# Patient Record
Sex: Male | Born: 1945 | Race: White | Hispanic: No | Marital: Single | Smoking: Former smoker
Health system: Southern US, Community
[De-identification: ages and names within clinical notes are randomized; demographics above are authoritative.]

## PROBLEM LIST (undated history)

## (undated) DIAGNOSIS — K219 Gastro-esophageal reflux disease without esophagitis: Secondary | ICD-10-CM

## (undated) DIAGNOSIS — R188 Other ascites: Secondary | ICD-10-CM

## (undated) DIAGNOSIS — I214 Non-ST elevation (NSTEMI) myocardial infarction: Secondary | ICD-10-CM

## (undated) DIAGNOSIS — I639 Cerebral infarction, unspecified: Secondary | ICD-10-CM

## (undated) DIAGNOSIS — I10 Essential (primary) hypertension: Secondary | ICD-10-CM

## (undated) DIAGNOSIS — Z8489 Family history of other specified conditions: Secondary | ICD-10-CM

## (undated) DIAGNOSIS — N529 Male erectile dysfunction, unspecified: Secondary | ICD-10-CM

## (undated) DIAGNOSIS — E7800 Pure hypercholesterolemia, unspecified: Secondary | ICD-10-CM

## (undated) DIAGNOSIS — J449 Chronic obstructive pulmonary disease, unspecified: Secondary | ICD-10-CM

## (undated) DIAGNOSIS — K7460 Unspecified cirrhosis of liver: Secondary | ICD-10-CM

## (undated) DIAGNOSIS — T7840XA Allergy, unspecified, initial encounter: Secondary | ICD-10-CM

## (undated) DIAGNOSIS — R7301 Impaired fasting glucose: Secondary | ICD-10-CM

## (undated) DIAGNOSIS — J189 Pneumonia, unspecified organism: Secondary | ICD-10-CM

## (undated) HISTORY — DX: Impaired fasting glucose: R73.01

## (undated) HISTORY — DX: Cerebral infarction, unspecified: I63.9

## (undated) HISTORY — DX: Allergy, unspecified, initial encounter: T78.40XA

## (undated) HISTORY — PX: BACK SURGERY: SHX140

## (undated) HISTORY — DX: Male erectile dysfunction, unspecified: N52.9

## (undated) HISTORY — DX: Essential (primary) hypertension: I10

## (undated) HISTORY — PX: APPENDECTOMY: SHX54

---

## 2007-10-27 HISTORY — PX: COLONOSCOPY: SHX174

## 2009-02-23 LAB — HM COLONOSCOPY

## 2010-10-26 DIAGNOSIS — I639 Cerebral infarction, unspecified: Secondary | ICD-10-CM

## 2010-10-26 HISTORY — DX: Cerebral infarction, unspecified: I63.9

## 2011-10-14 ENCOUNTER — Ambulatory Visit (HOSPITAL_COMMUNITY)
Admission: RE | Admit: 2011-10-14 | Discharge: 2011-10-14 | Disposition: A | Discharge status: Home / Self Care | Payer: PRIVATE HEALTH INSURANCE | Source: Ambulatory Visit | Attending: Family Medicine | Admitting: Family Medicine

## 2011-10-14 DIAGNOSIS — IMO0001 Reserved for inherently not codable concepts without codable children: Secondary | ICD-10-CM | POA: Insufficient documentation

## 2011-10-14 DIAGNOSIS — R269 Unspecified abnormalities of gait and mobility: Secondary | ICD-10-CM | POA: Insufficient documentation

## 2011-10-14 DIAGNOSIS — M6281 Muscle weakness (generalized): Secondary | ICD-10-CM | POA: Insufficient documentation

## 2011-10-14 NOTE — Progress Notes (Signed)
Physical Therapy Evaluation  Patient Details  Name: Mitchell Oliver MRN: [id number] Date of Birth: 10-16-1946  Today's Date: 10/14/2011 Time: 1015-1038 Time Calculation (min): 23 min Visit#: 1  of 1   Re-eval:      Past Medical History: No past medical history on file. Past Surgical History: No past surgical history on file.  Subjective Symptoms/Limitations Symptoms: The patient had a stroke on Dec. 6th.  The patient states that he was in the hospital for one night.  The patient states that he is not having any difficulty doing anything at  home. How long can you sit comfortably?: no problem How long can you stand comfortably?: no problem How long can you walk comfortably?: no problem Pain Assessment Currently in Pain?: No/denies  Precautions/Restrictions     Prior Functioning  Prior Function Able to Take Stairs?: Reciprically Driving: Yes Vocation: Full time employment Vocation Requirements: sales travels  Sensation/Coordination/Flexibility    Assessment RLE Strength Right Hip Flexion: 5/5 Right Hip Extension: 5/5 Right Hip ABduction: 5/5 Right Hip ADduction: 5/5 Right Knee Flexion: 5/5 Right Knee Extension: 5/5 Right Ankle Dorsiflexion: 5/5 LLE Strength Left Hip Flexion: 5/5 Left Hip Extension: 5/5 Left Hip ABduction: 5/5 Left Hip ADduction: 5/5 Left Knee Flexion: 5/5 Left Knee Extension: 5/5 Left Ankle Dorsiflexion: 5/5     Physical Therapy Assessment and Plan PT Assessment and Plan Clinical Impression Statement: S/P CVA who does have some high balance deficits.  Pt will do well with HEP for improving balance PT Treatment/Interventions: Patient/family education PT Plan: Pt given high level home exercies program to improve balance.  Pt was instructed that if he did not see any improvement or had any questions he was to call the clinic and we would re-assess his HEP.      Problem List There is no problem list on file for this patient.   PT - End of  Session Activity Tolerance: Patient tolerated treatment well General Behavior During Session: [facility] for tasks performed   RUSSELL,CINDY 10/14/2011, 10:39 AM  Physician Documentation Your signature is required to indicate approval of the treatment plan as stated above.  Please sign and either send electronically or make a copy of this report for your files and return this physician signed original.   Please mark one 1.__approve of plan  2. ___approve of plan with the following conditions.   ______________________________                                                          _____________________ Physician Signature                                                                                                             Date

## 2011-10-14 NOTE — Patient Instructions (Addendum)
Balance

## 2011-11-05 ENCOUNTER — Ambulatory Visit: Payer: PRIVATE HEALTH INSURANCE | Attending: Family Medicine | Admitting: Sleep Medicine

## 2011-11-05 DIAGNOSIS — Z6832 Body mass index (BMI) 32.0-32.9, adult: Secondary | ICD-10-CM | POA: Insufficient documentation

## 2011-11-05 DIAGNOSIS — G4710 Hypersomnia, unspecified: Secondary | ICD-10-CM | POA: Insufficient documentation

## 2011-11-05 DIAGNOSIS — G4700 Insomnia, unspecified: Secondary | ICD-10-CM

## 2011-11-06 ENCOUNTER — Encounter: Payer: Self-pay | Admitting: Sleep Medicine

## 2011-11-09 NOTE — Procedures (Signed)
NAME:  ATHARVA, MIRSKY              ACCOUNT NO.:  [id number]  MEDICAL RECORD NO.:  [id number]          PATIENT TYPE:  OUT  LOCATION:  SLEEP LAB                     FACILITY:  [facility]  PHYSICIAN:  Kimm Sider A. Gerilyn Pilgrim, M.D. DATE OF BIRTH:  Jan 22, 1946  DATE OF STUDY:  11/05/2011                           NOCTURNAL POLYSOMNOGRAM  REFERRING PHYSICIAN:  Laural Golden  INDICATIONS:  A 66 year old man, who presents with hypersomnia, fatigue, snoring, and witnessed apnea.  The study is being done to evaluate for obstructive sleep apnea syndrome.  MEDICATIONS: 1. Benazepril. 2. Lipitor. 3. Norvasc. 4. Toprol.  EPWORTH SLEEPINESS SCALE: 1. BMI 32.  ARCHITECTURAL SUMMARY:  The total recording time is 439 minutes.  Sleep efficiency is 71%.  Sleep latency 16 minutes.  REM latency 192 minutes. Stage N1 22%, N2 45%, N3 80%, and REM sleep 25%.  RESPIRATORY SUMMARY:  Baseline oxygen saturation is 96, lowest saturation is 90 during non-REM sleep.  Diagnostic AHI is 3.5 and RDI is 4.8.  LIMB MOVEMENT SUMMARY:  PLM index 25.  ELECTROCARDIOGRAM SUMMARY:  Average heart rate is 61.  There is no significant dysrhythmias observed.  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 Otherwise, unremarkable study.  Thanks for this referral.   Markham Dumlao A. Gerilyn Pilgrim, M.D.    KAD/MEDQ  D:  11/08/2011 10:18:46  T:  11/09/2011 05:44:17  Job:  [id number]

## 2013-01-20 ENCOUNTER — Other Ambulatory Visit: Payer: Self-pay | Admitting: Family Medicine

## 2013-01-20 LAB — LIPID PANEL
Cholesterol: 116 mg/dL (ref 0–200)
Triglycerides: 87 mg/dL (ref <150)
VLDL: 17 mg/dL (ref 0–40)

## 2013-01-20 LAB — HEPATIC FUNCTION PANEL
ALT: 37 U/L (ref 0–53)
Indirect Bilirubin: 1 mg/dL — ABNORMAL HIGH (ref 0.0–0.9)
Total Protein: 6.9 g/dL (ref 6.0–8.3)

## 2013-04-14 ENCOUNTER — Encounter: Payer: Self-pay | Admitting: *Deleted

## 2013-04-18 ENCOUNTER — Encounter: Payer: Self-pay | Admitting: Family Medicine

## 2013-04-18 ENCOUNTER — Ambulatory Visit (INDEPENDENT_AMBULATORY_CARE_PROVIDER_SITE_OTHER): Payer: PRIVATE HEALTH INSURANCE | Admitting: Family Medicine

## 2013-04-18 VITALS — BP 140/78 | HR 70 | Wt 267.0 lb

## 2013-04-18 DIAGNOSIS — G5712 Meralgia paresthetica, left lower limb: Secondary | ICD-10-CM | POA: Insufficient documentation

## 2013-04-18 DIAGNOSIS — G5710 Meralgia paresthetica, unspecified lower limb: Secondary | ICD-10-CM

## 2013-04-18 NOTE — Patient Instructions (Addendum)
Meralgia Paresthetica  Meralgia paresthetica (MP) is a disorder characterized by tingling, numbness, and burning pain in the outer side of the thigh. It occurs in men more than women. MP is generally found in middle-aged or overweight people. Sometimes, the disorder may disappear. CAUSES The disorder is caused by a nerve in the thigh being squeezed (compressed). MP may be associated with tight clothing, pregnancy, diabetes, and being overweight (obese). SYMPTOMS  Tingling, numbness, and burning in the outer thigh.  An area of the skin may be painful and sensitive to the touch. The symptoms often worsen after walking or standing. TREATMENT  Treatment is based on your symptoms and is mainly supportive. Treatment may include:  Wearing looser clothing.  Losing weight.  Avoiding prolonged standing or walking.  Taking medication.  Surgery if the pain is peristent or severe. MP usually eases or disappears after treatment. Surgery is not always fully successful. Document Released: 10/02/2002 Document Revised: 01/04/2012 Document Reviewed: 10/12/2005 [facility] Patient Information 2014 [place], [place].

## 2013-04-18 NOTE — Progress Notes (Signed)
  Subjective:    Patient ID: Mitchell Oliver, male    DOB: 1946-09-11, 67 y.o.   MRN: [id number]  Back Pain This is a recurrent problem. The current episode started more than 1 year ago. The problem has been waxing and waning since onset. The pain is present in the lumbar spine. The quality of the pain is described as aching. The pain is moderate. The pain is worse during the day. The symptoms are aggravated by bending.   4 yrs ago severe pain locked up. Comes up regularly. No major radiation. Takes occas ibu four tabs prn.  Does some strengthening and stretching exercises intermittently.  Likes to play golf, couple times a week now rarely.   Review of Systems  Musculoskeletal: Positive for back pain.       Objective:   Physical Exam  Alert no acute distress. Lungs clear. Heart regular rate and rhythm. HEENT normal. Low back some tenderness in deep palpation negative straight leg raise. Left anterior thigh distinct patch of paresthesias. More on the lateral side.     Assessment & Plan:  Impression 1 neuralgia paresthetica discussed at length. Nature of condition discussed. Not related to sciatic nerve. Plan avoidance measures discussed. We'll followup on blood work in March. Easily 25 minutes spent most in discussion. WSL

## 2013-04-19 ENCOUNTER — Telehealth: Payer: Self-pay | Admitting: *Deleted

## 2013-04-19 NOTE — Telephone Encounter (Signed)
Left message on pt vm.  Lipids looked good, liver is fine, glucose slightly elevated per Dr. Brett Canales

## 2013-06-22 ENCOUNTER — Encounter: Payer: Self-pay | Admitting: Family Medicine

## 2013-06-22 ENCOUNTER — Ambulatory Visit (INDEPENDENT_AMBULATORY_CARE_PROVIDER_SITE_OTHER): Payer: PRIVATE HEALTH INSURANCE | Admitting: Family Medicine

## 2013-06-22 VITALS — BP 122/78 | Ht 71.5 in | Wt 265.6 lb

## 2013-06-22 DIAGNOSIS — N529 Male erectile dysfunction, unspecified: Secondary | ICD-10-CM

## 2013-06-22 DIAGNOSIS — I6350 Cerebral infarction due to unspecified occlusion or stenosis of unspecified cerebral artery: Secondary | ICD-10-CM

## 2013-06-22 DIAGNOSIS — I10 Essential (primary) hypertension: Secondary | ICD-10-CM

## 2013-06-22 DIAGNOSIS — E1159 Type 2 diabetes mellitus with other circulatory complications: Secondary | ICD-10-CM | POA: Insufficient documentation

## 2013-06-22 DIAGNOSIS — Z0000 Encounter for general adult medical examination without abnormal findings: Secondary | ICD-10-CM

## 2013-06-22 DIAGNOSIS — R7301 Impaired fasting glucose: Secondary | ICD-10-CM

## 2013-06-22 DIAGNOSIS — Z125 Encounter for screening for malignant neoplasm of prostate: Secondary | ICD-10-CM

## 2013-06-22 DIAGNOSIS — I639 Cerebral infarction, unspecified: Secondary | ICD-10-CM

## 2013-06-22 DIAGNOSIS — Z79899 Other long term (current) drug therapy: Secondary | ICD-10-CM

## 2013-06-22 DIAGNOSIS — E785 Hyperlipidemia, unspecified: Secondary | ICD-10-CM | POA: Insufficient documentation

## 2013-06-22 DIAGNOSIS — E119 Type 2 diabetes mellitus without complications: Secondary | ICD-10-CM | POA: Insufficient documentation

## 2013-06-22 DIAGNOSIS — Z8673 Personal history of transient ischemic attack (TIA), and cerebral infarction without residual deficits: Secondary | ICD-10-CM | POA: Insufficient documentation

## 2013-06-22 MED ORDER — VARDENAFIL HCL 20 MG PO TABS: 20.0000 mg | ORAL_TABLET | Freq: Every day | ORAL | Status: DC | PRN

## 2013-06-22 MED ORDER — AMLODIPINE BESYLATE 10 MG PO TABS: 10.0000 mg | ORAL_TABLET | Freq: Every day | ORAL | Status: DC

## 2013-06-22 MED ORDER — METOPROLOL SUCCINATE ER 50 MG PO TB24: 50.0000 mg | ORAL_TABLET | Freq: Every day | ORAL | Status: DC

## 2013-06-22 MED ORDER — ATORVASTATIN CALCIUM 40 MG PO TABS: 40.0000 mg | ORAL_TABLET | Freq: Every day | ORAL | Status: DC

## 2013-06-22 MED ORDER — BENAZEPRIL HCL 20 MG PO TABS: 20.0000 mg | ORAL_TABLET | Freq: Every day | ORAL | Status: DC

## 2013-06-22 NOTE — Progress Notes (Signed)
Zostavax prescription given to patient.

## 2013-06-22 NOTE — Progress Notes (Signed)
  Subjective:    Patient ID: Mitchell Oliver, male    DOB: [birth year], 67 y.o.   MRN: [id number]  HPI Patient arrives for his yearly physical.   Patient states he is having no problems or concerns currently.  2011 colonoscopy neg  No shingles vaccine  Pneum vac last feb    Review of Systems  Constitutional: Negative for fever, activity change and appetite change.  HENT: Negative for congestion, rhinorrhea and neck pain.   Eyes: Negative for discharge.  Respiratory: Negative for cough and wheezing.   Cardiovascular: Negative for chest pain.  Gastrointestinal: Negative for vomiting, abdominal pain and blood in stool.  Genitourinary: Negative for frequency and difficulty urinating.  Skin: Negative for rash.  Allergic/Immunologic: Negative for environmental allergies and food allergies.  Neurological: Negative for weakness and headaches.  Psychiatric/Behavioral: Negative for agitation.   xercise holds its own, watching diet better        Objective:   Physical Exam  Vitals reviewed. Constitutional: He appears well-developed and well-nourished.  HENT:  Head: Normocephalic and atraumatic.  Right Ear: External ear normal.  Left Ear: External ear normal.  Nose: Nose normal.  Mouth/Throat: Oropharynx is clear and moist.  Eyes: EOM are normal. Pupils are equal, round, and reactive to light.  Neck: Normal range of motion. Neck supple. No thyromegaly present.  Cardiovascular: Normal rate, regular rhythm and normal heart sounds.   No murmur heard. Pulmonary/Chest: Effort normal and breath sounds normal. No respiratory distress. He has no wheezes.  Abdominal: Soft. Bowel sounds are normal. He exhibits no distension and no mass. There is no tenderness.  Genitourinary: Penis normal.  Musculoskeletal: Normal range of motion. He exhibits no edema.  Lymphadenopathy:    He has no cervical adenopathy.  Neurological: He is alert. He exhibits normal muscle tone.  Skin: Skin is warm and  dry. No erythema.  Psychiatric: He has a normal mood and affect. His behavior is normal. Judgment normal.          Assessment & Plan:  Impression 1 wellness exam. #2 history of stroke clinically stable. #3 recent meralgia paresthetica much improved. Plan appropriate blood work. Hemoccult cards. Diet exercise discussed. Shingles vaccine. WSL

## 2013-06-23 LAB — BASIC METABOLIC PANEL
BUN: 11 mg/dL (ref 6–23)
CO2: 24 mEq/L (ref 19–32)
Chloride: 102 mEq/L (ref 96–112)
Creat: 0.98 mg/dL (ref 0.50–1.35)
Potassium: 4.8 mEq/L (ref 3.5–5.3)

## 2013-06-23 LAB — HEPATIC FUNCTION PANEL
Albumin: 4.3 g/dL (ref 3.5–5.2)
Alkaline Phosphatase: 35 U/L — ABNORMAL LOW (ref 39–117)
Indirect Bilirubin: 0.8 mg/dL (ref 0.0–0.9)
Total Bilirubin: 1 mg/dL (ref 0.3–1.2)

## 2013-06-23 LAB — LIPID PANEL: LDL Cholesterol: 44 mg/dL (ref 0–99)

## 2013-06-27 ENCOUNTER — Encounter: Payer: Self-pay | Admitting: Family Medicine

## 2013-06-29 ENCOUNTER — Other Ambulatory Visit (INDEPENDENT_AMBULATORY_CARE_PROVIDER_SITE_OTHER): Payer: PRIVATE HEALTH INSURANCE | Admitting: *Deleted

## 2013-06-29 DIAGNOSIS — Z0000 Encounter for general adult medical examination without abnormal findings: Secondary | ICD-10-CM

## 2013-06-29 LAB — POC HEMOCCULT BLD/STL (HOME/3-CARD/SCREEN)

## 2013-10-02 ENCOUNTER — Encounter: Payer: Self-pay | Admitting: Family Medicine

## 2013-10-02 ENCOUNTER — Ambulatory Visit (INDEPENDENT_AMBULATORY_CARE_PROVIDER_SITE_OTHER): Payer: 59 | Admitting: Family Medicine

## 2013-10-02 VITALS — BP 120/72 | Temp 98.6°F | Ht 71.5 in | Wt 266.4 lb

## 2013-10-02 DIAGNOSIS — J310 Chronic rhinitis: Secondary | ICD-10-CM

## 2013-10-02 DIAGNOSIS — J329 Chronic sinusitis, unspecified: Secondary | ICD-10-CM

## 2013-10-02 MED ORDER — CEFUROXIME AXETIL 500 MG PO TABS: 500.0000 mg | ORAL_TABLET | Freq: Two times a day (BID) | ORAL | Status: AC

## 2013-10-02 NOTE — Progress Notes (Signed)
   Subjective:    Patient ID: Mitchell Oliver, male    DOB: [birth year], 67 y.o.   MRN: [id number]  Cough This is a new problem. The current episode started yesterday. Associated symptoms include nasal congestion.   Hit hard yesterday, felt fine. Went home got nasal congestion  Some productive cough, hx freq sinusitis  Took plain mucines DM type Nosebleed, hydrated,  Bronchial coughno sig fever     Review of Systems  Respiratory: Positive for cough.    In no vomiting no diarrhea no rash    Objective:   Physical Exam  Vitals stable ROS otherwise negative HET moderate his congestion frontal tenderness pharynx normal neck supple. Lungs clear heart regular in rhythm.      Assessment & Plan:  Impression 1 acute rhinosinusitis plan Ceftin twice a day 10 days. Symptomatic care discussed. Warning signs discussed. WSL

## 2013-11-17 ENCOUNTER — Other Ambulatory Visit: Payer: Self-pay | Admitting: *Deleted

## 2013-11-17 ENCOUNTER — Telehealth: Payer: Self-pay | Admitting: Family Medicine

## 2013-11-17 DIAGNOSIS — Z79899 Other long term (current) drug therapy: Secondary | ICD-10-CM

## 2013-11-17 DIAGNOSIS — E785 Hyperlipidemia, unspecified: Secondary | ICD-10-CM

## 2013-11-17 NOTE — Telephone Encounter (Signed)
Lip liv 

## 2013-11-17 NOTE — Telephone Encounter (Signed)
Last labs on 8/29: Lip, liv, met 7, and psa

## 2013-11-17 NOTE — Telephone Encounter (Signed)
Patient needs order for blood work. °

## 2013-11-17 NOTE — Telephone Encounter (Signed)
Lab work sent and pt notified

## 2013-11-27 ENCOUNTER — Encounter: Payer: Self-pay | Admitting: Family Medicine

## 2013-11-27 ENCOUNTER — Ambulatory Visit (INDEPENDENT_AMBULATORY_CARE_PROVIDER_SITE_OTHER): Payer: BC Managed Care – PPO | Admitting: Family Medicine

## 2013-11-27 VITALS — BP 128/80 | Ht 73.5 in | Wt 262.5 lb

## 2013-11-27 DIAGNOSIS — R7301 Impaired fasting glucose: Secondary | ICD-10-CM

## 2013-11-27 DIAGNOSIS — I639 Cerebral infarction, unspecified: Secondary | ICD-10-CM

## 2013-11-27 DIAGNOSIS — E785 Hyperlipidemia, unspecified: Secondary | ICD-10-CM

## 2013-11-27 DIAGNOSIS — I10 Essential (primary) hypertension: Secondary | ICD-10-CM

## 2013-11-27 DIAGNOSIS — I6350 Cerebral infarction due to unspecified occlusion or stenosis of unspecified cerebral artery: Secondary | ICD-10-CM

## 2013-11-27 LAB — GLUCOSE, RANDOM: GLUCOSE: 104 mg/dL — AB (ref 70–99)

## 2013-11-27 MED ORDER — BENAZEPRIL HCL 20 MG PO TABS: 20.0000 mg | ORAL_TABLET | Freq: Every day | ORAL | Status: DC

## 2013-11-27 MED ORDER — AMOXICILLIN-POT CLAVULANATE 875-125 MG PO TABS: 1.0000 | ORAL_TABLET | Freq: Two times a day (BID) | ORAL | Status: AC

## 2013-11-27 MED ORDER — ATORVASTATIN CALCIUM 40 MG PO TABS: 40.0000 mg | ORAL_TABLET | Freq: Every day | ORAL | Status: DC

## 2013-11-27 MED ORDER — AMLODIPINE BESYLATE 10 MG PO TABS: 10.0000 mg | ORAL_TABLET | Freq: Every day | ORAL | Status: DC

## 2013-11-27 MED ORDER — METOPROLOL SUCCINATE ER 50 MG PO TB24: 50.0000 mg | ORAL_TABLET | Freq: Every day | ORAL | Status: DC

## 2013-11-27 NOTE — Progress Notes (Signed)
   Subjective:    Patient ID: Mitchell Oliver, male    DOB: [birth year], 68 y.o.   MRN: [id number]  Hypertension This is a chronic problem. The current episode started more than 1 year ago. The problem has been gradually improving since onset. The problem is controlled. There are no associated agents to hypertension. There are no known risk factors for coronary artery disease. Treatments tried: amlodipine. The current treatment provides significant improvement. There are no compliance problems.   Sinusitis This is a new problem. The current episode started 1 to 4 weeks ago. The problem is unchanged. There has been no fever. Associated symptoms include congestion and sinus pressure. Past treatments include spray decongestants. The treatment provided no relief.  sinus cong has recurred, stopped up and gunky  Diminished energy and achey, bloody disch Sticking with chol med, diet good, lost four pounds, eating a lot of fish,  Watching sugar intake also has tried to cut down sugar in his diet.  No further symptoms of TIA or stroke.  BP good when cked elsewhere    Exercising 4 days per wk   Review of Systems  HENT: Positive for congestion and sinus pressure.    no chest pain no back pain no abdominal pain no change in bowel habits no blood in stool ROS otherwise negative     Objective:   Physical Exam Alert no apparent distress H&T moderate his congestion frontal tenderness pharynx normal neck supple. Lungs clear. Heart regular in rhythm. Ankles without edema.       Assessment & Plan:  Impression 1 hypertension good control. #2 hyperlipidemia status uncertain. #3 impaired fasting glucose status uncertain. #4 rhinosinusitis discussed #5 history of stroke no further symptomatology plan diet exercise discussed at length. Appropriate blood work. Medications refilled. Check every 6 months. WSL

## 2013-11-28 LAB — LIPID PANEL
Cholesterol: 113 mg/dL (ref 0–200)
HDL: 43 mg/dL (ref >39)
LDL Cholesterol: 54 mg/dL (ref 0–99)
TRIGLYCERIDES: 80 mg/dL (ref <150)
Total CHOL/HDL Ratio: 2.6 Ratio
VLDL: 16 mg/dL (ref 0–40)

## 2013-11-28 LAB — HEPATIC FUNCTION PANEL
ALBUMIN: 4.6 g/dL (ref 3.5–5.2)
ALK PHOS: 42 U/L (ref 39–117)
ALT: 47 U/L (ref 0–53)
AST: 34 U/L (ref 0–37)
BILIRUBIN INDIRECT: 1.1 mg/dL (ref 0.2–1.2)
BILIRUBIN TOTAL: 1.5 mg/dL — AB (ref 0.2–1.2)
Bilirubin, Direct: 0.4 mg/dL — ABNORMAL HIGH (ref 0.0–0.3)
Total Protein: 6.9 g/dL (ref 6.0–8.3)

## 2013-12-03 ENCOUNTER — Encounter: Payer: Self-pay | Admitting: Family Medicine

## 2014-02-26 ENCOUNTER — Ambulatory Visit (INDEPENDENT_AMBULATORY_CARE_PROVIDER_SITE_OTHER): Payer: BC Managed Care – PPO | Admitting: Family Medicine

## 2014-02-26 ENCOUNTER — Encounter: Payer: Self-pay | Admitting: Family Medicine

## 2014-02-26 VITALS — BP 130/82 | Ht 71.5 in | Wt 263.6 lb

## 2014-02-26 DIAGNOSIS — R1310 Dysphagia, unspecified: Secondary | ICD-10-CM

## 2014-02-26 MED ORDER — PANTOPRAZOLE SODIUM 40 MG PO TBEC: 40.0000 mg | DELAYED_RELEASE_TABLET | Freq: Every day | ORAL | Status: DC

## 2014-02-26 NOTE — Progress Notes (Signed)
   Subjective:    Patient ID: Mitchell Oliver, male    DOB: 07/21/1946, 68 y.o.   MRN: [id number]  Hypertension This is a chronic problem. The current episode started more than 1 year ago. Risk factors for coronary artery disease include dyslipidemia. There are no compliance problems.    Patient states he is having problems with heart burn and feels like food gets stuck if he eats too fast.sits and swaits. Bread gets stuck. Has to wait.  No vomiting, but has to stop eating.  No hx of reflux.  Happening one rtwo times per mo  Disconcerting to the pt,  Day numg [phone]   Review of Systems No chest pain no abdominal pain no change in bowel habits no blood in stool no history of ulcer history of reflux    Objective:   Physical Exam  Alert no apparent distress. Lungs clear. Heart rare rhythm. H&T normal. Abdomen benign.      Assessment & Plan:  Impression 1 solid food dysphagia discussed at great length. Plan initiate protonic. GI referral. Warning signs discussed. Maintain other medications. Recheck in September. For preventative checkup

## 2014-02-27 ENCOUNTER — Encounter: Payer: Self-pay | Admitting: Gastroenterology

## 2014-04-04 ENCOUNTER — Ambulatory Visit: Payer: PRIVATE HEALTH INSURANCE | Admitting: Gastroenterology

## 2014-04-11 ENCOUNTER — Ambulatory Visit (INDEPENDENT_AMBULATORY_CARE_PROVIDER_SITE_OTHER): Payer: BC Managed Care – PPO | Admitting: Gastroenterology

## 2014-04-11 ENCOUNTER — Telehealth: Payer: Self-pay | Admitting: Gastroenterology

## 2014-04-11 ENCOUNTER — Encounter (INDEPENDENT_AMBULATORY_CARE_PROVIDER_SITE_OTHER): Payer: Self-pay

## 2014-04-11 ENCOUNTER — Other Ambulatory Visit: Payer: Self-pay | Admitting: Gastroenterology

## 2014-04-11 ENCOUNTER — Encounter: Payer: Self-pay | Admitting: Gastroenterology

## 2014-04-11 VITALS — BP 125/72 | HR 50 | Temp 97.3°F | Resp 20 | Ht 74.0 in | Wt 267.8 lb

## 2014-04-11 DIAGNOSIS — R1013 Epigastric pain: Secondary | ICD-10-CM

## 2014-04-11 DIAGNOSIS — R1310 Dysphagia, unspecified: Secondary | ICD-10-CM

## 2014-04-11 MED ORDER — PANTOPRAZOLE SODIUM 40 MG PO TBEC: 40.0000 mg | DELAYED_RELEASE_TABLET | Freq: Every day | ORAL | Status: DC

## 2014-04-11 NOTE — Assessment & Plan Note (Signed)
SOLID AND IMPROVED WITH PPI.  EGD/?DIL TO ASESS NEW ONSET DYSPHAGIA/DYSPAPESIA PROTONIX QD LOW FAT DIET LOSE WEIGHT FOLLOW UP IN 6 MOS.

## 2014-04-11 NOTE — Progress Notes (Signed)
cc'd to pcp 

## 2014-04-11 NOTE — Patient Instructions (Signed)
ENDOSCOPY WITH POSSIBLE ESOPHAGEAL DILATION IN 2-3 WEEKS   CONTINUE Emily. TAKE 30 MINUTES PRIOR TO BREAKFAST.  FOLLOW A LOW FAT DIET. SEE INFO BELOW.  CONTINUE YOUR WEIGHT LOSS EFFORTS.  FOLLOW UP IN 6 MOS.   Low-Fat Diet BREADS, CEREALS, PASTA, RICE, DRIED PEAS, AND BEANS These products are high in carbohydrates and most are low in fat. Therefore, they can be increased in the diet as substitutes for fatty foods. They too, however, contain calories and should not be eaten in excess. Cereals can be eaten for snacks as well as for breakfast.   FRUITS AND VEGETABLES It is good to eat fruits and vegetables. Besides being sources of fiber, both are rich in vitamins and some minerals. They help you get the daily allowances of these nutrients. Fruits and vegetables can be used for snacks and desserts.  MEATS Limit lean meat, chicken, [place], and fish to no more than 6 ounces per day. Beef, Pork, and Lamb Use lean cuts of beef, pork, and lamb. Lean cuts include:  Extra-lean ground beef.  Arm roast.  Sirloin tip.  Center-cut ham.  Round steak.  Loin chops.  Rump roast.  Tenderloin.  Trim all fat off the outside of meats before cooking. It is not necessary to severely decrease the intake of red meat, but lean choices should be made. Lean meat is rich in protein and contains a highly absorbable form of iron. Premenopausal women, in particular, should avoid reducing lean red meat because this could increase the risk for low red blood cells (iron-deficiency anemia).  Chicken and [place] These are good sources of protein. The fat of poultry can be reduced by removing the skin and underlying fat layers before cooking. Chicken and [place] can be substituted for lean red meat in the diet. Poultry should not be fried or covered with high-fat sauces. Fish and Shellfish Fish is a good source of protein. Shellfish contain cholesterol, but they usually are low in saturated fatty acids. The preparation  of fish is important. Like chicken and [place], they should not be fried or covered with high-fat sauces. EGGS Egg whites contain no fat or cholesterol. They can be eaten often. Try 1 to 2 egg whites instead of whole eggs in recipes or use egg substitutes that do not contain yolk. MILK AND DAIRY PRODUCTS Use skim or 1% milk instead of 2% or whole milk. Decrease whole milk, natural, and processed cheeses. Use nonfat or low-fat (2%) cottage cheese or low-fat cheeses made from vegetable oils. Choose nonfat or low-fat (1 to 2%) yogurt. Experiment with evaporated skim milk in recipes that call for heavy cream. Substitute low-fat yogurt or low-fat cottage cheese for sour cream in dips and salad dressings. Have at least 2 servings of low-fat dairy products, such as 2 glasses of skim (or 1%) milk each day to help get your daily calcium intake. FATS AND OILS Reduce the total intake of fats, especially saturated fat. Butterfat, lard, and beef fats are high in saturated fat and cholesterol. These should be avoided as much as possible. Vegetable fats do not contain cholesterol, but certain vegetable fats, such as coconut oil, palm oil, and palm kernel oil are very high in saturated fats. These should be limited. These fats are often used in bakery goods, processed foods, popcorn, oils, and nondairy creamers. Vegetable shortenings and some peanut butters contain hydrogenated oils, which are also saturated fats. Read the labels on these foods and check for saturated vegetable oils. Unsaturated vegetable oils and fats  do not raise blood cholesterol. However, they should be limited because they are fats and are high in calories. Total fat should still be limited to 30% of your daily caloric intake. Desirable liquid vegetable oils are corn oil, cottonseed oil, olive oil, canola oil, safflower oil, soybean oil, and sunflower oil. Peanut oil is not as good, but small amounts are acceptable. Buy a heart-healthy tub margarine that  has no partially hydrogenated oils in the ingredients. Mayonnaise and salad dressings often are made from unsaturated fats, but they should also be limited because of their high calorie and fat content. Seeds, nuts, peanut butter, olives, and avocados are high in fat, but the fat is mainly the unsaturated type. These foods should be limited mainly to avoid excess calories and fat. OTHER EATING TIPS Snacks  Most sweets should be limited as snacks. They tend to be rich in calories and fats, and their caloric content outweighs their nutritional value. Some good choices in snacks are graham crackers, melba toast, soda crackers, bagels (no egg), English muffins, fruits, and vegetables. These snacks are preferable to snack crackers, [place] fries, TORTILLA CHIPS, and POTATO chips. Popcorn should be air-popped or cooked in small amounts of liquid vegetable oil. Desserts Eat fruit, low-fat yogurt, and fruit ices instead of pastries, cake, and cookies. Sherbet, angel food cake, gelatin dessert, frozen low-fat yogurt, or other frozen products that do not contain saturated fat (pure fruit juice bars, frozen ice pops) are also acceptable.  COOKING METHODS Choose those methods that use little or no fat. They include: Poaching.  Braising.  Steaming.  Grilling.  Baking.  Stir-frying.  Broiling.  Microwaving.  Foods can be cooked in a nonstick pan without added fat, or use a nonfat cooking spray in regular cookware. Limit fried foods and avoid frying in saturated fat. Add moisture to lean meats by using water, broth, cooking wines, and other nonfat or low-fat sauces along with the cooking methods mentioned above. Soups and stews should be chilled after cooking. The fat that forms on top after a few hours in the refrigerator should be skimmed off. When preparing meals, avoid using excess salt. Salt can contribute to raising blood pressure in some people.  EATING AWAY FROM HOME Order entres, potatoes, and  vegetables without sauces or butter. When meat exceeds the size of a deck of cards (3 to 4 ounces), the rest can be taken home for another meal. Choose vegetable or fruit salads and ask for low-calorie salad dressings to be served on the side. Use dressings sparingly. Limit high-fat toppings, such as bacon, crumbled eggs, cheese, sunflower seeds, and olives. Ask for heart-healthy tub margarine instead of butter.

## 2014-04-11 NOTE — Telephone Encounter (Signed)
I called the pharmacist and was told that the prescription was sent in but he last got it on 03/24/2014 and it is too early. I called and told the pt and he expressed understanding.

## 2014-04-11 NOTE — Telephone Encounter (Signed)
Pt was seen by SF this morning. He called this afternoon to let us know that his rx was not at his pharmacy ([facility]) and would we please send it in for him.

## 2014-04-11 NOTE — Progress Notes (Signed)
   Subjective:    Patient ID: Mitchell Oliver, male    DOB: 1946-03-15, 68 y.o.   MRN: [id number] Mitchell Battiest, MD  HPI Not feeling heartburn until food feeling like it's getting hung. Sx for 3 mos. No weight loss. Sx better with Rx: PROTONIX meds 1.5 mos ago. PT DENIES FEVER, CHILLS, HEMATOCHEZIA, nausea, vomiting, melena, diarrhea, CHEST PAIN, SHORTNESS OF BREATH,  CHANGE IN BOWEL IN HABITS, constipation, abdominal pain, OR problems with sedation.    Past Medical History  Diagnosis Date  . Hypertension   . Stroke   . Allergy   . IFG (impaired fasting glucose)   . ED (erectile dysfunction)   . CVA (cerebral vascular accident)     Past Surgical History  Procedure Laterality Date  . Colonoscopy     No Known Allergies  Current Outpatient Prescriptions  Medication Sig Dispense Refill  . amLODipine (NORVASC) 10 MG tablet Take 1 tablet (10 mg total) by mouth daily.    . Ascorbic Acid (VITAMIN C PO) Take by mouth.    Marland Kitchen atorvastatin (LIPITOR) 40 MG tablet Take 1 tablet (40 mg total) by mouth daily.    . benazepril (LOTENSIN) 20 MG tablet Take 1 tablet (20 mg total) by mouth daily.    . Cholecalciferol (VITAMIN D PO) Take by mouth.    . fish oil-omega-3 fatty acids 1000 MG capsule Take 2 g by mouth daily.    . metoprolol succinate (TOPROL-XL) 50 MG 24 hr tablet Take 1 tablet (50 mg total) by mouth daily. Take with or immediately following a meal.    . pantoprazole (PROTONIX) 40 MG tablet Take 1 tablet (40 mg total) by mouth daily before breakfast.    . Saw Palmetto, Serenoa repens, (SAW PALMETTO PO) Take by mouth.    . vardenafil (LEVITRA) 20 MG tablet Take 1 tablet (20 mg total) by mouth daily as needed for erectile dysfunction.     Family History  Problem Relation Age of Onset  . Hypertension Mother   . Hypertension Father   . Colon cancer Neg Hx   . Colon polyps Neg Hx     History  Substance Use Topics  . Smoking status: Former Research scientist (life sciences)  . Smokeless tobacco: Not on file  .  Alcohol Use: Not on file       Review of Systems PER HPI OTHERWISE ALL SYSTEMS ARE NEGATIVE.     Objective:   Physical Exam  Vitals reviewed. Constitutional: He is oriented to person, place, and time. He appears well-developed and well-nourished. No distress.  HENT:  Head: Normocephalic and atraumatic.  Mouth/Throat: Oropharynx is clear and moist. No oropharyngeal exudate.  Eyes: Pupils are equal, round, and reactive to light. No scleral icterus.  Neck: Normal range of motion. Neck supple.  Cardiovascular: Normal rate, regular rhythm and normal heart sounds.   Pulmonary/Chest: Effort normal and breath sounds normal. No respiratory distress.  Abdominal: Soft. Bowel sounds are normal. He exhibits no distension. There is no tenderness.  Musculoskeletal: He exhibits edema (TRACE BIL LE).  Lymphadenopathy:    He has no cervical adenopathy.  Neurological: He is alert and oriented to person, place, and time.  Psychiatric: He has a normal mood and affect.          Assessment & Plan:

## 2014-04-11 NOTE — Progress Notes (Signed)
Reminder in epic °

## 2014-04-24 ENCOUNTER — Encounter (HOSPITAL_COMMUNITY): Payer: Self-pay | Admitting: Pharmacy Technician

## 2014-05-04 ENCOUNTER — Encounter (HOSPITAL_COMMUNITY): Payer: Self-pay | Admitting: *Deleted

## 2014-05-04 ENCOUNTER — Encounter (HOSPITAL_COMMUNITY)
Admission: RE | Disposition: A | Discharge status: Home Health | Payer: Self-pay | Source: Ambulatory Visit | Attending: Gastroenterology

## 2014-05-04 ENCOUNTER — Ambulatory Visit (HOSPITAL_COMMUNITY)
Admission: RE | Admit: 2014-05-04 | Discharge: 2014-05-04 | Disposition: A | Discharge status: Home Health | Payer: BC Managed Care – PPO | Source: Ambulatory Visit | Attending: Gastroenterology | Admitting: Gastroenterology

## 2014-05-04 DIAGNOSIS — K2990 Gastroduodenitis, unspecified, without bleeding: Principal | ICD-10-CM

## 2014-05-04 DIAGNOSIS — Z79899 Other long term (current) drug therapy: Secondary | ICD-10-CM | POA: Insufficient documentation

## 2014-05-04 DIAGNOSIS — K2970 Gastritis, unspecified, without bleeding: Secondary | ICD-10-CM | POA: Insufficient documentation

## 2014-05-04 DIAGNOSIS — R1013 Epigastric pain: Secondary | ICD-10-CM

## 2014-05-04 DIAGNOSIS — K222 Esophageal obstruction: Secondary | ICD-10-CM

## 2014-05-04 DIAGNOSIS — R1310 Dysphagia, unspecified: Secondary | ICD-10-CM

## 2014-05-04 DIAGNOSIS — I10 Essential (primary) hypertension: Secondary | ICD-10-CM | POA: Insufficient documentation

## 2014-05-04 DIAGNOSIS — K3189 Other diseases of stomach and duodenum: Secondary | ICD-10-CM

## 2014-05-04 DIAGNOSIS — Z87891 Personal history of nicotine dependence: Secondary | ICD-10-CM | POA: Insufficient documentation

## 2014-05-04 HISTORY — PX: SAVORY DILATION: SHX5439

## 2014-05-04 HISTORY — PX: ESOPHAGOGASTRODUODENOSCOPY: SHX5428

## 2014-05-04 HISTORY — PX: BIOPSY: SHX5522

## 2014-05-04 SURGERY — EGD (ESOPHAGOGASTRODUODENOSCOPY)
Anesthesia: Moderate Sedation

## 2014-05-04 MED ORDER — LIDOCAINE VISCOUS 2 % MT SOLN
OROMUCOSAL | Status: AC
  Filled 2014-05-04: qty 15

## 2014-05-04 MED ORDER — MIDAZOLAM HCL 5 MG/5ML IJ SOLN
INTRAMUSCULAR | Status: AC
  Filled 2014-05-04: qty 10

## 2014-05-04 MED ORDER — MINERAL OIL PO OIL
TOPICAL_OIL | ORAL | Status: AC
  Filled 2014-05-04: qty 30

## 2014-05-04 MED ORDER — STERILE WATER FOR IRRIGATION IR SOLN
Status: DC | PRN
  Administered 2014-05-04: 09:00:00

## 2014-05-04 MED ORDER — LIDOCAINE VISCOUS 2 % MT SOLN
OROMUCOSAL | Status: DC | PRN
  Administered 2014-05-04: 4 mL via OROMUCOSAL

## 2014-05-04 MED ORDER — MEPERIDINE HCL 100 MG/ML IJ SOLN
INTRAMUSCULAR | Status: AC
  Filled 2014-05-04: qty 2

## 2014-05-04 MED ORDER — MEPERIDINE HCL 100 MG/ML IJ SOLN
INTRAMUSCULAR | Status: DC | PRN
  Administered 2014-05-04: 25 mg via INTRAVENOUS
  Administered 2014-05-04: 50 mg via INTRAVENOUS

## 2014-05-04 MED ORDER — MIDAZOLAM HCL 5 MG/5ML IJ SOLN
INTRAMUSCULAR | Status: DC | PRN
  Administered 2014-05-04 (×2): 2 mg via INTRAVENOUS

## 2014-05-04 MED ORDER — SODIUM CHLORIDE 0.9 % IV SOLN
INTRAVENOUS | Status: DC
  Administered 2014-05-04: 1000 mL via INTRAVENOUS

## 2014-05-04 NOTE — Interval H&P Note (Signed)
History and Physical Interval Note:  05/04/2014 9:20 AM  [place]  has presented today for surgery, with the diagnosis of DYSPEPSIA , NEW ONSET DYSPHAGIA  The various methods of treatment have been discussed with the patient and family. After consideration of risks, benefits and other options for treatment, the patient has consented to  Procedure(s) with comments: ESOPHAGOGASTRODUODENOSCOPY (EGD) (N/A) - 9:15 MALONEY DILATION (N/A) SAVORY DILATION (N/A) as a surgical intervention .  The patient's history has been reviewed, patient examined, no change in status, stable for surgery.  I have reviewed the patient's chart and labs.  Questions were answered to the patient's satisfaction.     [employer]

## 2014-05-04 NOTE — Discharge Instructions (Signed)
I dilated your esophagus. 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  YOU DO NOT HAVE BARRETT'S ESOPHAGUS. You have gastritis MOST LIKELY DUE TO ASPIRIN. I biopsied your stomach.   CONTINUE PROTONIX. TAKE 30 MINUTES PRIOR TO BREAKFAST FOREVER.  FOLLOW A LOW FAT DIET. SEE INFO BELOW.  YOUR BIOPSY WILL BE BACK IN 7 DAYS  FOLLOW UP IN 3 MOS.  October 15 at [facility] the instructions outlined below and refer to this sheet in the next week. These discharge instructions provide you with general information on caring for yourself after you leave the hospital. While your treatment has been planned according to the most current medical practices available, unavoidable complications occasionally occur. If you have any problems or questions after discharge, call DR. FIELDS, [phone].  ACTIVITY  You may resume your regular activity, but move at a slower pace for the next 24 hours.   Take frequent rest periods for the next 24 hours.   Walking will help get rid of the air and reduce the bloated feeling in your belly (abdomen).   No driving for 24 hours (because of the medicine (anesthesia) used during the test).   You may shower.   Do not sign any important legal documents or operate any machinery for 24 hours (because of the anesthesia used during the test).    NUTRITION  Drink plenty of fluids.   You may resume your normal diet as instructed by your doctor.   Begin with a light meal and progress to your normal diet. Heavy or fried foods are harder to digest and may make you feel sick to your stomach (nauseated).   Avoid alcoholic beverages for 24 hours or as instructed.    MEDICATIONS  You may resume your normal medications.   WHAT YOU CAN EXPECT TODAY  Some feelings of bloating in the abdomen.   Passage of more gas than usual.    IF YOU HAD A BIOPSY TAKEN DURING THE UPPER ENDOSCOPY:  Eat a soft diet  IF YOU HAVE NAUSEA, BLOATING, ABDOMINAL PAIN, OR VOMITING.    FINDING OUT THE RESULTS OF YOUR TEST Not all test results are available during your visit. DR. Oneida Alar WILL CALL YOU WITHIN 7 DAYS OF YOUR PROCEDUE WITH YOUR RESULTS. Do not assume everything is normal if you have not heard from DR. FIELDS IN ONE WEEK, CALL HER OFFICE AT [phone].  SEEK IMMEDIATE MEDICAL ATTENTION AND CALL THE OFFICE: [phone] IF:  You have more than a spotting of blood in your stool.   Your belly is swollen (abdominal distention).   You are nauseated or vomiting.   You have a temperature over 101F.   You have abdominal pain or discomfort that is severe or gets worse throughout the day.  Gastritis  Gastritis is an inflammation (the body's way of reacting to injury and/or infection) of the stomach. It is often caused by viral or bacterial (germ) infections. It can also be caused BY ASPIRIN, BC/GOODY POWDER'S, (IBUPROFEN) MOTRIN, OR ALEVE (NAPROXEN), chemicals (including alcohol), SPICY FOODS, and medications. This illness may be associated with generalized malaise (feeling tired, not well), UPPER ABDOMINAL STOMACH cramps, and fever. One common bacterial cause of gastritis is an organism known as H. Pylori. This can be treated with antibiotics.   ESOPHAGEAL STRICTURE  Esophageal strictures can be caused by stomach acid backing up into the tube that carries food from the mouth down to the stomach (lower  esophagus).  TREATMENT There are a number of medicines used to treat reflux/stricture, including: Antacids.  Proton-pump inhibitors:PROTONIX  HOME CARE INSTRUCTIONS Eat 2-3 hours before going to bed.  Try to reach and maintain a healthy weight.  Do not eat just a few very large meals. Instead, eat 4 TO 6 smaller meals throughout the day.  Try to identify foods and beverages that make your symptoms worse, and avoid these.  Avoid tight clothing.  Do not exercise right after eating.   Low-Fat  Diet BREADS, CEREALS, PASTA, RICE, DRIED PEAS, AND BEANS These products are high in carbohydrates and most are low in fat. Therefore, they can be increased in the diet as substitutes for fatty foods. They too, however, contain calories and should not be eaten in excess. Cereals can be eaten for snacks as well as for breakfast.   FRUITS AND VEGETABLES It is good to eat fruits and vegetables. Besides being sources of fiber, both are rich in vitamins and some minerals. They help you get the daily allowances of these nutrients. Fruits and vegetables can be used for snacks and desserts.  MEATS Limit lean meat, chicken, [place], and fish to no more than 6 ounces per day. Beef, Pork, and Lamb Use lean cuts of beef, pork, and lamb. Lean cuts include:  Extra-lean ground beef.  Arm roast.  Sirloin tip.  Center-cut ham.  Round steak.  Loin chops.  Rump roast.  Tenderloin.  Trim all fat off the outside of meats before cooking. It is not necessary to severely decrease the intake of red meat, but lean choices should be made. Lean meat is rich in protein and contains a highly absorbable form of iron. Premenopausal women, in particular, should avoid reducing lean red meat because this could increase the risk for low red blood cells (iron-deficiency anemia).  Chicken and [place] These are good sources of protein. The fat of poultry can be reduced by removing the skin and underlying fat layers before cooking. Chicken and [place] can be substituted for lean red meat in the diet. Poultry should not be fried or covered with high-fat sauces. Fish and Shellfish Fish is a good source of protein. Shellfish contain cholesterol, but they usually are low in saturated fatty acids. The preparation of fish is important. Like chicken and [place], they should not be fried or covered with high-fat sauces. EGGS Egg whites contain no fat or cholesterol. They can be eaten often. Try 1 to 2 egg whites instead of whole eggs in  recipes or use egg substitutes that do not contain yolk. MILK AND DAIRY PRODUCTS Use skim or 1% milk instead of 2% or whole milk. Decrease whole milk, natural, and processed cheeses. Use nonfat or low-fat (2%) cottage cheese or low-fat cheeses made from vegetable oils. Choose nonfat or low-fat (1 to 2%) yogurt. Experiment with evaporated skim milk in recipes that call for heavy cream. Substitute low-fat yogurt or low-fat cottage cheese for sour cream in dips and salad dressings. Have at least 2 servings of low-fat dairy products, such as 2 glasses of skim (or 1%) milk each day to help get your daily calcium intake. FATS AND OILS Reduce the total intake of fats, especially saturated fat. Butterfat, lard, and beef fats are high in saturated fat and cholesterol. These should be avoided as much as possible. Vegetable fats do not contain cholesterol, but certain vegetable fats, such as coconut oil, palm oil, and palm kernel oil are very high in saturated fats. These should be limited.  These fats are often used in bakery goods, processed foods, popcorn, oils, and nondairy creamers. Vegetable shortenings and some peanut butters contain hydrogenated oils, which are also saturated fats. Read the labels on these foods and check for saturated vegetable oils. Unsaturated vegetable oils and fats do not raise blood cholesterol. However, they should be limited because they are fats and are high in calories. Total fat should still be limited to 30% of your daily caloric intake. Desirable liquid vegetable oils are corn oil, cottonseed oil, olive oil, canola oil, safflower oil, soybean oil, and sunflower oil. Peanut oil is not as good, but small amounts are acceptable. Buy a heart-healthy tub margarine that has no partially hydrogenated oils in the ingredients. Mayonnaise and salad dressings often are made from unsaturated fats, but they should also be limited because of their high calorie and fat content. Seeds, nuts, peanut  butter, olives, and avocados are high in fat, but the fat is mainly the unsaturated type. These foods should be limited mainly to avoid excess calories and fat. OTHER EATING TIPS Snacks  Most sweets should be limited as snacks. They tend to be rich in calories and fats, and their caloric content outweighs their nutritional value. Some good choices in snacks are graham crackers, melba toast, soda crackers, bagels (no egg), English muffins, fruits, and vegetables. These snacks are preferable to snack crackers, [place] fries, TORTILLA CHIPS, and POTATO chips. Popcorn should be air-popped or cooked in small amounts of liquid vegetable oil. Desserts Eat fruit, low-fat yogurt, and fruit ices instead of pastries, cake, and cookies. Sherbet, angel food cake, gelatin dessert, frozen low-fat yogurt, or other frozen products that do not contain saturated fat (pure fruit juice bars, frozen ice pops) are also acceptable.  COOKING METHODS Choose those methods that use little or no fat. They include: Poaching.  Braising.  Steaming.  Grilling.  Baking.  Stir-frying.  Broiling.  Microwaving.  Foods can be cooked in a nonstick pan without added fat, or use a nonfat cooking spray in regular cookware. Limit fried foods and avoid frying in saturated fat. Add moisture to lean meats by using water, broth, cooking wines, and other nonfat or low-fat sauces along with the cooking methods mentioned above. Soups and stews should be chilled after cooking. The fat that forms on top after a few hours in the refrigerator should be skimmed off. When preparing meals, avoid using excess salt. Salt can contribute to raising blood pressure in some people.  EATING AWAY FROM HOME Order entres, potatoes, and vegetables without sauces or butter. When meat exceeds the size of a deck of cards (3 to 4 ounces), the rest can be taken home for another meal. Choose vegetable or fruit salads and ask for low-calorie salad dressings to be served  on the side. Use dressings sparingly. Limit high-fat toppings, such as bacon, crumbled eggs, cheese, sunflower seeds, and olives. Ask for heart-healthy tub margarine instead of butter.

## 2014-05-04 NOTE — Op Note (Signed)
[facility] [address] [place], [address]   ENDOSCOPY PROCEDURE REPORT  PATIENT: Mitchell, Oliver  MR#: [id number] BIRTHDATE: [birth year] , 62  yrs. old GENDER: Male  ENDOSCOPIST: Barney Drain, MD REFFERED ER:[id number] [name], M.D.  PROCEDURE DATE:  05/04/2014 PROCEDURE:   EGD with biopsy and EGD with dilatation over guidewire   INDICATIONS:1.  dyspepsia.   2.  dysphagia. MEDICATIONS: Demerol 75 mg IV and Versed 4 mg IV TOPICAL ANESTHETIC: Viscous Xylocaine  DESCRIPTION OF PROCEDURE:   After the risks benefits and alternatives of the procedure were thoroughly explained, informed consent was obtained.  The EG-[id number] ([id number])  endoscope was introduced through the mouth and advanced to the second portion of the duodenum. The instrument was slowly withdrawn as the mucosa was carefully examined.  Prior to withdrawal of the scope, the guidwire was placed.  The esophagus was dilated successfully.  The patient was recovered in endoscopy and discharged home in satisfactory condition.   ESOPHAGUS: A stricture was found at the gastroesophageal junction. The stenosis was traversable with the endoscope.   NO BARRETT'S. STOMACH: Moderate non-erosive gastritis (inflammation) was found in the gastric antrum.  Multiple biopsies were performed using cold forceps.   DUODENUM: The duodenal mucosa showed no abnormalities in the bulb and second portion of the duodenum.   Dilation was then performed at the gastroesphageal junction Dilator: Savary over guidewire Size(s): 14-16 MM Resistance: moderate Heme: none  COMPLICATIONS: There were no complications.  ENDOSCOPIC IMPRESSION: 1.   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   DYSPEPSIA DUE TO MILD Non-erosive gastritis  RECOMMENDATIONS: CONTINUE PROTONIX.  TAKE 30 MINUTES PRIOR TO BREAKFAST FOREVER. FOLLOW A LOW FAT DIET. BIOPSY WILL BE BACK IN 7 DAYS  FOLLOW UP October 15 at  8:45.      _______________________________ Ansel Bong, MD 05/04/2014 11:01 AM

## 2014-05-04 NOTE — H&P (View-Only) (Signed)
   Subjective:    Patient ID: Mitchell Oliver, male    DOB: 11-09-1945, 68 y.o.   MRN: [id number] [name], MD  HPI Not feeling heartburn until food feeling like it's getting hung. Sx for 3 mos. No weight loss. Sx better with Rx: PROTONIX meds 1.5 mos ago. PT DENIES FEVER, CHILLS, HEMATOCHEZIA, nausea, vomiting, melena, diarrhea, CHEST PAIN, SHORTNESS OF BREATH,  CHANGE IN BOWEL IN HABITS, constipation, abdominal pain, OR problems with sedation.    Past Medical History  Diagnosis Date  . Hypertension   . Stroke   . Allergy   . IFG (impaired fasting glucose)   . ED (erectile dysfunction)   . CVA (cerebral vascular accident)     Past Surgical History  Procedure Laterality Date  . Colonoscopy     No Known Allergies  Current Outpatient Prescriptions  Medication Sig Dispense Refill  . amLODipine (NORVASC) 10 MG tablet Take 1 tablet (10 mg total) by mouth daily.    . Ascorbic Acid (VITAMIN C PO) Take by mouth.    Marland Kitchen atorvastatin (LIPITOR) 40 MG tablet Take 1 tablet (40 mg total) by mouth daily.    . benazepril (LOTENSIN) 20 MG tablet Take 1 tablet (20 mg total) by mouth daily.    . Cholecalciferol (VITAMIN D PO) Take by mouth.    . fish oil-omega-3 fatty acids 1000 MG capsule Take 2 g by mouth daily.    . metoprolol succinate (TOPROL-XL) 50 MG 24 hr tablet Take 1 tablet (50 mg total) by mouth daily. Take with or immediately following a meal.    . pantoprazole (PROTONIX) 40 MG tablet Take 1 tablet (40 mg total) by mouth daily before breakfast.    . Saw Palmetto, Serenoa repens, (SAW PALMETTO PO) Take by mouth.    . vardenafil (LEVITRA) 20 MG tablet Take 1 tablet (20 mg total) by mouth daily as needed for erectile dysfunction.     Family History  Problem Relation Age of Onset  . Hypertension Mother   . Hypertension Father   . Colon cancer Neg Hx   . Colon polyps Neg Hx     History  Substance Use Topics  . Smoking status: Former Research scientist (life sciences)  . Smokeless tobacco: Not on file  .  Alcohol Use: Not on file       Review of Systems PER HPI OTHERWISE ALL SYSTEMS ARE NEGATIVE.     Objective:   Physical Exam  Vitals reviewed. Constitutional: He is oriented to person, place, and time. He appears well-developed and well-nourished. No distress.  HENT:  Head: Normocephalic and atraumatic.  Mouth/Throat: Oropharynx is clear and moist. No oropharyngeal exudate.  Eyes: Pupils are equal, round, and reactive to light. No scleral icterus.  Neck: Normal range of motion. Neck supple.  Cardiovascular: Normal rate, regular rhythm and normal heart sounds.   Pulmonary/Chest: Effort normal and breath sounds normal. No respiratory distress.  Abdominal: Soft. Bowel sounds are normal. He exhibits no distension. There is no tenderness.  Musculoskeletal: He exhibits edema (TRACE BIL LE).  Lymphadenopathy:    He has no cervical adenopathy.  Neurological: He is alert and oriented to person, place, and time.  Psychiatric: He has a normal mood and affect.          Assessment & Plan:

## 2014-05-07 ENCOUNTER — Encounter: Payer: Self-pay | Admitting: Nurse Practitioner

## 2014-05-07 ENCOUNTER — Ambulatory Visit (INDEPENDENT_AMBULATORY_CARE_PROVIDER_SITE_OTHER): Payer: BC Managed Care – PPO | Admitting: Nurse Practitioner

## 2014-05-07 VITALS — BP 124/72 | Temp 98.5°F | Ht 71.5 in | Wt 265.2 lb

## 2014-05-07 DIAGNOSIS — H65191 Other acute nonsuppurative otitis media, right ear: Secondary | ICD-10-CM

## 2014-05-07 DIAGNOSIS — J310 Chronic rhinitis: Secondary | ICD-10-CM

## 2014-05-07 DIAGNOSIS — J329 Chronic sinusitis, unspecified: Secondary | ICD-10-CM

## 2014-05-07 DIAGNOSIS — H65199 Other acute nonsuppurative otitis media, unspecified ear: Secondary | ICD-10-CM

## 2014-05-07 MED ORDER — CEFUROXIME AXETIL 500 MG PO TABS: 500.0000 mg | ORAL_TABLET | Freq: Two times a day (BID) | ORAL | Status: DC

## 2014-05-07 MED ORDER — METHYLPREDNISOLONE ACETATE 40 MG/ML IJ SUSP
40.0000 mg | Freq: Once | INTRAMUSCULAR | Status: AC
  Administered 2014-05-07: 40 mg via INTRAMUSCULAR

## 2014-05-09 ENCOUNTER — Encounter: Payer: Self-pay | Admitting: Nurse Practitioner

## 2014-05-09 NOTE — Progress Notes (Signed)
Subjective:  Presents with complaints of sinus symptoms that began about a week ago. Low-grade fever. No cough. Head congestion. No headache. No wheezing. Right ear pain x3 days, muffled hearing. Sore throat has improved.  Objective:   BP 124/72  Temp(Src) 98.5 F (36.9 C) (Oral)  Ht 5' 11.5" (1.816 m)  Wt 265 lb 4 oz (120.317 kg)  BMI 36.48 kg/m2 NAD. Alert, oriented. Left TM cloudy effusion. Right TM dull and extremely erythematous. Pharynx injected with PND noted. Neck supple with mild soft anterior adenopathy. Lungs clear. Heart regular rate rhythm.  Assessment:Rhinosinusitis - Plan: methylPREDNISolone acetate (DEPO-MEDROL) injection 40 mg  Acute nonsuppurative otitis media of right ear  Plan: Meds ordered this encounter  Medications  . cefUROXime (CEFTIN) 500 MG tablet    Sig: Take 1 tablet (500 mg total) by mouth 2 (two) times daily with a meal.    Dispense:  20 tablet    Refill:  0    Order Specific Question:  Supervising Provider    Answer:  Mikey Kirschner [[id number]]  . methylPREDNISolone acetate (DEPO-MEDROL) injection 40 mg    Sig:    restart Flonase. Add OTC antihistamine. Continue saline nasal spray. Call back by the end of the week if no improvement, sooner if worse.

## 2014-05-17 ENCOUNTER — Telehealth: Payer: Self-pay | Admitting: Gastroenterology

## 2014-05-17 NOTE — Telephone Encounter (Signed)
Please call pt. HIS stomach Bx shows gastritis.    CONTINUE PROTONIX. TAKE 30 MINUTES PRIOR TO BREAKFAST FOREVER. FOLLOW A LOW FAT DIET.   FOLLOW UP IN October 15 at 8:45.

## 2014-05-21 NOTE — Telephone Encounter (Signed)
Called and informed pt.  

## 2014-05-23 NOTE — Telephone Encounter (Signed)
OV has been made

## 2014-05-25 ENCOUNTER — Ambulatory Visit (INDEPENDENT_AMBULATORY_CARE_PROVIDER_SITE_OTHER): Payer: BC Managed Care – PPO | Admitting: Nurse Practitioner

## 2014-05-25 ENCOUNTER — Encounter: Payer: Self-pay | Admitting: Nurse Practitioner

## 2014-05-25 VITALS — BP 128/84 | Temp 98.2°F | Ht 74.0 in | Wt 266.0 lb

## 2014-05-25 DIAGNOSIS — H6121 Impacted cerumen, right ear: Secondary | ICD-10-CM

## 2014-05-25 DIAGNOSIS — J0100 Acute maxillary sinusitis, unspecified: Secondary | ICD-10-CM

## 2014-05-25 DIAGNOSIS — H6120 Impacted cerumen, unspecified ear: Secondary | ICD-10-CM

## 2014-05-25 MED ORDER — PREDNISONE 20 MG PO TABS: ORAL_TABLET | ORAL | Status: DC

## 2014-05-25 MED ORDER — CEFUROXIME AXETIL 500 MG PO TABS: 500.0000 mg | ORAL_TABLET | Freq: Two times a day (BID) | ORAL | Status: DC

## 2014-06-01 ENCOUNTER — Encounter: Payer: Self-pay | Admitting: Nurse Practitioner

## 2014-06-01 NOTE — Progress Notes (Signed)
Subjective:  Presents complaints of sinus symptoms flared up again this past week. Was better from his previous infection on 7/13 then went out of town to 2 different cities which flared up his symptoms. Now having a maxillary area sinus headache radiating into the teeth. Sore throat. No ear pain. No fever. Minimal cough. Green nasal drainage. No wheezing.  Objective:   BP 128/84  Temp(Src) 98.2 F (36.8 C) (Oral)  Ht 6\' 2"  (1.88 m)  Wt 266 lb (120.657 kg)  BMI 34.14 kg/m2 NAD. Alert, oriented. Left TM clear effusion, no erythema. Right TM initially obscured with cerumen which was removed by the nurse without difficulty by irrigation, clear fluid noted. Pharynx injected with PND noted. Neck supple with mild soft anterior adenopathy. Lungs clear. Heart regular rate rhythm.  Assessment: Acute maxillary sinusitis, recurrence not specified  Cerumen impaction, right - Plan: Ear Lavage  Plan: Meds ordered this encounter  Medications  . cefUROXime (CEFTIN) 500 MG tablet    Sig: Take 1 tablet (500 mg total) by mouth 2 (two) times daily with a meal.    Dispense:  20 tablet    Refill:  0    Order Specific Question:  Supervising Provider    Answer:  Mikey Kirschner [[id number]]  . predniSONE (DELTASONE) 20 MG tablet    Sig: 3 po qd x 3 d then 2 po qd x 3 d then 1 po qd x 3 d    Dispense:  18 tablet    Refill:  0    Order Specific Question:  Supervising Provider    Answer:  Mikey Kirschner [[id number]]   OTC meds as directed. Callback in 7-10 days if no improvement, sooner if worse.

## 2014-06-06 ENCOUNTER — Other Ambulatory Visit: Payer: Self-pay | Admitting: Family Medicine

## 2014-06-27 ENCOUNTER — Encounter: Payer: BC Managed Care – PPO | Admitting: Family Medicine

## 2014-06-28 ENCOUNTER — Ambulatory Visit (INDEPENDENT_AMBULATORY_CARE_PROVIDER_SITE_OTHER): Payer: BC Managed Care – PPO | Admitting: Family Medicine

## 2014-06-28 ENCOUNTER — Encounter: Payer: Self-pay | Admitting: Family Medicine

## 2014-06-28 VITALS — BP 124/74 | Ht 71.25 in | Wt 268.0 lb

## 2014-06-28 DIAGNOSIS — Z79899 Other long term (current) drug therapy: Secondary | ICD-10-CM

## 2014-06-28 DIAGNOSIS — Z0000 Encounter for general adult medical examination without abnormal findings: Secondary | ICD-10-CM

## 2014-06-28 DIAGNOSIS — Z125 Encounter for screening for malignant neoplasm of prostate: Secondary | ICD-10-CM

## 2014-06-28 LAB — POCT URINALYSIS DIPSTICK
PROTEIN UA: 30
pH, UA: 6

## 2014-06-28 NOTE — Progress Notes (Signed)
   Subjective:    Patient ID: Mitchell Oliver, male    DOB: 1946-08-31, 68 y.o.   MRN: [id number]  HPI The patient comes in today for a wellness visit.    A review of their health history was completed.  A review of medications was also completed.  Any needed refills; yes refills are needed  Eating habits: good  Falls/  MVA accidents in past few months: none  Regular exercise: good  Specialist pt sees on regular basis: chiropractor  Preventative health issues were discussed.   Additional concerns: Patient states he has been having some back pain he would like to discuss.   Uses loca ice during flare of chronic left lumbar pain and discomfort Homeopathic remedy called two old goats helps  bms good  utd on colon, pmeum vacc Review of Systems  Constitutional: Negative for fever, activity change and appetite change.  HENT: Negative for congestion and rhinorrhea.   Eyes: Negative for discharge.  Respiratory: Negative for cough and wheezing.   Cardiovascular: Negative for chest pain.  Gastrointestinal: Negative for vomiting, abdominal pain and blood in stool.  Genitourinary: Negative for frequency and difficulty urinating.  Musculoskeletal: Negative for neck pain.  Skin: Negative for rash.  Allergic/Immunologic: Negative for environmental allergies and food allergies.  Neurological: Negative for weakness and headaches.  Psychiatric/Behavioral: Negative for agitation.  All other systems reviewed and are negative.      Objective:   Physical Exam  Vitals reviewed. Constitutional: He appears well-developed and well-nourished.  HENT:  Head: Normocephalic and atraumatic.  Right Ear: External ear normal.  Left Ear: External ear normal.  Nose: Nose normal.  Mouth/Throat: Oropharynx is clear and moist.  Eyes: EOM are normal. Pupils are equal, round, and reactive to light.  Neck: Normal range of motion. Neck supple. No thyromegaly present.  Cardiovascular: Normal rate,  regular rhythm and normal heart sounds.   No murmur heard. Pulmonary/Chest: Effort normal and breath sounds normal. No respiratory distress. He has no wheezes.  Abdominal: Soft. Bowel sounds are normal. He exhibits no distension and no mass. There is no tenderness.  Genitourinary: Penis normal.  Prostate within normal limits  Musculoskeletal: Normal range of motion. He exhibits no edema.  Lymphadenopathy:    He has no cervical adenopathy.  Neurological: He is alert. He exhibits normal muscle tone.  Skin: Skin is warm and dry. No erythema.  Psychiatric: He has a normal mood and affect. His behavior is normal. Judgment normal.    Urinalysis within normal limits      Assessment & Plan:  Impression 1 wellness exam #2 chronic back pain. Left lumbar primarily myofascial and not neuropathic but discussed #3 status post stroke stable. #4 increased urination plan appropriate blood work. Diet exercise discussed. Hemoccult cards. WSL

## 2014-07-03 LAB — HEPATIC FUNCTION PANEL
ALBUMIN: 4.1 g/dL (ref 3.5–5.2)
ALK PHOS: 45 U/L (ref 39–117)
ALT: 31 U/L (ref 0–53)
AST: 27 U/L (ref 0–37)
BILIRUBIN INDIRECT: 1 mg/dL (ref 0.2–1.2)
Bilirubin, Direct: 0.3 mg/dL (ref 0.0–0.3)
TOTAL PROTEIN: 6.8 g/dL (ref 6.0–8.3)
Total Bilirubin: 1.3 mg/dL — ABNORMAL HIGH (ref 0.2–1.2)

## 2014-07-03 LAB — BASIC METABOLIC PANEL
BUN: 10 mg/dL (ref 6–23)
CO2: 24 mEq/L (ref 19–32)
CREATININE: 1.11 mg/dL (ref 0.50–1.35)
Calcium: 9.6 mg/dL (ref 8.4–10.5)
Chloride: 99 mEq/L (ref 96–112)
GLUCOSE: 115 mg/dL — AB (ref 70–99)
Potassium: 5.3 mEq/L (ref 3.5–5.3)
Sodium: 133 mEq/L — ABNORMAL LOW (ref 135–145)

## 2014-07-03 LAB — LIPID PANEL
Cholesterol: 118 mg/dL (ref 0–200)
HDL: 41 mg/dL (ref >39)
LDL Cholesterol: 56 mg/dL (ref 0–99)
TRIGLYCERIDES: 106 mg/dL (ref <150)
Total CHOL/HDL Ratio: 2.9 Ratio
VLDL: 21 mg/dL (ref 0–40)

## 2014-07-04 ENCOUNTER — Other Ambulatory Visit: Payer: Self-pay | Admitting: Family Medicine

## 2014-07-04 LAB — PSA: PSA: 0.71 ng/mL (ref <=4.00)

## 2014-07-06 LAB — POC HEMOCCULT BLD/STL (HOME/3-CARD/SCREEN)
FECAL OCCULT BLD: NEGATIVE
FECAL OCCULT BLD: NEGATIVE
Fecal Occult Blood, POC: NEGATIVE

## 2014-07-20 ENCOUNTER — Telehealth: Payer: Self-pay | Admitting: Family Medicine

## 2014-07-20 NOTE — Telephone Encounter (Signed)
Patient states that at his last office visit he discussed with Dr. Richardson Landry changing him from Weir to Cialis. He stated that he had to have some bloodwork done first. The results are in the system. Patient wants results and wants to know if it is ok to now be prescribed the Cialis.

## 2014-07-20 NOTE — Telephone Encounter (Signed)
Patient has a question on a medication that Dr. Richardson Landry wanted to change for him. He would like the nurse to call him back. I asked him what was the medication he was questioning and he says "actually all of them".

## 2014-07-23 MED ORDER — TADALAFIL 20 MG PO TABS
ORAL_TABLET | ORAL | Status: DC
Start: 2014-07-23 — End: 2015-01-01

## 2014-07-23 NOTE — Telephone Encounter (Signed)
Notified patient lipid excellent kidney and liver function perfect, psa fine, glucose up slightly at 115, may try cialis 20 mg ten on two hrs prior to sex 11 refills. Medication sent to pharmacy. Patient verbalized understanding.

## 2014-07-23 NOTE — Telephone Encounter (Signed)
Lip excellent kid and liv func perfect, psa fine, glu up slightly at 115, may try cialis 20 mg ten on two hrs prior to sex 26 ref

## 2014-08-01 ENCOUNTER — Other Ambulatory Visit: Payer: Self-pay | Admitting: Family Medicine

## 2014-08-08 ENCOUNTER — Encounter: Payer: Self-pay | Admitting: Gastroenterology

## 2014-08-08 ENCOUNTER — Ambulatory Visit (INDEPENDENT_AMBULATORY_CARE_PROVIDER_SITE_OTHER): Payer: BC Managed Care – PPO | Admitting: Gastroenterology

## 2014-08-08 VITALS — BP 127/67 | HR 61 | Temp 97.0°F | Ht 74.0 in | Wt 273.4 lb

## 2014-08-08 DIAGNOSIS — R1310 Dysphagia, unspecified: Secondary | ICD-10-CM

## 2014-08-08 NOTE — Assessment & Plan Note (Signed)
SX RESOLVED.  PROTONIX DAILY LOW FAT DIET LOSE WEIGHT AVOID TRIGGERS FOR ACID EXPOSURE OPV 1 YR.  CALL WITH QUESTIONS OR CONCERNS. IF DYSPHAGIA RETURNS, WILL TRIAGE FOR REPEAT EGD/DIL.

## 2014-08-08 NOTE — Patient Instructions (Signed)
THE STRICTURE IN YOUR ESOPHAGUS WAS DUE TO ACID EXPOSURE IN YOUR ESOPHAGUS.   TO REDUCE ACID EXPOSURE:     1.CONTINUE PROTONIX. TAKE 30 MINUTES PRIOR TO BREAKFAST.      2. FOLLOW A LOW FAT DIET.       3. AVOID THINGS THAT TRIGGER ACID EXPOSURE IN THE ESOPHAGUS. SEE INFO BELOW.  FOLLOW UP IN ONE YEAR.  PLEASE CALL WITH QUESTIONS OR CONCERNS.     Lifestyle and home remedies TO REDUCE ACID EXPOSURE IN YOUR ESOPHAGUS  You may eliminate or reduce the frequency of heartburn by making the following lifestyle changes:    Control your weight. Being overweight is a major risk factor for heartburn and GERD. Excess pounds put pressure on your abdomen, pushing up your stomach and causing acid to back up into your esophagus.     Eat smaller meals. 4 TO 6 MEALS A DAY. This reduces pressure on the lower esophageal sphincter, helping to prevent the valve from opening and acid from washing back into your esophagus.     Loosen your belt. Clothes that fit tightly around your waist put pressure on your abdomen and the lower esophageal sphincter.     Eliminate heartburn triggers. Everyone has specific triggers. Common triggers such as fatty or fried foods, spicy food, tomato sauce, carbonated beverages, alcohol, chocolate, mint, garlic, onion, caffeine and nicotine may make heartburn worse.     Avoid stooping or bending. Tying your shoes is OK. Bending over for longer periods to weed your garden isn't, especially soon after eating.     Don't lie down after a meal. Wait at least three to four hours after eating before going to bed, and don't lie down right after eating.     PUT THE HEAD OF YOUR BED ON 6 INCH BLOCKS.   Alternative medicine   Several home remedies exist for treating GERD, but they provide only temporary relief. They include drinking baking soda (sodium bicarbonate) added to water or drinking other fluids such as baking soda mixed with cream of tartar and water.    Although these  liquids create temporary relief by neutralizing, washing away or buffering acids, eventually they aggravate the situation by adding gas and fluid to your stomach, increasing pressure and causing more acid reflux. Further, adding more sodium to your diet may increase your blood pressure and add stress to your heart, and excessive bicarbonate ingestion can alter the acid-base balance in your body.

## 2014-08-08 NOTE — Progress Notes (Signed)
   Subjective:    Patient ID: Mitchell Oliver, male    DOB: 1946/05/06, 68 y.o.   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 NO HEARTBURN. SWALLOWING PROBLEMS RESOLVED.   Past Medical History  Diagnosis Date  . Hypertension   . Stroke   . Allergy   . IFG (impaired fasting glucose)   . ED (erectile dysfunction)   . CVA (cerebral vascular accident)    Past Surgical History  Procedure Laterality Date  . Colonoscopy  2009  . Appendectomy  2009 [facility]  . Esophagogastroduodenoscopy N/A 05/04/2014    Procedure: ESOPHAGOGASTRODUODENOSCOPY (EGD);  Surgeon: Danie Binder, MD;  [facility];  Service: Endoscopy;  Laterality: N/A;  9:15  . Savory dilation N/A 05/04/2014    Procedure: SAVORY DILATION;  Surgeon: Danie Binder, MD;  [facility];  Service: Endoscopy;  Laterality: N/A;  . Esophageal biopsy  05/04/2014    Procedure: BIOPSY;  Surgeon: Danie Binder, MD;  [facility];  Service: Endoscopy;;   No Known Allergies  Current Outpatient Prescriptions  Medication Sig Dispense Refill  . amLODipine (NORVASC) 10 MG tablet Take 1 tablet (10 mg total) by mouth daily.    . Ascorbic Acid (VITAMIN C PO) Take by mouth.    Marland Kitchen aspirin EC 81 MG tablet Take 81 mg by mouth daily.    Marland Kitchen atorvastatin (LIPITOR) 40 MG tablet take 1 tablet by mouth once daily    . benazepril (LOTENSIN) 20 MG tablet take 1 tablet once daily    . Cholecalciferol (VITAMIN D PO) Take by mouth.    . fish oil-omega-3 fatty acids 1000 MG capsule Take 2 g by mouth daily.    . metoprolol succinate (TOPROL-XL) 50 MG 24 hr tablet Take 1 tablet (50 mg total) by mouth daily.    . NON FORMULARY Allergy meds otc as needed    . pantoprazole (PROTONIX) 40 MG tablet Take 1 tablet (40 mg total) by mouth daily before breakfast.    . Saw Palmetto, Serenoa repens, (SAW PALMETTO PO) Take by mouth.    . tadalafil (CIALIS) 20 MG tablet Take 1 tablet 2 hours prior to sex    .  vardenafil (LEVITRA) 20 MG tablet Take 1 tablet (20 mg total) by mouth daily as needed for erectile dysfunction.        Review of Systems     Objective:   Physical Exam  Vitals reviewed. Constitutional: He is oriented to person, place, and time. He appears well-developed and well-nourished. No distress.  HENT:  Head: Normocephalic and atraumatic.  Mouth/Throat: Oropharynx is clear and moist. No oropharyngeal exudate.  Eyes: Pupils are equal, round, and reactive to light. No scleral icterus.  Neck: Normal range of motion. Neck supple.  Cardiovascular: Normal rate, regular rhythm and normal heart sounds.   Pulmonary/Chest: Effort normal and breath sounds normal. No respiratory distress.  Abdominal: Soft. Bowel sounds are normal. He exhibits no distension. There is no tenderness.  Musculoskeletal: He exhibits no edema.  Lymphadenopathy:    He has no cervical adenopathy.  Neurological: He is alert and oriented to person, place, and time.  Psychiatric: He has a normal mood and affect.          Assessment & Plan:

## 2014-08-08 NOTE — Progress Notes (Signed)
APPT NIC'D °

## 2014-08-08 NOTE — Progress Notes (Signed)
cc'ed to pcp °

## 2014-08-09 ENCOUNTER — Ambulatory Visit: Payer: BC Managed Care – PPO | Admitting: Gastroenterology

## 2014-12-21 ENCOUNTER — Other Ambulatory Visit: Payer: Self-pay | Admitting: Family Medicine

## 2014-12-27 ENCOUNTER — Ambulatory Visit: Payer: BC Managed Care – PPO | Admitting: Family Medicine

## 2015-01-01 ENCOUNTER — Encounter: Payer: Self-pay | Admitting: Family Medicine

## 2015-01-01 ENCOUNTER — Ambulatory Visit (INDEPENDENT_AMBULATORY_CARE_PROVIDER_SITE_OTHER): Payer: 59 | Admitting: Family Medicine

## 2015-01-01 ENCOUNTER — Ambulatory Visit (HOSPITAL_COMMUNITY)
Admission: RE | Admit: 2015-01-01 | Discharge: 2015-01-01 | Disposition: A | Discharge status: Home / Self Care | Payer: 59 | Source: Ambulatory Visit | Attending: Family Medicine | Admitting: Family Medicine

## 2015-01-01 ENCOUNTER — Other Ambulatory Visit: Payer: Self-pay | Admitting: Family Medicine

## 2015-01-01 VITALS — BP 132/74 | Ht 74.0 in | Wt 272.0 lb

## 2015-01-01 DIAGNOSIS — M7989 Other specified soft tissue disorders: Secondary | ICD-10-CM

## 2015-01-01 DIAGNOSIS — E785 Hyperlipidemia, unspecified: Secondary | ICD-10-CM

## 2015-01-01 DIAGNOSIS — Z79899 Other long term (current) drug therapy: Secondary | ICD-10-CM

## 2015-01-01 DIAGNOSIS — Z139 Encounter for screening, unspecified: Secondary | ICD-10-CM

## 2015-01-01 LAB — D-DIMER, QUANTITATIVE: D-DIMER: 0.26 mg/L FEU (ref 0.00–0.49)

## 2015-01-01 MED ORDER — TADALAFIL 20 MG PO TABS: ORAL_TABLET | ORAL | Status: DC

## 2015-01-01 MED ORDER — ATORVASTATIN CALCIUM 40 MG PO TABS: 40.0000 mg | ORAL_TABLET | Freq: Every day | ORAL | Status: DC

## 2015-01-01 MED ORDER — PANTOPRAZOLE SODIUM 40 MG PO TBEC: 40.0000 mg | DELAYED_RELEASE_TABLET | Freq: Every day | ORAL | Status: DC

## 2015-01-01 MED ORDER — METOPROLOL SUCCINATE ER 50 MG PO TB24
50.0000 mg | ORAL_TABLET | Freq: Every day | ORAL | Status: DC
Start: 2015-01-01 — End: 2015-01-23

## 2015-01-01 MED ORDER — BENAZEPRIL HCL 20 MG PO TABS: 20.0000 mg | ORAL_TABLET | Freq: Every day | ORAL | Status: DC

## 2015-01-01 MED ORDER — AMLODIPINE BESYLATE 10 MG PO TABS: 10.0000 mg | ORAL_TABLET | Freq: Every day | ORAL | Status: DC

## 2015-01-01 NOTE — Progress Notes (Signed)
   Subjective:    Patient ID: Mitchell Oliver, male    DOB: 01-12-1946, 69 y.o.   MRN: [id number]  Hypertension This is a chronic problem. The current episode started more than 1 year ago. There are no compliance problems.   Exercises about 3 times a week. Tries to eat healthy.   Bp generally good. Numbers excellent. Compliant with meds.  No longer difficulty swallowing  No sig swelling on left  Concerns about right leg swelling. Started 2 -3 weeks ago. Recalls no injury. No obvious calf pain. Swelling worsens throughout the day.  His chronic cut down sugar intake.  No further symptoms of stroke  Review of Systems No headache no chest pain no back pain no abdominal pain no change in bowel habits    Objective:   Physical Exam Alert vital stable neurological exam intact HET normal lungs clear heart regular in rhythm. Right leg 2+ edema left leg 1+ edema       Assessment & Plan:  Impression #1 venous stasis #2 right leg flare with need to rule out DVT discussed #3 hypertension good control #4 hyperlipidemia status uncertain #5 glucose intolerance status uncertain plan appropriate blood work. Maintain same medications. Further recommendations based on results. WSL

## 2015-01-02 LAB — HEPATIC FUNCTION PANEL
ALT: 34 IU/L (ref 0–44)
AST: 29 IU/L (ref 0–40)
Albumin: 4.4 g/dL (ref 3.5–4.8)
Alkaline Phosphatase: 49 IU/L (ref 39–117)
BILIRUBIN TOTAL: 1.1 mg/dL (ref 0.0–1.2)
BILIRUBIN, DIRECT: 0.31 mg/dL (ref 0.00–0.40)
Total Protein: 7.2 g/dL (ref 6.0–8.5)

## 2015-01-02 LAB — LIPID PANEL
Chol/HDL Ratio: 2.4 ratio units (ref 0.0–5.0)
Cholesterol, Total: 122 mg/dL (ref 100–199)
HDL: 50 mg/dL (ref >39)
LDL Calculated: 55 mg/dL (ref 0–99)
TRIGLYCERIDES: 83 mg/dL (ref 0–149)
VLDL Cholesterol Cal: 17 mg/dL (ref 5–40)

## 2015-01-02 LAB — BRAIN NATRIURETIC PEPTIDE: BNP: 79.7 pg/mL (ref 0.0–100.0)

## 2015-01-02 LAB — GLUCOSE, RANDOM: GLUCOSE: 114 mg/dL — AB (ref 65–99)

## 2015-01-13 ENCOUNTER — Encounter: Payer: Self-pay | Admitting: Family Medicine

## 2015-01-23 ENCOUNTER — Telehealth: Payer: Self-pay | Admitting: *Deleted

## 2015-01-23 MED ORDER — BENAZEPRIL HCL 20 MG PO TABS: 20.0000 mg | ORAL_TABLET | Freq: Every day | ORAL | Status: DC

## 2015-01-23 MED ORDER — METOPROLOL SUCCINATE ER 50 MG PO TB24: 50.0000 mg | ORAL_TABLET | Freq: Every day | ORAL | Status: DC

## 2015-01-23 MED ORDER — PANTOPRAZOLE SODIUM 40 MG PO TBEC: 40.0000 mg | DELAYED_RELEASE_TABLET | Freq: Every day | ORAL | Status: DC

## 2015-01-23 MED ORDER — TADALAFIL 20 MG PO TABS: ORAL_TABLET | ORAL | Status: DC

## 2015-01-23 MED ORDER — ATORVASTATIN CALCIUM 40 MG PO TABS: 40.0000 mg | ORAL_TABLET | Freq: Every day | ORAL | Status: DC

## 2015-01-23 MED ORDER — AMLODIPINE BESYLATE 10 MG PO TABS: 10.0000 mg | ORAL_TABLET | Freq: Every day | ORAL | Status: DC

## 2015-01-23 NOTE — Telephone Encounter (Signed)
Meds sent to pharmacy. Pt notified.

## 2015-05-01 ENCOUNTER — Encounter: Payer: Self-pay | Admitting: Family Medicine

## 2015-05-01 ENCOUNTER — Ambulatory Visit (INDEPENDENT_AMBULATORY_CARE_PROVIDER_SITE_OTHER): Payer: 59 | Admitting: Family Medicine

## 2015-05-01 VITALS — BP 140/76 | Temp 97.9°F | Ht 74.0 in | Wt 270.0 lb

## 2015-05-01 DIAGNOSIS — J209 Acute bronchitis, unspecified: Secondary | ICD-10-CM

## 2015-05-01 MED ORDER — CEFDINIR 300 MG PO CAPS: 300.0000 mg | ORAL_CAPSULE | Freq: Two times a day (BID) | ORAL | Status: DC

## 2015-05-01 NOTE — Progress Notes (Signed)
   Subjective:    Patient ID: Mitchell Oliver, male    DOB: 1946-05-24, 69 y.o.   MRN: [id number]  URI  This is a new problem. The current episode started in the past 7 days. The problem has been gradually worsening. Maximum temperature: unmeasured. Associated symptoms include chest pain, congestion, coughing, a sore throat and wheezing. Associated symptoms comments: Fatigue, chills. He has tried decongestant for the symptoms. The treatment provided no relief.   Patient states that he has no other concerns at this time.   Sun night dev cough and cong  Started coughing , felt tight  mon morn urg[facility]are on omonday   Last night more cough with productive phlegm  Gunky stuff this morn  Low gr fever dim energy  ibu prn, Youngest  Son has CF and exp to him and others last thur  Review of Systems  HENT: Positive for congestion and sore throat.   Respiratory: Positive for cough and wheezing.   Cardiovascular: Positive for chest pain.       Objective:   Physical Exam Alert vitals stable no acute distress. H&T mom his congestion runny nose. Pharynx normal neck supple. Lungs bronchial cough no wheezes no crackles heart regular in rhythm. Impression       Assessment & Plan:  Impression post viral bronchitis plan Omnicef twice a day 10 days. Symptomatic care discussed warning signs discussed WSL

## 2015-05-03 ENCOUNTER — Ambulatory Visit (HOSPITAL_COMMUNITY)
Admission: RE | Admit: 2015-05-03 | Discharge: 2015-05-03 | Disposition: A | Discharge status: Home / Self Care | Payer: 59 | Source: Ambulatory Visit | Attending: Family Medicine | Admitting: Family Medicine

## 2015-05-03 ENCOUNTER — Encounter: Payer: Self-pay | Admitting: Family Medicine

## 2015-05-03 ENCOUNTER — Ambulatory Visit (INDEPENDENT_AMBULATORY_CARE_PROVIDER_SITE_OTHER): Payer: 59 | Admitting: Family Medicine

## 2015-05-03 VITALS — BP 140/82 | Temp 98.1°F | Ht 74.0 in | Wt 270.5 lb

## 2015-05-03 DIAGNOSIS — R0602 Shortness of breath: Secondary | ICD-10-CM | POA: Diagnosis not present

## 2015-05-03 DIAGNOSIS — R05 Cough: Secondary | ICD-10-CM | POA: Diagnosis not present

## 2015-05-03 DIAGNOSIS — R062 Wheezing: Secondary | ICD-10-CM | POA: Diagnosis not present

## 2015-05-03 DIAGNOSIS — R079 Chest pain, unspecified: Secondary | ICD-10-CM | POA: Diagnosis not present

## 2015-05-03 DIAGNOSIS — J208 Acute bronchitis due to other specified organisms: Secondary | ICD-10-CM | POA: Diagnosis not present

## 2015-05-03 MED ORDER — PREDNISONE 20 MG PO TABS
ORAL_TABLET | ORAL | Status: DC
Start: 2015-05-03 — End: 2015-07-03

## 2015-05-03 MED ORDER — ALBUTEROL SULFATE HFA 108 (90 BASE) MCG/ACT IN AERS: 2.0000 | INHALATION_SPRAY | Freq: Four times a day (QID) | RESPIRATORY_TRACT | Status: DC | PRN

## 2015-05-03 NOTE — Progress Notes (Signed)
   Subjective:    Patient ID: Mitchell Oliver, male    DOB: 1946-05-14, 69 y.o.   MRN: [id number]  Cough This is a new problem. The current episode started in the past 7 days. The problem has been gradually worsening. The problem occurs every few minutes. The cough is productive of sputum. Associated symptoms include chest pain, headaches, postnasal drip, rhinorrhea, a sore throat, shortness of breath and wheezing. Pertinent negatives include no ear pain or fever. Nothing aggravates the symptoms. Treatments tried: Omincef, Mucinex D. The treatment provided no relief.   Patient states no other concerns this visit. Patient relates earlier this week it was more chest congestion and head congestion now both are worse with intermittent wheezing and some shortness of breath with activity  Review of Systems  Constitutional: Negative for fever and activity change.  HENT: Positive for congestion, postnasal drip, rhinorrhea and sore throat. Negative for ear pain.   Eyes: Negative for discharge.  Respiratory: Positive for cough, shortness of breath and wheezing.   Cardiovascular: Positive for chest pain.  Neurological: Positive for headaches.       Objective:   Physical Exam  Constitutional: He appears well-developed.  HENT:  Head: Normocephalic.  Mouth/Throat: Oropharynx is clear and moist. No oropharyngeal exudate.  Neck: Normal range of motion.  Cardiovascular: Normal rate, regular rhythm and normal heart sounds.   No murmur heard. Pulmonary/Chest: Effort normal and breath sounds normal. He has no wheezes.  Lymphadenopathy:    He has no cervical adenopathy.  Neurological: He exhibits normal muscle tone.  Skin: Skin is warm and dry.  Nursing note and vitals reviewed.   O2 saturation 97%  Patient will keep regular follow-ups    Assessment & Plan:  Significant bronchitis with reactive airway concerning for the possibility of pneumonia x-ray ordered. Albuterol 2 puffs every 4 hours on a  frequent basis the next few days continued antibiotics at a short course of steroid-induced if not having significant improvement within the next 3-4 and then it will be very important to follow-up if high fevers severe difficulty breathing or worse needs to go to emergency department

## 2015-06-03 HISTORY — PX: POSTERIOR LAMINECTOMY / DECOMPRESSION LUMBAR SPINE: SUR740

## 2015-07-03 ENCOUNTER — Encounter: Payer: Self-pay | Admitting: Family Medicine

## 2015-07-03 ENCOUNTER — Ambulatory Visit (INDEPENDENT_AMBULATORY_CARE_PROVIDER_SITE_OTHER): Payer: 59 | Admitting: Family Medicine

## 2015-07-03 VITALS — BP 130/72 | Ht 72.0 in | Wt 261.4 lb

## 2015-07-03 DIAGNOSIS — Z0000 Encounter for general adult medical examination without abnormal findings: Secondary | ICD-10-CM

## 2015-07-03 DIAGNOSIS — E785 Hyperlipidemia, unspecified: Secondary | ICD-10-CM | POA: Diagnosis not present

## 2015-07-03 DIAGNOSIS — Z23 Encounter for immunization: Secondary | ICD-10-CM | POA: Diagnosis not present

## 2015-07-03 DIAGNOSIS — Z13220 Encounter for screening for lipoid disorders: Secondary | ICD-10-CM

## 2015-07-03 DIAGNOSIS — I10 Essential (primary) hypertension: Secondary | ICD-10-CM

## 2015-07-03 DIAGNOSIS — Z125 Encounter for screening for malignant neoplasm of prostate: Secondary | ICD-10-CM

## 2015-07-03 MED ORDER — BENAZEPRIL HCL 20 MG PO TABS: 20.0000 mg | ORAL_TABLET | Freq: Every day | ORAL | Status: DC

## 2015-07-03 MED ORDER — METOPROLOL SUCCINATE ER 50 MG PO TB24: 50.0000 mg | ORAL_TABLET | Freq: Every day | ORAL | Status: DC

## 2015-07-03 MED ORDER — ATORVASTATIN CALCIUM 40 MG PO TABS: 40.0000 mg | ORAL_TABLET | Freq: Every day | ORAL | Status: DC

## 2015-07-03 MED ORDER — AMLODIPINE BESYLATE 10 MG PO TABS: 10.0000 mg | ORAL_TABLET | Freq: Every day | ORAL | Status: DC

## 2015-07-03 MED ORDER — PANTOPRAZOLE SODIUM 40 MG PO TBEC: 40.0000 mg | DELAYED_RELEASE_TABLET | Freq: Every day | ORAL | Status: DC

## 2015-07-03 NOTE — Progress Notes (Signed)
   Subjective:    Patient ID: Mitchell Oliver, male    DOB: [birth year], 69 y.o.   MRN: [id number]  HPI The patient comes in today for a wellness visit.    A review of their health history was completed.  A review of medications was also completed.  Any needed refills: yes  Eating habits: good  Falls/  MVA accidents in past few months: none  Regular exercise: yes, walking   Specialist pt sees on regular basis: none  Preventative health issues were discussed.   Additional concerns: none  Colonoscopy- Last one was May 2010. No abnormal findings. Done at [facility]. Likely ten yrs for f u per pt  Has not traveled for the past two months  Compliant with meds  Diet overall is better, has improved  Has lost weight  Watching diet mpor e closely     Patient claims compliance with blood pressure medication. No obvious side effects. Has cut down her salt intake.  Claims compliance with lipid medicine. Status uncertain. Does not miss a dose. Has cut fats pretty much out of diet.      Review of Systems  Constitutional: Negative for fever, activity change and appetite change.  HENT: Negative for congestion and rhinorrhea.   Eyes: Negative for discharge.  Respiratory: Negative for cough and wheezing.   Cardiovascular: Negative for chest pain.  Gastrointestinal: Negative for vomiting, abdominal pain and blood in stool.  Genitourinary: Negative for frequency and difficulty urinating.  Musculoskeletal: Negative for neck pain.  Skin: Negative for rash.  Allergic/Immunologic: Negative for environmental allergies and food allergies.  Neurological: Negative for weakness and headaches.  Psychiatric/Behavioral: Negative for agitation.  All other systems reviewed and are negative.      Objective:   Physical Exam  Constitutional: He appears well-developed and well-nourished.  HENT:  Head: Normocephalic and atraumatic.  Right Ear: External ear normal.  Left Ear: External  ear normal.  Nose: Nose normal.  Mouth/Throat: Oropharynx is clear and moist.  Eyes: EOM are normal. Pupils are equal, round, and reactive to light.  Neck: Normal range of motion. Neck supple. No thyromegaly present.  Cardiovascular: Normal rate, regular rhythm and normal heart sounds.   No murmur heard. Pulmonary/Chest: Effort normal and breath sounds normal. No respiratory distress. He has no wheezes.  Abdominal: Soft. Bowel sounds are normal. He exhibits no distension and no mass. There is no tenderness.  Genitourinary: Penis normal.  Prostate within normal limits  Musculoskeletal: Normal range of motion. He exhibits no edema.  Lymphadenopathy:    He has no cervical adenopathy.  Neurological: He is alert. He exhibits normal muscle tone.  Skin: Skin is warm and dry. No erythema.  Psychiatric: He has a normal mood and affect. His behavior is normal. Judgment normal.  Vitals reviewed.         Assessment & Plan:  Impression 1 wellness exam #2 hypertension good control #3 hyperlipidemia status uncertain #4 history stroke plan Hemoccults cards. Appropriate blood work. Medications refilled. Diet exercise discussed. Will maintain same dose of blood pressure medicine and may need to change based on lipid results WSL flu vaccine

## 2015-07-04 LAB — BASIC METABOLIC PANEL
BUN / CREAT RATIO: 6 — AB (ref 10–22)
BUN: 7 mg/dL — AB (ref 8–27)
CHLORIDE: 93 mmol/L — AB (ref 97–108)
CO2: 23 mmol/L (ref 18–29)
CREATININE: 1.09 mg/dL (ref 0.76–1.27)
Calcium: 10.1 mg/dL (ref 8.6–10.2)
GFR calc non Af Amer: 68 mL/min/{1.73_m2} (ref >59)
GFR, EST AFRICAN AMERICAN: 79 mL/min/{1.73_m2} (ref >59)
GLUCOSE: 105 mg/dL — AB (ref 65–99)
Potassium: 5.1 mmol/L (ref 3.5–5.2)
SODIUM: 132 mmol/L — AB (ref 134–144)

## 2015-07-04 LAB — HEPATIC FUNCTION PANEL
ALK PHOS: 52 IU/L (ref 39–117)
ALT: 34 IU/L (ref 0–44)
AST: 26 IU/L (ref 0–40)
Albumin: 4.4 g/dL (ref 3.5–4.8)
BILIRUBIN TOTAL: 1 mg/dL (ref 0.0–1.2)
BILIRUBIN, DIRECT: 0.32 mg/dL (ref 0.00–0.40)
Total Protein: 7.2 g/dL (ref 6.0–8.5)

## 2015-07-04 LAB — LIPID PANEL
CHOL/HDL RATIO: 2.9 ratio (ref 0.0–5.0)
Cholesterol, Total: 132 mg/dL (ref 100–199)
HDL: 46 mg/dL (ref >39)
LDL Calculated: 63 mg/dL (ref 0–99)
TRIGLYCERIDES: 113 mg/dL (ref 0–149)
VLDL Cholesterol Cal: 23 mg/dL (ref 5–40)

## 2015-07-04 LAB — PSA: PROSTATE SPECIFIC AG, SERUM: 0.8 ng/mL (ref 0.0–4.0)

## 2015-07-08 ENCOUNTER — Encounter: Payer: Self-pay | Admitting: Gastroenterology

## 2015-07-11 ENCOUNTER — Encounter: Payer: Self-pay | Admitting: Family Medicine

## 2015-07-16 ENCOUNTER — Emergency Department (HOSPITAL_COMMUNITY): Payer: 59

## 2015-07-16 ENCOUNTER — Other Ambulatory Visit: Payer: Self-pay

## 2015-07-16 ENCOUNTER — Encounter (HOSPITAL_COMMUNITY): Payer: Self-pay

## 2015-07-16 ENCOUNTER — Inpatient Hospital Stay (HOSPITAL_COMMUNITY)
Admission: EM | Admit: 2015-07-16 | Discharge: 2015-07-19 | DRG: 247 | Disposition: A | Discharge status: Home Health | Payer: 59 | Attending: Cardiology | Admitting: Cardiology

## 2015-07-16 DIAGNOSIS — Z79899 Other long term (current) drug therapy: Secondary | ICD-10-CM

## 2015-07-16 DIAGNOSIS — I2584 Coronary atherosclerosis due to calcified coronary lesion: Secondary | ICD-10-CM | POA: Diagnosis present

## 2015-07-16 DIAGNOSIS — E785 Hyperlipidemia, unspecified: Secondary | ICD-10-CM | POA: Diagnosis present

## 2015-07-16 DIAGNOSIS — I214 Non-ST elevation (NSTEMI) myocardial infarction: Principal | ICD-10-CM | POA: Diagnosis present

## 2015-07-16 DIAGNOSIS — I10 Essential (primary) hypertension: Secondary | ICD-10-CM | POA: Diagnosis present

## 2015-07-16 DIAGNOSIS — Z87891 Personal history of nicotine dependence: Secondary | ICD-10-CM

## 2015-07-16 DIAGNOSIS — Z8249 Family history of ischemic heart disease and other diseases of the circulatory system: Secondary | ICD-10-CM

## 2015-07-16 DIAGNOSIS — R079 Chest pain, unspecified: Secondary | ICD-10-CM | POA: Insufficient documentation

## 2015-07-16 DIAGNOSIS — I25110 Atherosclerotic heart disease of native coronary artery with unstable angina pectoris: Secondary | ICD-10-CM | POA: Diagnosis present

## 2015-07-16 DIAGNOSIS — Z8673 Personal history of transient ischemic attack (TIA), and cerebral infarction without residual deficits: Secondary | ICD-10-CM

## 2015-07-16 DIAGNOSIS — Z7982 Long term (current) use of aspirin: Secondary | ICD-10-CM

## 2015-07-16 DIAGNOSIS — Z955 Presence of coronary angioplasty implant and graft: Secondary | ICD-10-CM

## 2015-07-16 DIAGNOSIS — M25512 Pain in left shoulder: Secondary | ICD-10-CM | POA: Diagnosis not present

## 2015-07-16 LAB — CBC WITH DIFFERENTIAL/PLATELET
BASOS ABS: 0.1 10*3/uL (ref 0.0–0.1)
BASOS PCT: 1 %
Eosinophils Absolute: 0.3 10*3/uL (ref 0.0–0.7)
Eosinophils Relative: 2 %
HEMATOCRIT: 40.9 % (ref 39.0–52.0)
HEMOGLOBIN: 14.5 g/dL (ref 13.0–17.0)
LYMPHS PCT: 22 %
Lymphs Abs: 2.7 10*3/uL (ref 0.7–4.0)
MCH: 30.9 pg (ref 26.0–34.0)
MCHC: 35.5 g/dL (ref 30.0–36.0)
MCV: 87.2 fL (ref 78.0–100.0)
MONOS PCT: 8 %
Monocytes Absolute: 1 10*3/uL (ref 0.1–1.0)
NEUTROS ABS: 8.3 10*3/uL — AB (ref 1.7–7.7)
NEUTROS PCT: 67 %
Platelets: 262 10*3/uL (ref 150–400)
RBC: 4.69 MIL/uL (ref 4.22–5.81)
RDW: 13.2 % (ref 11.5–15.5)
WBC: 12.4 10*3/uL — ABNORMAL HIGH (ref 4.0–10.5)

## 2015-07-16 LAB — POC HEMOCCULT BLD/STL (HOME/3-CARD/SCREEN)
Card #2 Fecal Occult Blod, POC: NEGATIVE
FECAL OCCULT BLD: NEGATIVE
FECAL OCCULT BLD: NEGATIVE

## 2015-07-16 MED ORDER — ASPIRIN 325 MG PO TABS
325.0000 mg | ORAL_TABLET | Freq: Once | ORAL | Status: AC
  Administered 2015-07-16: 325 mg via ORAL
  Filled 2015-07-16: qty 1

## 2015-07-16 NOTE — ED Notes (Signed)
Left shoulder pain and left upper back pain that started 30 minutes ago. Hurts less when I move it per pt. No known injury per pt.

## 2015-07-16 NOTE — ED Provider Notes (Signed)
CSN: [id number]     Arrival date & time 07/16/15  2146 History  This chart was scribed for Merryl Hacker, MD by Randa Evens, ED Scribe. This patient was seen in room APA02/[facility]02 and the patient's care was started at 11:02 PM.    Chief Complaint  Patient presents with  . Shoulder Pain   The history is provided by the patient. No language interpreter was used.   HPI Comments: Mitchell Oliver is a 69 y.o. male with PMHx of HTN and stroke who presents to the Emergency Department complaining of resolved left shoulder pain onset 30 minutes PTA at approx 9pm.  Shoulder pain that radiated into his neck. He described the pain is burning.  Denies pain at this time. Denies any associated chest pain. Pt doesn't report any alleviating factors. Patient denies injury to left shoulder. Denies any heavy lifting. Pt denies any medications PTA.   Denies CP, SOB or diaphoresis.   Denies history of heart disease.  history of hypertension, stroke, "borderline high cholesterol and blood sugar."   Past Medical History  Diagnosis Date  . Hypertension   . Stroke   . Allergy   . IFG (impaired fasting glucose)   . ED (erectile dysfunction)   . CVA (cerebral vascular accident)    Past Surgical History  Procedure Laterality Date  . Colonoscopy  2009  . Appendectomy  2009 [facility]  . Esophagogastroduodenoscopy N/A 05/04/2014    Procedure: ESOPHAGOGASTRODUODENOSCOPY (EGD);  Surgeon: Danie Binder, MD;  [facility];  Service: Endoscopy;  Laterality: N/A;  9:15  . Savory dilation N/A 05/04/2014    Procedure: SAVORY DILATION;  Surgeon: Danie Binder, MD;  [facility];  Service: Endoscopy;  Laterality: N/A;  . Esophageal biopsy  05/04/2014    Procedure: BIOPSY;  Surgeon: Danie Binder, MD;  [facility];  Service: Endoscopy;;  . Back surgery     Family History  Problem Relation Age of Onset  . Hypertension Mother   . Hypertension Father   . Colon cancer Neg Hx   . Colon  polyps Neg Hx    Social History  Substance Use Topics  . Smoking status: Former Research scientist (life sciences)  . Smokeless tobacco: None     Comment: Quit since 1987  . Alcohol Use: Yes    Review of Systems  Constitutional: Negative.  Negative for fever.  Respiratory: Negative.  Negative for chest tightness and shortness of breath.   Cardiovascular: Negative.  Negative for chest pain.  Gastrointestinal: Negative.  Negative for abdominal pain.  Genitourinary: Negative.  Negative for dysuria.  Musculoskeletal: Positive for neck pain. Negative for back pain.       Shoulder pain  Neurological: Negative for headaches.  All other systems reviewed and are negative.      Allergies  Review of patient's allergies indicates no known allergies.  Home Medications   Prior to Admission medications   Medication Sig Start Date End Date Taking? Authorizing Provider  albuterol (PROVENTIL HFA;VENTOLIN HFA) 108 (90 BASE) MCG/ACT inhaler Inhale 2 puffs into the lungs every 6 (six) hours as needed for wheezing. 05/03/15  Yes Kathyrn Drown, MD  amLODipine (NORVASC) 10 MG tablet Take 1 tablet (10 mg total) by mouth daily. 07/03/15  Yes Mikey Kirschner, MD  Ascorbic Acid (VITAMIN C PO) Take 1 tablet by mouth daily.    Yes Historical Provider, MD  aspirin EC 81 MG tablet Take 81 mg by mouth daily.   Yes Historical  Provider, MD  atorvastatin (LIPITOR) 40 MG tablet Take 1 tablet (40 mg total) by mouth daily. 07/03/15  Yes Mikey Kirschner, MD  benazepril (LOTENSIN) 20 MG tablet Take 1 tablet (20 mg total) by mouth daily. 07/03/15  Yes Mikey Kirschner, MD  Cholecalciferol (VITAMIN D PO) Take 1,000 Units by mouth daily.    Yes Historical Provider, MD  fish oil-omega-3 fatty acids 1000 MG capsule Take 2 g by mouth daily.   Yes Historical Provider, MD  fluticasone (FLONASE) 50 MCG/ACT nasal spray Place 1 spray into both nostrils daily as needed for allergies or rhinitis.    Yes Historical Provider, MD  metoprolol succinate (TOPROL-XL)  50 MG 24 hr tablet Take 1 tablet (50 mg total) by mouth daily. 07/03/15  Yes Mikey Kirschner, MD  pantoprazole (PROTONIX) 40 MG tablet Take 1 tablet (40 mg total) by mouth daily before breakfast. 07/03/15 07/02/16 Yes Mikey Kirschner, MD  tadalafil (CIALIS) 20 MG tablet Take 1 tablet 2 hours prior to sex Patient taking differently: Take 20 mg by mouth daily as needed for erectile dysfunction. Take 1 tablet 2 hours prior to sex 01/23/15  Yes Mikey Kirschner, MD   BP 179/92 mmHg  Pulse 78  Temp(Src) 98.4 F (36.9 C) (Oral)  Resp 16  Ht 6\' 1"  (1.854 m)  Wt 261 lb (118.389 kg)  BMI 34.44 kg/m2  SpO2 96%   Physical Exam  Constitutional: He is oriented to person, place, and time. He appears well-developed and well-nourished. No distress.  HENT:  Head: Normocephalic and atraumatic.  Neck: Normal range of motion. Neck supple.  Cardiovascular: Normal rate, regular rhythm and normal heart sounds.   No murmur heard. Pulmonary/Chest: Effort normal and breath sounds normal. No respiratory distress. He has no wheezes.  Abdominal: Soft. Bowel sounds are normal. There is no tenderness. There is no rebound.  Musculoskeletal: He exhibits no edema.  Normal range of motion of the left shoulder, no tenderness to palpation of the clavicle or humeral head  Neurological: He is alert and oriented to person, place, and time.  Skin: Skin is warm and dry.  Psychiatric: He has a normal mood and affect.  Nursing note and vitals reviewed.   ED Course  Procedures (including critical care time) CRITICAL CARE Performed by: Merryl Hacker   Total critical care time: 31 min  Critical care time was exclusive of separately billable procedures and treating other patients.  Critical care was necessary to treat or prevent imminent or life-threatening deterioration.  Critical care was time spent personally by me on the following activities: development of treatment plan with patient and/or surrogate as well as  nursing, discussions with consultants, evaluation of patient's response to treatment, examination of patient, obtaining history from patient or surrogate, ordering and performing treatments and interventions, ordering and review of laboratory studies, ordering and review of radiographic studies, pulse oximetry and re-evaluation of patient's condition.   DIAGNOSTIC STUDIES: Oxygen Saturation is 96% on RA, normal by my interpretation.    COORDINATION OF CARE: 11:14 PM-Discussed treatment plan with pt at bedside and pt agreed to plan.     Labs Review Labs Reviewed  CBC WITH DIFFERENTIAL/PLATELET - Abnormal; Notable for the following:    WBC 12.4 (*)    Neutro Abs 8.3 (*)    All other components within normal limits  BASIC METABOLIC PANEL - Abnormal; Notable for the following:    Sodium 134 (*)    Glucose, Bld 135 (*)  All other components within normal limits  TROPONIN I - Abnormal; Notable for the following:    Troponin I 0.04 (*)    All other components within normal limits    Imaging Review Dg Chest 2 View  07/17/2015   CLINICAL DATA:  LEFT shoulder pain radiating to LEFT scapula after eating dinner around 9 p.m. symptoms now resolved. Former smoker, hypertension, history of stroke.  EXAM: CHEST  2 VIEW  COMPARISON:  Chest radiograph May 03, 2015  FINDINGS: The cardiac silhouette is upper limits of normal in size, unchanged. Mediastinal silhouette is nonsuspicious. Mild chronic interstitial changes increased lung volumes compatible with COPD. No pleural effusion or focal consolidation. No pneumothorax. Soft tissue planes and included osseous structures are nonsuspicious. Mild degenerative change of the lower thoracic spine.  IMPRESSION: Borderline cardiomegaly and COPD; no superimposed acute cardiopulmonary process.   Electronically Signed   By: Elon Alas M.D.   On: 07/17/2015 00:38    EKG Interpretation  Date/Time:  Tuesday July 16 2015 22:01:25 EDT Ventricular Rate:   78 PR Interval:  170 QRS Duration: 88 QT Interval:  384 QTC Calculation: 437 R Axis:   46 Text Interpretation:  Sinus rhythm with Premature atrial complexes Otherwise normal ECG Confirmed by Christerpher Clos  MD, Amedio Bowlby ([id number]) on 07/16/2015 11:43:17 PM       MDM   Final diagnoses:  NSTEMI (non-ST elevated myocardial infarction)     Patient presents with neck and shoulder pain. Onset prior to arrival. Nonexertional. Denies any injury. Symptoms have since resolved. Nontoxic on exam. Considerations include atypical ACS as patient has risk factors  including hypertension, "borderline hyperlipidemia and blood sugar", former smoking.  Musculoskeletal pain would also be consideration; however, patient is not tender in denies injury.  Did describe the pain as burning but not associated with eating EKG is reassuring and shows no evidence of ischemia at this time. Chest x-ray shows borderline cardiomegaly and COPD changes. Lab work shows an initial troponin is 0.04 which is mildly elevated. Patient has a normal creatinine clearance. Patient was given a full dose aspirin. Discussed with cardiology on call, Dr. Marlowe Sax.  Patient will be transferred to [facility] for serial enzymes and formal cardiac evaluation. Patient was given 1 dose of Lovenox prior to transport.  I personally performed the services described in this documentation, which was scribed in my presence. The recorded information has been reviewed and is accurate.      Merryl Hacker, MD 07/17/15 [phone]

## 2015-07-16 NOTE — ED Notes (Signed)
Had back surgery in August at [facility] and had a recheck this morning. Released me to return to work October 8th per pt.

## 2015-07-16 NOTE — ED Notes (Signed)
Pt ambulated to restroom & returned to room w/ no complications. 

## 2015-07-16 NOTE — ED Notes (Signed)
   07/16/15 2216  Musculoskeletal  Musculoskeletal (WDL) X  LUE Other (Comment)  pt reports a burning in his left shoulder that started about 1 hours ago. Pt says pain better now. Denies any injury. Pain is not replicatable.

## 2015-07-17 ENCOUNTER — Other Ambulatory Visit (HOSPITAL_COMMUNITY): Payer: 59

## 2015-07-17 ENCOUNTER — Encounter (HOSPITAL_COMMUNITY): Payer: Self-pay | Admitting: *Deleted

## 2015-07-17 ENCOUNTER — Inpatient Hospital Stay (HOSPITAL_COMMUNITY): Payer: 59

## 2015-07-17 DIAGNOSIS — I214 Non-ST elevation (NSTEMI) myocardial infarction: Secondary | ICD-10-CM | POA: Diagnosis present

## 2015-07-17 DIAGNOSIS — Z79899 Other long term (current) drug therapy: Secondary | ICD-10-CM | POA: Diagnosis not present

## 2015-07-17 DIAGNOSIS — Z8673 Personal history of transient ischemic attack (TIA), and cerebral infarction without residual deficits: Secondary | ICD-10-CM | POA: Diagnosis not present

## 2015-07-17 DIAGNOSIS — Z87891 Personal history of nicotine dependence: Secondary | ICD-10-CM | POA: Diagnosis not present

## 2015-07-17 DIAGNOSIS — R079 Chest pain, unspecified: Secondary | ICD-10-CM

## 2015-07-17 DIAGNOSIS — Z7982 Long term (current) use of aspirin: Secondary | ICD-10-CM | POA: Diagnosis not present

## 2015-07-17 DIAGNOSIS — I2584 Coronary atherosclerosis due to calcified coronary lesion: Secondary | ICD-10-CM | POA: Diagnosis present

## 2015-07-17 DIAGNOSIS — R0789 Other chest pain: Secondary | ICD-10-CM

## 2015-07-17 DIAGNOSIS — I25110 Atherosclerotic heart disease of native coronary artery with unstable angina pectoris: Secondary | ICD-10-CM | POA: Diagnosis present

## 2015-07-17 DIAGNOSIS — M25512 Pain in left shoulder: Secondary | ICD-10-CM | POA: Diagnosis present

## 2015-07-17 DIAGNOSIS — I10 Essential (primary) hypertension: Secondary | ICD-10-CM | POA: Diagnosis present

## 2015-07-17 DIAGNOSIS — E785 Hyperlipidemia, unspecified: Secondary | ICD-10-CM | POA: Diagnosis present

## 2015-07-17 DIAGNOSIS — Z8249 Family history of ischemic heart disease and other diseases of the circulatory system: Secondary | ICD-10-CM | POA: Diagnosis not present

## 2015-07-17 LAB — CBC
HEMATOCRIT: 39.9 % (ref 39.0–52.0)
Hemoglobin: 13.9 g/dL (ref 13.0–17.0)
MCH: 30.2 pg (ref 26.0–34.0)
MCHC: 34.8 g/dL (ref 30.0–36.0)
MCV: 86.7 fL (ref 78.0–100.0)
PLATELETS: 218 10*3/uL (ref 150–400)
RBC: 4.6 MIL/uL (ref 4.22–5.81)
RDW: 13.3 % (ref 11.5–15.5)
WBC: 9.2 10*3/uL (ref 4.0–10.5)

## 2015-07-17 LAB — NM MYOCAR MULTI W/SPECT W/WALL MOTION / EF
CHL CUP MPHR: 149 {beats}/min
CSEPHR: 67 %
CSEPPHR: 100 {beats}/min
Rest HR: 79 {beats}/min

## 2015-07-17 LAB — BASIC METABOLIC PANEL
Anion gap: 10 (ref 5–15)
Anion gap: 11 (ref 5–15)
BUN: 6 mg/dL (ref 6–20)
BUN: 7 mg/dL (ref 6–20)
CALCIUM: 9.7 mg/dL (ref 8.9–10.3)
CHLORIDE: 102 mmol/L (ref 101–111)
CO2: 21 mmol/L — ABNORMAL LOW (ref 22–32)
CO2: 22 mmol/L (ref 22–32)
CREATININE: 0.97 mg/dL (ref 0.61–1.24)
CREATININE: 1.07 mg/dL (ref 0.61–1.24)
Calcium: 9.2 mg/dL (ref 8.9–10.3)
Chloride: 101 mmol/L (ref 101–111)
GFR calc Af Amer: >60 mL/min (ref >60)
GFR calc Af Amer: >60 mL/min (ref >60)
GLUCOSE: 121 mg/dL — AB (ref 65–99)
GLUCOSE: 135 mg/dL — AB (ref 65–99)
POTASSIUM: 4.1 mmol/L (ref 3.5–5.1)
POTASSIUM: 4 mmol/L (ref 3.5–5.1)
SODIUM: 133 mmol/L — AB (ref 135–145)
SODIUM: 134 mmol/L — AB (ref 135–145)

## 2015-07-17 LAB — TSH: TSH: 0.969 u[IU]/mL (ref 0.350–4.500)

## 2015-07-17 LAB — TROPONIN I
TROPONIN I: 0.05 ng/mL — AB (ref <0.031)
Troponin I: 0.04 ng/mL — ABNORMAL HIGH (ref <0.031)
Troponin I: 0.04 ng/mL — ABNORMAL HIGH (ref <0.031)
Troponin I: 0.05 ng/mL — ABNORMAL HIGH (ref <0.031)
Troponin I: 0.06 ng/mL — ABNORMAL HIGH (ref <0.031)

## 2015-07-17 MED ORDER — ATORVASTATIN CALCIUM 40 MG PO TABS
40.0000 mg | ORAL_TABLET | Freq: Every day | ORAL | Status: DC
  Administered 2015-07-17: 40 mg via ORAL
  Filled 2015-07-17: qty 1

## 2015-07-17 MED ORDER — SODIUM CHLORIDE 0.9 % IV SOLN: 250.0000 mL | INTRAVENOUS | Status: DC | PRN

## 2015-07-17 MED ORDER — PANTOPRAZOLE SODIUM 40 MG PO TBEC
40.0000 mg | DELAYED_RELEASE_TABLET | Freq: Every day | ORAL | Status: DC
  Administered 2015-07-17 – 2015-07-19 (×4): 40 mg via ORAL
  Filled 2015-07-17 (×4): qty 1

## 2015-07-17 MED ORDER — ENOXAPARIN SODIUM 100 MG/ML ~~LOC~~ SOLN
1.0000 mg/kg | Freq: Once | SUBCUTANEOUS | Status: AC
  Administered 2015-07-17: 120 mg via SUBCUTANEOUS
  Filled 2015-07-17: qty 2

## 2015-07-17 MED ORDER — ASPIRIN 81 MG PO CHEW
81.0000 mg | CHEWABLE_TABLET | ORAL | Status: AC
  Administered 2015-07-18: 81 mg via ORAL
  Filled 2015-07-17: qty 1

## 2015-07-17 MED ORDER — ENOXAPARIN SODIUM 120 MG/0.8ML ~~LOC~~ SOLN
SUBCUTANEOUS | Status: AC
  Filled 2015-07-17: qty 0.8

## 2015-07-17 MED ORDER — AMLODIPINE BESYLATE 10 MG PO TABS
10.0000 mg | ORAL_TABLET | Freq: Every day | ORAL | Status: DC
  Administered 2015-07-17 – 2015-07-19 (×3): 10 mg via ORAL
  Filled 2015-07-17 (×5): qty 1

## 2015-07-17 MED ORDER — SODIUM CHLORIDE 0.9 % WEIGHT BASED INFUSION: 3.0000 mL/kg/h | INTRAVENOUS | Status: DC

## 2015-07-17 MED ORDER — SODIUM CHLORIDE 0.9 % IJ SOLN: 3.0000 mL | INTRAMUSCULAR | Status: DC | PRN

## 2015-07-17 MED ORDER — TECHNETIUM TC 99M SESTAMIBI GENERIC - CARDIOLITE
10.0000 | Freq: Once | INTRAVENOUS | Status: AC | PRN
  Administered 2015-07-17: 10 via INTRAVENOUS

## 2015-07-17 MED ORDER — REGADENOSON 0.4 MG/5ML IV SOLN
0.4000 mg | Freq: Once | INTRAVENOUS | Status: DC
  Filled 2015-07-17: qty 5

## 2015-07-17 MED ORDER — ASPIRIN EC 81 MG PO TBEC
81.0000 mg | DELAYED_RELEASE_TABLET | Freq: Every day | ORAL | Status: DC
  Administered 2015-07-17: 81 mg via ORAL
  Filled 2015-07-17 (×2): qty 1

## 2015-07-17 MED ORDER — METOPROLOL SUCCINATE ER 50 MG PO TB24
50.0000 mg | ORAL_TABLET | Freq: Every day | ORAL | Status: DC
  Administered 2015-07-17 – 2015-07-19 (×3): 50 mg via ORAL
  Filled 2015-07-17 (×4): qty 1

## 2015-07-17 MED ORDER — SODIUM CHLORIDE 0.9 % IJ SOLN
3.0000 mL | Freq: Two times a day (BID) | INTRAMUSCULAR | Status: DC
  Administered 2015-07-17: 3 mL via INTRAVENOUS

## 2015-07-17 MED ORDER — REGADENOSON 0.4 MG/5ML IV SOLN
INTRAVENOUS | Status: AC
  Filled 2015-07-17: qty 5

## 2015-07-17 MED ORDER — ALBUTEROL SULFATE (2.5 MG/3ML) 0.083% IN NEBU: 2.5000 mg | INHALATION_SOLUTION | Freq: Four times a day (QID) | RESPIRATORY_TRACT | Status: DC | PRN

## 2015-07-17 MED ORDER — BENAZEPRIL HCL 20 MG PO TABS
20.0000 mg | ORAL_TABLET | Freq: Every day | ORAL | Status: DC
  Administered 2015-07-17 – 2015-07-18 (×2): 20 mg via ORAL
  Filled 2015-07-17 (×6): qty 1

## 2015-07-17 MED ORDER — REGADENOSON 0.4 MG/5ML IV SOLN
0.4000 mg | Freq: Once | INTRAVENOUS | Status: AC
  Administered 2015-07-17: 0.4 mg via INTRAVENOUS
  Filled 2015-07-17: qty 5

## 2015-07-17 MED ORDER — SODIUM CHLORIDE 0.9 % WEIGHT BASED INFUSION
1.0000 mL/kg/h | INTRAVENOUS | Status: DC
  Administered 2015-07-18: 1 mL/kg/h via INTRAVENOUS

## 2015-07-17 MED ORDER — TECHNETIUM TC 99M SESTAMIBI GENERIC - CARDIOLITE
30.0000 | Freq: Once | INTRAVENOUS | Status: AC | PRN
  Administered 2015-07-17: 30 via INTRAVENOUS

## 2015-07-17 NOTE — Progress Notes (Signed)
     The patient was seen in nuclear medicine for a lexiscan myoview. He tolerated the procedure well. Await nuclear images.   Kathryn Stern PA-C  [facility]    

## 2015-07-17 NOTE — Progress Notes (Signed)
    NAME:  Mitchell Oliver   MRN: [id number] DOB:  1946-05-30   ADMIT DATE: 07/16/2015  Performing MD: Dr Tamala Julian   Procedure:  Cardiac cath  The procedure with Risks/Benefits/Alternatives and Indications was reviewed with the patient.  All questions were answered.    Risks / Complications include, but not limited to: Death, MI, CVA/TIA, VF/VT (with defibrillation), Bradycardia (need for temporary pacer placement), contrast induced nephropathy  bleeding / bruising / hematoma / pseudoaneurysm, vascular or coronary injury (with possible emergent CT or Vascular Surgery), adverse medication reactions, infection.    The patient ( and family) voice understanding and agree to proceed.      Eileen Stanford, PA-C  07/17/2015 3:38 PM

## 2015-07-17 NOTE — H&P (Signed)
C.c chest pain HPI:  69 y/o man with pmh of htn, hlp, cva came as a transfer from [facility] ED where he presented with chest pain and left shoulder pain. His initial labs showed mildly elevated troponins. He was give a dose of lovenox and transferred her for further cardiac workup. He is currently chest pain freen Non- Smoker    Review of Systems:     Cardiac Review of Systems: {Y] = yes [ ]  = no  Chest Pain [    ]  Resting SOB [   ] Exertional SOB  [  ]  Orthopnea [  ]   Pedal Edema [   ]    Palpitations [  ] Syncope  [  ]   Presyncope [   ]  General Review of Systems: [Y] = yes [  ]=no Constitional: recent weight change [  ]; anorexia [  ]; fatigue [  ]; nausea [  ]; night sweats [  ]; fever [  ]; or chills [  ];                                                                     Dental: poor dentition[  ];   Eye : blurred vision [  ]; diplopia [   ]; vision changes [  ];  Amaurosis fugax[  ]; Resp: cough [  ];  wheezing[  ];  hemoptysis[  ]; shortness of breath[  ]; paroxysmal nocturnal dyspnea[  ]; dyspnea on exertion[  ]; or orthopnea[  ];  GI:  gallstones[  ], vomiting[  ];  dysphagia[  ]; melena[  ];  hematochezia [  ]; heartburn[  ];   GU: kidney stones [  ]; hematuria[  ];   dysuria [  ];  nocturia[  ];               Skin: rash [  ], swelling[  ];, hair loss[  ];  peripheral edema[  ];  or itching[  ]; Musculosketetal: myalgias[  ];  joint swelling[  ];  joint erythema[  ];  joint pain[  ];  back pain[  ];  Heme/Lymph: bruising[  ];  bleeding[  ];  anemia[  ];  Neuro: TIA[  ];  headaches[  ];  stroke[  ];  vertigo[  ];  seizures[  ];   paresthesias[  ];  difficulty walking[  ];  Psych:depression[  ]; anxiety[  ];  Endocrine: diabetes[  ];  thyroid dysfunction[  ];  Other:  Past Medical History  Diagnosis Date  . Hypertension   . Stroke   . Allergy   . IFG (impaired fasting glucose)   . ED (erectile dysfunction)   . CVA (cerebral vascular accident)     No current  facility-administered medications on file prior to encounter.   Current Outpatient Prescriptions on File Prior to Encounter  Medication Sig Dispense Refill  . albuterol (PROVENTIL HFA;VENTOLIN HFA) 108 (90 BASE) MCG/ACT inhaler Inhale 2 puffs into the lungs every 6 (six) hours as needed for wheezing. 1 Inhaler 2  . amLODipine (NORVASC) 10 MG tablet Take 1 tablet (10 mg total) by mouth daily. 30 tablet 5  . Ascorbic Acid (VITAMIN C PO) Take 1  tablet by mouth daily.     Marland Kitchen aspirin EC 81 MG tablet Take 81 mg by mouth daily.    Marland Kitchen atorvastatin (LIPITOR) 40 MG tablet Take 1 tablet (40 mg total) by mouth daily. 30 tablet 5  . benazepril (LOTENSIN) 20 MG tablet Take 1 tablet (20 mg total) by mouth daily. 30 tablet 5  . Cholecalciferol (VITAMIN D PO) Take 1,000 Units by mouth daily.     . fish oil-omega-3 fatty acids 1000 MG capsule Take 2 g by mouth daily.    . fluticasone (FLONASE) 50 MCG/ACT nasal spray Place 1 spray into both nostrils daily as needed for allergies or rhinitis.     . metoprolol succinate (TOPROL-XL) 50 MG 24 hr tablet Take 1 tablet (50 mg total) by mouth daily. 30 tablet 5  . pantoprazole (PROTONIX) 40 MG tablet Take 1 tablet (40 mg total) by mouth daily before breakfast. 30 tablet 5  . tadalafil (CIALIS) 20 MG tablet Take 1 tablet 2 hours prior to sex (Patient taking differently: Take 20 mg by mouth daily as needed for erectile dysfunction. Take 1 tablet 2 hours prior to sex) 10 tablet 5     No Known Allergies  Social History   Social History  . Marital Status: Divorced    Spouse Name: N/A  . Number of Children: N/A  . Years of Education: N/A   Occupational History  . Not on file.   Social History Main Topics  . Smoking status: Former Research scientist (life sciences)  . Smokeless tobacco: Not on file     Comment: Quit since 1987  . Alcohol Use: Yes  . Drug Use: No  . Sexual Activity: Not on file   Other Topics Concern  . Not on file   Social History Narrative    Family History    Problem Relation Age of Onset  . Hypertension Mother   . Hypertension Father   . Colon cancer Neg Hx   . Colon polyps Neg Hx     PHYSICAL EXAM: Filed Vitals:   07/17/15 0417  BP: 168/90  Pulse: 61  Temp: 97.5 F (36.4 C)  Resp: 18   General:  Well appearing. No respiratory difficulty HEENT: normal Neck: supple. no JVD. Carotids 2+ bilat; no bruits. No lymphadenopathy or thryomegaly appreciated. Cor: PMI nondisplaced. Regular rate & rhythm. No rubs, gallops or murmurs. Lungs: clear Abdomen: soft, nontender, nondistended. Extremities: no cyanosis, clubbing, rash, edema Neuro: alert & oriented x 3, cranial nerves grossly intact. moves all 4 extremities w/o difficulty. Affect pleasant.  ECG:  Results for orders placed or performed during the hospital encounter of 07/16/15 (from the past 24 hour(s))  CBC with Differential     Status: Abnormal   Collection Time: 07/16/15 11:37 PM  Result Value Ref Range   WBC 12.4 (H) 4.0 - 10.5 K/uL   RBC 4.69 4.22 - 5.81 MIL/uL   Hemoglobin 14.5 13.0 - 17.0 g/dL   HCT 40.9 39.0 - 52.0 %   MCV 87.2 78.0 - 100.0 fL   MCH 30.9 26.0 - 34.0 pg   MCHC 35.5 30.0 - 36.0 g/dL   RDW 13.2 11.5 - 15.5 %   Platelets 262 150 - 400 K/uL   Neutrophils Relative % 67 %   Neutro Abs 8.3 (H) 1.7 - 7.7 K/uL   Lymphocytes Relative 22 %   Lymphs Abs 2.7 0.7 - 4.0 K/uL   Monocytes Relative 8 %   Monocytes Absolute 1.0 0.1 - 1.0 K/uL   Eosinophils Relative  2 %   Eosinophils Absolute 0.3 0.0 - 0.7 K/uL   Basophils Relative 1 %   Basophils Absolute 0.1 0.0 - 0.1 K/uL  Basic metabolic panel     Status: Abnormal   Collection Time: 07/16/15 11:37 PM  Result Value Ref Range   Sodium 134 (L) 135 - 145 mmol/L   Potassium 4.0 3.5 - 5.1 mmol/L   Chloride 102 101 - 111 mmol/L   CO2 22 22 - 32 mmol/L   Glucose, Bld 135 (H) 65 - 99 mg/dL   BUN 7 6 - 20 mg/dL   Creatinine, Ser 1.07 0.61 - 1.24 mg/dL   Calcium 9.2 8.9 - 10.3 mg/dL   GFR calc non Af Amer >60 >60  mL/min   GFR calc Af Amer >60 >60 mL/min   Anion gap 10 5 - 15  Troponin I     Status: Abnormal   Collection Time: 07/16/15 11:37 PM  Result Value Ref Range   Troponin I 0.04 (H) <0.031 ng/mL  Troponin I     Status: Abnormal   Collection Time: 07/17/15  2:10 AM  Result Value Ref Range   Troponin I 0.06 (H) <0.031 ng/mL   Dg Chest 2 View  07/17/2015   CLINICAL DATA:  LEFT shoulder pain radiating to LEFT scapula after eating dinner around 9 p.m. symptoms now resolved. Former smoker, hypertension, history of stroke.  EXAM: CHEST  2 VIEW  COMPARISON:  Chest radiograph May 03, 2015  FINDINGS: The cardiac silhouette is upper limits of normal in size, unchanged. Mediastinal silhouette is nonsuspicious. Mild chronic interstitial changes increased lung volumes compatible with COPD. No pleural effusion or focal consolidation. No pneumothorax. Soft tissue planes and included osseous structures are nonsuspicious. Mild degenerative change of the lower thoracic spine.  IMPRESSION: Borderline cardiomegaly and COPD; no superimposed acute cardiopulmonary process.   Electronically Signed   By: Elon Alas M.D.   On: 07/17/2015 00:38     ASSESSMENT: NSTEMI HTN HLP  PLAN/DISCUSSION:  1. NSTEMI received a dose of lovenox in [place] ED 2. NPO  3. Cath this am 4. Echo 5. Labs  6. Continue home meds  Mitchell Oliver

## 2015-07-17 NOTE — Progress Notes (Signed)
Patient Name: Mitchell Oliver Date of Encounter: 07/17/2015     Active Problems:   NSTEMI (non-ST elevated myocardial infarction)    SUBJECTIVE  He presented to [facility] with left shoulder and neck pain while eating dinner last night. He called his daughter who brought him to [facility]. He denies chest pain or associated SOB, diaphoresis or nausea. He has not had anymore sx since that time. He had recent back surgery and got cleared to exercise in October. He denies any exertional CP or SOB. His father had MI in his 77s. Otherwise no fam hx of CAD.  CURRENT MEDS . amLODipine  10 mg Oral Daily  . aspirin EC  81 mg Oral Daily  . atorvastatin  40 mg Oral q1800  . benazepril  20 mg Oral Daily  . metoprolol succinate  50 mg Oral Daily  . pantoprazole  40 mg Oral QAC breakfast  . sodium chloride  3 mL Intravenous Q12H    OBJECTIVE  Filed Vitals:   07/17/15 0200 07/17/15 0230 07/17/15 0300 07/17/15 0417  BP: 151/79 144/80 152/90 168/90  Pulse: 75 71 70 61  Temp:    97.5 F (36.4 C)  TempSrc:    Oral  Resp: 19 16 20 18   Height:    6\' 2"  (1.88 m)  Weight:    262 lb 9.6 oz (119.115 kg)  SpO2: 96% 94% 94% 98%    Intake/Output Summary (Last 24 hours) at 07/17/15 0809 Last data filed at 07/17/15 0207  Gross per 24 hour  Intake      0 ml  Output    750 ml  Net   -750 ml   Filed Weights   07/16/15 2158 07/17/15 0417  Weight: 261 lb (118.389 kg) 262 lb 9.6 oz (119.115 kg)    PHYSICAL EXAM  General: Pleasant, NAD. overweight Neuro: Alert and oriented X 3. Moves all extremities spontaneously. Psych: Normal affect. HEENT:  Normal  Neck: Supple without bruits or JVD. Lungs:  Resp regular and unlabored, CTA. Heart: RRR no s3, s4, or murmurs. Abdomen: Soft, non-tender, non-distended, BS + x 4.  Extremities: No clubbing, cyanosis or edema. DP/PT/Radials 2+ and equal bilaterally.  Accessory Clinical Findings  CBC  Recent Labs  07/16/15 2337 07/17/15 0543  WBC 12.4* 9.2    NEUTROABS 8.3*  --   HGB 14.5 13.9  HCT 40.9 39.9  MCV 87.2 86.7  PLT 262 161   Basic Metabolic Panel  Recent Labs  07/16/15 2337 07/17/15 0543  NA 134* 133*  K 4.0 4.1  CL 102 101  CO2 22 21*  GLUCOSE 135* 121*  BUN 7 6  CREATININE 1.07 0.97  CALCIUM 9.2 9.7     Cardiac Enzymes  Recent Labs  07/16/15 2337 07/17/15 0210 07/17/15 0543  TROPONINI 0.04* 0.06* 0.05*   Thyroid Function Tests  Recent Labs  07/17/15 0543  TSH 0.969    TELE  NSR with some sinus brady  Radiology/Studies  Dg Chest 2 View  07/17/2015   CLINICAL DATA:  LEFT shoulder pain radiating to LEFT scapula after eating dinner around 9 p.m. symptoms now resolved. Former smoker, hypertension, history of stroke.  EXAM: CHEST  2 VIEW  COMPARISON:  Chest radiograph May 03, 2015  FINDINGS: The cardiac silhouette is upper limits of normal in size, unchanged. Mediastinal silhouette is nonsuspicious. Mild chronic interstitial changes increased lung volumes compatible with COPD. No pleural effusion or focal consolidation. No pneumothorax. Soft tissue planes and included osseous structures are nonsuspicious.  Mild degenerative change of the lower thoracic spine.  IMPRESSION: Borderline cardiomegaly and COPD; no superimposed acute cardiopulmonary process.   Electronically Signed   By: Elon Alas M.D.   On: 07/17/2015 00:38    ASSESSMENT AND PLAN  69 y/o man with pmh of HTN, HLD, hx of tobacco abuse and CVA who was transferred to [facility] from [facility] ED early this morning  for NSTEMI.  He presented with left shoulder pain and neck pain that has now completely resolved.    NSTEMI- troponin elevation mild and flat. No further pain. 0.04--> 0.06--> 0.05.  -- Continue ASA, statin and BB.  -- Will add on for lexiscan myoview today (recent back surgery and not cleared for exercise until October)  HTN- mildly elevated. He says his BP is always perfect at home and he checks it twice a day.  -- Continue home  amlodipine 10mg , toprol XL 50mg  and benazepril 20mg   HLD- LDL 63. Continue statin   Judy Pimple PA-C  Pager [phone]  Atypical pain flat troponin asymptomatic this am  Ok to have lexiscan myovue today Continue 3 drug Rx for HTN.  Further recommendations based on myovue results Exam benign with no murmurs and clear lung fields  Jenkins Rouge

## 2015-07-17 NOTE — ED Notes (Signed)
Pt ambulated to restroom & returned to room w/ no complications. 

## 2015-07-17 NOTE — ED Notes (Signed)
Pt informed room number had been changed from 7 to 14, so he could inform his family that has already left & was given the original room number assigned.

## 2015-07-18 ENCOUNTER — Inpatient Hospital Stay (HOSPITAL_COMMUNITY): Payer: 59

## 2015-07-18 ENCOUNTER — Other Ambulatory Visit: Payer: Self-pay

## 2015-07-18 ENCOUNTER — Encounter (HOSPITAL_COMMUNITY)
Admission: EM | Disposition: A | Discharge status: Home Health | Payer: Self-pay | Source: Home / Self Care | Attending: Cardiology

## 2015-07-18 ENCOUNTER — Encounter (HOSPITAL_COMMUNITY): Payer: Self-pay | Admitting: Interventional Cardiology

## 2015-07-18 DIAGNOSIS — I10 Essential (primary) hypertension: Secondary | ICD-10-CM

## 2015-07-18 DIAGNOSIS — R079 Chest pain, unspecified: Secondary | ICD-10-CM

## 2015-07-18 DIAGNOSIS — E785 Hyperlipidemia, unspecified: Secondary | ICD-10-CM

## 2015-07-18 DIAGNOSIS — I25110 Atherosclerotic heart disease of native coronary artery with unstable angina pectoris: Secondary | ICD-10-CM

## 2015-07-18 HISTORY — PX: CARDIAC CATHETERIZATION: SHX172

## 2015-07-18 LAB — POCT ACTIVATED CLOTTING TIME: Activated Clotting Time: 503 seconds

## 2015-07-18 LAB — PROTIME-INR
INR: 1.11 (ref 0.00–1.49)
PROTHROMBIN TIME: 14.5 s (ref 11.6–15.2)

## 2015-07-18 SURGERY — LEFT HEART CATH AND CORONARY ANGIOGRAPHY
Anesthesia: LOCAL

## 2015-07-18 MED ORDER — ATROPINE SULFATE 0.1 MG/ML IJ SOLN
INTRAMUSCULAR | Status: DC | PRN
  Administered 2015-07-18: 0.5 mg via INTRAVENOUS

## 2015-07-18 MED ORDER — SODIUM CHLORIDE 0.9 % IJ SOLN: 3.0000 mL | Freq: Two times a day (BID) | INTRAMUSCULAR | Status: DC

## 2015-07-18 MED ORDER — OXYCODONE-ACETAMINOPHEN 5-325 MG PO TABS: 1.0000 | ORAL_TABLET | ORAL | Status: DC | PRN

## 2015-07-18 MED ORDER — ACETAMINOPHEN 325 MG PO TABS
650.0000 mg | ORAL_TABLET | Freq: Four times a day (QID) | ORAL | Status: DC | PRN
  Administered 2015-07-18: 650 mg via ORAL
  Filled 2015-07-18: qty 2

## 2015-07-18 MED ORDER — NITROGLYCERIN 1 MG/10 ML FOR IR/CATH LAB
INTRA_ARTERIAL | Status: DC | PRN
  Administered 2015-07-18 (×2): 200 ug via INTRACORONARY

## 2015-07-18 MED ORDER — ONDANSETRON HCL 4 MG/2ML IJ SOLN: 4.0000 mg | Freq: Four times a day (QID) | INTRAMUSCULAR | Status: DC | PRN

## 2015-07-18 MED ORDER — SODIUM CHLORIDE 0.9 % IV SOLN
INTRAVENOUS | Status: DC | PRN
  Administered 2015-07-18: 100 mL/h via INTRAVENOUS

## 2015-07-18 MED ORDER — MIDAZOLAM HCL 2 MG/2ML IJ SOLN
INTRAMUSCULAR | Status: AC
  Filled 2015-07-18: qty 4

## 2015-07-18 MED ORDER — VERAPAMIL HCL 2.5 MG/ML IV SOLN
INTRAVENOUS | Status: DC | PRN
  Administered 2015-07-18: 09:00:00 via INTRA_ARTERIAL

## 2015-07-18 MED ORDER — ATORVASTATIN CALCIUM 80 MG PO TABS
80.0000 mg | ORAL_TABLET | Freq: Every day | ORAL | Status: DC
  Administered 2015-07-18: 80 mg via ORAL
  Filled 2015-07-18: qty 1

## 2015-07-18 MED ORDER — ACETAMINOPHEN 325 MG PO TABS: 650.0000 mg | ORAL_TABLET | ORAL | Status: DC | PRN

## 2015-07-18 MED ORDER — MIDAZOLAM HCL 2 MG/2ML IJ SOLN
INTRAMUSCULAR | Status: DC | PRN
  Administered 2015-07-18 (×2): 1 mg via INTRAVENOUS

## 2015-07-18 MED ORDER — HEPARIN SODIUM (PORCINE) 1000 UNIT/ML IJ SOLN
INTRAMUSCULAR | Status: AC
  Filled 2015-07-18: qty 1

## 2015-07-18 MED ORDER — IOHEXOL 350 MG/ML SOLN
INTRAVENOUS | Status: DC | PRN
  Administered 2015-07-18: 400 mL via INTRAVENOUS

## 2015-07-18 MED ORDER — BIVALIRUDIN BOLUS VIA INFUSION - CUPID
INTRAVENOUS | Status: DC | PRN
  Administered 2015-07-18: 88.05 mg via INTRAVENOUS

## 2015-07-18 MED ORDER — NITROGLYCERIN 1 MG/10 ML FOR IR/CATH LAB
INTRA_ARTERIAL | Status: AC
  Filled 2015-07-18: qty 10

## 2015-07-18 MED ORDER — SODIUM CHLORIDE 0.9 % IV SOLN
250.0000 mg | INTRAVENOUS | Status: DC | PRN
  Administered 2015-07-18: 1.75 mg/kg/h via INTRAVENOUS

## 2015-07-18 MED ORDER — HEPARIN (PORCINE) IN NACL 2-0.9 UNIT/ML-% IJ SOLN
INTRAMUSCULAR | Status: AC
  Filled 2015-07-18: qty 1000

## 2015-07-18 MED ORDER — SODIUM CHLORIDE 0.9 % IV SOLN
250.0000 mL | INTRAVENOUS | Status: DC | PRN
Start: 2015-07-18 — End: 2015-07-19

## 2015-07-18 MED ORDER — FENTANYL CITRATE (PF) 100 MCG/2ML IJ SOLN
INTRAMUSCULAR | Status: AC
  Filled 2015-07-18: qty 4

## 2015-07-18 MED ORDER — SODIUM CHLORIDE 0.9 % IV SOLN
1.7500 mg/kg/h | INTRAVENOUS | Status: DC
  Filled 2015-07-18: qty 250

## 2015-07-18 MED ORDER — HEPARIN (PORCINE) IN NACL 2-0.9 UNIT/ML-% IJ SOLN
INTRAMUSCULAR | Status: AC
  Filled 2015-07-18: qty 500

## 2015-07-18 MED ORDER — SODIUM CHLORIDE 0.9 % WEIGHT BASED INFUSION: 1.0000 mL/kg/h | INTRAVENOUS | Status: AC

## 2015-07-18 MED ORDER — DOPAMINE-DEXTROSE 3.2-5 MG/ML-% IV SOLN
INTRAVENOUS | Status: DC | PRN
  Administered 2015-07-18: 10 ug/kg/min via INTRAVENOUS

## 2015-07-18 MED ORDER — ONDANSETRON HCL 4 MG/2ML IJ SOLN
INTRAMUSCULAR | Status: DC | PRN
Start: 2015-07-18 — End: 2015-07-18
  Administered 2015-07-18: 4 mg via INTRAVENOUS

## 2015-07-18 MED ORDER — TICAGRELOR 90 MG PO TABS
ORAL_TABLET | ORAL | Status: DC | PRN
  Administered 2015-07-18: 180 mg via ORAL

## 2015-07-18 MED ORDER — SODIUM CHLORIDE 0.9 % IJ SOLN: 3.0000 mL | INTRAMUSCULAR | Status: DC | PRN

## 2015-07-18 MED ORDER — VERAPAMIL HCL 2.5 MG/ML IV SOLN
INTRAVENOUS | Status: AC
  Filled 2015-07-18: qty 2

## 2015-07-18 MED ORDER — TICAGRELOR 90 MG PO TABS
90.0000 mg | ORAL_TABLET | Freq: Two times a day (BID) | ORAL | Status: DC
  Administered 2015-07-18 – 2015-07-19 (×2): 90 mg via ORAL
  Filled 2015-07-18 (×2): qty 1

## 2015-07-18 MED ORDER — HEPARIN SODIUM (PORCINE) 1000 UNIT/ML IJ SOLN
INTRAMUSCULAR | Status: DC | PRN
  Administered 2015-07-18: 5000 [IU] via INTRAVENOUS

## 2015-07-18 MED ORDER — BIVALIRUDIN 250 MG IV SOLR
INTRAVENOUS | Status: AC
Start: 2015-07-18 — End: 2015-07-18
  Filled 2015-07-18: qty 250

## 2015-07-18 MED ORDER — ASPIRIN 81 MG PO CHEW
81.0000 mg | CHEWABLE_TABLET | Freq: Every day | ORAL | Status: DC
  Administered 2015-07-19: 81 mg via ORAL
  Filled 2015-07-18: qty 1

## 2015-07-18 MED ORDER — LIDOCAINE HCL (PF) 1 % IJ SOLN
INTRAMUSCULAR | Status: DC | PRN
  Administered 2015-07-18: 5 mL via SUBCUTANEOUS

## 2015-07-18 MED ORDER — ATROPINE SULFATE 0.1 MG/ML IJ SOLN
INTRAMUSCULAR | Status: AC
  Filled 2015-07-18: qty 10

## 2015-07-18 MED ORDER — FENTANYL CITRATE (PF) 100 MCG/2ML IJ SOLN
INTRAMUSCULAR | Status: DC | PRN
  Administered 2015-07-18 (×3): 50 ug via INTRAVENOUS

## 2015-07-18 MED ORDER — NITROGLYCERIN 1 MG/10 ML FOR IR/CATH LAB
INTRA_ARTERIAL | Status: DC | PRN
  Administered 2015-07-18: 11:00:00

## 2015-07-18 SURGICAL SUPPLY — 19 items
BALLN ANGIOSCULPT RX 3.0X6 (BALLOONS) ×2
BALLN EUPHORA RX 2.75X6 (BALLOONS) ×2
BALLOON ANGIOSCULPT RX 3.0X6 (BALLOONS) ×1 IMPLANT
BALLOON EUPHORA RX 2.75X6 (BALLOONS) ×1 IMPLANT
CATH INFINITI 5 FR JL3.5 (CATHETERS) ×2 IMPLANT
CATH INFINITI JR4 5F (CATHETERS) ×2 IMPLANT
CATH VISTA GUIDE 6FR XBRCA (CATHETERS) ×2 IMPLANT
DEVICE RAD COMP TR BAND LRG (VASCULAR PRODUCTS) ×2 IMPLANT
GLIDESHEATH SLEND A-KIT 6F 22G (SHEATH) ×2 IMPLANT
GUIDE CATH RUNWAY 6FR FR4 (CATHETERS) ×2 IMPLANT
KIT ENCORE 26 ADVANTAGE (KITS) ×2 IMPLANT
KIT HEART LEFT (KITS) ×2 IMPLANT
PACK CARDIAC CATHETERIZATION (CUSTOM PROCEDURE TRAY) ×2 IMPLANT
STENT PROMUS PREM MR 2.75X12 (Permanent Stent) ×2 IMPLANT
TRANSDUCER W/STOPCOCK (MISCELLANEOUS) ×2 IMPLANT
TUBING CIL FLEX 10 FLL-RA (TUBING) ×2 IMPLANT
WIRE ASAHI PROWATER 180CM (WIRE) ×4 IMPLANT
WIRE HI TORQ BMW 190CM (WIRE) ×2 IMPLANT
WIRE SAFE-T 1.5MM-J .035X260CM (WIRE) ×2 IMPLANT

## 2015-07-18 NOTE — Progress Notes (Signed)
Patient ID: [name], male   DOB: [birth year], 69 y.o.   MRN: [id number]   Patient Name: Mitchell Oliver Date of Encounter: 07/18/2015     Active Problems:   NSTEMI (non-ST elevated myocardial infarction)   Chest pain    SUBJECTIVE  He presented to [facility] with left shoulder and neck pain while eating dinner last night. He called his daughter who brought him to [facility]. He denies chest pain or associated SOB, diaphoresis or nausea. He has not had anymore sx since that time. He had recent back surgery and got cleared to exercise in October. He denies any exertional CP or SOB. His father had MI in his 71s. Otherwise no fam hx of CAD.  CURRENT MEDS . amLODipine  10 mg Oral Daily  . aspirin EC  81 mg Oral Daily  . atorvastatin  40 mg Oral q1800  . benazepril  20 mg Oral Daily  . metoprolol succinate  50 mg Oral Daily  . pantoprazole  40 mg Oral QAC breakfast  . regadenoson  0.4 mg Intravenous Once  . sodium chloride  3 mL Intravenous Q12H  . sodium chloride  3 mL Intravenous Q12H    OBJECTIVE  Filed Vitals:   07/18/15 0110 07/18/15 0245 07/18/15 0609 07/18/15 0800  BP: 149/76  152/76 126/81  Pulse: 75  74 65  Temp: 97.9 F (36.6 C)  98 F (36.7 C) 97.8 F (36.6 C)  TempSrc: Oral  Oral Oral  Resp: 21  20   Height:      Weight:   117.391 kg (258 lb 12.8 oz)   SpO2: 94% 97% 94% 99%    Intake/Output Summary (Last 24 hours) at 07/18/15 0829 Last data filed at 07/18/15 0800  Gross per 24 hour  Intake    880 ml  Output   1800 ml  Net   -920 ml   Filed Weights   07/16/15 2158 07/17/15 0417 07/18/15 0609  Weight: 118.389 kg (261 lb) 119.115 kg (262 lb 9.6 oz) 117.391 kg (258 lb 12.8 oz)    PHYSICAL EXAM  General: Pleasant, NAD. overweight Neuro: Alert and oriented X 3. Moves all extremities spontaneously. Psych: Normal affect. HEENT:  Normal  Neck: Supple without bruits or JVD. Lungs:  Resp regular and unlabored, CTA. Heart: RRR no s3, s4, or murmurs. Abdomen: Soft,  non-tender, non-distended, BS + x 4.  Extremities: No clubbing, cyanosis or edema. DP/PT/Radials 2+ and equal bilaterally.  Accessory Clinical Findings  CBC  Recent Labs  07/16/15 2337 07/17/15 0543  WBC 12.4* 9.2  NEUTROABS 8.3*  --   HGB 14.5 13.9  HCT 40.9 39.9  MCV 87.2 86.7  PLT 262 765   Basic Metabolic Panel  Recent Labs  07/16/15 2337 07/17/15 0543  NA 134* 133*  K 4.0 4.1  CL 102 101  CO2 22 21*  GLUCOSE 135* 121*  BUN 7 6  CREATININE 1.07 0.97  CALCIUM 9.2 9.7     Cardiac Enzymes  Recent Labs  07/17/15 0543 07/17/15 1212 07/17/15 1640  TROPONINI 0.05* 0.05* 0.04*   Thyroid Function Tests  Recent Labs  07/17/15 0543  TSH 0.969    TELE  NSR with some sinus brady  Radiology/Studies  Dg Chest 2 View  07/17/2015   CLINICAL DATA:  LEFT shoulder pain radiating to LEFT scapula after eating dinner around 9 p.m. symptoms now resolved. Former smoker, hypertension, history of stroke.  EXAM: CHEST  2 VIEW  COMPARISON:  Chest radiograph May 03, 2015  FINDINGS: The cardiac silhouette is upper limits of normal in size, unchanged. Mediastinal silhouette is nonsuspicious. Mild chronic interstitial changes increased lung volumes compatible with COPD. No pleural effusion or focal consolidation. No pneumothorax. Soft tissue planes and included osseous structures are nonsuspicious. Mild degenerative change of the lower thoracic spine.  IMPRESSION: Borderline cardiomegaly and COPD; no superimposed acute cardiopulmonary process.   Electronically Signed   By: Mitchell Oliver M.D.   On: 07/17/2015 00:38   Nm Myocar Multi W/spect W/wall Motion / Ef  07/17/2015   CLINICAL DATA:  Chest pain. Initial encounter. Myocardial ischemia.  EXAM: MYOCARDIAL IMAGING WITH SPECT (REST AND PHARMACOLOGIC-STRESS)  GATED LEFT VENTRICULAR WALL MOTION STUDY  LEFT VENTRICULAR EJECTION FRACTION  TECHNIQUE: Standard myocardial SPECT imaging was performed after resting intravenous injection of  10 mCi Tc-39m sestamibi. Subsequently, intravenous infusion of Lexiscan was performed under the supervision of the Cardiology staff. At peak effect of the drug, 30 mCi Tc-79m sestamibi was injected intravenously and standard myocardial SPECT imaging was performed. Quantitative gated imaging was also performed to evaluate left ventricular wall motion, and estimate left ventricular ejection fraction.  COMPARISON:  Chest radiograph 07/16/2015.  FINDINGS: Perfusion: There is a moderate area of decreased perfusion under stress imaging in the distal anterior wall extending to the apex. Radiotracer uptake is moderately decreased. No other areas of inducible ischemia.  Wall Motion: Mild dyskinesia corresponding to the area of inducible ischemia.  Left Ventricular Ejection Fraction: 61 %  End diastolic volume 825 ml  End systolic volume 64 ml  IMPRESSION: 1. Moderate area of anterior inferior inducible ischemia extending to the apex.  2. Mild dyskinesia in the area of inducible ischemia.  3. Left ventricular ejection fraction 61%  4. Intermediate-risk stress test findings*.  *2012 Appropriate Use Criteria for Coronary Revascularization Focused Update: J Am Coll Cardiol. 0539;76(7):341-937. http://content.airportbarriers.com.aspx?articleid=1201161   Electronically Signed   By: Dereck Ligas M.D.   On: 07/17/2015 14:17    ASSESSMENT AND PLAN  69 y/o man with pmh of HTN, HLD, hx of tobacco abuse and CVA who was transferred to [facility] from [facility] ED early this morning  for NSTEMI.  He presented with left shoulder pain and neck pain that has now completely resolved.    NSTEMI- troponin elevation mild and flat. No further pain. 0.04--> 0.06--> 0.05.  -- Continue ASA, statin and BB.  --  myovue suggests anterior ischemia  Cath today   HTN- mildly elevated. He says his BP is always perfect at home and he checks it twice a day.  -- Continue home amlodipine 10mg , toprol XL 50mg  and benazepril 20mg   HLD- LDL 63.  Continue statin   Signed, Mitchell Rouge PA-C

## 2015-07-18 NOTE — Progress Notes (Signed)
Notified lab to come draw the patient's PT and INR.

## 2015-07-18 NOTE — Progress Notes (Signed)
UR COMPLETED  

## 2015-07-18 NOTE — Progress Notes (Signed)
TR BAND REMOVAL  LOCATION:    right radial  DEFLATED PER PROTOCOL:    Yes.    TIME BAND OFF / DRESSING APPLIED:    1700   SITE UPON ARRIVAL:    Level 0  SITE AFTER BAND REMOVAL:    Level 0  CIRCULATION SENSATION AND MOVEMENT:    Within Normal Limits   Yes.    COMMENTS:   Rechecked site during remainder of shift with no change in assessment, dressing dry and intact

## 2015-07-18 NOTE — Progress Notes (Signed)
  Echocardiogram 2D Echocardiogram has been performed.  Mitchell Oliver 07/18/2015, 4:19 PM

## 2015-07-18 NOTE — Interval H&P Note (Signed)
Cath Lab Visit (complete for each Cath Lab visit)  Clinical Evaluation Leading to the Procedure:   ACS: Yes.    Non-ACS:    Anginal Classification: CCS III  Anti-ischemic medical therapy: Minimal Therapy (1 class of medications)  Non-Invasive Test Results: Intermediate-risk stress test findings: cardiac mortality 1-3%/year  Prior CABG: No previous CABG      History and Physical Interval Note:  07/18/2015 9:16 AM  [place]  has presented today for surgery, with the diagnosis of cp, positive stress test  The various methods of treatment have been discussed with the patient and family. After consideration of risks, benefits and other options for treatment, the patient has consented to  Procedure(s): Left Heart Cath and Coronary Angiography (N/A) as a surgical intervention .  The patient's history has been reviewed, patient examined, no change in status, stable for surgery.  I have reviewed the patient's chart and labs.  Questions were answered to the patient's satisfaction.     Sinclair Grooms

## 2015-07-18 NOTE — Progress Notes (Signed)
The patient did not have any complaints of pain overnight and did not receive any PRN medications.  He was kept NPO and his consent was signed and placed in the chart.

## 2015-07-18 NOTE — H&P (View-Only) (Signed)
Patient ID: Mitchell Oliver, male   DOB: 1946-02-05, 69 y.o.   MRN: [id number]   Patient Name: Mitchell Oliver Date of Encounter: 07/18/2015     Active Problems:   NSTEMI (non-ST elevated myocardial infarction)   Chest pain    SUBJECTIVE  He presented to [facility] with left shoulder and neck pain while eating dinner last night. He called his daughter who brought him to [facility]. He denies chest pain or associated SOB, diaphoresis or nausea. He has not had anymore sx since that time. He had recent back surgery and got cleared to exercise in October. He denies any exertional CP or SOB. His father had MI in his 49s. Otherwise no fam hx of CAD.  CURRENT MEDS . amLODipine  10 mg Oral Daily  . aspirin EC  81 mg Oral Daily  . atorvastatin  40 mg Oral q1800  . benazepril  20 mg Oral Daily  . metoprolol succinate  50 mg Oral Daily  . pantoprazole  40 mg Oral QAC breakfast  . regadenoson  0.4 mg Intravenous Once  . sodium chloride  3 mL Intravenous Q12H  . sodium chloride  3 mL Intravenous Q12H    OBJECTIVE  Filed Vitals:   07/18/15 0110 07/18/15 0245 07/18/15 0609 07/18/15 0800  BP: 149/76  152/76 126/81  Pulse: 75  74 65  Temp: 97.9 F (36.6 C)  98 F (36.7 C) 97.8 F (36.6 C)  TempSrc: Oral  Oral Oral  Resp: 21  20   Height:      Weight:   117.391 kg (258 lb 12.8 oz)   SpO2: 94% 97% 94% 99%    Intake/Output Summary (Last 24 hours) at 07/18/15 0829 Last data filed at 07/18/15 0800  Gross per 24 hour  Intake    880 ml  Output   1800 ml  Net   -920 ml   Filed Weights   07/16/15 2158 07/17/15 0417 07/18/15 0609  Weight: 118.389 kg (261 lb) 119.115 kg (262 lb 9.6 oz) 117.391 kg (258 lb 12.8 oz)    PHYSICAL EXAM  General: Pleasant, NAD. overweight Neuro: Alert and oriented X 3. Moves all extremities spontaneously. Psych: Normal affect. HEENT:  Normal  Neck: Supple without bruits or JVD. Lungs:  Resp regular and unlabored, CTA. Heart: RRR no s3, s4, or murmurs. Abdomen: Soft,  non-tender, non-distended, BS + x 4.  Extremities: No clubbing, cyanosis or edema. DP/PT/Radials 2+ and equal bilaterally.  Accessory Clinical Findings  CBC  Recent Labs  07/16/15 2337 07/17/15 0543  WBC 12.4* 9.2  NEUTROABS 8.3*  --   HGB 14.5 13.9  HCT 40.9 39.9  MCV 87.2 86.7  PLT 262 086   Basic Metabolic Panel  Recent Labs  07/16/15 2337 07/17/15 0543  NA 134* 133*  K 4.0 4.1  CL 102 101  CO2 22 21*  GLUCOSE 135* 121*  BUN 7 6  CREATININE 1.07 0.97  CALCIUM 9.2 9.7     Cardiac Enzymes  Recent Labs  07/17/15 0543 07/17/15 1212 07/17/15 1640  TROPONINI 0.05* 0.05* 0.04*   Thyroid Function Tests  Recent Labs  07/17/15 0543  TSH 0.969    TELE  NSR with some sinus brady  Radiology/Studies  Dg Chest 2 View  07/17/2015   CLINICAL DATA:  LEFT shoulder pain radiating to LEFT scapula after eating dinner around 9 p.m. symptoms now resolved. Former smoker, hypertension, history of stroke.  EXAM: CHEST  2 VIEW  COMPARISON:  Chest radiograph May 03, 2015  FINDINGS: The cardiac silhouette is upper limits of normal in size, unchanged. Mediastinal silhouette is nonsuspicious. Mild chronic interstitial changes increased lung volumes compatible with COPD. No pleural effusion or focal consolidation. No pneumothorax. Soft tissue planes and included osseous structures are nonsuspicious. Mild degenerative change of the lower thoracic spine.  IMPRESSION: Borderline cardiomegaly and COPD; no superimposed acute cardiopulmonary process.   Electronically Signed   By: Elon Alas M.D.   On: 07/17/2015 00:38   Nm Myocar Multi W/spect W/wall Motion / Ef  07/17/2015   CLINICAL DATA:  Chest pain. Initial encounter. Myocardial ischemia.  EXAM: MYOCARDIAL IMAGING WITH SPECT (REST AND PHARMACOLOGIC-STRESS)  GATED LEFT VENTRICULAR WALL MOTION STUDY  LEFT VENTRICULAR EJECTION FRACTION  TECHNIQUE: Standard myocardial SPECT imaging was performed after resting intravenous injection of  10 mCi Tc-65m sestamibi. Subsequently, intravenous infusion of Lexiscan was performed under the supervision of the Cardiology staff. At peak effect of the drug, 30 mCi Tc-26m sestamibi was injected intravenously and standard myocardial SPECT imaging was performed. Quantitative gated imaging was also performed to evaluate left ventricular wall motion, and estimate left ventricular ejection fraction.  COMPARISON:  Chest radiograph 07/16/2015.  FINDINGS: Perfusion: There is a moderate area of decreased perfusion under stress imaging in the distal anterior wall extending to the apex. Radiotracer uptake is moderately decreased. No other areas of inducible ischemia.  Wall Motion: Mild dyskinesia corresponding to the area of inducible ischemia.  Left Ventricular Ejection Fraction: 61 %  End diastolic volume 417 ml  End systolic volume 64 ml  IMPRESSION: 1. Moderate area of anterior inferior inducible ischemia extending to the apex.  2. Mild dyskinesia in the area of inducible ischemia.  3. Left ventricular ejection fraction 61%  4. Intermediate-risk stress test findings*.  *2012 Appropriate Use Criteria for Coronary Revascularization Focused Update: J Am Coll Cardiol. 4081;44(8):185-631. http://content.airportbarriers.com.aspx?articleid=1201161   Electronically Signed   By: Dereck Ligas M.D.   On: 07/17/2015 14:17    ASSESSMENT AND PLAN  69 y/o man with pmh of HTN, HLD, hx of tobacco abuse and CVA who was transferred to [facility] from [facility] ED early this morning  for NSTEMI.  He presented with left shoulder pain and neck pain that has now completely resolved.    NSTEMI- troponin elevation mild and flat. No further pain. 0.04--> 0.06--> 0.05.  -- Continue ASA, statin and BB.  --  myovue suggests anterior ischemia  Cath today   HTN- mildly elevated. He says his BP is always perfect at home and he checks it twice a day.  -- Continue home amlodipine 10mg , toprol XL 50mg  and benazepril 20mg   HLD- LDL 63.  Continue statin   Signed, Jenkins Rouge PA-C

## 2015-07-19 LAB — BASIC METABOLIC PANEL
ANION GAP: 8 (ref 5–15)
BUN: 6 mg/dL (ref 6–20)
CHLORIDE: 102 mmol/L (ref 101–111)
CO2: 23 mmol/L (ref 22–32)
CREATININE: 1.02 mg/dL (ref 0.61–1.24)
Calcium: 9.2 mg/dL (ref 8.9–10.3)
GFR calc non Af Amer: >60 mL/min (ref >60)
Glucose, Bld: 103 mg/dL — ABNORMAL HIGH (ref 65–99)
POTASSIUM: 4 mmol/L (ref 3.5–5.1)
SODIUM: 133 mmol/L — AB (ref 135–145)

## 2015-07-19 LAB — CBC
HCT: 38 % — ABNORMAL LOW (ref 39.0–52.0)
HEMOGLOBIN: 13.3 g/dL (ref 13.0–17.0)
MCH: 30.7 pg (ref 26.0–34.0)
MCHC: 35 g/dL (ref 30.0–36.0)
MCV: 87.8 fL (ref 78.0–100.0)
PLATELETS: 245 10*3/uL (ref 150–400)
RBC: 4.33 MIL/uL (ref 4.22–5.81)
RDW: 13.2 % (ref 11.5–15.5)
WBC: 12.5 10*3/uL — AB (ref 4.0–10.5)

## 2015-07-19 MED ORDER — ATORVASTATIN CALCIUM 80 MG PO TABS
80.0000 mg | ORAL_TABLET | Freq: Every day | ORAL | Status: DC
Start: 2015-07-19 — End: 2016-03-13

## 2015-07-19 MED ORDER — ANGIOPLASTY BOOK
Freq: Once | Status: AC
  Administered 2015-07-19: 01:00:00
  Filled 2015-07-19: qty 1

## 2015-07-19 MED ORDER — TICAGRELOR 90 MG PO TABS: 90.0000 mg | ORAL_TABLET | Freq: Two times a day (BID) | ORAL | Status: DC

## 2015-07-19 MED ORDER — ACTIVE PARTNERSHIP FOR HEALTH OF YOUR HEART BOOK
Freq: Once | Status: AC
  Administered 2015-07-19: 01:00:00
  Filled 2015-07-19: qty 1

## 2015-07-19 MED ORDER — NITROGLYCERIN 0.4 MG SL SUBL: 0.4000 mg | SUBLINGUAL_TABLET | SUBLINGUAL | Status: DC | PRN

## 2015-07-19 NOTE — Progress Notes (Signed)
Patient Profile: 69 y/o male admitted for NSTEMI, s/p PCI to the PDA 9/22. Plan is for staged PCI of residual diagonal lesion on 9/26.  Subjective: No complaints. CP free.   Objective: Vital signs in last 24 hours: Temp:  [97.7 F (36.5 C)-98.5 F (36.9 C)] 98.3 F (36.8 C) (09/23 0805) Pulse Rate:  [0-86] 73 (09/23 0805) Resp:  [0-33] 16 (09/23 0805) BP: (88-173)/(48-131) 155/68 mmHg (09/23 0805) SpO2:  [0 %-100 %] 94 % (09/23 0805) Weight:  [263 lb 8 oz (119.523 kg)] 263 lb 8 oz (119.523 kg) (09/23 0431) Last BM Date: 07/17/15  Intake/Output from previous day: 09/22 0701 - 09/23 0700 In: 240 [P.O.:240] Out: 2100 [Urine:2100] Intake/Output this shift: Total I/O In: 380 [P.O.:380] Out: 800 [Urine:800]  Medications Current Facility-Administered Medications  Medication Dose Route Frequency Provider Last Rate Last Dose  . 0.9 %  sodium chloride infusion  250 mL Intravenous PRN Belva Crome, MD      . acetaminophen (TYLENOL) tablet 650 mg  650 mg Oral Q4H PRN Belva Crome, MD      . albuterol (PROVENTIL) (2.5 MG/3ML) 0.083% nebulizer solution 2.5 mg  2.5 mg Inhalation Q6H PRN Rafael Bihari, MD      . amLODipine (NORVASC) tablet 10 mg  10 mg Oral Daily Sulaiman Durenda Age, MD   10 mg at 07/18/15 1618  . aspirin chewable tablet 81 mg  81 mg Oral Daily Belva Crome, MD   0 mg at 07/18/15 1645  . atorvastatin (LIPITOR) tablet 80 mg  80 mg Oral q1800 Belva Crome, MD   80 mg at 07/18/15 2127  . benazepril (LOTENSIN) tablet 20 mg  20 mg Oral Daily Sulaiman Durenda Age, MD   20 mg at 07/18/15 2130  . metoprolol succinate (TOPROL-XL) 24 hr tablet 50 mg  50 mg Oral Daily Sulaiman Durenda Age, MD   50 mg at 07/18/15 1618  . ondansetron (ZOFRAN) injection 4 mg  4 mg Intravenous Q6H PRN Belva Crome, MD      . oxyCODONE-acetaminophen (PERCOCET/ROXICET) 5-325 MG per tablet 1-2 tablet  1-2 tablet Oral Q4H PRN Belva Crome, MD      . pantoprazole (PROTONIX) EC tablet 40  mg  40 mg Oral QAC breakfast Sulaiman Durenda Age, MD   40 mg at 07/19/15 2409  . sodium chloride 0.9 % injection 3 mL  3 mL Intravenous Q12H Sulaiman Durenda Age, MD   3 mL at 07/17/15 1151  . sodium chloride 0.9 % injection 3 mL  3 mL Intravenous Q12H Belva Crome, MD   0 mL at 07/18/15 2108  . sodium chloride 0.9 % injection 3 mL  3 mL Intravenous PRN Belva Crome, MD      . ticagrelor [facility]) tablet 90 mg  90 mg Oral BID Belva Crome, MD   90 mg at 07/18/15 2127    PE: General appearance: alert, cooperative and no distress Neck: no carotid bruit and no JVD Lungs: clear to auscultation bilaterally Heart: regular rate and rhythm, S1, S2 normal, no murmur, click, rub or gallop Extremities: no LEE Pulses: 2+ and symmetric Skin: warm and dry Neurologic: Grossly normal  Lab Results:   Recent Labs  07/16/15 2337 07/17/15 0543 07/19/15 0325  WBC 12.4* 9.2 12.5*  HGB 14.5 13.9 13.3  HCT 40.9 39.9 38.0*  PLT 262 218 245   BMET  Recent Labs  07/16/15 2337 07/17/15 0543 07/19/15 0325  NA 134* 133*  133*  K 4.0 4.1 4.0  CL 102 101 102  CO2 22 21* 23  GLUCOSE 135* 121* 103*  BUN 7 6 6   CREATININE 1.07 0.97 1.02  CALCIUM 9.2 9.7 9.2   PT/INR  Recent Labs  07/18/15 0709  LABPROT 14.5  INR 1.11   Cardiac Panel (last 3 results)  Recent Labs  07/17/15 0543 07/17/15 1212 07/17/15 1640  TROPONINI 0.05* 0.05* 0.04*    Studies/Results: LHC 07/18/15 Conclusion    1. Ost RCA to Dist RCA lesion, 40% stenosed. 2. 1st Diag lesion, 95% stenosed. 3. Prox Cx to Dist Cx lesion, 60% stenosed. 4. 1st Mrg lesion, 65% stenosed. 5. Ost RPDA to RPDA lesion, 99% stenosed. There is a 0% residual stenosis post intervention. 6. A drug-eluting stent was placed.      Assessment/Plan  Active Problems:   Essential hypertension, benign   NSTEMI (non-ST elevated myocardial infarction)   Chest pain   Elevated lipids    1. NSTEMI: LHC yesterday revealed a high-grade  obstruction in the PDA and first diagonal. S/p successful PCI with drug-eluting stent implantation in the proximal PDA. He is still with residual high-grade obstruction and a moderate to large branching diagonal. The plan is for staged intervention on Monday. LVF is normal. He is CP free. Cath site is stable as well as renal function and vital signs. Continue ASA, Brilinta, Lipitor, metoprolol and benazepril.   Dispo: Home today and will plan for staged intervention of diag on Monday.     LOS: 2 days    Brittainy M. Ladoris Gene 07/19/2015 8:13 AM  No chest pain  Post DES to PDA/  Discussed with Dr Tamala Julian D/C home 2nd case Monday for staged intervention DAT.  Can check istat Cr Monday other labs ok.  Right radial site A good pulse no hematoma.    Jenkins Rouge

## 2015-07-19 NOTE — Discharge Summary (Signed)
Physician Discharge Summary  Patient ID: Mitchell Oliver MRN: [id number] DOB/AGE: 71-May-1947 71 y.o.   Primary Cardiologist: Dr. Johnsie Cancel  Admit date: 07/16/2015 Discharge date: 07/19/2015  Admission Diagnoses: NSTEMI  Discharge Diagnoses:  Active Problems:   Essential hypertension, benign   NSTEMI (non-ST elevated myocardial infarction)   Chest pain   Elevated lipids   Discharged Condition: stable  Hospital Course: The patient is a 69 y/o male with a PMH significant for HTN and HLD but no prior h/o CAD, who presented to [facility] as a transfer from [facility] on 07/17/15 for evaluation of chest pain and mildly elevated troponins (0.06, 0.05,0.04). He underwent a Myoview nuclear stress test to risk stratify. The study was abnormal, suggesting anterior ischemia. Subsequently, he underwent a LHC on 07/18/15. This demonstrated normal LVF and a high-grade obstruction in the PDA and first diagonal. He underwent successful PCI with drug-eluting stent implantation in the proximal PDA. He is still with residual high-grade obstruction and a moderate to large branching diagonal. The plan is for staged intervention on Monday 07/22/15. He left the cath lab in stable condition. He remained stable w/o any post cath complications. His CP was controlled. He was placed on ASA + Brilinta, high dose Lipitor and metoprolol. His renal function and vital signs remained stable. He was last seen and examined by Dr. Johnsie Cancel, who determined he was stable for discharge home. He was given a free 30 day Brilinta card and a Rx for SL NTG. He is scheduled for LHC with Dr. Tamala Julian on 07/22/15.    Consults: None   Significant Labs:  Cardiac Panel (last 3 results)  Recent Labs  07/17/15 0543 07/17/15 1212 07/17/15 1640  TROPONINI 0.05* 0.05* 0.04*    Significant Diagnostic Studies:  NST 07/17/15 IMPRESSION: 1. Moderate area of anterior inferior inducible ischemia extending to the apex.  2. Mild dyskinesia in the area of  inducible ischemia.  3. Left ventricular ejection fraction 61%  4. Intermediate-risk stress test findings*.  LHC 07/18/15 Conclusion    1. Ost RCA to Dist RCA lesion, 40% stenosed. 2. 1st Diag lesion, 95% stenosed. 3. Prox Cx to Dist Cx lesion, 60% stenosed. 4. 1st Mrg lesion, 65% stenosed. 5. Ost RPDA to RPDA lesion, 99% stenosed. There is a 0% residual stenosis post intervention. 6. A drug-eluting stent was placed.     Treatments: See Hospital Course  Discharge Exam: Blood pressure 155/68, pulse 73, temperature 98.3 F (36.8 C), temperature source Oral, resp. rate 16, height 6\' 2"  (1.88 m), weight 263 lb 8 oz (119.523 kg), SpO2 94 %.   Disposition: 06-Home-Health Care Svc      Discharge Instructions    Diet - low sodium heart healthy    Complete by:  As directed      Increase activity slowly    Complete by:  As directed             Medication List    TAKE these medications        albuterol 108 (90 BASE) MCG/ACT inhaler  Commonly known as:  PROVENTIL HFA;VENTOLIN HFA  Inhale 2 puffs into the lungs every 6 (six) hours as needed for wheezing.     amLODipine 10 MG tablet  Commonly known as:  NORVASC  Take 1 tablet (10 mg total) by mouth daily.     aspirin EC 81 MG tablet  Take 81 mg by mouth daily.     atorvastatin 80 MG tablet  Commonly known as:  LIPITOR  Take 1  tablet (80 mg total) by mouth daily.     benazepril 20 MG tablet  Commonly known as:  LOTENSIN  Take 1 tablet (20 mg total) by mouth daily.     fish oil-omega-3 fatty acids 1000 MG capsule  Take 2 g by mouth daily.     fluticasone 50 MCG/ACT nasal spray  Commonly known as:  FLONASE  Place 1 spray into both nostrils daily as needed for allergies or rhinitis.     metoprolol succinate 50 MG 24 hr tablet  Commonly known as:  TOPROL-XL  Take 1 tablet (50 mg total) by mouth daily.     nitroGLYCERIN 0.4 MG SL tablet  Commonly known as:  NITROSTAT  Place 1 tablet (0.4 mg total) under the  tongue every 5 (five) minutes as needed for chest pain. Do not use within 36 hrs of Cialis use     pantoprazole 40 MG tablet  Commonly known as:  PROTONIX  Take 1 tablet (40 mg total) by mouth daily before breakfast.     tadalafil 20 MG tablet  Commonly known as:  CIALIS  Take 1 tablet 2 hours prior to sex     ticagrelor 90 MG Tabs tablet  Commonly known as:  BRILINTA  Take 1 tablet (90 mg total) by mouth 2 (two) times daily.     ticagrelor 90 MG Tabs tablet  Commonly known as:  BRILINTA  Take 1 tablet (90 mg total) by mouth 2 (two) times daily.     VITAMIN C PO  Take 1 tablet by mouth daily.     VITAMIN D PO  Take 1,000 Units by mouth daily.       Follow-up Information    Follow up with [facility] ([place] On 07/22/2015.   Why:  Please arrive at Short Stay at 7:00 AM for your heart catheterization with Dr. Micah Flesher information:   [place] [id number] [phone]    TIME SPENT ON DISCHARGE, INCLUDING PHYSICIAN TIME: > 30 MINUTES  Signed: Lyda Jester 07/19/2015, 8:52 AM

## 2015-07-19 NOTE — Progress Notes (Signed)
CM spoke with pt regarding Brilinta, booklet  @ bedside with 30 day free  card enclosed. Pt verbally stated understanding of card usage. [place] [facility] called @ [phone] per CM and confirmed medication is in stock. Pt made aware of confirmation.Benefit check in process, will f/u with pt when results are available. No other needs identified @ present time. Whitman Hero RN,BSN,CM [phone]

## 2015-07-19 NOTE — Progress Notes (Signed)
CARDIAC REHAB PHASE I   PRE:  Rate/Rhythm: 70 SR    BP: sitting 155/68    SaO2:   MODE:  Ambulation: 800 ft   POST:  Rate/Rhythm: 91 SR    BP: sitting 171/74     SaO2:   Tolerated well, no c/o. Ed completed with good reception. Understands importance of Brilinta and is interested in [facility] in [place], will send referral. [id number]   Darrick Meigs CES, ACSM 07/19/2015 9:08 AM

## 2015-07-22 ENCOUNTER — Ambulatory Visit (HOSPITAL_COMMUNITY)
Admission: AD | Admit: 2015-07-22 | Discharge: 2015-07-23 | Disposition: A | Discharge status: Home / Self Care | Payer: 59 | Source: Ambulatory Visit | Attending: Interventional Cardiology | Admitting: Interventional Cardiology

## 2015-07-22 ENCOUNTER — Encounter (HOSPITAL_COMMUNITY): Payer: Self-pay | Admitting: Interventional Cardiology

## 2015-07-22 ENCOUNTER — Encounter (HOSPITAL_COMMUNITY)
Admission: AD | Disposition: A | Discharge status: Home / Self Care | Payer: Self-pay | Source: Ambulatory Visit | Attending: Interventional Cardiology

## 2015-07-22 DIAGNOSIS — J439 Emphysema, unspecified: Secondary | ICD-10-CM | POA: Diagnosis not present

## 2015-07-22 DIAGNOSIS — I25110 Atherosclerotic heart disease of native coronary artery with unstable angina pectoris: Secondary | ICD-10-CM | POA: Insufficient documentation

## 2015-07-22 DIAGNOSIS — E785 Hyperlipidemia, unspecified: Secondary | ICD-10-CM | POA: Diagnosis not present

## 2015-07-22 DIAGNOSIS — Z955 Presence of coronary angioplasty implant and graft: Secondary | ICD-10-CM | POA: Insufficient documentation

## 2015-07-22 DIAGNOSIS — I200 Unstable angina: Secondary | ICD-10-CM | POA: Diagnosis present

## 2015-07-22 DIAGNOSIS — I214 Non-ST elevation (NSTEMI) myocardial infarction: Secondary | ICD-10-CM | POA: Diagnosis not present

## 2015-07-22 DIAGNOSIS — I10 Essential (primary) hypertension: Secondary | ICD-10-CM | POA: Diagnosis present

## 2015-07-22 DIAGNOSIS — I2584 Coronary atherosclerosis due to calcified coronary lesion: Secondary | ICD-10-CM | POA: Insufficient documentation

## 2015-07-22 DIAGNOSIS — Z9861 Coronary angioplasty status: Secondary | ICD-10-CM

## 2015-07-22 DIAGNOSIS — J449 Chronic obstructive pulmonary disease, unspecified: Secondary | ICD-10-CM | POA: Diagnosis present

## 2015-07-22 DIAGNOSIS — I2510 Atherosclerotic heart disease of native coronary artery without angina pectoris: Secondary | ICD-10-CM

## 2015-07-22 HISTORY — DX: Family history of other specified conditions: Z84.89

## 2015-07-22 HISTORY — DX: Chronic obstructive pulmonary disease, unspecified: J44.9

## 2015-07-22 HISTORY — DX: Pneumonia, unspecified organism: J18.9

## 2015-07-22 HISTORY — DX: Non-ST elevation (NSTEMI) myocardial infarction: I21.4

## 2015-07-22 HISTORY — DX: Gastro-esophageal reflux disease without esophagitis: K21.9

## 2015-07-22 HISTORY — PX: CARDIAC CATHETERIZATION: SHX172

## 2015-07-22 HISTORY — DX: Pure hypercholesterolemia, unspecified: E78.00

## 2015-07-22 LAB — POCT ACTIVATED CLOTTING TIME
ACTIVATED CLOTTING TIME: 386 s
Activated Clotting Time: 282 seconds
Activated Clotting Time: 380 seconds
Activated Clotting Time: 245 seconds
Activated Clotting Time: 257 seconds

## 2015-07-22 LAB — POCT I-STAT, CHEM 8
BUN: 9 mg/dL (ref 6–20)
Calcium, Ion: 1.24 mmol/L (ref 1.13–1.30)
Chloride: 104 mmol/L (ref 101–111)
Creatinine, Ser: 1 mg/dL (ref 0.61–1.24)
Glucose, Bld: 122 mg/dL — ABNORMAL HIGH (ref 65–99)
HEMATOCRIT: 39 % (ref 39.0–52.0)
Hemoglobin: 13.3 g/dL (ref 13.0–17.0)
Potassium: 4.1 mmol/L (ref 3.5–5.1)
SODIUM: 134 mmol/L — AB (ref 135–145)
TCO2: 21 mmol/L (ref 0–100)

## 2015-07-22 SURGERY — CORONARY STENT INTERVENTION

## 2015-07-22 MED ORDER — ALBUTEROL SULFATE (2.5 MG/3ML) 0.083% IN NEBU: 3.0000 mL | INHALATION_SOLUTION | Freq: Four times a day (QID) | RESPIRATORY_TRACT | Status: DC | PRN

## 2015-07-22 MED ORDER — IOHEXOL 350 MG/ML SOLN
INTRAVENOUS | Status: DC | PRN
  Administered 2015-07-22: 400 mL via INTRA_ARTERIAL

## 2015-07-22 MED ORDER — NITROGLYCERIN 1 MG/10 ML FOR IR/CATH LAB
INTRA_ARTERIAL | Status: DC | PRN
  Administered 2015-07-22: 2 mL

## 2015-07-22 MED ORDER — ONDANSETRON HCL 4 MG/2ML IJ SOLN: 4.0000 mg | Freq: Four times a day (QID) | INTRAMUSCULAR | Status: DC | PRN

## 2015-07-22 MED ORDER — SODIUM CHLORIDE 0.9 % IJ SOLN: 3.0000 mL | INTRAMUSCULAR | Status: DC | PRN

## 2015-07-22 MED ORDER — HEPARIN SODIUM (PORCINE) 1000 UNIT/ML IJ SOLN
INTRAMUSCULAR | Status: AC
  Filled 2015-07-22: qty 1

## 2015-07-22 MED ORDER — NITROGLYCERIN 0.4 MG SL SUBL: 0.4000 mg | SUBLINGUAL_TABLET | SUBLINGUAL | Status: DC | PRN

## 2015-07-22 MED ORDER — VERAPAMIL HCL 2.5 MG/ML IV SOLN
INTRA_ARTERIAL | Status: DC | PRN
  Administered 2015-07-22: 50 mL via INTRACORONARY

## 2015-07-22 MED ORDER — HEPARIN (PORCINE) IN NACL 2-0.9 UNIT/ML-% IJ SOLN
INTRAMUSCULAR | Status: AC
  Filled 2015-07-22: qty 1000

## 2015-07-22 MED ORDER — FENTANYL CITRATE (PF) 100 MCG/2ML IJ SOLN
INTRAMUSCULAR | Status: AC
  Filled 2015-07-22: qty 4

## 2015-07-22 MED ORDER — SODIUM CHLORIDE 0.9 % IJ SOLN: 3.0000 mL | Freq: Two times a day (BID) | INTRAMUSCULAR | Status: DC

## 2015-07-22 MED ORDER — MIDAZOLAM HCL 2 MG/2ML IJ SOLN
INTRAMUSCULAR | Status: AC
  Filled 2015-07-22: qty 4

## 2015-07-22 MED ORDER — FENTANYL CITRATE (PF) 100 MCG/2ML IJ SOLN
INTRAMUSCULAR | Status: DC | PRN
  Administered 2015-07-22: 50 ug via INTRAVENOUS
  Administered 2015-07-22: 25 ug via INTRAVENOUS
  Administered 2015-07-22 (×2): 75 ug via INTRAVENOUS

## 2015-07-22 MED ORDER — SODIUM CHLORIDE 0.9 % WEIGHT BASED INFUSION
1.0000 mL/kg/h | INTRAVENOUS | Status: DC
  Administered 2015-07-22: 1 mL/kg/h via INTRAVENOUS

## 2015-07-22 MED ORDER — SODIUM CHLORIDE 0.9 % WEIGHT BASED INFUSION
3.0000 mL/kg/h | INTRAVENOUS | Status: AC
  Administered 2015-07-22: 3 mL/kg/h via INTRAVENOUS

## 2015-07-22 MED ORDER — ASPIRIN 81 MG PO CHEW: 81.0000 mg | CHEWABLE_TABLET | Freq: Every day | ORAL | Status: DC

## 2015-07-22 MED ORDER — AMLODIPINE BESYLATE 5 MG PO TABS: 10.0000 mg | ORAL_TABLET | Freq: Every day | ORAL | Status: DC

## 2015-07-22 MED ORDER — METOPROLOL SUCCINATE ER 50 MG PO TB24: 50.0000 mg | ORAL_TABLET | Freq: Every day | ORAL | Status: DC

## 2015-07-22 MED ORDER — ASPIRIN 81 MG PO CHEW
81.0000 mg | CHEWABLE_TABLET | ORAL | Status: DC
Start: 2015-07-22 — End: 2015-07-22

## 2015-07-22 MED ORDER — PANTOPRAZOLE SODIUM 40 MG PO TBEC
40.0000 mg | DELAYED_RELEASE_TABLET | Freq: Every day | ORAL | Status: DC
  Administered 2015-07-23: 40 mg via ORAL
  Filled 2015-07-22: qty 1

## 2015-07-22 MED ORDER — LIDOCAINE HCL (PF) 1 % IJ SOLN
INTRAMUSCULAR | Status: DC | PRN
  Administered 2015-07-22: 5 mL

## 2015-07-22 MED ORDER — FLUTICASONE PROPIONATE 50 MCG/ACT NA SUSP
1.0000 | Freq: Every day | NASAL | Status: DC | PRN
  Filled 2015-07-22: qty 16

## 2015-07-22 MED ORDER — LIDOCAINE HCL (PF) 1 % IJ SOLN
INTRAMUSCULAR | Status: AC
  Filled 2015-07-22: qty 30

## 2015-07-22 MED ORDER — SODIUM CHLORIDE 0.9 % WEIGHT BASED INFUSION
3.0000 mL/kg/h | INTRAVENOUS | Status: DC
  Administered 2015-07-22: 3 mL/kg/h via INTRAVENOUS

## 2015-07-22 MED ORDER — BENAZEPRIL HCL 20 MG PO TABS
20.0000 mg | ORAL_TABLET | Freq: Every day | ORAL | Status: DC
  Administered 2015-07-22: 16:00:00 20 mg via ORAL
  Filled 2015-07-22: qty 1

## 2015-07-22 MED ORDER — VERAPAMIL HCL 2.5 MG/ML IV SOLN
INTRAVENOUS | Status: AC
  Filled 2015-07-22: qty 2

## 2015-07-22 MED ORDER — ANGIOPLASTY BOOK
Freq: Once | Status: DC
  Filled 2015-07-22: qty 1

## 2015-07-22 MED ORDER — MIDAZOLAM HCL 2 MG/2ML IJ SOLN
INTRAMUSCULAR | Status: DC | PRN
  Administered 2015-07-22 (×3): 1 mg via INTRAVENOUS

## 2015-07-22 MED ORDER — TICAGRELOR 90 MG PO TABS
90.0000 mg | ORAL_TABLET | Freq: Two times a day (BID) | ORAL | Status: DC
  Administered 2015-07-22: 21:00:00 90 mg via ORAL
  Filled 2015-07-22: qty 1

## 2015-07-22 MED ORDER — ATORVASTATIN CALCIUM 80 MG PO TABS: 80.0000 mg | ORAL_TABLET | Freq: Every day | ORAL | Status: DC

## 2015-07-22 MED ORDER — ACETAMINOPHEN 325 MG PO TABS: 650.0000 mg | ORAL_TABLET | ORAL | Status: DC | PRN

## 2015-07-22 MED ORDER — VERAPAMIL HCL 2.5 MG/ML IV SOLN
INTRAVENOUS | Status: DC | PRN
  Administered 2015-07-22: 10 mL via INTRA_ARTERIAL

## 2015-07-22 MED ORDER — NITROGLYCERIN 1 MG/10 ML FOR IR/CATH LAB
INTRA_ARTERIAL | Status: AC
Start: 2015-07-22 — End: 2015-07-22
  Filled 2015-07-22: qty 10

## 2015-07-22 MED ORDER — ASPIRIN EC 81 MG PO TBEC: 81.0000 mg | DELAYED_RELEASE_TABLET | Freq: Every day | ORAL | Status: DC

## 2015-07-22 MED ORDER — SODIUM CHLORIDE 0.9 % IV SOLN: 250.0000 mL | INTRAVENOUS | Status: DC | PRN

## 2015-07-22 MED ORDER — HEPARIN SODIUM (PORCINE) 1000 UNIT/ML IJ SOLN
INTRAMUSCULAR | Status: DC | PRN
  Administered 2015-07-22: 9000 [IU] via INTRAVENOUS
  Administered 2015-07-22 (×2): 1500 [IU] via INTRAVENOUS
  Administered 2015-07-22: 3000 [IU] via INTRAVENOUS
  Administered 2015-07-22: 2500 [IU] via INTRAVENOUS

## 2015-07-22 MED ORDER — OXYCODONE-ACETAMINOPHEN 5-325 MG PO TABS: 1.0000 | ORAL_TABLET | ORAL | Status: DC | PRN

## 2015-07-22 SURGICAL SUPPLY — 25 items
BALLN ANGIOSCULPT RX 2.5X6 (BALLOONS) ×2
BALLN ANGIOSCULPT RX 3.0X6 (BALLOONS) ×2
BALLN EMERGE MR 2.0X8 (BALLOONS) ×2
BALLN ~~LOC~~ EMERGE MR 3.0X8 (BALLOONS) ×2
BALLN ~~LOC~~ TREK RX 2.75X8 (BALLOONS) ×4
BALLOON ANGIOSCULPT RX 2.5X6 (BALLOONS) ×1 IMPLANT
BALLOON ANGIOSCULPT RX 3.0X6 (BALLOONS) ×1 IMPLANT
BALLOON EMERGE MR 2.0X8 (BALLOONS) ×1 IMPLANT
BALLOON ~~LOC~~ EMERGE MR 3.0X8 (BALLOONS) ×1 IMPLANT
BALLOON ~~LOC~~ TREK RX 2.75X8 (BALLOONS) ×2 IMPLANT
CATH ROTALINK PLUS 1.50MM (BURR) ×2 IMPLANT
CATH VISTA GUIDE 6FR XBLAD3.5 (CATHETERS) ×2 IMPLANT
DEVICE RAD COMP TR BAND LRG (VASCULAR PRODUCTS) ×2 IMPLANT
ELECT DEFIB PAD ADLT CADENCE (PAD) ×2 IMPLANT
GLIDESHEATH SLEND A-KIT 6F 22G (SHEATH) ×2 IMPLANT
KIT ENCORE 26 ADVANTAGE (KITS) ×2 IMPLANT
KIT HEART LEFT (KITS) ×4 IMPLANT
LUBRICANT ROTAGLIDE 20CC VIAL (MISCELLANEOUS) ×2 IMPLANT
PACK CARDIAC CATHETERIZATION (CUSTOM PROCEDURE TRAY) ×2 IMPLANT
STENT XIENCE ALPINE RX 2.75X12 (Permanent Stent) ×2 IMPLANT
TRANSDUCER W/STOPCOCK (MISCELLANEOUS) ×2 IMPLANT
TUBING CIL FLEX 10 FLL-RA (TUBING) ×2 IMPLANT
WIRE ASAHI PROWATER 180CM (WIRE) ×4 IMPLANT
WIRE ROTA FLOPPY .009X325CM (WIRE) ×2 IMPLANT
WIRE SAFE-T 1.5MM-J .035X260CM (WIRE) ×2 IMPLANT

## 2015-07-22 NOTE — Progress Notes (Signed)
TR BAND REMOVAL  LOCATION:  right radial  DEFLATED PER PROTOCOL:  Yes.    TIME BAND OFF / DRESSING APPLIED:   1730   SITE UPON ARRIVAL:   Level 0  SITE AFTER BAND REMOVAL:  Level 0  CIRCULATION SENSATION AND MOVEMENT:  Within Normal Limits  Yes.    COMMENTS:    

## 2015-07-22 NOTE — Interval H&P Note (Signed)
History and Physical Interval Note:  07/22/2015 9:08 AM Cath Lab Visit (complete for each Cath Lab visit)  Clinical Evaluation Leading to the Procedure:   ACS: Yes.    Non-ACS:    Anginal Classification: CCS III  Anti-ischemic medical therapy: Minimal Therapy (1 class of medications)  Non-Invasive Test Results: No non-invasive testing performed  Prior CABG: No previous CABG        [place]  has presented today for surgery, with the diagnosis of cad  The various methods of treatment have been discussed with the patient and family. After consideration of risks, benefits and other options for treatment, the patient has consented to  Procedure(s): Coronary Stent Intervention (N/A) as a surgical intervention .  The patient's history has been reviewed, patient examined, no change in status, stable for surgery.  I have reviewed the patient's chart and labs.  Questions were answered to the patient's satisfaction.     Sinclair Grooms

## 2015-07-22 NOTE — H&P (View-Only) (Signed)
Patient Profile: 69 y/o male admitted for NSTEMI, s/p PCI to the PDA 9/22. Plan is for staged PCI of residual diagonal lesion on 9/26.  Subjective: No complaints. CP free.   Objective: Vital signs in last 24 hours: Temp:  [97.7 F (36.5 C)-98.5 F (36.9 C)] 98.3 F (36.8 C) (09/23 0805) Pulse Rate:  [0-86] 73 (09/23 0805) Resp:  [0-33] 16 (09/23 0805) BP: (88-173)/(48-131) 155/68 mmHg (09/23 0805) SpO2:  [0 %-100 %] 94 % (09/23 0805) Weight:  [263 lb 8 oz (119.523 kg)] 263 lb 8 oz (119.523 kg) (09/23 0431) Last BM Date: 07/17/15  Intake/Output from previous day: 09/22 0701 - 09/23 0700 In: 240 [P.O.:240] Out: 2100 [Urine:2100] Intake/Output this shift: Total I/O In: 380 [P.O.:380] Out: 800 [Urine:800]  Medications Current Facility-Administered Medications  Medication Dose Route Frequency Provider Last Rate Last Dose  . 0.9 %  sodium chloride infusion  250 mL Intravenous PRN Belva Crome, MD      . acetaminophen (TYLENOL) tablet 650 mg  650 mg Oral Q4H PRN Belva Crome, MD      . albuterol (PROVENTIL) (2.5 MG/3ML) 0.083% nebulizer solution 2.5 mg  2.5 mg Inhalation Q6H PRN Rafael Bihari, MD      . amLODipine (NORVASC) tablet 10 mg  10 mg Oral Daily Sulaiman Durenda Age, MD   10 mg at 07/18/15 1618  . aspirin chewable tablet 81 mg  81 mg Oral Daily Belva Crome, MD   0 mg at 07/18/15 1645  . atorvastatin (LIPITOR) tablet 80 mg  80 mg Oral q1800 Belva Crome, MD   80 mg at 07/18/15 2127  . benazepril (LOTENSIN) tablet 20 mg  20 mg Oral Daily Sulaiman Durenda Age, MD   20 mg at 07/18/15 2130  . metoprolol succinate (TOPROL-XL) 24 hr tablet 50 mg  50 mg Oral Daily Sulaiman Durenda Age, MD   50 mg at 07/18/15 1618  . ondansetron (ZOFRAN) injection 4 mg  4 mg Intravenous Q6H PRN Belva Crome, MD      . oxyCODONE-acetaminophen (PERCOCET/ROXICET) 5-325 MG per tablet 1-2 tablet  1-2 tablet Oral Q4H PRN Belva Crome, MD      . pantoprazole (PROTONIX) EC tablet 40  mg  40 mg Oral QAC breakfast Sulaiman Durenda Age, MD   40 mg at 07/19/15 5284  . sodium chloride 0.9 % injection 3 mL  3 mL Intravenous Q12H Sulaiman Durenda Age, MD   3 mL at 07/17/15 1151  . sodium chloride 0.9 % injection 3 mL  3 mL Intravenous Q12H Belva Crome, MD   0 mL at 07/18/15 2108  . sodium chloride 0.9 % injection 3 mL  3 mL Intravenous PRN Belva Crome, MD      . ticagrelor [facility]) tablet 90 mg  90 mg Oral BID Belva Crome, MD   90 mg at 07/18/15 2127    PE: General appearance: alert, cooperative and no distress Neck: no carotid bruit and no JVD Lungs: clear to auscultation bilaterally Heart: regular rate and rhythm, S1, S2 normal, no murmur, click, rub or gallop Extremities: no LEE Pulses: 2+ and symmetric Skin: warm and dry Neurologic: Grossly normal  Lab Results:   Recent Labs  07/16/15 2337 07/17/15 0543 07/19/15 0325  WBC 12.4* 9.2 12.5*  HGB 14.5 13.9 13.3  HCT 40.9 39.9 38.0*  PLT 262 218 245   BMET  Recent Labs  07/16/15 2337 07/17/15 0543 07/19/15 0325  NA 134* 133*  133*  K 4.0 4.1 4.0  CL 102 101 102  CO2 22 21* 23  GLUCOSE 135* 121* 103*  BUN 7 6 6   CREATININE 1.07 0.97 1.02  CALCIUM 9.2 9.7 9.2   PT/INR  Recent Labs  07/18/15 0709  LABPROT 14.5  INR 1.11   Cardiac Panel (last 3 results)  Recent Labs  07/17/15 0543 07/17/15 1212 07/17/15 1640  TROPONINI 0.05* 0.05* 0.04*    Studies/Results: LHC 07/18/15 Conclusion    1. Ost RCA to Dist RCA lesion, 40% stenosed. 2. 1st Diag lesion, 95% stenosed. 3. Prox Cx to Dist Cx lesion, 60% stenosed. 4. 1st Mrg lesion, 65% stenosed. 5. Ost RPDA to RPDA lesion, 99% stenosed. There is a 0% residual stenosis post intervention. 6. A drug-eluting stent was placed.      Assessment/Plan  Active Problems:   Essential hypertension, benign   NSTEMI (non-ST elevated myocardial infarction)   Chest pain   Elevated lipids    1. NSTEMI: LHC yesterday revealed a high-grade  obstruction in the PDA and first diagonal. S/p successful PCI with drug-eluting stent implantation in the proximal PDA. He is still with residual high-grade obstruction and a moderate to large branching diagonal. The plan is for staged intervention on Monday. LVF is normal. He is CP free. Cath site is stable as well as renal function and vital signs. Continue ASA, Brilinta, Lipitor, metoprolol and benazepril.   Dispo: Home today and will plan for staged intervention of diag on Monday.     LOS: 2 days    Brittainy M. Ladoris Gene 07/19/2015 8:13 AM  No chest pain  Post DES to PDA/  Discussed with Dr Tamala Julian D/C home 2nd case Monday for staged intervention DAT.  Can check istat Cr Monday other labs ok.  Right radial site A good pulse no hematoma.    Jenkins Rouge

## 2015-07-22 NOTE — Care Management Note (Signed)
Case Management Note  Patient Details  Name: Mitchell Oliver MRN: [id number] Date of Birth: 21-Jan-1946  Subjective/Objective:        Pt admitted for staged PCI, recent NSTEMI            Action/Plan: PTA pt lived at home - was recently started on Brilinta on discharge 07/19/15- CM spoke with pt and gave Brilinta booklet which included 30 day free card- pt uses [facility] in [place]- Please contact CM if further assistance is needed.   Expected Discharge Date:                  Expected Discharge Plan:  Home/Self Care  In-House Referral:     Discharge planning Services  CM Consult  Post Acute Care Choice:    Choice offered to:     DME Arranged:    DME Agency:     HH Arranged:    [place] Agency:     Status of Service:  Completed, signed off  Medicare Important Message Given:    Date Medicare IM Given:    Medicare IM give by:    Date Additional Medicare IM Given:    Additional Medicare Important Message give by:     If discussed at [place] of Stay Meetings, dates discussed:    Additional Comments:  Dawayne Patricia, RN 07/22/2015, 12:11 PM

## 2015-07-23 ENCOUNTER — Encounter (HOSPITAL_COMMUNITY): Payer: Self-pay | Admitting: Cardiology

## 2015-07-23 ENCOUNTER — Telehealth: Payer: Self-pay | Admitting: Interventional Cardiology

## 2015-07-23 DIAGNOSIS — I2510 Atherosclerotic heart disease of native coronary artery without angina pectoris: Secondary | ICD-10-CM | POA: Diagnosis not present

## 2015-07-23 DIAGNOSIS — I2584 Coronary atherosclerosis due to calcified coronary lesion: Secondary | ICD-10-CM | POA: Diagnosis not present

## 2015-07-23 DIAGNOSIS — J439 Emphysema, unspecified: Secondary | ICD-10-CM | POA: Diagnosis present

## 2015-07-23 DIAGNOSIS — I214 Non-ST elevation (NSTEMI) myocardial infarction: Secondary | ICD-10-CM | POA: Diagnosis not present

## 2015-07-23 DIAGNOSIS — J449 Chronic obstructive pulmonary disease, unspecified: Secondary | ICD-10-CM | POA: Diagnosis present

## 2015-07-23 DIAGNOSIS — I10 Essential (primary) hypertension: Secondary | ICD-10-CM | POA: Diagnosis not present

## 2015-07-23 DIAGNOSIS — I25110 Atherosclerotic heart disease of native coronary artery with unstable angina pectoris: Secondary | ICD-10-CM | POA: Diagnosis not present

## 2015-07-23 LAB — CBC
HCT: 38.7 % — ABNORMAL LOW (ref 39.0–52.0)
Hemoglobin: 13.3 g/dL (ref 13.0–17.0)
MCH: 30 pg (ref 26.0–34.0)
MCHC: 34.4 g/dL (ref 30.0–36.0)
MCV: 87.4 fL (ref 78.0–100.0)
PLATELETS: 293 10*3/uL (ref 150–400)
RBC: 4.43 MIL/uL (ref 4.22–5.81)
RDW: 13.2 % (ref 11.5–15.5)
WBC: 11.7 10*3/uL — AB (ref 4.0–10.5)

## 2015-07-23 LAB — BASIC METABOLIC PANEL
Anion gap: 12 (ref 5–15)
BUN: 6 mg/dL (ref 6–20)
CALCIUM: 9 mg/dL (ref 8.9–10.3)
CO2: 19 mmol/L — ABNORMAL LOW (ref 22–32)
CREATININE: 0.96 mg/dL (ref 0.61–1.24)
Chloride: 102 mmol/L (ref 101–111)
GFR calc Af Amer: >60 mL/min (ref >60)
Glucose, Bld: 100 mg/dL — ABNORMAL HIGH (ref 65–99)
POTASSIUM: 4.1 mmol/L (ref 3.5–5.1)
SODIUM: 133 mmol/L — AB (ref 135–145)

## 2015-07-23 MED ORDER — ACETAMINOPHEN 325 MG PO TABS: 650.0000 mg | ORAL_TABLET | ORAL | Status: DC | PRN

## 2015-07-23 NOTE — Progress Notes (Signed)
Pt has been ambulating independently without problems. Feels well. Reviewed ed with pt from last week, pt able to perform teach back/recall. Will send referral to [place] Yves Dill CES, ACSM 8:03 AM 07/23/2015

## 2015-07-23 NOTE — Progress Notes (Signed)
   Doing well without any symptoms overnight.  Less than optimal diagonal PCI result due to nonexpandable lesion.  Ready for discharge today. Laboratory data is stable.  No traveling related to his job for 30 days.  OV with Dr. Tamala Julian in 7-10 days.

## 2015-07-23 NOTE — Telephone Encounter (Signed)
Pt c/o Shortness Of Breath: STAT if SOB developed within the last 24 hours or pt is noticeably SOB on the phone  1. Are you currently SOB (can you hear that pt is SOB on the phone)? No  2. How long have you been experiencing SOB? 16mins ago  3. Are you SOB when sitting or when up moving around? sitting  4. Are you currently experiencing any other symptoms? No

## 2015-07-23 NOTE — Discharge Instructions (Signed)
Coronary Angiogram with Stent °Coronary angiography with stent placement is a procedure to widen or open a narrow blood vessel of the heart (coronary artery). When a coronary artery becomes partially blocked, it decreases blood flow to that area. This may lead to chest pain or a heart attack (myocardial infarction). Arteries may become blocked by cholesterol buildup (plaque) in the lining or wall.  °A stent is a small piece of metal that looks like a mesh or a spring. Stent placement may be done right after a coronary angiography in which a blocked artery is found or as a treatment for a heart attack.  °LET YOUR HEALTH CARE PROVIDER KNOW ABOUT: °· Any allergies you have.   °· All medicines you are taking, including vitamins, herbs, eye drops, creams, and over-the-counter medicines.   °· Previous problems you or members of your family have had with the use of anesthetics.   °· Any blood disorders you have.   °· Previous surgeries you have had.   °· Medical conditions you have. °RISKS AND COMPLICATIONS °Generally, coronary angiography with stent is a safe procedure. However, problems can occur and include: °· Damage to the heart or its blood vessels.   °· A return of blockage.   °· Bleeding, infection, or bruising at the insertion site.   °· A collection of blood under the skin (hematoma) at the insertion site. °· Blood clot in another part of the body.   °· Kidney injury.   °· Allergic reaction to the dye or contrast used.   °· Bleeding into the abdomen (retroperitoneal bleeding). °BEFORE THE PROCEDURE °· Do not eat or drink anything after midnight on the night before the procedure or as directed by your health care provider.  °· Ask your health care provider about changing or stopping your regular medicines. This is especially important if you are taking diabetes medicines or blood thinners. °· Your health care provider will make sure you understand the procedure as well as the risks and potential problems  associated with the procedure.   °PROCEDURE °· You may be given a medicine to help you relax before and during the procedure (sedative). This medicine will be given through an IV tube that is put into one of your veins.   °· The area where the catheter will be inserted will be shaved and cleaned. This is usually done in the groin but may be done in the fold of your arm (near your elbow) or in the wrist.    °· A medicine will be given to numb the area where the catheter will be inserted (local anesthetic).   °· The catheter will be inserted into an artery using a guide wire. A type of X-ray (fluoroscopy) will be used to help guide the catheter to the opening of the blocked artery.   °· A dye will then be injected into the catheter, and X-rays will be taken. The dye will help to show where any narrowing or blockages are located in the heart arteries.   °· A tiny wire will be guided to the blocked spot, and a balloon will be inflated to make the artery wider. The stent will be expanded and will crush the plaque into the wall of the vessel. The stent will hold the area open like a scaffolding and improve the blood flow.   °· Sometimes the artery may be made wider using a laser or other tools to remove plaque.   °· When the blood flow is better, the catheter will be removed. The lining of the artery will grow over the stent, which stays where it was placed.   °  AFTER THE PROCEDURE °· If the procedure is done through the leg, you will be kept in bed lying flat for about 6 hours. You will be instructed to not bend or cross your legs.   °· The insertion site will be checked frequently.   °· The pulse in your feet or wrist will be checked frequently.   °· Additional blood tests, X-rays, and electrocardiography may be done. °Document Released: 04/18/2003 Document Revised: 02/26/2014 Document Reviewed: 04/20/2013 °ExitCare® Patient Information ©2015 [employer]. This information is not intended to replace advice given to you  by your health care provider. Make sure you discuss any questions you have with your health care provider. ° °

## 2015-07-23 NOTE — Discharge Summary (Signed)
Patient ID: Mitchell Oliver,  MRN: [id number], DOB/AGE: 04/11/1946 69 y.o.  Admit date: 07/22/2015 Discharge date: 07/23/2015  Primary Care Provider: Mickie Hillier, MD Primary Cardiologist: Dr Tamala Julian  Discharge Diagnoses Principal Problem:   CAD S/P percutaneous coronary angioplasty Active Problems:   NSTEMI (non-ST elevated myocardial infarction)   Essential hypertension, benign   Dyslipidemia   COPD with emphysema    Procedures: Dx PCI with DES 07/22/15   Hospital Course:  69 y/o male from [place], drive a truck for a living, with a PMH significant for HTN and HLD but no prior h/o CAD, who presented to [facility] as a transfer from [place] on 07/17/15 for evaluation of chest pain and mildly elevated troponins (0.06, 0.05,0.04). He underwent a Myoview nuclear stress test to risk stratify. The study was abnormal, suggesting anterior ischemia. Subsequently, he underwent a LHC on 07/18/15. This demonstrated normal LVF and a high-grade obstruction in the PDA and first diagonal. He underwent successful PCI with drug-eluting stent implantation in the proximal PDA. He still had residual high-grade obstruction and a moderate to large branching diagonal. The plan is for staged intervention and he was admitted 07/22/15 for this.        The pt tolerated the procedure well. Dr Tamala Julian notes a residual 50% narrowing in the Dx. The pt also had 60% CFX, 40% RCA, and 65% OM1 residual CAD. Dr Tamala Julian saw him the morning of 07/23/15 and felt he was stable for discharge. He drives a truck long distance and Dr Tamala Julian has suggested he stay local for the next month or so. He will be seen in the office in two weeks by an APP. The pt lives in [place] but has requested f/u with Dr Tamala Julian and this will be arranged for 3 months.   Discharge Vitals:  Blood pressure 143/47, pulse 64, temperature 97.7 F (36.5 C), temperature source Oral, resp. rate 18, height 6\' 2"  (1.88 m), weight 256 lb 13.4 oz (116.5 kg), SpO2 94 %.     Labs: Results for orders placed or performed during the hospital encounter of 07/22/15 (from the past 24 hour(s))  I-STAT, chem 8     Status: Abnormal   Collection Time: 07/22/15  9:31 AM  Result Value Ref Range   Sodium 134 (L) 135 - 145 mmol/L   Potassium 4.1 3.5 - 5.1 mmol/L   Chloride 104 101 - 111 mmol/L   BUN 9 6 - 20 mg/dL   Creatinine, Ser 1.00 0.61 - 1.24 mg/dL   Glucose, Bld 122 (H) 65 - 99 mg/dL   Calcium, Ion 1.24 1.13 - 1.30 mmol/L   TCO2 21 0 - 100 mmol/L   Hemoglobin 13.3 13.0 - 17.0 g/dL   HCT 39.0 39.0 - 52.0 %  POCT Activated clotting time     Status: None   Collection Time: 07/22/15  9:38 AM  Result Value Ref Range   Activated Clotting Time 282 seconds  POCT Activated clotting time     Status: None   Collection Time: 07/22/15 10:09 AM  Result Value Ref Range   Activated Clotting Time 380 seconds  POCT Activated clotting time     Status: None   Collection Time: 07/22/15 10:43 AM  Result Value Ref Range   Activated Clotting Time 245 seconds  POCT Activated clotting time     Status: None   Collection Time: 07/22/15 10:57 AM  Result Value Ref Range   Activated Clotting Time 257 seconds  POCT Activated clotting time  Status: None   Collection Time: 07/22/15 11:14 AM  Result Value Ref Range   Activated Clotting Time 386 seconds  Basic metabolic panel     Status: Abnormal   Collection Time: 07/23/15  3:40 AM  Result Value Ref Range   Sodium 133 (L) 135 - 145 mmol/L   Potassium 4.1 3.5 - 5.1 mmol/L   Chloride 102 101 - 111 mmol/L   CO2 19 (L) 22 - 32 mmol/L   Glucose, Bld 100 (H) 65 - 99 mg/dL   BUN 6 6 - 20 mg/dL   Creatinine, Ser 0.96 0.61 - 1.24 mg/dL   Calcium 9.0 8.9 - 10.3 mg/dL   GFR calc non Af Amer >60 >60 mL/min   GFR calc Af Amer >60 >60 mL/min   Anion gap 12 5 - 15    Disposition:  Follow-up Information    Follow up with Sinclair Grooms, MD.   Specialty:  Cardiology   Why:  office will contact you   Contact information:   [address] [address] [phone]       Discharge Medications:    Medication List    TAKE these medications        acetaminophen 325 MG tablet  Commonly known as:  TYLENOL  Take 2 tablets (650 mg total) by mouth every 4 (four) hours as needed for headache or mild pain.     albuterol 108 (90 BASE) MCG/ACT inhaler  Commonly known as:  PROVENTIL HFA;VENTOLIN HFA  Inhale 2 puffs into the lungs every 6 (six) hours as needed for wheezing.     amLODipine 10 MG tablet  Commonly known as:  NORVASC  Take 1 tablet (10 mg total) by mouth daily.     aspirin EC 81 MG tablet  Take 81 mg by mouth daily.     atorvastatin 80 MG tablet  Commonly known as:  LIPITOR  Take 1 tablet (80 mg total) by mouth daily.     benazepril 20 MG tablet  Commonly known as:  LOTENSIN  Take 1 tablet (20 mg total) by mouth daily.     fish oil-omega-3 fatty acids 1000 MG capsule  Take 2 g by mouth daily.     fluticasone 50 MCG/ACT nasal spray  Commonly known as:  FLONASE  Place 1 spray into both nostrils daily as needed for allergies or rhinitis.     metoprolol succinate 50 MG 24 hr tablet  Commonly known as:  TOPROL-XL  Take 1 tablet (50 mg total) by mouth daily.     nitroGLYCERIN 0.4 MG SL tablet  Commonly known as:  NITROSTAT  Place 1 tablet (0.4 mg total) under the tongue every 5 (five) minutes as needed for chest pain. Do not use within 36 hrs of Cialis use     pantoprazole 40 MG tablet  Commonly known as:  PROTONIX  Take 1 tablet (40 mg total) by mouth daily before breakfast.     tadalafil 20 MG tablet  Commonly known as:  CIALIS  Take 1 tablet 2 hours prior to sex     ticagrelor 90 MG Tabs tablet  Commonly known as:  BRILINTA  Take 1 tablet (90 mg total) by mouth 2 (two) times daily.     VITAMIN C PO  Take 1 tablet by mouth daily.     VITAMIN D PO  Take 1,000 Units by mouth daily.         Duration of Discharge Encounter: Greater than  30 minutes  including physician time.  Angelena Form PA-C 07/23/2015 7:15 AM

## 2015-07-23 NOTE — Telephone Encounter (Signed)
Returned pt call. Pt was d/c from [facility] today after his second PCI on 9/26. Pt denies chest pain,swelling, palpitations, fever, cough, chills, bleeding at site. Pt sts that after he arrived home, he began doing some housework around the house. He sat on the couch to rest, he began to fall asleep, he experienced an episode of SOB lasting a couple of minutes, pt described at as "not being able to take a deep breath" pt denies any associated symptoms. Pt sts that he read that Brilinta could cause SOB. Adv pt it could, pt sts that he is currently asymptomatic. Pt would like to know if there is anything he could do to help, if he has a repeated episode of SOB. Adv pt to take it easy the rest of the day, I will fwd a message to Dr.Smith and call back if he has any recommendations. Pt agreeable and verbalized understanding.

## 2015-07-23 NOTE — Telephone Encounter (Signed)
It is likely that Brilinta caused this episode of shortness of breath. If this continues or is intolerable we could switch to another agent. Probably Effient if we change.

## 2015-07-25 NOTE — Telephone Encounter (Signed)
Pt aware of Dr.Smith's response. Pt sts that he is tolerating Brilinta at this time, and doing well. Pt will f/u as planned on 10/14 with [place] S.,PA

## 2015-08-01 ENCOUNTER — Encounter: Payer: Self-pay | Admitting: Cardiology

## 2015-08-07 ENCOUNTER — Encounter: Payer: Self-pay | Admitting: Cardiology

## 2015-08-09 ENCOUNTER — Ambulatory Visit (INDEPENDENT_AMBULATORY_CARE_PROVIDER_SITE_OTHER): Payer: 59 | Admitting: Cardiology

## 2015-08-09 ENCOUNTER — Encounter: Payer: Self-pay | Admitting: Cardiology

## 2015-08-09 VITALS — BP 140/82 | HR 60 | Ht 74.0 in | Wt 261.4 lb

## 2015-08-09 DIAGNOSIS — Z9861 Coronary angioplasty status: Secondary | ICD-10-CM

## 2015-08-09 DIAGNOSIS — I10 Essential (primary) hypertension: Secondary | ICD-10-CM | POA: Diagnosis not present

## 2015-08-09 DIAGNOSIS — I209 Angina pectoris, unspecified: Secondary | ICD-10-CM

## 2015-08-09 DIAGNOSIS — I2510 Atherosclerotic heart disease of native coronary artery without angina pectoris: Secondary | ICD-10-CM

## 2015-08-09 NOTE — Progress Notes (Signed)
08/09/2015 Mitchell Oliver   1946/06/21  [id number]  Primary Physician Mickie Hillier, MD Primary Cardiologist: Dr. 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 He underwent a Myoview nuclear stress test to risk stratify. The study was abnormal, suggesting anterior ischemia. Subsequently, he underwent a LHC on 07/18/15. This demonstrated normal LVF and a high-grade obstruction in the PDA and first diagonal. He underwent successful PCI with drug-eluting stent implantation in the proximal PDA. He still had residual high-grade obstruction and a moderate to large branching diagonal. He ws discharged home and instructed to follow-up for staged intervention of the diagonal on  07/22/15.  Both procedures were performed by Dr. Tamala Julian  He tolerated the procedures well, however Dr Tamala Julian noted a residual 50% narrowing in the Dx. The pt also had 60% CFX, 40% RCA, and 65% OM1 residual CAD. He was placed on DAPT with ASA + Brilinta, high dose statin therapy and metoprolol.  He presents to clinic today for post hospital f/u. He reports that he has done well since discharge. He denies any recurrent angina. He has had occasional mild intermittent dyspnea which he feels is associated with his Brilinta. He has noticed improvement with caffeine.  Otherwise no exertional dyspnea, orthopnea or PND. He also denies any syncope/near-syncope. He has had no complications with his right radial cath access site. He reports full medication compliance. He denies any abnormal bleeding with aspirin and Brilinta. He has not required any use of sublingual nitroglycerin.   Current Outpatient Prescriptions  Medication Sig Dispense Refill  . acetaminophen  (TYLENOL) 325 MG tablet Take 2 tablets (650 mg total) by mouth every 4 (four) hours as needed for headache or mild pain.    Marland Kitchen albuterol (PROVENTIL HFA;VENTOLIN HFA) 108 (90 BASE) MCG/ACT inhaler Inhale 2 puffs into the lungs every 6 (six) hours as needed for wheezing. 1 Inhaler 2  . amLODipine (NORVASC) 10 MG tablet Take 1 tablet (10 mg total) by mouth daily. 30 tablet 5  . Ascorbic Acid (VITAMIN C PO) Take 1 tablet by mouth daily.     Marland Kitchen aspirin EC 81 MG tablet Take 81 mg by mouth daily.    Marland Kitchen atorvastatin (LIPITOR) 80 MG tablet Take 1 tablet (80 mg total) by mouth daily. 30 tablet 5  . benazepril (LOTENSIN) 20 MG tablet Take 1 tablet (20 mg total) by mouth daily. 30 tablet 5  . fish oil-omega-3 fatty acids 1000 MG capsule Take 2 g by mouth daily.    . fluticasone (FLONASE) 50 MCG/ACT nasal spray Place 1 spray into both nostrils daily as needed for allergies or rhinitis.     . metoprolol succinate (TOPROL-XL) 50 MG 24 hr tablet Take 1 tablet (50 mg total) by mouth daily. 30 tablet 5  . nitroGLYCERIN (NITROSTAT) 0.4 MG SL tablet Place 0.4 mg under the tongue every 5 (five) minutes as needed for chest pain (maxium 3 tablets daily). Do not take within 36 hrs of Cialis use    . pantoprazole (PROTONIX) 40 MG tablet Take 1 tablet (40 mg total) by mouth daily before breakfast. 30 tablet 5  . tadalafil (CIALIS) 20 MG tablet Take 20 mg by mouth daily as  needed for erectile dysfunction (take one tablet  two hours prior to intercourse).    . ticagrelor (BRILINTA) 90 MG TABS tablet Take 1 tablet (90 mg total) by mouth 2 (two) times daily. 60 tablet 0  . latanoprost (XALATAN) 0.005 % ophthalmic solution      No current facility-administered medications for this visit.    No Known Allergies  Social History   Social History  . Marital Status: Divorced    Spouse Name: N/A  . Number of Children: N/A  . Years of Education: N/A   Occupational History  . Not on file.   Social History Main Topics  .  Smoking status: Former Smoker -- 2.00 packs/day for 24 years    Types: Cigarettes    Quit date: 12/24/1985  . Smokeless tobacco: Never Used  . Alcohol Use: 4.2 oz/week    7 Cans of beer per week  . Drug Use: No  . Sexual Activity: Yes   Other Topics Concern  . Not on file   Social History Narrative     Review of Systems: General: negative for chills, fever, night sweats or weight changes.  Cardiovascular: negative for chest pain, dyspnea on exertion, edema, orthopnea, palpitations, paroxysmal nocturnal dyspnea or shortness of breath Dermatological: negative for rash Respiratory: negative for cough or wheezing Urologic: negative for hematuria Abdominal: negative for nausea, vomiting, diarrhea, bright red blood per rectum, melena, or hematemesis Neurologic: negative for visual changes, syncope, or dizziness All other systems reviewed and are otherwise negative except as noted above.    Blood pressure 140/82, pulse 60, height 6\' 2"  (1.88 m), weight 261 lb 6.4 oz (118.57 kg).  General appearance: alert, cooperative, no distress and moderately obese Neck: no carotid bruit and no JVD Lungs: clear to auscultation bilaterally Heart: regular rate and rhythm, S1, S2 normal, no murmur, click, rub or gallop Extremities: no LEE Pulses: 2+ and symmetric Skin: warm and dry Neurologic: Grossly normal  EKG NSR. 61 bpm. No ischemia  ASSESSMENT AND PLAN:     1. CAD:  PCI with drug-eluting stent implantation in the proximal PDA, followed by staged intervention of the diagonal with residual 50% narrowing in the diag + 60% CFX, 40% RCA, and 65% OM1. He is stable w/o recurrent CP. Continue DAPT with ASA + Brilinta for a minimum of 1 year along with statin, BB and ACE-I.   2. HTN: BP is well controlled.   3. HLD: on Lipitor.   PLAN  F/u with Dr. Tamala Julian in 2-3 months.   Lyda Jester PA-C 08/09/2015 12:50 PM

## 2015-08-09 NOTE — Patient Instructions (Signed)
Medication Instructions:  Your physician recommends that you continue on your current medications as directed. Please refer to the Current Medication list given to you today.   Labwork: None ordered  Testing/Procedures: None ordered  Follow-Up: Your physician recommends that you schedule a follow-up appointment with DR. Tamala Julian IN 2-3 MONTHS.

## 2015-08-12 ENCOUNTER — Telehealth: Payer: Self-pay | Admitting: Interventional Cardiology

## 2015-08-12 NOTE — Telephone Encounter (Signed)
Olivia Mackie, from [facility] office, calling stating Mr. Hobin is scheduled to have a crown in about a month.  They need to know if he needs antibiotics prior to procedure and if he can have epinephrine. He had a stent placed 07/22/15. Their fax number is [phone] and phone is [phone]. Advised will forward to Dr. Tamala Julian and his nurse Lattie Haw.

## 2015-08-12 NOTE — Telephone Encounter (Signed)
No epinephrine. Not able to hold anti-platelet therapy this soon. No antibiotic needed as prophylaxis.

## 2015-08-12 NOTE — Telephone Encounter (Signed)
New message    1. What dental office are you calling from? [facility] [phone] 2. What is your office phone and fax number?   3. What type of procedure is the patient having performed?  Cleaning , upcoming treatment  4. What date is procedure scheduled? In office now   5. What is your question (ex. Antibiotics prior to procedure, holding medication-we need to know how long dentist wants pt to hold med)? Please advise on pre-med and can pt have epinephrine

## 2015-08-12 NOTE — Telephone Encounter (Signed)
Notified office of Dr. Thompson Caul recommendation.  Will also fax to [phone]. Anti platelet medication is Brilinta.

## 2015-08-14 ENCOUNTER — Telehealth: Payer: Self-pay

## 2015-08-14 NOTE — Telephone Encounter (Signed)
Per [place] pt needs a 2-3 mo f/u with Dr.Smith. Pt aware of appt scheduled on 1/10 @ 8:15am. Pt voiced appreciation for the call and verbalized understanding.

## 2015-08-27 ENCOUNTER — Telehealth: Payer: Self-pay | Admitting: Family Medicine

## 2015-08-27 NOTE — Telephone Encounter (Signed)
Patient says that he read on a sign at the pharmacy that if you are over 65 and you have had one pneumonia shot, you need a second one.  He wants to know if he should come in for a pneumonia shot?

## 2015-08-27 NOTE — Telephone Encounter (Signed)
Discussed with pt that he does need prevnar 13. Offered to schedule nurse visit while on the phone. Pt states he will call back to schedule.

## 2015-08-28 ENCOUNTER — Ambulatory Visit (INDEPENDENT_AMBULATORY_CARE_PROVIDER_SITE_OTHER): Payer: 59

## 2015-08-28 DIAGNOSIS — Z23 Encounter for immunization: Secondary | ICD-10-CM | POA: Diagnosis not present

## 2015-09-10 ENCOUNTER — Telehealth: Payer: Self-pay | Admitting: Interventional Cardiology

## 2015-09-10 NOTE — Telephone Encounter (Signed)
Returned pt call. Pt sts that he is scheduled to have a dental implant on Thurs 11/17. His dentist has given him a Rx for prophylaxis, that he began taking this past Sun. His dentist has told him that they DO NOT need to interrupt his anti-platelet therapy. FYI fwd to Dr.Smith

## 2015-09-10 NOTE — Telephone Encounter (Signed)
New message      Calling to see if we got a clearance from patients dentist for a dental implant.  It is scheduled for this thurs am.  Pt is on brilinta and aspirin

## 2015-09-10 NOTE — Telephone Encounter (Signed)
If at all possible this should be delayed. The problem is if bleeding occurs, he will have to stop antiplatelet therapy and will be at risk for stent thrombosis.  I would wait unless it is an absolute emergency.

## 2015-09-11 NOTE — Telephone Encounter (Signed)
Attempted to call pt x2. Bad connection pt could not hear me

## 2015-09-11 NOTE — Telephone Encounter (Signed)
Pt called back. Pt adv of Dr.Smith's recommendation. If at all possible this should be delayed. The problem is if bleeding occurs, he will have to stop antiplatelet therapy and will be at risk for stent thrombosis.  I would wait unless it is an absolute emergency.           Pt verbalized understanding, he will call his dentist office to cancel.  Pt sts that he is suppose to travel to [place] next month for business. Pt is concerned that he will be at a higher elevation >7,000 ft. 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 Pt is requesting Dr.Smith's recommendation on whether or not it would be ok to travel. Adv pt I will fwd the question to Dr.Smith and call back with his response. Pt verbalized understanding.

## 2015-09-11 NOTE — Telephone Encounter (Signed)
From the cardiac standpoint it would be okay to travel to altitude. He should not vigorously exercise or physically overstress until he acclimates for approximately 3 days.

## 2015-09-12 ENCOUNTER — Telehealth: Payer: Self-pay | Admitting: Interventional Cardiology

## 2015-09-12 NOTE — Telephone Encounter (Signed)
Pt aware of Dr.Smith's response below with verbal understanding 

## 2015-09-12 NOTE — Telephone Encounter (Signed)
F/u ° ° ° ° ° °Pt returning Lisa's phone call. °

## 2015-09-12 NOTE — Telephone Encounter (Signed)
Called to give pt Dr.Smith's recommendation below. [email or web address] 

## 2015-09-12 NOTE — Telephone Encounter (Signed)
See tel enc dated 11/15

## 2015-11-05 ENCOUNTER — Ambulatory Visit (INDEPENDENT_AMBULATORY_CARE_PROVIDER_SITE_OTHER): Payer: 59 | Admitting: Interventional Cardiology

## 2015-11-05 ENCOUNTER — Encounter: Payer: Self-pay | Admitting: Interventional Cardiology

## 2015-11-05 VITALS — BP 120/62 | HR 64 | Ht 74.0 in | Wt 251.0 lb

## 2015-11-05 DIAGNOSIS — I10 Essential (primary) hypertension: Secondary | ICD-10-CM

## 2015-11-05 DIAGNOSIS — Z9861 Coronary angioplasty status: Secondary | ICD-10-CM

## 2015-11-05 DIAGNOSIS — J432 Centrilobular emphysema: Secondary | ICD-10-CM | POA: Diagnosis not present

## 2015-11-05 DIAGNOSIS — I2510 Atherosclerotic heart disease of native coronary artery without angina pectoris: Secondary | ICD-10-CM | POA: Diagnosis not present

## 2015-11-05 DIAGNOSIS — E785 Hyperlipidemia, unspecified: Secondary | ICD-10-CM | POA: Diagnosis not present

## 2015-11-05 NOTE — Patient Instructions (Signed)
Medication Instructions:  Your physician recommends that you continue on your current medications as directed. Please refer to the Current Medication list given to you today.   Labwork: None ordered  Testing/Procedures: None ordered  Follow-Up: Your physician wants you to follow-up in: September 2017  You will receive a reminder letter in the mail two months in advance. If you don't receive a letter, please call our office to schedule the follow-up appointment.    Any Other Special Instructions Will Be Listed Below (If Applicable). Please stay active     If you need a refill on your cardiac medications before your next appointment, please call your pharmacy.

## 2015-11-05 NOTE — Progress Notes (Signed)
Cardiology Office Note   Date:  11/05/2015   ID:  Mitchell Oliver, Mitchell Oliver 01/27/1946, MRN [id number]  PCP:  Mickie Hillier, MD  Cardiologist:  Sinclair Grooms, MD   Chief Complaint  Patient presents with  . Coronary Artery Disease      History of Present Illness: Mitchell Oliver is a 70 y.o. male who presents for CAD with drug-eluting stent implantation in setting of non-ST elevation MI in PDA and diagonal. Also history of hypertension and hyperlipidemia.  Mitchell Oliver is doing well. He denies angina and exertional fatigue. No medication side effects. He has not noted blood in the urine or stool. No palpitations or syncope. He does have some mild dyspnea on exertion that has been present for years. He is a prior smoker. He denies orthopnea and PND.    Past Medical History  Diagnosis Date  . Hypertension   . Allergy   . IFG (impaired fasting glucose)   . ED (erectile dysfunction)   . NSTEMI (non-ST elevated myocardial infarction) ([place])   . Family history of adverse reaction to anesthesia     "think my mother had real bad headaches after anesthesia"  . Hypercholesterolemia   . Pneumonia ~ 2005  . GERD (gastroesophageal reflux disease)   . CVA (cerebral vascular accident) ([place]) 2012    denies residual on 07/22/2015  . COPD (chronic obstructive pulmonary disease) [facility])     Past Surgical History  Procedure Laterality Date  . Colonoscopy  2009  . Esophagogastroduodenoscopy N/A 05/04/2014    Procedure: ESOPHAGOGASTRODUODENOSCOPY (EGD);  Surgeon: Danie Binder, MD;  [facility];  Service: Endoscopy;  Laterality: N/A;  9:15  . Savory dilation N/A 05/04/2014    Procedure: SAVORY DILATION;  Surgeon: Danie Binder, MD;  [facility];  Service: Endoscopy;  Laterality: N/A;  . Esophageal biopsy  05/04/2014    Procedure: BIOPSY;  Surgeon: Danie Binder, MD;  [facility];  Service: Endoscopy;;  . Back surgery    . Cardiac catheterization N/A  07/18/2015    Procedure: Left Heart Cath and Coronary Angiography;  Surgeon: Belva Crome, MD;  Location: [place] [facility];  Service: Cardiovascular;  Laterality: N/A;  . Cardiac catheterization N/A 07/18/2015    Procedure: Coronary Stent Intervention;  Surgeon: Belva Crome, MD;  Location: [place] [facility];  Service: Cardiovascular;  Laterality: N/A;  . Cardiac catheterization N/A 07/22/2015    Procedure: Coronary Stent Intervention;  Surgeon: Belva Crome, MD;  Location: [place] [facility];  Service: Cardiovascular;  Laterality: N/A;  . Appendectomy  2009 [facility]  . Posterior laminectomy / decompression lumbar spine  06/03/2015     Current Outpatient Prescriptions  Medication Sig Dispense Refill  . acetaminophen (TYLENOL) 325 MG tablet Take 2 tablets (650 mg total) by mouth every 4 (four) hours as needed for headache or mild pain.    Marland Kitchen albuterol (PROVENTIL HFA;VENTOLIN HFA) 108 (90 BASE) MCG/ACT inhaler Inhale 2 puffs into the lungs every 6 (six) hours as needed for wheezing. 1 Inhaler 2  . amLODipine (NORVASC) 10 MG tablet Take 1 tablet (10 mg total) by mouth daily. 30 tablet 5  . Ascorbic Acid (VITAMIN C PO) Take 1 tablet by mouth daily.     Marland Kitchen aspirin EC 81 MG tablet Take 81 mg by mouth daily.    Marland Kitchen atorvastatin (LIPITOR) 80 MG tablet Take 1 tablet (80 mg total) by mouth daily. 30 tablet 5  .  benazepril (LOTENSIN) 20 MG tablet Take 1 tablet (20 mg total) by mouth daily. 30 tablet 5  . Cyanocobalamin (VITAMIN B-12 CR) 1000 MCG TBCR Take 1 tablet by mouth daily.    . fish oil-omega-3 fatty acids 1000 MG capsule Take 2 g by mouth daily.    . fluticasone (FLONASE) 50 MCG/ACT nasal spray Place 1 spray into both nostrils daily as needed for allergies or rhinitis.     Marland Kitchen latanoprost (XALATAN) 0.005 % ophthalmic solution Place 1 drop into both eyes at bedtime.     . metoprolol succinate (TOPROL-XL) 50 MG 24 hr tablet Take 1 tablet (50 mg total) by mouth daily. 30 tablet 5  . nitroGLYCERIN  (NITROSTAT) 0.4 MG SL tablet Place 0.4 mg under the tongue every 5 (five) minutes as needed for chest pain (maxium 3 tablets daily). Do not take within 36 hrs of Cialis use    . pantoprazole (PROTONIX) 40 MG tablet Take 1 tablet (40 mg total) by mouth daily before breakfast. 30 tablet 5  . tadalafil (CIALIS) 20 MG tablet Take 20 mg by mouth daily as needed for erectile dysfunction (take one tablet  two hours prior to intercourse).    . ticagrelor (BRILINTA) 90 MG TABS tablet Take 1 tablet (90 mg total) by mouth 2 (two) times daily. 60 tablet 0  . Zinc Acetate 25 MG CAPS Take 50 mg by mouth daily.     No current facility-administered medications for this visit.    Allergies:   Review of patient's allergies indicates no known allergies.    Social History:  The patient  reports that he quit smoking about 29 years ago. His smoking use included Cigarettes. He has a 48 pack-year smoking history. He has never used smokeless tobacco. He reports that he drinks about 4.2 oz of alcohol per week. He reports that he does not use illicit drugs.   Family History:  The patient's family history includes Heart attack in his father; Hypertension in his father and mother; Stroke in his father. There is no history of Colon cancer or Colon polyps.    ROS:  Please see the history of present illness.   Otherwise, review of systems are positive for he no cc bruising, low back discomfort, and some mild dyspnea on exertion..   All other systems are reviewed and negative.    PHYSICAL EXAM: VS:  BP 120/62 mmHg  Pulse 64  Ht 6\' 2"  (1.88 m)  Wt 251 lb (113.853 kg)  BMI 32.21 kg/m2 , BMI Body mass index is 32.21 kg/(m^2). GEN: Well nourished, well developed, in no acute distress HEENT: normal Neck: no JVD, carotid bruits, or masses Cardiac: RRR.  There is no murmur, rub, or gallop. There is no edema. Respiratory:  clear to auscultation bilaterally, normal work of breathing. GI: soft, nontender, nondistended, +  BS MS: no deformity or atrophy Skin: warm and dry, no rash Neuro:  Strength and sensation are intact Psych: euthymic mood, full affect   EKG:  EKG is not ordered today.    Recent Labs: 01/01/2015: BNP 79.7 07/03/2015: ALT 34 07/17/2015: TSH 0.969 07/23/2015: BUN 6; Creatinine, Ser 0.96; Hemoglobin 13.3; Platelets 293; Potassium 4.1; Sodium 133*    Lipid Panel    Component Value Date/Time   CHOL 132 07/03/2015 0903   CHOL 118 07/03/2014 0814   TRIG 113 07/03/2015 0903   HDL 46 07/03/2015 0903   HDL 41 07/03/2014 0814   CHOLHDL 2.9 07/03/2015 0903   CHOLHDL 2.9 07/03/2014 [id number]  VLDL 21 07/03/2014 0814   LDLCALC 63 07/03/2015 0903   LDLCALC 56 07/03/2014 0814      Wt Readings from Last 3 Encounters:  11/05/15 251 lb (113.853 kg)  08/09/15 261 lb 6.4 oz (118.57 kg)  07/23/15 256 lb 13.4 oz (116.5 kg)      Other studies Reviewed: Additional studies/ records that were reviewed today include: Cath report. The findings include incomplete expansion of the diagonal stent..    ASSESSMENT AND PLAN:  1. CAD S/P percutaneous coronary angioplasty He is doing well without angina.  2. Essential hypertension, benign His blood pressure is under excellent control.  3. Dyslipidemia This is followed by Dr.Luking, his PCP. He has been on statin therapy for years. Lipid profile in September at time of MI was excellent with LDL of 63.  4. Centrilobular emphysema (HCC) Likely source of dyspnea on exertion.    Current medicines are reviewed at length with the patient today.  The patient has the following concerns regarding medicines: Continue to have lipids followed by his primary physician, Dr. [name].  The following changes/actions have been instituted:    Increase physical activity  Maintain dual antiplatelet therapy till September  Labs/ tests ordered today include:  No orders of the defined types were placed in this encounter.     Disposition:   FU with HS in 9  months  Signed, Sinclair Grooms, MD  11/05/2015 8:34 AM    [address], [place], [place]  [address] Phone: [phone]; Fax: [phone]

## 2015-11-21 ENCOUNTER — Encounter: Payer: Self-pay | Admitting: Family Medicine

## 2015-11-21 ENCOUNTER — Ambulatory Visit (INDEPENDENT_AMBULATORY_CARE_PROVIDER_SITE_OTHER): Payer: 59 | Admitting: Family Medicine

## 2015-11-21 ENCOUNTER — Other Ambulatory Visit (HOSPITAL_COMMUNITY)
Admission: AD | Admit: 2015-11-21 | Discharge: 2015-11-21 | Disposition: A | Discharge status: Home / Self Care | Payer: 59 | Source: Ambulatory Visit | Attending: Family Medicine | Admitting: Family Medicine

## 2015-11-21 ENCOUNTER — Other Ambulatory Visit: Payer: Self-pay | Admitting: *Deleted

## 2015-11-21 ENCOUNTER — Ambulatory Visit (HOSPITAL_COMMUNITY)
Admission: RE | Admit: 2015-11-21 | Discharge: 2015-11-21 | Disposition: A | Discharge status: Home / Self Care | Payer: 59 | Source: Ambulatory Visit | Attending: Family Medicine | Admitting: Family Medicine

## 2015-11-21 VITALS — BP 122/74 | Temp 97.7°F | Ht 72.0 in | Wt 247.5 lb

## 2015-11-21 DIAGNOSIS — N3289 Other specified disorders of bladder: Secondary | ICD-10-CM | POA: Insufficient documentation

## 2015-11-21 DIAGNOSIS — R109 Unspecified abdominal pain: Secondary | ICD-10-CM | POA: Insufficient documentation

## 2015-11-21 DIAGNOSIS — Z9049 Acquired absence of other specified parts of digestive tract: Secondary | ICD-10-CM | POA: Diagnosis not present

## 2015-11-21 DIAGNOSIS — I7090 Unspecified atherosclerosis: Secondary | ICD-10-CM | POA: Diagnosis not present

## 2015-11-21 DIAGNOSIS — K4090 Unilateral inguinal hernia, without obstruction or gangrene, not specified as recurrent: Secondary | ICD-10-CM | POA: Insufficient documentation

## 2015-11-21 LAB — LIPASE, BLOOD: LIPASE: 30 U/L (ref 11–51)

## 2015-11-21 LAB — CBC WITH DIFFERENTIAL/PLATELET
BASOS PCT: 0 %
Basophils Absolute: 0 10*3/uL (ref 0.0–0.1)
EOS PCT: 1 %
Eosinophils Absolute: 0.2 10*3/uL (ref 0.0–0.7)
HCT: 41.8 % (ref 39.0–52.0)
HEMOGLOBIN: 14.7 g/dL (ref 13.0–17.0)
LYMPHS ABS: 2.5 10*3/uL (ref 0.7–4.0)
Lymphocytes Relative: 21 %
MCH: 30.1 pg (ref 26.0–34.0)
MCHC: 35.2 g/dL (ref 30.0–36.0)
MCV: 85.7 fL (ref 78.0–100.0)
MONOS PCT: 9 %
Monocytes Absolute: 1 10*3/uL (ref 0.1–1.0)
NEUTROS PCT: 69 %
Neutro Abs: 8.1 10*3/uL — ABNORMAL HIGH (ref 1.7–7.7)
PLATELETS: 304 10*3/uL (ref 150–400)
RBC: 4.88 MIL/uL (ref 4.22–5.81)
RDW: 14.1 % (ref 11.5–15.5)
WBC: 11.8 10*3/uL — ABNORMAL HIGH (ref 4.0–10.5)

## 2015-11-21 LAB — POCT URINALYSIS DIPSTICK
PH UA: 6
SPEC GRAV UA: 1.01

## 2015-11-21 LAB — HEPATIC FUNCTION PANEL
ALBUMIN: 4.6 g/dL (ref 3.5–5.0)
ALT: 39 U/L (ref 17–63)
AST: 32 U/L (ref 15–41)
Alkaline Phosphatase: 59 U/L (ref 38–126)
BILIRUBIN INDIRECT: 1.3 mg/dL — AB (ref 0.3–0.9)
Bilirubin, Direct: 0.3 mg/dL (ref 0.1–0.5)
TOTAL PROTEIN: 8.2 g/dL — AB (ref 6.5–8.1)
Total Bilirubin: 1.6 mg/dL — ABNORMAL HIGH (ref 0.3–1.2)

## 2015-11-21 LAB — BASIC METABOLIC PANEL
ANION GAP: 9 (ref 5–15)
BUN: 8 mg/dL (ref 6–20)
CHLORIDE: 101 mmol/L (ref 101–111)
CO2: 21 mmol/L — ABNORMAL LOW (ref 22–32)
Calcium: 9.7 mg/dL (ref 8.9–10.3)
Creatinine, Ser: 1.06 mg/dL (ref 0.61–1.24)
Glucose, Bld: 116 mg/dL — ABNORMAL HIGH (ref 65–99)
POTASSIUM: 4.1 mmol/L (ref 3.5–5.1)
SODIUM: 131 mmol/L — AB (ref 135–145)

## 2015-11-21 LAB — AMYLASE: AMYLASE: 71 U/L (ref 28–100)

## 2015-11-21 MED ORDER — DIATRIZOATE MEGLUMINE & SODIUM 66-10 % PO SOLN
ORAL | Status: AC
  Administered 2015-11-21: 30 mL
  Filled 2015-11-21: qty 30

## 2015-11-21 MED ORDER — ONDANSETRON 4 MG PO TBDP: 4.0000 mg | ORAL_TABLET | Freq: Four times a day (QID) | ORAL | Status: DC | PRN

## 2015-11-21 NOTE — Progress Notes (Signed)
   Subjective:    Patient ID: Mitchell Oliver, male    DOB: Mar 27, 1946, 70 y.o.   MRN: [id number]  Abdominal Pain This is a new problem. The current episode started yesterday. The onset quality is gradual. The problem occurs intermittently. The pain is located in the RLQ. The quality of the pain is dull. Associated symptoms include nausea. Nothing aggravates the pain. The pain is relieved by nothing. He has tried nothing for the symptoms.   Recent angioplasty, now stble heart per card  Low abd pain, fairly significant, pos nausea, Came on in just the lst 2 days, No appetite, no fever,no actual vomiting.  No history of diverticulitis.   Pt still had colon 2009, still has galllbladder Patient also states concerns of sinus drainage.  Review of Systems  Gastrointestinal: Positive for nausea and abdominal pain.   No dysuria    Objective:   Physical Exam  Alert vitals stable. HEENT normal. Lungs clear. Heart rare rhythm. Right mid lateral abdomen tender deep palpation. No rebound no guarding good bowel sounds moderate discomfort with palpation      Assessment & Plan:  Impression acute abdominal pain with other systemic features. Concerned about potential more serious etiology discussed plan urgent CT scan. Appropriate blood work. Addendum all these results came back encouraging. CT showed no acute changes. gallbladderwas fairly visualized, limitations of scan discussed with patient, all blood work good only slightly elevated white blood count warning signs discussed carefully if symptoms were to worsen ultrasound of gallbladder would be next step

## 2015-11-22 ENCOUNTER — Telehealth: Payer: Self-pay | Admitting: Family Medicine

## 2015-11-22 ENCOUNTER — Other Ambulatory Visit: Payer: Self-pay | Admitting: *Deleted

## 2015-11-22 MED ORDER — CHLORZOXAZONE 500 MG PO TABS: 500.0000 mg | ORAL_TABLET | Freq: Three times a day (TID) | ORAL | Status: DC | PRN

## 2015-11-22 NOTE — Telephone Encounter (Signed)
Pt states the pain in the right side that he was having yesterday is gone. Nausea he was having yesterday is also gone.  Now having a pain in lower back and right hip. Using heating pad. Pain started last night. Cannot take ibuprofen while on brilinta.

## 2015-11-22 NOTE — Telephone Encounter (Signed)
Discussed with pt. He does not want hydrocodone. Will take tylenol and the muscle relaxer. Med sent to pharm. Pt to follow up if not better.

## 2015-11-22 NOTE — Telephone Encounter (Signed)
Pt seen yesterday   Pt states that he is in more pain today, wrapping all the way around his back  Now. He wanted to take some Ibuprofen but states one of his meds says not  To take that with it (Brilinta I think?)   Pt wants to know what he should do at this point? Do you think its ok to take it Or should he try something else for the pain/discomfort?   Please advise

## 2015-11-22 NOTE — Telephone Encounter (Signed)
Pt states pain feels like a pulled muscle.

## 2015-11-22 NOTE — Telephone Encounter (Signed)
Can add chlorzoxazone numb 24 500 tid plus otc tylenol or rx hydrocodone 5/325 24 one qsixhrs prn

## 2015-12-30 ENCOUNTER — Ambulatory Visit (INDEPENDENT_AMBULATORY_CARE_PROVIDER_SITE_OTHER): Payer: 59 | Admitting: Family Medicine

## 2015-12-30 ENCOUNTER — Encounter: Payer: Self-pay | Admitting: Family Medicine

## 2015-12-30 VITALS — BP 132/82 | Ht 72.0 in | Wt 248.0 lb

## 2015-12-30 DIAGNOSIS — I2510 Atherosclerotic heart disease of native coronary artery without angina pectoris: Secondary | ICD-10-CM | POA: Diagnosis not present

## 2015-12-30 DIAGNOSIS — R7301 Impaired fasting glucose: Secondary | ICD-10-CM | POA: Diagnosis not present

## 2015-12-30 DIAGNOSIS — I10 Essential (primary) hypertension: Secondary | ICD-10-CM

## 2015-12-30 DIAGNOSIS — Z9861 Coronary angioplasty status: Secondary | ICD-10-CM | POA: Diagnosis not present

## 2015-12-30 DIAGNOSIS — E785 Hyperlipidemia, unspecified: Secondary | ICD-10-CM | POA: Diagnosis not present

## 2015-12-30 DIAGNOSIS — Z79899 Other long term (current) drug therapy: Secondary | ICD-10-CM

## 2015-12-30 DIAGNOSIS — Z139 Encounter for screening, unspecified: Secondary | ICD-10-CM

## 2015-12-30 NOTE — Progress Notes (Signed)
   Subjective:    Patient ID: Mitchell Oliver, male    DOB: 06-09-1946, 70 y.o.   MRN: [id number] Patient arrives for follow-up of multiple health concerns   Hypertension This is a chronic problem. The current episode started more than 1 year ago. Risk factors for coronary artery disease include dyslipidemia. Treatments tried: lotensin, norvasc, metoprolol.   blood pressure medication review today. Does not miss a dose. No obvious side effects.  Compliant with lipid medicine. Does not miss a dose. No obvious side effects. Prior blood work reviewed.  History of glucose intolerance. Trying to watch sugar current status uncertain  Known coronary artery disease. No pain with exertion no excess dyspnea with exertion tolerating cardiac meds well and following instructions   Exercising regulalry  Uses fit bit walking three mi per d, h r around sixty five watching calories close  Pt slowing down a bit in terms of work, considereing retirement Keeps seeing on TV he needs to be tested for  Hepatitis   Review of Systems No headache, no major weight loss or weight gain, no chest pain no back pain abdominal pain no change in bowel habits complete ROS otherwise negative     Objective:   Physical Exam Alert no apparent distress. HEENT normal. Blood pressure excellent on repeat lungs clear. Heart regular in rhythm. Ankles without edema.       Assessment & Plan:  Impression 1 hypertension good control meds reviewed to maintain same dose #2 hyperlipidemia apparent good control, prior blood work reviewed. New blood work necessary for further input discussed #3 impaired fasting glucose status and certain #4 coronary artery disease clinically stable plan appropriate blood work plus hepatitis C discussion held in that regard also all meds reviewed diet exercise discussed check in 6 months for wellness plus chronic problem visit WSL

## 2016-01-01 ENCOUNTER — Other Ambulatory Visit: Payer: Self-pay | Admitting: *Deleted

## 2016-01-01 LAB — LIPID PANEL
CHOL/HDL RATIO: 2.3 ratio (ref 0.0–5.0)
Cholesterol, Total: 117 mg/dL (ref 100–199)
HDL: 52 mg/dL (ref >39)
LDL CALC: 51 mg/dL (ref 0–99)
TRIGLYCERIDES: 71 mg/dL (ref 0–149)
VLDL CHOLESTEROL CAL: 14 mg/dL (ref 5–40)

## 2016-01-01 LAB — HEPATITIS C ANTIBODY: Hep C Virus Ab: <0.1 s/co ratio (ref 0.0–0.9)

## 2016-01-01 LAB — HEPATIC FUNCTION PANEL
ALT: 32 IU/L (ref 0–44)
AST: 24 IU/L (ref 0–40)
Albumin: 4.5 g/dL (ref 3.5–4.8)
Alkaline Phosphatase: 50 IU/L (ref 39–117)
Bilirubin Total: 0.9 mg/dL (ref 0.0–1.2)
Bilirubin, Direct: 0.29 mg/dL (ref 0.00–0.40)
Total Protein: 7.5 g/dL (ref 6.0–8.5)

## 2016-01-01 LAB — GLUCOSE, RANDOM: GLUCOSE: 112 mg/dL — AB (ref 65–99)

## 2016-01-01 MED ORDER — PANTOPRAZOLE SODIUM 40 MG PO TBEC: 40.0000 mg | DELAYED_RELEASE_TABLET | Freq: Every day | ORAL | Status: DC

## 2016-01-07 ENCOUNTER — Other Ambulatory Visit: Payer: Self-pay | Admitting: Family Medicine

## 2016-01-24 ENCOUNTER — Other Ambulatory Visit: Payer: Self-pay | Admitting: Family Medicine

## 2016-01-27 ENCOUNTER — Telehealth: Payer: Self-pay | Admitting: Interventional Cardiology

## 2016-01-27 NOTE — Telephone Encounter (Signed)
New message     Pt has a question about getting a massage with heart stents.  Please call after 12:30

## 2016-01-28 NOTE — Telephone Encounter (Signed)
Same problem:  Pt is calling back to speak with nurse about heart stents

## 2016-01-28 NOTE — Telephone Encounter (Signed)
Pt sts that prior to his MI he would have massages regularly to help with his back and leg issues. Pt sts that his massage therapist will not resume massage therapy unless he can provide written permission from his cardiologist. Adv pt that I will fwd Dr. Tamala Julian the rqst. Pt voiced appreciation for the call and verbalized understanding.  Pt rqst that Dr.Smith's response be sent via my chart.

## 2016-01-29 NOTE — Telephone Encounter (Signed)
There is no problem with the patient having massage therapy.

## 2016-01-29 NOTE — Telephone Encounter (Signed)
Follow Up:   Pt says you was suppose to send him a letter stating that it was all right for him to have a massage.He need this asap,going out of town on Friday.

## 2016-01-29 NOTE — Telephone Encounter (Signed)
spke with pt,. Adv him that his rqst has been fwd Dr.Smith and we are awaiting his response. Pt voiced appreciation for the call back and verbalized understanding

## 2016-01-30 NOTE — Telephone Encounter (Signed)
Dr.Smith's response below sent to pt via my chart as pt requested

## 2016-02-10 ENCOUNTER — Other Ambulatory Visit: Payer: Self-pay | Admitting: *Deleted

## 2016-02-10 MED ORDER — TADALAFIL 20 MG PO TABS
20.0000 mg | ORAL_TABLET | Freq: Every day | ORAL | Status: DC | PRN
Start: 2016-02-10 — End: 2017-02-10

## 2016-02-10 NOTE — Progress Notes (Signed)
Ok ref times six

## 2016-02-29 ENCOUNTER — Other Ambulatory Visit: Payer: Self-pay | Admitting: Family Medicine

## 2016-03-04 ENCOUNTER — Other Ambulatory Visit: Payer: Self-pay | Admitting: Family Medicine

## 2016-03-13 ENCOUNTER — Other Ambulatory Visit: Payer: Self-pay | Admitting: *Deleted

## 2016-03-13 ENCOUNTER — Telehealth: Payer: Self-pay | Admitting: Family Medicine

## 2016-03-13 MED ORDER — ATORVASTATIN CALCIUM 80 MG PO TABS: 80.0000 mg | ORAL_TABLET | Freq: Every day | ORAL | Status: DC

## 2016-03-13 NOTE — Telephone Encounter (Signed)
Last seen 12/30/15. Last lipid ordered by dr Richardson Landry 12/31/15

## 2016-03-13 NOTE — Telephone Encounter (Signed)
Ok to fill for 6 months per dr Nicki Reaper. Refills sent to pharm. Pt notified on voicemail.

## 2016-03-13 NOTE — Telephone Encounter (Signed)
Pt is out of his atorvastatin (LIPITOR) 80 MG tablet. Pt states that [facility] in [place] has sent over refill requests and nothing has been sent in. Upon looking in the chart it appears that his cardiologist changed the dosage on the medication. Should the pt call his cardiologist or can we refill this?

## 2016-03-30 ENCOUNTER — Ambulatory Visit (INDEPENDENT_AMBULATORY_CARE_PROVIDER_SITE_OTHER): Payer: 59 | Admitting: Family Medicine

## 2016-03-30 ENCOUNTER — Encounter: Payer: Self-pay | Admitting: Family Medicine

## 2016-03-30 VITALS — BP 128/74 | Temp 97.9°F | Ht 72.0 in | Wt 249.0 lb

## 2016-03-30 DIAGNOSIS — J0100 Acute maxillary sinusitis, unspecified: Secondary | ICD-10-CM

## 2016-03-30 MED ORDER — ALBUTEROL SULFATE HFA 108 (90 BASE) MCG/ACT IN AERS: 2.0000 | INHALATION_SPRAY | Freq: Four times a day (QID) | RESPIRATORY_TRACT | Status: DC | PRN

## 2016-03-30 MED ORDER — CEFUROXIME AXETIL 500 MG PO TABS: 500.0000 mg | ORAL_TABLET | Freq: Two times a day (BID) | ORAL | Status: DC

## 2016-03-30 NOTE — Progress Notes (Signed)
   Subjective:    Patient ID: Mitchell Oliver, male    DOB: 1946/05/07, 70 y.o.   MRN: [id number]  Sinusitis This is a new problem. Episode onset: 2 days ago. Associated symptoms include congestion, coughing, headaches and a sore throat. (Wheezing ) Treatments tried: saline spray.   Flying started couple days   Woke up with nasal cong and sore throat  Also dim enrgy  Used saline spray  Some cough   some wlight weheezy  not new    Review of Systems  HENT: Positive for congestion and sore throat.   Respiratory: Positive for cough.   Neurological: Positive for headaches.       Objective:   Physical Exam  Alert, mild malaise. Hydration good Vitals stable. frontal/ maxillary tenderness evident positive nasal congestion. pharynx normal neck supple  lungs clear/no crackles or wheezes. heart regular in rhythm       Assessment & Plan:  Impression rhinosinusitis likely post viral, discussed with patient. plan antibiotics prescribed. Questions answered. Symptomatic care discussed. warning signs discussed. WSL

## 2016-05-23 ENCOUNTER — Other Ambulatory Visit: Payer: Self-pay | Admitting: Cardiology

## 2016-07-03 ENCOUNTER — Encounter: Payer: 59 | Admitting: Family Medicine

## 2016-07-06 ENCOUNTER — Encounter: Payer: Self-pay | Admitting: Family Medicine

## 2016-07-06 ENCOUNTER — Ambulatory Visit (INDEPENDENT_AMBULATORY_CARE_PROVIDER_SITE_OTHER): Payer: 59 | Admitting: Family Medicine

## 2016-07-06 VITALS — BP 118/74 | Ht 72.0 in | Wt 254.4 lb

## 2016-07-06 DIAGNOSIS — Z0000 Encounter for general adult medical examination without abnormal findings: Secondary | ICD-10-CM | POA: Diagnosis not present

## 2016-07-06 DIAGNOSIS — E785 Hyperlipidemia, unspecified: Secondary | ICD-10-CM | POA: Diagnosis not present

## 2016-07-06 DIAGNOSIS — J431 Panlobular emphysema: Secondary | ICD-10-CM | POA: Diagnosis not present

## 2016-07-06 DIAGNOSIS — Z23 Encounter for immunization: Secondary | ICD-10-CM | POA: Diagnosis not present

## 2016-07-06 DIAGNOSIS — I10 Essential (primary) hypertension: Secondary | ICD-10-CM

## 2016-07-06 MED ORDER — AMLODIPINE BESYLATE 10 MG PO TABS: 10.0000 mg | ORAL_TABLET | Freq: Every day | ORAL | 5 refills | Status: DC

## 2016-07-06 MED ORDER — ATORVASTATIN CALCIUM 80 MG PO TABS: 80.0000 mg | ORAL_TABLET | Freq: Every day | ORAL | 5 refills | Status: DC

## 2016-07-06 MED ORDER — PANTOPRAZOLE SODIUM 40 MG PO TBEC: 40.0000 mg | DELAYED_RELEASE_TABLET | Freq: Every morning | ORAL | 5 refills | Status: DC

## 2016-07-06 MED ORDER — BENAZEPRIL HCL 20 MG PO TABS: 20.0000 mg | ORAL_TABLET | Freq: Every day | ORAL | 5 refills | Status: DC

## 2016-07-06 NOTE — Progress Notes (Signed)
Subjective:    Patient ID: Mitchell Oliver, male    DOB: 08-Feb-1946, 70 y.o.   MRN: [id number]  HPI AWV- Annual Wellness Visit  The patient was seen for their annual wellness visit. The patient's past medical history, surgical history, and family history were reviewed. Pertinent vaccines were reviewed ( tetanus, pneumonia, shingles, flu) The patient's medication list was reviewed and updated.  The height and weight were entered. The patient's current BMI is:  Cognitive screening was completed. Outcome of Mini - Cog:   Falls within the past 6 months:None   Current tobacco usage: None  (All patients who use tobacco were given written and verbal information on quitting)  Recent listing of emergency department/hospitalizations over the past year were reviewed.  current specialist the patient sees on a regular basis: Dr.Smith Systems developer)   Medicare annual wellness visit patient questionnaire was reviewed.  A written screening schedule for the patient for the next 5-10 years was given. Appropriate discussion of followup regarding next visit was discussed.  Patient states has concerns of SOB periodically. notres a bit with exertion.smoked for twenty years greater than pack a day.  Notes diet overall is so so, on the road a lot, not the bext  Last colonoscopy 5 years ago.  Blood pressure medicine and blood pressure levels reviewed today with patient. Compliant with blood pressure medicine. States does not miss a dose. No obvious side effects. Blood pressure generally good when checked elsewhere. Watching salt intake.    ip. l  Some should  systolic   Review of Systems  Constitutional: Negative for activity change, appetite change and fever.  HENT: Negative for congestion and rhinorrhea.   Eyes: Negative for discharge.  Respiratory: Negative for cough and wheezing.   Cardiovascular: Negative for chest pain.  Gastrointestinal: Negative for abdominal pain, blood in stool  and vomiting.  Genitourinary: Negative for difficulty urinating and frequency.  Musculoskeletal: Negative for neck pain.  Skin: Negative for rash.  Allergic/Immunologic: Negative for environmental allergies and food allergies.  Neurological: Negative for weakness and headaches.  Psychiatric/Behavioral: Negative for agitation.  All other systems reviewed and are negative.      Objective:   Physical Exam  Constitutional: He appears well-developed and well-nourished.  HENT:  Head: Normocephalic and atraumatic.  Right Ear: External ear normal.  Left Ear: External ear normal.  Nose: Nose normal.  Mouth/Throat: Oropharynx is clear and moist.  Eyes: EOM are normal. Pupils are equal, round, and reactive to light.  Neck: Normal range of motion. Neck supple. No thyromegaly present.  Cardiovascular: Normal rate, regular rhythm and normal heart sounds.   No murmur heard. Pulmonary/Chest: Effort normal and breath sounds normal. No respiratory distress. He has no wheezes.  Abdominal: Soft. Bowel sounds are normal. He exhibits no distension and no mass. There is no tenderness.  Genitourinary: Penis normal.  Musculoskeletal: Normal range of motion. He exhibits no edema.  Lymphadenopathy:    He has no cervical adenopathy.  Neurological: He is alert. He exhibits normal muscle tone.  Skin: Skin is warm and dry. No erythema.  Psychiatric: He has a normal mood and affect. His behavior is normal. Judgment normal.  Vitals reviewed. Prostate gland within normal limits        Assessment & Plan:Impression wellness exam. Up-to-date on colonoscopy. Up-to-date on vaccinations. #2 dyspnea likely element of COPD symptomatology, x-ray revealed in past, discussed patient wishes to hold off on further workup #3 hypertension good control to contain maintain same meds #4 hyperlipidemia recent  blood work reviewed to maintain plan medications refilled. Hemoccult cards. Flu shot.

## 2016-07-07 LAB — LIPID PANEL
CHOL/HDL RATIO: 2.1 ratio (ref 0.0–5.0)
CHOLESTEROL TOTAL: 113 mg/dL (ref 100–199)
HDL: 54 mg/dL (ref >39)
LDL Calculated: 42 mg/dL (ref 0–99)
TRIGLYCERIDES: 84 mg/dL (ref 0–149)
VLDL Cholesterol Cal: 17 mg/dL (ref 5–40)

## 2016-07-07 LAB — BASIC METABOLIC PANEL
BUN / CREAT RATIO: 10 (ref 10–24)
BUN: 10 mg/dL (ref 8–27)
CHLORIDE: 97 mmol/L (ref 96–106)
CO2: 16 mmol/L — ABNORMAL LOW (ref 18–29)
Calcium: 9.4 mg/dL (ref 8.6–10.2)
Creatinine, Ser: 1.03 mg/dL (ref 0.76–1.27)
GFR calc non Af Amer: 72 mL/min/{1.73_m2} (ref >59)
GFR, EST AFRICAN AMERICAN: 84 mL/min/{1.73_m2} (ref >59)
GLUCOSE: 106 mg/dL — AB (ref 65–99)
POTASSIUM: 4.8 mmol/L (ref 3.5–5.2)
SODIUM: 133 mmol/L — AB (ref 134–144)

## 2016-07-07 LAB — HEPATIC FUNCTION PANEL
ALT: 29 IU/L (ref 0–44)
AST: 22 IU/L (ref 0–40)
Albumin: 4.3 g/dL (ref 3.5–4.8)
Alkaline Phosphatase: 43 IU/L (ref 39–117)
BILIRUBIN, DIRECT: 0.37 mg/dL (ref 0.00–0.40)
Bilirubin Total: 1.5 mg/dL — ABNORMAL HIGH (ref 0.0–1.2)
Total Protein: 7.3 g/dL (ref 6.0–8.5)

## 2016-07-07 LAB — PSA: PROSTATE SPECIFIC AG, SERUM: 0.8 ng/mL (ref 0.0–4.0)

## 2016-07-17 ENCOUNTER — Encounter: Payer: Self-pay | Admitting: Family Medicine

## 2016-07-22 ENCOUNTER — Other Ambulatory Visit: Payer: Self-pay | Admitting: Family Medicine

## 2016-08-04 ENCOUNTER — Other Ambulatory Visit: Payer: Self-pay | Admitting: *Deleted

## 2016-08-04 DIAGNOSIS — Z0000 Encounter for general adult medical examination without abnormal findings: Secondary | ICD-10-CM

## 2016-08-04 DIAGNOSIS — M9903 Segmental and somatic dysfunction of lumbar region: Secondary | ICD-10-CM | POA: Diagnosis not present

## 2016-08-04 DIAGNOSIS — M5432 Sciatica, left side: Secondary | ICD-10-CM | POA: Diagnosis not present

## 2016-08-04 LAB — POC HEMOCCULT BLD/STL (HOME/3-CARD/SCREEN)
FECAL OCCULT BLD: NEGATIVE
FECAL OCCULT BLD: NEGATIVE
Fecal Occult Blood, POC: NEGATIVE

## 2016-08-05 DIAGNOSIS — M9903 Segmental and somatic dysfunction of lumbar region: Secondary | ICD-10-CM | POA: Diagnosis not present

## 2016-08-05 DIAGNOSIS — M5432 Sciatica, left side: Secondary | ICD-10-CM | POA: Diagnosis not present

## 2016-08-06 DIAGNOSIS — M5432 Sciatica, left side: Secondary | ICD-10-CM | POA: Diagnosis not present

## 2016-08-06 DIAGNOSIS — M9903 Segmental and somatic dysfunction of lumbar region: Secondary | ICD-10-CM | POA: Diagnosis not present

## 2016-08-10 DIAGNOSIS — M5432 Sciatica, left side: Secondary | ICD-10-CM | POA: Diagnosis not present

## 2016-08-10 DIAGNOSIS — M9903 Segmental and somatic dysfunction of lumbar region: Secondary | ICD-10-CM | POA: Diagnosis not present

## 2016-08-11 DIAGNOSIS — M5432 Sciatica, left side: Secondary | ICD-10-CM | POA: Diagnosis not present

## 2016-08-11 DIAGNOSIS — M9903 Segmental and somatic dysfunction of lumbar region: Secondary | ICD-10-CM | POA: Diagnosis not present

## 2016-08-12 DIAGNOSIS — M9903 Segmental and somatic dysfunction of lumbar region: Secondary | ICD-10-CM | POA: Diagnosis not present

## 2016-08-12 DIAGNOSIS — M5432 Sciatica, left side: Secondary | ICD-10-CM | POA: Diagnosis not present

## 2016-08-17 DIAGNOSIS — M5432 Sciatica, left side: Secondary | ICD-10-CM | POA: Diagnosis not present

## 2016-08-17 DIAGNOSIS — M9903 Segmental and somatic dysfunction of lumbar region: Secondary | ICD-10-CM | POA: Diagnosis not present

## 2016-08-18 DIAGNOSIS — M9903 Segmental and somatic dysfunction of lumbar region: Secondary | ICD-10-CM | POA: Diagnosis not present

## 2016-08-18 DIAGNOSIS — M5432 Sciatica, left side: Secondary | ICD-10-CM | POA: Diagnosis not present

## 2016-08-19 DIAGNOSIS — M9903 Segmental and somatic dysfunction of lumbar region: Secondary | ICD-10-CM | POA: Diagnosis not present

## 2016-08-19 DIAGNOSIS — M5432 Sciatica, left side: Secondary | ICD-10-CM | POA: Diagnosis not present

## 2016-08-21 ENCOUNTER — Other Ambulatory Visit: Payer: Self-pay | Admitting: Family Medicine

## 2016-08-24 DIAGNOSIS — M9903 Segmental and somatic dysfunction of lumbar region: Secondary | ICD-10-CM | POA: Diagnosis not present

## 2016-08-24 DIAGNOSIS — M5432 Sciatica, left side: Secondary | ICD-10-CM | POA: Diagnosis not present

## 2016-08-27 DIAGNOSIS — M5432 Sciatica, left side: Secondary | ICD-10-CM | POA: Diagnosis not present

## 2016-08-27 DIAGNOSIS — M9903 Segmental and somatic dysfunction of lumbar region: Secondary | ICD-10-CM | POA: Diagnosis not present

## 2016-08-31 ENCOUNTER — Encounter: Payer: Self-pay | Admitting: Nurse Practitioner

## 2016-08-31 ENCOUNTER — Ambulatory Visit (INDEPENDENT_AMBULATORY_CARE_PROVIDER_SITE_OTHER): Payer: Medicare Other | Admitting: Nurse Practitioner

## 2016-08-31 VITALS — BP 128/80 | HR 57 | Ht 74.0 in | Wt 259.0 lb

## 2016-08-31 DIAGNOSIS — I10 Essential (primary) hypertension: Secondary | ICD-10-CM | POA: Diagnosis not present

## 2016-08-31 DIAGNOSIS — E785 Hyperlipidemia, unspecified: Secondary | ICD-10-CM

## 2016-08-31 DIAGNOSIS — I2510 Atherosclerotic heart disease of native coronary artery without angina pectoris: Secondary | ICD-10-CM | POA: Diagnosis not present

## 2016-08-31 DIAGNOSIS — M9903 Segmental and somatic dysfunction of lumbar region: Secondary | ICD-10-CM | POA: Diagnosis not present

## 2016-08-31 DIAGNOSIS — Z9861 Coronary angioplasty status: Secondary | ICD-10-CM

## 2016-08-31 DIAGNOSIS — M5432 Sciatica, left side: Secondary | ICD-10-CM | POA: Diagnosis not present

## 2016-08-31 DIAGNOSIS — I259 Chronic ischemic heart disease, unspecified: Secondary | ICD-10-CM

## 2016-08-31 NOTE — Progress Notes (Addendum)
CARDIOLOGY OFFICE NOTE  Date:  08/31/2016    Mitchell Oliver Date of Birth: May 31, 1946 Medical Record [id number]  PCP:  Mickie Hillier, MD  Cardiologist:  Jennings Books    Chief Complaint  Patient presents with  . Coronary Artery Disease    10 month check - seen for Dr. Tamala Julian    History of Present Illness: Mitchell Oliver is a 70 y.o. male who presents today for a follow up visit. Seen for Dr. Tamala Julian.   He has a history of CAD with drug-eluting stent implantation in setting of non-ST elevation MI in PDA and diagonal back in 2016. Also history of emphysema, hypertension and hyperlipidemia.  Last seen in January and felt to be doing well.   Comes in today. Here alone.  Feels good. No chest pain. Does get short of breath - attributes to his Kary Kos (says he never had before he went on Brilinta) - uses caffeine with resolution. Had some back issues - led to gaining back his weight - he was down in the 230's. He is motivated to get back on track. Asking about proceeding on with his dental implant. Has held off since he was on DAPT. Overall, he is happy with how he is doing and has no concerns. Did retire at the end of September.   Past Medical History:  Diagnosis Date  . Allergy   . COPD (chronic obstructive pulmonary disease) ([place])   . CVA (cerebral vascular accident) ([place]) 2012   denies residual on 07/22/2015  . ED (erectile dysfunction)   . Family history of adverse reaction to anesthesia    "think my mother had real bad headaches after anesthesia"  . GERD (gastroesophageal reflux disease)   . Hypercholesterolemia   . Hypertension   . IFG (impaired fasting glucose)   . NSTEMI (non-ST elevated myocardial infarction) ([place])   . Pneumonia ~ 2005    Past Surgical History:  Procedure Laterality Date  . [facility]ENDECTOMY  2009 [facility]  . BACK SURGERY    . BIOPSY  05/04/2014   Procedure: BIOPSY;  Surgeon: Danie Binder, MD;  [facility];  Service: Endoscopy;;  .  CARDIAC CATHETERIZATION N/A 07/18/2015   Procedure: Left Heart Cath and Coronary Angiography;  Surgeon: Belva Crome, MD;  Location: [facility];  Service: Cardiovascular;  Laterality: N/A;  . CARDIAC CATHETERIZATION N/A 07/18/2015   Procedure: Coronary Stent Intervention;  Surgeon: Belva Crome, MD;  Location: [facility];  Service: Cardiovascular;  Laterality: N/A;  . CARDIAC CATHETERIZATION N/A 07/22/2015   Procedure: Coronary Stent Intervention;  Surgeon: Belva Crome, MD;  Location: [facility];  Service: Cardiovascular;  Laterality: N/A;  . COLONOSCOPY  2009  . ESOPHAGOGASTRODUODENOSCOPY N/A 05/04/2014   Procedure: ESOPHAGOGASTRODUODENOSCOPY (EGD);  Surgeon: Danie Binder, MD;  [facility];  Service: Endoscopy;  Laterality: N/A;  9:15  . POSTERIOR LAMINECTOMY / DECOMPRESSION LUMBAR SPINE  06/03/2015  . SAVORY DILATION N/A 05/04/2014   Procedure: SAVORY DILATION;  Surgeon: Danie Binder, MD;  [facility];  Service: Endoscopy;  Laterality: N/A;     Medications: Current Outpatient Prescriptions  Medication Sig Dispense Refill  . acetaminophen (TYLENOL) 325 MG tablet Take 2 tablets (650 mg total) by mouth every 4 (four) hours as needed for headache or mild pain.    Marland Kitchen albuterol (PROVENTIL HFA;VENTOLIN HFA) 108 (90 Base) MCG/ACT inhaler Inhale 2 puffs into the lungs every 6 (six) hours  as needed for wheezing or shortness of breath. 1 Inhaler 2  . amLODipine (NORVASC) 10 MG tablet take 1 tablet by mouth once daily 30 tablet 5  . Ascorbic Acid (VITAMIN C PO) Take 1 tablet by mouth daily.     Marland Kitchen aspirin EC 81 MG tablet Take 81 mg by mouth daily.    Marland Kitchen atorvastatin (LIPITOR) 80 MG tablet Take 1 tablet (80 mg total) by mouth daily. 30 tablet 5  . benazepril (LOTENSIN) 20 MG tablet take 1 tablet by mouth once daily 30 tablet 5  . BRILINTA 90 MG TABS tablet take 1 tablet by mouth twice a day 60 tablet 6  . cefUROXime (CEFTIN) 500 MG tablet Take 1 tablet  (500 mg total) by mouth 2 (two) times daily with a meal. 20 tablet 0  . chlorzoxazone (PARAFON FORTE DSC) 500 MG tablet Take 1 tablet (500 mg total) by mouth 3 (three) times daily as needed for muscle spasms. 24 tablet 0  . Cyanocobalamin (VITAMIN B-12 CR) 1000 MCG TBCR Take 1 tablet by mouth daily.    . fish oil-omega-3 fatty acids 1000 MG capsule Take 2 g by mouth daily.    . fluticasone (FLONASE) 50 MCG/ACT nasal spray Place 1 spray into both nostrils daily as needed for allergies or rhinitis.     Marland Kitchen latanoprost (XALATAN) 0.005 % ophthalmic solution Place 1 drop into both eyes at bedtime.     . metoprolol succinate (TOPROL-XL) 50 MG 24 hr tablet take 1 tablet by mouth once daily 30 tablet 5  . Multiple Vitamins-Minerals (PRESERVISION AREDS PO) Take by mouth 2 (two) times daily.    . nitroGLYCERIN (NITROSTAT) 0.4 MG SL tablet Place 0.4 mg under the tongue every 5 (five) minutes as needed for chest pain (maxium 3 tablets daily). Do not take within 36 hrs of Cialis use    . pantoprazole (PROTONIX) 40 MG tablet take 1 tablet by mouth every morning 30 tablet 5  . tadalafil (CIALIS) 20 MG tablet Take 1 tablet (20 mg total) by mouth daily as needed for erectile dysfunction (take one tablet  two hours prior to intercourse). 10 tablet 5  . Zinc Acetate 25 MG CAPS Take 50 mg by mouth daily.     No current facility-administered medications for this visit.     Allergies: No Known Allergies  Social History: The patient  reports that he quit smoking about 30 years ago. His smoking use included Cigarettes. He has a 48.00 pack-year smoking history. He has never used smokeless tobacco. He reports that he drinks about 4.2 oz of alcohol per week . He reports that he does not use drugs.   Family History: The patient's family history includes Heart attack in his father; Hypertension in his father and mother; Stroke in his father.   Review of Systems: Please see the history of present illness.   Otherwise, the  review of systems is positive for none.   All other systems are reviewed and negative.   Physical Exam: VS:  BP 128/80   Pulse (!) 57   Ht 6\' 2"  (1.88 m)   Wt 259 lb (117.5 kg)   BMI 33.25 kg/m  .  BMI Body mass index is 33.25 kg/m.  Wt Readings from Last 3 Encounters:  08/31/16 259 lb (117.5 kg)  07/06/16 254 lb 6 oz (115.4 kg)  03/30/16 249 lb (112.9 kg)    General: Pleasant. Well developed, well nourished and in no acute distress.  He has gained weight  back.  HEENT: Normal.  Neck: Supple, no JVD, carotid bruits, or masses noted.  Cardiac: Regular rate and rhythm. No murmurs, rubs, or gallops. No edema.  Respiratory:  Lungs are clear to auscultation bilaterally with normal work of breathing.  GI: Soft and nontender.  MS: No deformity or atrophy. Gait and ROM intact.  Skin: Warm and dry. Color is normal.  Neuro:  Strength and sensation are intact and no gross focal deficits noted.  Psych: Alert, appropriate and with normal affect.   LABORATORY DATA:  EKG:  EKG is ordered today. This demonstrates sinus bradycardia.  Lab Results  Component Value Date   WBC 11.8 (H) 11/21/2015   HGB 14.7 11/21/2015   HCT 41.8 11/21/2015   PLT 304 11/21/2015   GLUCOSE 106 (H) 07/06/2016   CHOL 113 07/06/2016   TRIG 84 07/06/2016   HDL 54 07/06/2016   LDLCALC 42 07/06/2016   ALT 29 07/06/2016   AST 22 07/06/2016   NA 133 (L) 07/06/2016   K 4.8 07/06/2016   CL 97 07/06/2016   CREATININE 1.03 07/06/2016   BUN 10 07/06/2016   CO2 16 (L) 07/06/2016   TSH 0.969 07/17/2015   PSA 0.71 07/03/2014   INR 1.11 07/18/2015    BNP (last 3 results) No results for input(s): BNP in the last 8760 hours.  ProBNP (last 3 results) No results for input(s): PROBNP in the last 8760 hours.   Other Studies Reviewed Today: Procedures   Coronary Stent Intervention 06/2015  Conclusion   1. Ost RCA to Dist RCA lesion, 40% stenosed. 2. A drug-eluting stent was placed. 3. Prox Cx to Dist Cx lesion,  60% stenosed. 4. 1st Mrg lesion, 65% stenosed. 5. 1st Diag lesion, 95% stenosed. There is a 50% residual stenosis post intervention. 6. A drug-eluting stent was placed.    PTCA, followed by rotational atherectomy, followed by stenting of the first diagonal resulting in a less than optimal stent expansion with a 95% stenosis being reduced to 50%. Brisk flow was noted. No evidence of distal or proximal dissection was noted.   Recommendations:   Dual antiplatelet therapy should be continued 12-18 months.  Patient will be a greater than usual risk of stent thrombosis in the diagonal due to inability to fully expand the stent.    Echo Study Conclusions from 06/2015  - Left ventricle: The cavity size was normal. Wall thickness was   increased in a pattern of moderate LVH. Systolic function was   normal. The estimated ejection fraction was in the range of 60%   to 65%. Doppler parameters are consistent with abnormal left   ventricular relaxation (grade 1 diastolic dysfunction).   Assessment/Plan: 1. CAD S/P percutaneous coronary angioplasty/stenting in 06/2015 - recommended DAPT for 12 to 18 months - he feels good on Brilinta and does not wish to change. He is asking about the timing for his dental implant - will speak with Dr. Tamala Julian. He currently has no symptoms - needs to get back on track with CV risk factor modification - he seems motivated. No change in his medicines for now.   2. Essential hypertension - BP looks good on his current regimen.   3. Dyslipidemia - This is followed by Dr.Luking, his PCP. He has been on statin therapy for years. Recent labs noted.   4. Centrilobular emphysema ([place]) - Likely source of dyspnea on exertion but currently not an issue.  Current medicines are reviewed with the patient today.  The patient does not have  concerns regarding medicines other than what has been noted above.  The following changes have been made:  See above.  Labs/ tests  ordered today include:   No orders of the defined types were placed in this encounter.    Disposition:   FU with Dr. Tamala Julian in 6 months.   Patient is agreeable to this plan and will call if any problems develop in the interim.   Signed: Burtis Junes, RN, ANP-C 08/31/2016 2:30 PM  [facility] [address] [place], [place]  [address] Phone: [phone] Fax: [phone]      Addendum: Message from Dr. Tamala Julian 09/01/16 regarding the Brilinta and dental implant  "Okay to pause Brilinta then resume to complete 18 months".

## 2016-08-31 NOTE — Patient Instructions (Addendum)
We will be checking the following labs today - NONE   Medication Instructions:    Continue with your current medicines.      Testing/Procedures To Be Arranged:  N/A  Follow-Up:   See Dr. Tamala Julian in 6 months.     Other Special Instructions:   I will send Dr. Tamala Julian a message regarding stopping your Brilinta for dental implant.   Get back on track with diet/weight/exercise    If you need a refill on your cardiac medications before your next appointment, please call your pharmacy.   Call the [facility] office at [phone] if you have any questions, problems or concerns.

## 2016-09-01 ENCOUNTER — Telehealth: Payer: Self-pay | Admitting: *Deleted

## 2016-09-01 NOTE — Telephone Encounter (Signed)
Left message on machine for pt to contact the office.   

## 2016-09-01 NOTE — Telephone Encounter (Signed)
Pt returned call to [place] call back

## 2016-09-01 NOTE — Telephone Encounter (Signed)
S/w pt is aware of pausing Brilinta for dental implant.  Pt stated will hold off having implant till off of Brilinta.

## 2016-09-01 NOTE — Telephone Encounter (Signed)
-----   Message from Burtis Junes, NP sent at 09/01/2016 12:57 PM EST ----- Please call Mr. Candelaria and let him know that he can stop the Brilinta if he wishes to proceed on with his dental implant and then plan on resuming for a total of 18 months of therapy.   Cecille Rubin ----- Message ----- From: Belva Crome, MD Sent: 09/01/2016  12:50 PM To: Burtis Junes, NP  Okay to pause Brilinta then resume to complete 18 months. ----- Message ----- From: Burtis Junes, NP Sent: 08/31/2016   2:44 PM To: Belva Crome, MD  Dr. Tamala Julian,  Mr. Buzek is s/p stenting from 06/2015 - your cath note recommends 12 to 18 months of DAPT due to stent not expanded as much as you would have preferred.   He is asking about dental implant surgery - are you ok with him proceeding and holding his Brilinta or hold off until March - he is ok either way.   Cecille Rubin

## 2016-09-03 DIAGNOSIS — M9903 Segmental and somatic dysfunction of lumbar region: Secondary | ICD-10-CM | POA: Diagnosis not present

## 2016-09-03 DIAGNOSIS — M5432 Sciatica, left side: Secondary | ICD-10-CM | POA: Diagnosis not present

## 2016-09-07 DIAGNOSIS — M5432 Sciatica, left side: Secondary | ICD-10-CM | POA: Diagnosis not present

## 2016-09-07 DIAGNOSIS — M9903 Segmental and somatic dysfunction of lumbar region: Secondary | ICD-10-CM | POA: Diagnosis not present

## 2016-09-08 ENCOUNTER — Telehealth: Payer: Self-pay | Admitting: Nurse Practitioner

## 2016-09-08 NOTE — Telephone Encounter (Signed)
Pt called in to get a muscle relaxer script from Dr. Tamala Julian.  Stated this office does not fill non cardiac medications. Pt stated had back surgery and re-injured back and is having a hard time sleeping. Pt also stated is going to chiropractor. Pt stated did not want to go on any medication that would interfere with cardiac medications. Stated pt will call Dr. Lance Sell office for muscle relaxer and Dr. [name] should be able to look at pt's medication list to see what pt could take.  Will route  to Truitt Merle, NP, to [facility].

## 2016-09-08 NOTE — Telephone Encounter (Signed)
Agree 

## 2016-09-08 NOTE — Telephone Encounter (Signed)
New Message  Pt call requesting to speak with RN. Pt state he would like to ask some questions about his last office visit. Please call back to discuss

## 2016-09-09 DIAGNOSIS — M5432 Sciatica, left side: Secondary | ICD-10-CM | POA: Diagnosis not present

## 2016-09-09 DIAGNOSIS — M9903 Segmental and somatic dysfunction of lumbar region: Secondary | ICD-10-CM | POA: Diagnosis not present

## 2016-09-14 DIAGNOSIS — M9903 Segmental and somatic dysfunction of lumbar region: Secondary | ICD-10-CM | POA: Diagnosis not present

## 2016-09-14 DIAGNOSIS — M5432 Sciatica, left side: Secondary | ICD-10-CM | POA: Diagnosis not present

## 2016-10-05 DIAGNOSIS — M9903 Segmental and somatic dysfunction of lumbar region: Secondary | ICD-10-CM | POA: Diagnosis not present

## 2016-10-05 DIAGNOSIS — M5432 Sciatica, left side: Secondary | ICD-10-CM | POA: Diagnosis not present

## 2016-10-06 DIAGNOSIS — H40013 Open angle with borderline findings, low risk, bilateral: Secondary | ICD-10-CM | POA: Diagnosis not present

## 2016-10-28 DIAGNOSIS — M5432 Sciatica, left side: Secondary | ICD-10-CM | POA: Diagnosis not present

## 2016-10-28 DIAGNOSIS — M9903 Segmental and somatic dysfunction of lumbar region: Secondary | ICD-10-CM | POA: Diagnosis not present

## 2016-11-25 DIAGNOSIS — M5432 Sciatica, left side: Secondary | ICD-10-CM | POA: Diagnosis not present

## 2016-11-25 DIAGNOSIS — M9903 Segmental and somatic dysfunction of lumbar region: Secondary | ICD-10-CM | POA: Diagnosis not present

## 2016-12-06 ENCOUNTER — Other Ambulatory Visit: Payer: Self-pay | Admitting: Cardiology

## 2016-12-17 NOTE — Progress Notes (Signed)
Cardiology Office Note    Date:  12/18/2016   ID:  Michaelanthony, Raysor 19-Jun-1946, MRN [id number]  PCP:  Mickie Hillier, MD  Cardiologist: Sinclair Grooms, MD   Chief Complaint  Patient presents with  . Coronary Artery Disease    History of Present Illness:  Adam Rentschler Blodgett is a 71 y.o. male has a history of CAD with drug-eluting stent implantation in setting of non-ST elevation MI in PDA and diagonal back in 2016. Diagonal stenting was associated with inability to fully expand the drug-eluting stent. Also history of emphysema, hypertension and hyperlipidemia.  Totally asymptomatic. Request permission to pause dual antiplatelet therapy to proceed with a dental implant. He denies shortness of breath. There is no orthopnea or chest pain. He is physically active and not limited..   Past Medical History:  Diagnosis Date  . Allergy   . COPD (chronic obstructive pulmonary disease) ([place])   . CVA (cerebral vascular accident) ([place]) 2012   denies residual on 07/22/2015  . ED (erectile dysfunction)   . Family history of adverse reaction to anesthesia    "think my mother had real bad headaches after anesthesia"  . GERD (gastroesophageal reflux disease)   . Hypercholesterolemia   . Hypertension   . IFG (impaired fasting glucose)   . NSTEMI (non-ST elevated myocardial infarction) ([place])   . Pneumonia ~ 2005    Past Surgical History:  Procedure Laterality Date  . [facility]CTOMY  2009 [facility]  . BACK SURGERY    . BIOPSY  05/04/2014   Procedure: BIOPSY;  Surgeon: Danie Binder, MD;  [facility];  Service: Endoscopy;;  . CARDIAC CATHETERIZATION N/A 07/18/2015   Procedure: Left Heart Cath and Coronary Angiography;  Surgeon: Belva Crome, MD;  Location: [facility];  Service: Cardiovascular;  Laterality: N/A;  . CARDIAC CATHETERIZATION N/A 07/18/2015   Procedure: Coronary Stent Intervention;  Surgeon: Belva Crome, MD;  Location: [facility];  Service: Cardiovascular;   Laterality: N/A;  . CARDIAC CATHETERIZATION N/A 07/22/2015   Procedure: Coronary Stent Intervention;  Surgeon: Belva Crome, MD;  Location: [facility];  Service: Cardiovascular;  Laterality: N/A;  . COLONOSCOPY  2009  . ESOPHAGOGASTRODUODENOSCOPY N/A 05/04/2014   Procedure: ESOPHAGOGASTRODUODENOSCOPY (EGD);  Surgeon: Danie Binder, MD;  [facility];  Service: Endoscopy;  Laterality: N/A;  9:15  . POSTERIOR LAMINECTOMY / DECOMPRESSION LUMBAR SPINE  06/03/2015  . SAVORY DILATION N/A 05/04/2014   Procedure: SAVORY DILATION;  Surgeon: Danie Binder, MD;  [facility];  Service: Endoscopy;  Laterality: N/A;    Current Medications: Outpatient Medications Prior to Visit  Medication Sig Dispense Refill  . acetaminophen (TYLENOL) 325 MG tablet Take 2 tablets (650 mg total) by mouth every 4 (four) hours as needed for headache or mild pain.    Marland Kitchen albuterol (PROVENTIL HFA;VENTOLIN HFA) 108 (90 Base) MCG/ACT inhaler Inhale 2 puffs into the lungs every 6 (six) hours as needed for wheezing or shortness of breath. 1 Inhaler 2  . amLODipine (NORVASC) 10 MG tablet take 1 tablet by mouth once daily 30 tablet 5  . Ascorbic Acid (VITAMIN C PO) Take 1 tablet by mouth daily.     Marland Kitchen aspirin EC 81 MG tablet Take 81 mg by mouth daily.    Marland Kitchen atorvastatin (LIPITOR) 80 MG tablet Take 1 tablet (80 mg total) by mouth daily. 30 tablet 5  . benazepril (LOTENSIN) 20 MG tablet take 1 tablet  by mouth once daily 30 tablet 5  . BRILINTA 90 MG TABS tablet TAKE 1 TABLET BY MOUTH TWICE DAILY 60 tablet 4  . cefUROXime (CEFTIN) 500 MG tablet Take 1 tablet (500 mg total) by mouth 2 (two) times daily with a meal. 20 tablet 0  . chlorzoxazone (PARAFON FORTE DSC) 500 MG tablet Take 1 tablet (500 mg total) by mouth 3 (three) times daily as needed for muscle spasms. 24 tablet 0  . Cyanocobalamin (VITAMIN B-12 CR) 1000 MCG TBCR Take 1 tablet by mouth daily.    . fish oil-omega-3 fatty acids 1000 MG capsule Take 2  g by mouth daily.    . fluticasone (FLONASE) 50 MCG/ACT nasal spray Place 1 spray into both nostrils daily as needed for allergies or rhinitis.     Marland Kitchen latanoprost (XALATAN) 0.005 % ophthalmic solution Place 1 drop into both eyes at bedtime.     . metoprolol succinate (TOPROL-XL) 50 MG 24 hr tablet take 1 tablet by mouth once daily 30 tablet 5  . Multiple Vitamins-Minerals (PRESERVISION AREDS PO) Take by mouth 2 (two) times daily.    . nitroGLYCERIN (NITROSTAT) 0.4 MG SL tablet Place 0.4 mg under the tongue every 5 (five) minutes as needed for chest pain (maxium 3 tablets daily). Do not take within 36 hrs of Cialis use    . pantoprazole (PROTONIX) 40 MG tablet take 1 tablet by mouth every morning 30 tablet 5  . tadalafil (CIALIS) 20 MG tablet Take 1 tablet (20 mg total) by mouth daily as needed for erectile dysfunction (take one tablet  two hours prior to intercourse). 10 tablet 5  . Zinc Acetate 25 MG CAPS Take 50 mg by mouth daily.     No facility-administered medications prior to visit.      Allergies:   Patient has no known allergies.   Social History   Social History  . Marital status: Divorced    Spouse name: N/A  . Number of children: N/A  . Years of education: N/A   Social History Main Topics  . Smoking status: Former Smoker    Packs/day: 2.00    Years: 24.00    Types: Cigarettes    Quit date: 12/24/1985  . Smokeless tobacco: Never Used  . Alcohol use 4.2 oz/week    7 Cans of beer per week  . Drug use: No  . Sexual activity: Yes   Other Topics Concern  . None   Social History Narrative  . None     Family History:  The patient's family history includes Heart attack in his father; Hypertension in his father and mother; Stroke in his father.   ROS:   Please see the history of present illness.    Low back discomfort. Dental issues. Needs a dental implant performed.  All other systems reviewed and are negative.   PHYSICAL EXAM:   VS:  BP (!) 154/80 (BP Location: Left  Arm)   Pulse 67   Ht 6' (1.829 m)   Wt 260 lb 12.8 oz (118.3 kg)   BMI 35.37 kg/m    Repeat blood pressure 138/80 mmHg. GEN: Well nourished, well developed, in no acute distress . Obese. HEENT: normal  Neck: no JVD, carotid bruits, or masses Cardiac: RRR; no murmurs, rubs, or gallops,no edema  Respiratory:  clear to auscultation bilaterally, normal work of breathing GI: soft, nontender, nondistended, + BS MS: no deformity or atrophy  Skin: warm and dry, no rash Neuro:  Alert and Oriented x 3,  Strength and sensation are intact Psych: euthymic mood, full affect  Wt Readings from Last 3 Encounters:  12/18/16 260 lb 12.8 oz (118.3 kg)  08/31/16 259 lb (117.5 kg)  07/06/16 254 lb 6 oz (115.4 kg)      Studies/Labs Reviewed:   EKG:  EKG  Not done  Recent Labs: 07/06/2016: ALT 29; BUN 10; Creatinine, Ser 1.03; Potassium 4.8; Sodium 133   Lipid Panel    Component Value Date/Time   CHOL 113 07/06/2016 0933   TRIG 84 07/06/2016 0933   HDL 54 07/06/2016 0933   CHOLHDL 2.1 07/06/2016 0933   CHOLHDL 2.9 07/03/2014 0814   VLDL 21 07/03/2014 0814   LDLCALC 42 07/06/2016 0933    Additional studies/ records that were reviewed today include:  Cardiac catheterization and PCI September 2016:Coronary Diagrams  Coronary Diagrams  Diagnostic Diagram    Post-Intervention Diagram       ASSESSMENT:    1. CAD S/P percutaneous coronary angioplasty   2. Essential hypertension, benign   3. Other emphysema ([place])   4. History of CVA 2012      PLAN:  In order of problems listed above:  1. Asymptomatic with reference to ischemic symptoms. Note the technical issues with suboptimal stent expansion in the diagonal. We will stop Brilinta and start clopidogrel 75 mg daily. He has permission to cause aspirin and Plavix for up to 7 days prior to procedures and then resume after it is safe. 2. 2 g sodium diet, weight loss, and blood pressure less than 140/90 mmHg are discussed.  Local  follow-up in one year.  Medication Adjustments/Labs and Tests Ordered: Current medicines are reviewed at length with the patient today.  Concerns regarding medicines are outlined above.  Medication changes, Labs and Tests ordered today are listed in the Patient Instructions below. Patient Instructions  Medication Instructions:  1) DISCONTINUE Brilinta 2) START Plavix 75mg  once daily 3) You may stop your Plavix 7 days prior to your dental implant.   Labwork: None  Testing/Procedures: None  Follow-Up: Your physician wants you to follow-up in: 1 year with Dr. Tamala Julian. You will receive a reminder letter in the mail two months in advance. If you don't receive a letter, please call our office to schedule the follow-up appointment.   Any Other Special Instructions Will Be Listed Below (If Applicable).     If you need a refill on your cardiac medications before your next appointment, please call your pharmacy.      Signed, Sinclair Grooms, MD  12/18/2016 9:37 AM    [facility] [place], [place], [place]  [address] Phone: [phone]; Fax: [phone]

## 2016-12-18 ENCOUNTER — Ambulatory Visit (INDEPENDENT_AMBULATORY_CARE_PROVIDER_SITE_OTHER): Payer: Medicare Other | Admitting: Interventional Cardiology

## 2016-12-18 ENCOUNTER — Encounter: Payer: Self-pay | Admitting: Interventional Cardiology

## 2016-12-18 VITALS — BP 154/80 | HR 67 | Ht 72.0 in | Wt 260.8 lb

## 2016-12-18 DIAGNOSIS — J438 Other emphysema: Secondary | ICD-10-CM

## 2016-12-18 DIAGNOSIS — I10 Essential (primary) hypertension: Secondary | ICD-10-CM | POA: Diagnosis not present

## 2016-12-18 DIAGNOSIS — I2510 Atherosclerotic heart disease of native coronary artery without angina pectoris: Secondary | ICD-10-CM

## 2016-12-18 DIAGNOSIS — Z9861 Coronary angioplasty status: Secondary | ICD-10-CM

## 2016-12-18 DIAGNOSIS — Z8673 Personal history of transient ischemic attack (TIA), and cerebral infarction without residual deficits: Secondary | ICD-10-CM | POA: Diagnosis not present

## 2016-12-18 NOTE — Patient Instructions (Addendum)
Medication Instructions:  1) DISCONTINUE Brilinta 2) START Plavix 75mg  once daily 3) You may stop your Plavix 7 days prior to your dental implant.   Labwork: None  Testing/Procedures: None  Follow-Up: Your physician wants you to follow-up in: 1 year with Dr. Tamala Julian. You will receive a reminder letter in the mail two months in advance. If you don't receive a letter, please call our office to schedule the follow-up appointment.   Any Other Special Instructions Will Be Listed Below (If Applicable).     If you need a refill on your cardiac medications before your next appointment, please call your pharmacy.

## 2016-12-21 ENCOUNTER — Other Ambulatory Visit: Payer: Self-pay | Admitting: *Deleted

## 2016-12-21 ENCOUNTER — Other Ambulatory Visit: Payer: Self-pay | Admitting: Interventional Cardiology

## 2016-12-21 ENCOUNTER — Telehealth: Payer: Self-pay | Admitting: Interventional Cardiology

## 2016-12-21 MED ORDER — CLOPIDOGREL BISULFATE 75 MG PO TABS: 75.0000 mg | ORAL_TABLET | Freq: Every day | ORAL | 3 refills | Status: DC

## 2016-12-21 MED ORDER — NITROGLYCERIN 0.4 MG SL SUBL: 0.4000 mg | SUBLINGUAL_TABLET | SUBLINGUAL | 2 refills | Status: DC | PRN

## 2016-12-21 MED ORDER — NITROGLYCERIN 0.4 MG SL SUBL: 0.4000 mg | SUBLINGUAL_TABLET | SUBLINGUAL | 3 refills | Status: DC | PRN

## 2016-12-21 NOTE — Telephone Encounter (Signed)
Mitchell Oliver is wanting to speak someone about his visit on Friday and asking that someone calls him back .  Thanks

## 2016-12-21 NOTE — Telephone Encounter (Signed)
Patient called and said that his pharmacy didn't receive a script for Plavix 75mg  that he was told to start taking.  Also asked for a Nitrostat refill.  Said that he hasn't taken any but they have deteriorated and turned to powder.  I checked to see if the Plavix was sent, it hadn't been.  I sent scripts for both the Plavix and the Nitrostat to his pharmacy.

## 2016-12-23 DIAGNOSIS — M9903 Segmental and somatic dysfunction of lumbar region: Secondary | ICD-10-CM | POA: Diagnosis not present

## 2016-12-23 DIAGNOSIS — M5432 Sciatica, left side: Secondary | ICD-10-CM | POA: Diagnosis not present

## 2016-12-29 ENCOUNTER — Other Ambulatory Visit: Payer: Self-pay | Admitting: Family Medicine

## 2017-01-04 ENCOUNTER — Ambulatory Visit: Payer: Self-pay | Admitting: Family Medicine

## 2017-01-12 ENCOUNTER — Ambulatory Visit (INDEPENDENT_AMBULATORY_CARE_PROVIDER_SITE_OTHER): Payer: Medicare Other | Admitting: Family Medicine

## 2017-01-12 ENCOUNTER — Encounter: Payer: Self-pay | Admitting: Family Medicine

## 2017-01-12 VITALS — BP 118/82 | Ht 72.0 in | Wt 258.0 lb

## 2017-01-12 DIAGNOSIS — R7301 Impaired fasting glucose: Secondary | ICD-10-CM | POA: Diagnosis not present

## 2017-01-12 DIAGNOSIS — I10 Essential (primary) hypertension: Secondary | ICD-10-CM | POA: Diagnosis not present

## 2017-01-12 DIAGNOSIS — R7303 Prediabetes: Secondary | ICD-10-CM | POA: Diagnosis not present

## 2017-01-12 DIAGNOSIS — E785 Hyperlipidemia, unspecified: Secondary | ICD-10-CM

## 2017-01-12 DIAGNOSIS — Z79899 Other long term (current) drug therapy: Secondary | ICD-10-CM | POA: Diagnosis not present

## 2017-01-12 MED ORDER — ATORVASTATIN CALCIUM 80 MG PO TABS: 80.0000 mg | ORAL_TABLET | Freq: Every day | ORAL | 5 refills | Status: DC

## 2017-01-12 MED ORDER — BENAZEPRIL HCL 20 MG PO TABS: 20.0000 mg | ORAL_TABLET | Freq: Every day | ORAL | 5 refills | Status: DC

## 2017-01-12 MED ORDER — METOPROLOL SUCCINATE ER 50 MG PO TB24: 50.0000 mg | ORAL_TABLET | Freq: Every day | ORAL | 5 refills | Status: DC

## 2017-01-12 MED ORDER — AMLODIPINE BESYLATE 10 MG PO TABS: 10.0000 mg | ORAL_TABLET | Freq: Every day | ORAL | 5 refills | Status: DC

## 2017-01-12 MED ORDER — PANTOPRAZOLE SODIUM 40 MG PO TBEC: 40.0000 mg | DELAYED_RELEASE_TABLET | Freq: Every morning | ORAL | 5 refills | Status: DC

## 2017-01-12 NOTE — Progress Notes (Signed)
   Subjective:    Patient ID: Mitchell Oliver, male    DOB: 1946-04-24, 71 y.o.   MRN: [id number]  Hyperlipidemia  This is a chronic problem. The current episode started more than 1 year ago. Treatments tried: lipitor. There are no compliance problems.  Risk factors for coronary artery disease include dyslipidemia and hypertension.   No problems or concerns  Blood pressure medicine and blood pressure levels reviewed today with patient. Compliant with blood pressure medicine. States does not miss a dose. No obvious side effects. Blood pressure generally good when checked elsewhere. Watching salt intake.  Patient continues to take lipid medication regularly. No obvious side effects from it. Generally does not miss a dose. Prior blood work results are reviewed with patient. Patient continues to work on fat intake in diet  prediabetres disc, pt mo and uncles had the diabetes eercise so so due to multi reasons  Grilled chkn eating well   Using no grese with cooking    Copd overall very stable    Known coronary artery disease. Status post stenting. Asymptomatic.  Known COPD. No significant shortness of breath  History of stroke no new TIA symptomatology Review of Systems No headache, no major weight loss or weight gain, no chest pain no back pain abdominal pain no change in bowel habits complete ROS otherwise negative     Objective:   Physical Exam  Alert and oriented, vitals reviewed and stable, NAD ENT-TM's and ext canals WNL bilat via otoscopic exam Soft palate, tonsils and post pharynx WNL via oropharyngeal exam Neck-symmetric, no masses; thyroid nonpalpable and nontender Pulmonary-no tachypnea or accessory muscle use; Clear without wheezes via auscultation Card--no abnrml murmurs, rhythm reg and rate WNL Carotid pulses symmetric, without bruits       Assessment & Plan:  Impression 1 hypertension good control discussed maintain same meds #2 hyperlipidemia prior blood  work reviewed will maintain same meds pending further results #3 coronary artery disease asymptomatic or 4 status post stroke asymptomatic #5 history COPD clinically stable #6 prediabetes discussed at length plan diet exercise discussed. Appropriate blood work. Medications refilled. Check in 6 months for wellness plus chronic visit

## 2017-01-13 ENCOUNTER — Other Ambulatory Visit: Payer: Self-pay | Admitting: Cardiology

## 2017-01-13 ENCOUNTER — Other Ambulatory Visit: Payer: Self-pay | Admitting: Family Medicine

## 2017-01-13 DIAGNOSIS — M9903 Segmental and somatic dysfunction of lumbar region: Secondary | ICD-10-CM | POA: Diagnosis not present

## 2017-01-13 DIAGNOSIS — M5432 Sciatica, left side: Secondary | ICD-10-CM | POA: Diagnosis not present

## 2017-01-13 LAB — HEPATIC FUNCTION PANEL
ALBUMIN: 4.8 g/dL (ref 3.5–4.8)
ALT: 36 IU/L (ref 0–44)
AST: 29 IU/L (ref 0–40)
Alkaline Phosphatase: 49 IU/L (ref 39–117)
Bilirubin Total: 1.4 mg/dL — ABNORMAL HIGH (ref 0.0–1.2)
Bilirubin, Direct: 0.36 mg/dL (ref 0.00–0.40)
Total Protein: 7.7 g/dL (ref 6.0–8.5)

## 2017-01-13 LAB — LIPID PANEL
CHOLESTEROL TOTAL: 116 mg/dL (ref 100–199)
Chol/HDL Ratio: 2.5 ratio units (ref 0.0–5.0)
HDL: 47 mg/dL (ref >39)
LDL Calculated: 46 mg/dL (ref 0–99)
Triglycerides: 114 mg/dL (ref 0–149)
VLDL CHOLESTEROL CAL: 23 mg/dL (ref 5–40)

## 2017-01-13 LAB — GLUCOSE, RANDOM: Glucose: 112 mg/dL — ABNORMAL HIGH (ref 65–99)

## 2017-01-14 DIAGNOSIS — R7303 Prediabetes: Secondary | ICD-10-CM | POA: Insufficient documentation

## 2017-01-17 ENCOUNTER — Encounter: Payer: Self-pay | Admitting: Family Medicine

## 2017-01-20 DIAGNOSIS — M9903 Segmental and somatic dysfunction of lumbar region: Secondary | ICD-10-CM | POA: Diagnosis not present

## 2017-01-20 DIAGNOSIS — M5432 Sciatica, left side: Secondary | ICD-10-CM | POA: Diagnosis not present

## 2017-01-27 DIAGNOSIS — M5432 Sciatica, left side: Secondary | ICD-10-CM | POA: Diagnosis not present

## 2017-01-27 DIAGNOSIS — M9903 Segmental and somatic dysfunction of lumbar region: Secondary | ICD-10-CM | POA: Diagnosis not present

## 2017-01-31 ENCOUNTER — Other Ambulatory Visit: Payer: Self-pay | Admitting: Family Medicine

## 2017-02-02 DIAGNOSIS — M9903 Segmental and somatic dysfunction of lumbar region: Secondary | ICD-10-CM | POA: Diagnosis not present

## 2017-02-02 DIAGNOSIS — M5432 Sciatica, left side: Secondary | ICD-10-CM | POA: Diagnosis not present

## 2017-02-10 ENCOUNTER — Other Ambulatory Visit: Payer: Self-pay | Admitting: Family Medicine

## 2017-02-10 DIAGNOSIS — H40053 Ocular hypertension, bilateral: Secondary | ICD-10-CM | POA: Diagnosis not present

## 2017-02-10 DIAGNOSIS — M5432 Sciatica, left side: Secondary | ICD-10-CM | POA: Diagnosis not present

## 2017-02-10 DIAGNOSIS — M9903 Segmental and somatic dysfunction of lumbar region: Secondary | ICD-10-CM | POA: Diagnosis not present

## 2017-02-10 NOTE — Telephone Encounter (Signed)
Pt last seen 01/12/2017....please advise

## 2017-02-13 ENCOUNTER — Other Ambulatory Visit: Payer: Self-pay | Admitting: Family Medicine

## 2017-02-17 DIAGNOSIS — M5432 Sciatica, left side: Secondary | ICD-10-CM | POA: Diagnosis not present

## 2017-02-17 DIAGNOSIS — M9903 Segmental and somatic dysfunction of lumbar region: Secondary | ICD-10-CM | POA: Diagnosis not present

## 2017-02-22 DIAGNOSIS — M9903 Segmental and somatic dysfunction of lumbar region: Secondary | ICD-10-CM | POA: Diagnosis not present

## 2017-02-22 DIAGNOSIS — M5432 Sciatica, left side: Secondary | ICD-10-CM | POA: Diagnosis not present

## 2017-02-25 ENCOUNTER — Telehealth: Payer: Self-pay | Admitting: Interventional Cardiology

## 2017-02-25 DIAGNOSIS — M5432 Sciatica, left side: Secondary | ICD-10-CM | POA: Diagnosis not present

## 2017-02-25 DIAGNOSIS — M9903 Segmental and somatic dysfunction of lumbar region: Secondary | ICD-10-CM | POA: Diagnosis not present

## 2017-02-25 NOTE — Telephone Encounter (Signed)
New message     Pt is on Plavix and he has a question about ibuprophen, can he mix the two

## 2017-02-25 NOTE — Telephone Encounter (Signed)
Spoke with Dr. Tamala Julian and he said ok for pt to use x 2-3 days for aches or pains.  Spoke with pt and made him aware of recommendations.  Pt verbalized understanding and was appreciative for call.

## 2017-02-25 NOTE — Telephone Encounter (Signed)
°  New Message ° ° pt verbalized that he is calling for rn  °

## 2017-03-01 DIAGNOSIS — M9903 Segmental and somatic dysfunction of lumbar region: Secondary | ICD-10-CM | POA: Diagnosis not present

## 2017-03-01 DIAGNOSIS — M5432 Sciatica, left side: Secondary | ICD-10-CM | POA: Diagnosis not present

## 2017-03-08 DIAGNOSIS — M5432 Sciatica, left side: Secondary | ICD-10-CM | POA: Diagnosis not present

## 2017-03-08 DIAGNOSIS — M9903 Segmental and somatic dysfunction of lumbar region: Secondary | ICD-10-CM | POA: Diagnosis not present

## 2017-03-12 IMAGING — DX DG CHEST 2V
2 series · 2 of 2 positions shown · non-contrast
Comparison: Chest radiograph May 03, 2015

CLINICAL DATA: LEFT shoulder pain radiating to LEFT scapula after
eating dinner around 9 p.m. symptoms now resolved. Former smoker,
hypertension, history of stroke.

EXAM:
CHEST  2 VIEW

[chest pa]
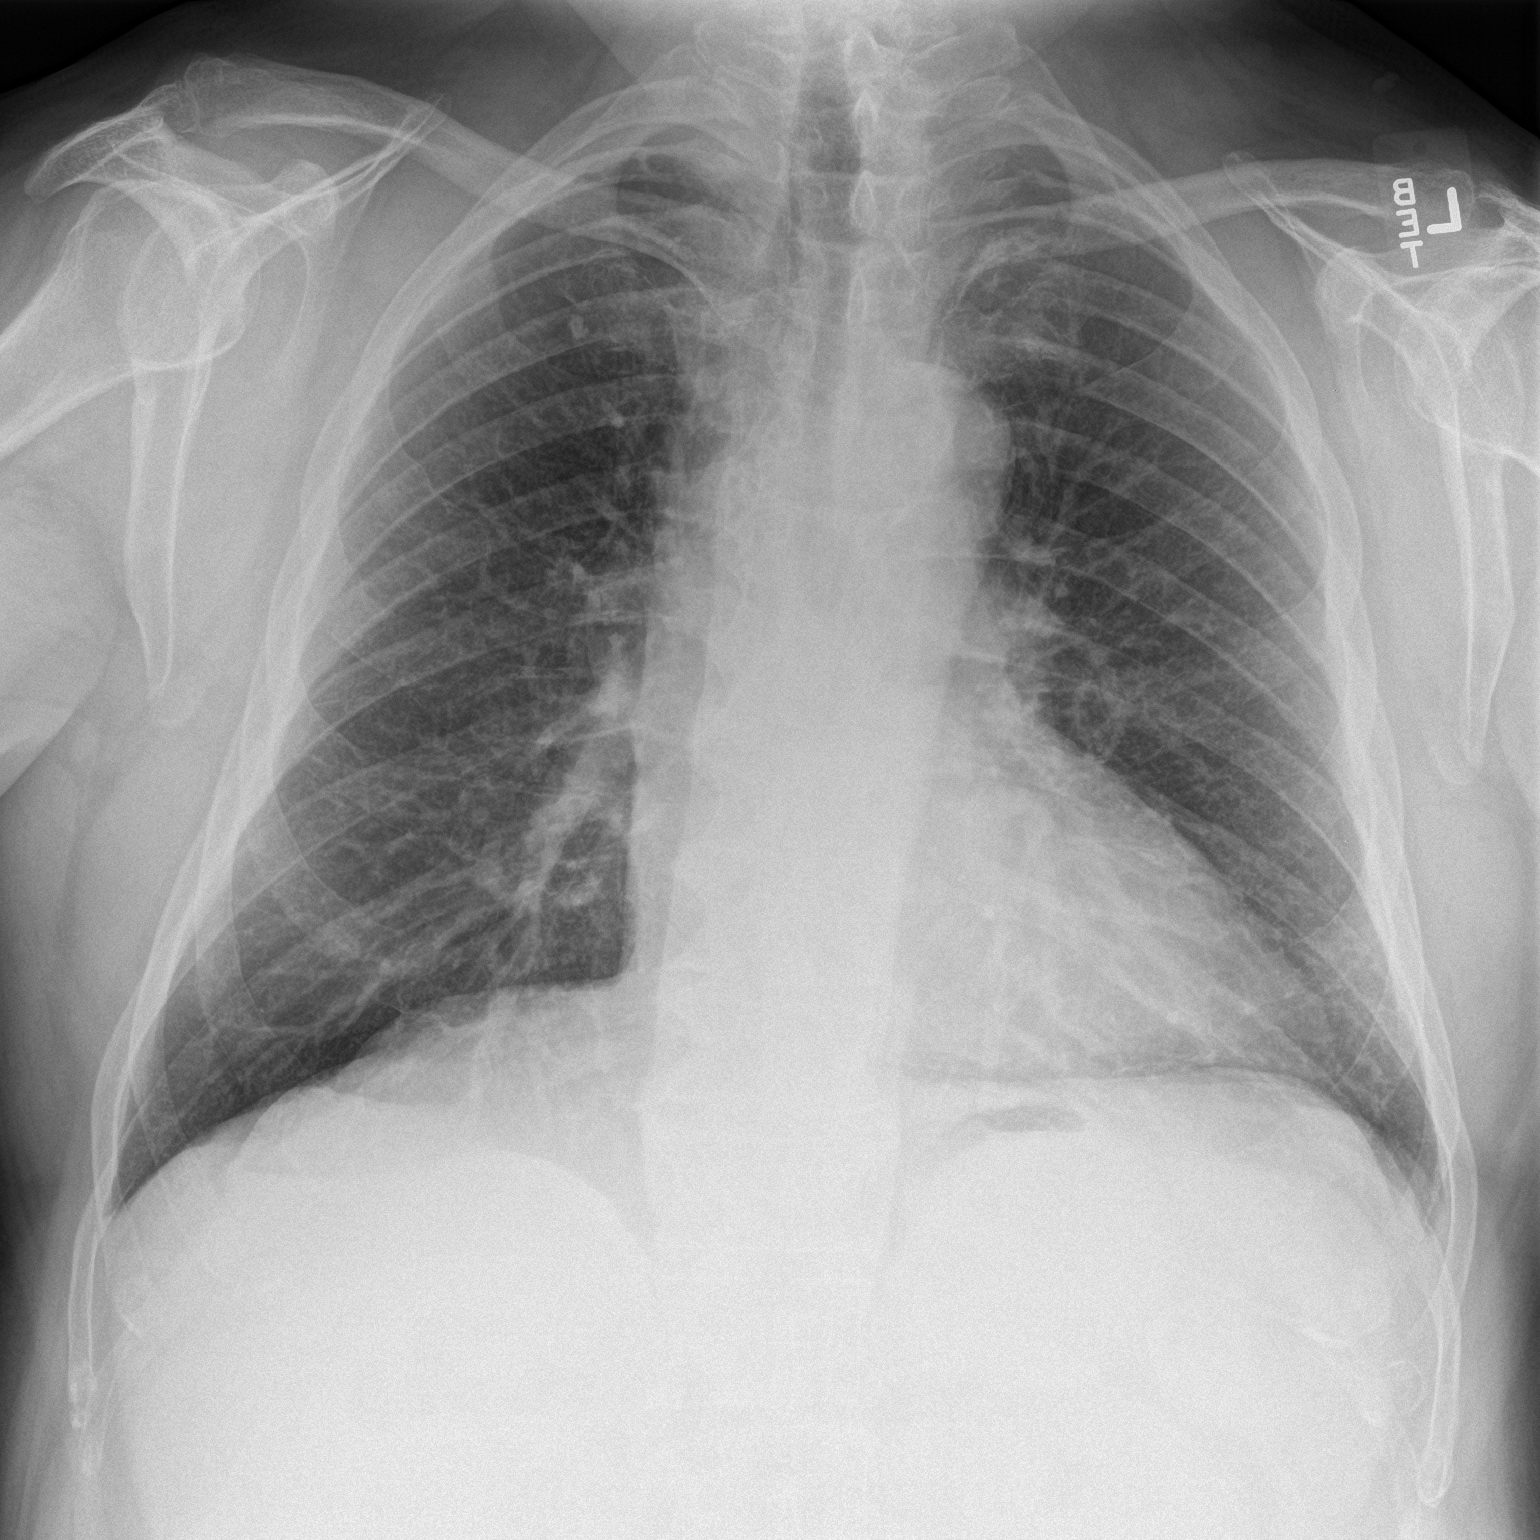

[chest lat]
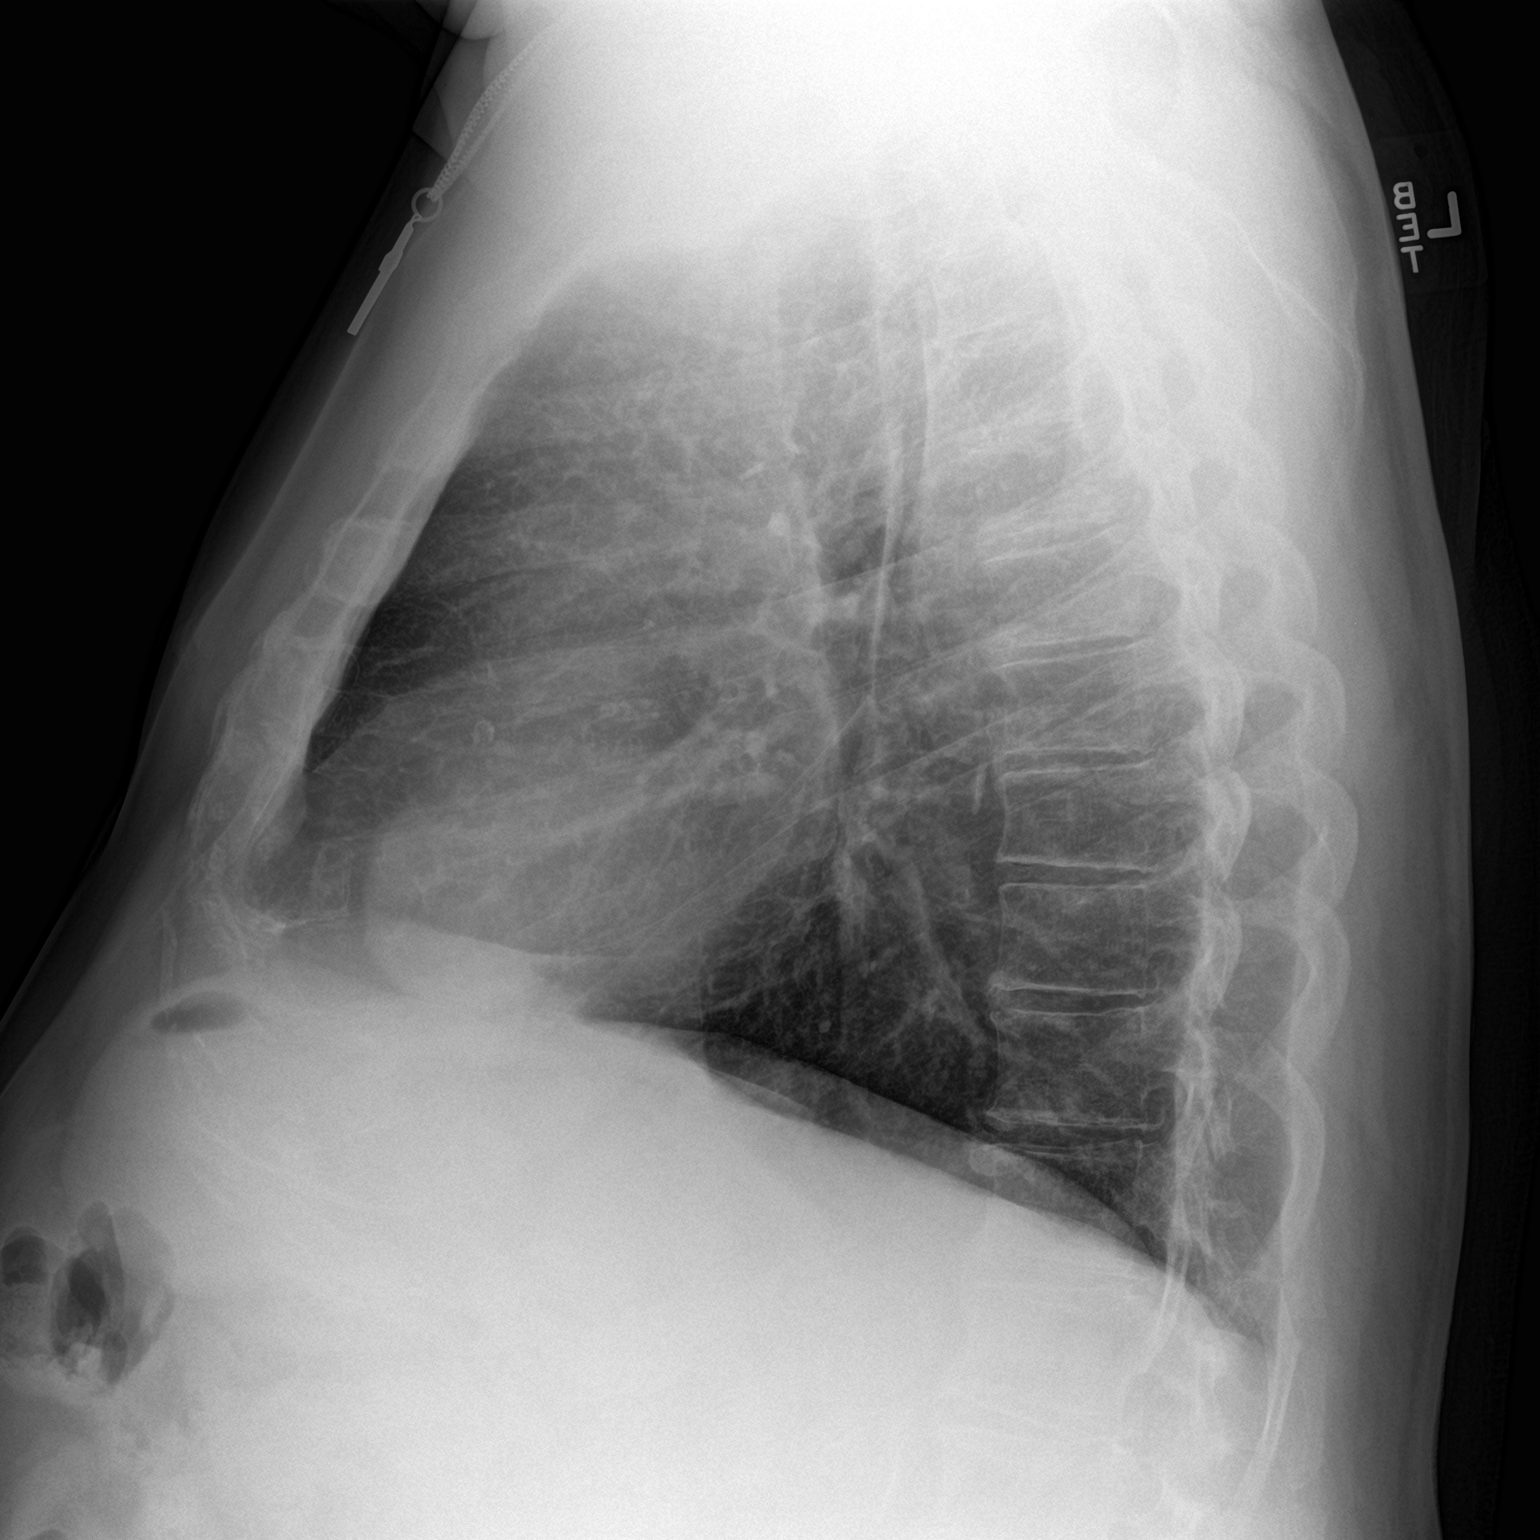

[2 of 2 positions shown; findings below may reference images not displayed]

FINDINGS: The cardiac silhouette is upper limits of normal in size, unchanged.
Mediastinal silhouette is nonsuspicious. Mild chronic interstitial
changes increased lung volumes compatible with COPD. No pleural
effusion or focal consolidation. No pneumothorax. Soft tissue planes
and included osseous structures are nonsuspicious. Mild degenerative
change of the lower thoracic spine.
IMPRESSION: Borderline cardiomegaly and COPD; no superimposed acute
cardiopulmonary process.

## 2017-03-16 DIAGNOSIS — M9903 Segmental and somatic dysfunction of lumbar region: Secondary | ICD-10-CM | POA: Diagnosis not present

## 2017-03-16 DIAGNOSIS — M5432 Sciatica, left side: Secondary | ICD-10-CM | POA: Diagnosis not present

## 2017-03-25 DIAGNOSIS — M9903 Segmental and somatic dysfunction of lumbar region: Secondary | ICD-10-CM | POA: Diagnosis not present

## 2017-03-25 DIAGNOSIS — M5432 Sciatica, left side: Secondary | ICD-10-CM | POA: Diagnosis not present

## 2017-03-31 DIAGNOSIS — M5432 Sciatica, left side: Secondary | ICD-10-CM | POA: Diagnosis not present

## 2017-03-31 DIAGNOSIS — M9903 Segmental and somatic dysfunction of lumbar region: Secondary | ICD-10-CM | POA: Diagnosis not present

## 2017-04-29 DIAGNOSIS — M5432 Sciatica, left side: Secondary | ICD-10-CM | POA: Diagnosis not present

## 2017-04-29 DIAGNOSIS — M9903 Segmental and somatic dysfunction of lumbar region: Secondary | ICD-10-CM | POA: Diagnosis not present

## 2017-05-10 DIAGNOSIS — H40053 Ocular hypertension, bilateral: Secondary | ICD-10-CM | POA: Diagnosis not present

## 2017-05-20 DIAGNOSIS — M9903 Segmental and somatic dysfunction of lumbar region: Secondary | ICD-10-CM | POA: Diagnosis not present

## 2017-05-20 DIAGNOSIS — M5432 Sciatica, left side: Secondary | ICD-10-CM | POA: Diagnosis not present

## 2017-05-26 DIAGNOSIS — M9903 Segmental and somatic dysfunction of lumbar region: Secondary | ICD-10-CM | POA: Diagnosis not present

## 2017-05-26 DIAGNOSIS — M5432 Sciatica, left side: Secondary | ICD-10-CM | POA: Diagnosis not present

## 2017-06-03 DIAGNOSIS — M5432 Sciatica, left side: Secondary | ICD-10-CM | POA: Diagnosis not present

## 2017-06-03 DIAGNOSIS — M9903 Segmental and somatic dysfunction of lumbar region: Secondary | ICD-10-CM | POA: Diagnosis not present

## 2017-06-08 DIAGNOSIS — M9903 Segmental and somatic dysfunction of lumbar region: Secondary | ICD-10-CM | POA: Diagnosis not present

## 2017-06-08 DIAGNOSIS — M5432 Sciatica, left side: Secondary | ICD-10-CM | POA: Diagnosis not present

## 2017-06-15 DIAGNOSIS — M5432 Sciatica, left side: Secondary | ICD-10-CM | POA: Diagnosis not present

## 2017-06-15 DIAGNOSIS — M9903 Segmental and somatic dysfunction of lumbar region: Secondary | ICD-10-CM | POA: Diagnosis not present

## 2017-06-22 DIAGNOSIS — M9903 Segmental and somatic dysfunction of lumbar region: Secondary | ICD-10-CM | POA: Diagnosis not present

## 2017-06-22 DIAGNOSIS — M5432 Sciatica, left side: Secondary | ICD-10-CM | POA: Diagnosis not present

## 2017-06-29 DIAGNOSIS — M9903 Segmental and somatic dysfunction of lumbar region: Secondary | ICD-10-CM | POA: Diagnosis not present

## 2017-06-29 DIAGNOSIS — M5432 Sciatica, left side: Secondary | ICD-10-CM | POA: Diagnosis not present

## 2017-07-06 DIAGNOSIS — M5432 Sciatica, left side: Secondary | ICD-10-CM | POA: Diagnosis not present

## 2017-07-06 DIAGNOSIS — M9903 Segmental and somatic dysfunction of lumbar region: Secondary | ICD-10-CM | POA: Diagnosis not present

## 2017-07-07 ENCOUNTER — Other Ambulatory Visit: Payer: Self-pay | Admitting: Family Medicine

## 2017-07-15 ENCOUNTER — Ambulatory Visit (INDEPENDENT_AMBULATORY_CARE_PROVIDER_SITE_OTHER): Payer: Medicare Other | Admitting: Family Medicine

## 2017-07-15 ENCOUNTER — Encounter: Payer: Self-pay | Admitting: Family Medicine

## 2017-07-15 ENCOUNTER — Other Ambulatory Visit: Payer: Self-pay | Admitting: Family Medicine

## 2017-07-15 VITALS — BP 128/82 | Ht 72.0 in | Wt 262.0 lb

## 2017-07-15 DIAGNOSIS — Z79899 Other long term (current) drug therapy: Secondary | ICD-10-CM | POA: Diagnosis not present

## 2017-07-15 DIAGNOSIS — Z125 Encounter for screening for malignant neoplasm of prostate: Secondary | ICD-10-CM

## 2017-07-15 DIAGNOSIS — Z8673 Personal history of transient ischemic attack (TIA), and cerebral infarction without residual deficits: Secondary | ICD-10-CM | POA: Diagnosis not present

## 2017-07-15 DIAGNOSIS — Z0000 Encounter for general adult medical examination without abnormal findings: Secondary | ICD-10-CM

## 2017-07-15 DIAGNOSIS — I10 Essential (primary) hypertension: Secondary | ICD-10-CM

## 2017-07-15 DIAGNOSIS — E785 Hyperlipidemia, unspecified: Secondary | ICD-10-CM

## 2017-07-15 DIAGNOSIS — Z23 Encounter for immunization: Secondary | ICD-10-CM | POA: Diagnosis not present

## 2017-07-15 MED ORDER — ATORVASTATIN CALCIUM 80 MG PO TABS: 80.0000 mg | ORAL_TABLET | Freq: Every day | ORAL | 5 refills | Status: DC

## 2017-07-15 MED ORDER — PANTOPRAZOLE SODIUM 40 MG PO TBEC: 40.0000 mg | DELAYED_RELEASE_TABLET | Freq: Every morning | ORAL | 5 refills | Status: DC

## 2017-07-15 MED ORDER — BENAZEPRIL HCL 20 MG PO TABS: 20.0000 mg | ORAL_TABLET | Freq: Every day | ORAL | 5 refills | Status: DC

## 2017-07-15 MED ORDER — CEFUROXIME AXETIL 500 MG PO TABS: 500.0000 mg | ORAL_TABLET | Freq: Two times a day (BID) | ORAL | 0 refills | Status: DC

## 2017-07-15 MED ORDER — METOPROLOL SUCCINATE ER 50 MG PO TB24: 50.0000 mg | ORAL_TABLET | Freq: Every day | ORAL | 1 refills | Status: DC

## 2017-07-15 MED ORDER — AMLODIPINE BESYLATE 10 MG PO TABS: ORAL_TABLET | ORAL | 5 refills | Status: DC

## 2017-07-15 NOTE — Progress Notes (Signed)
Subjective:    Patient ID: Mitchell Oliver, male    DOB: 30-Jul-1946, 71 y.o.   MRN: [id number] Patient arrives office for wellness exam plus tremendous number of chronic problem HPI AWV- Annual Wellness Visit  The patient was seen for their annual wellness visit. The patient's past medical history, surgical history, and family history were reviewed. Pertinent vaccines were reviewed ( tetanus, pneumonia, shingles, flu) The patient's medication list was reviewed and updated.  The height and weight were entered. The patient's current BMI is:35.53   Cognitive screening was completed. Outcome of Mini - Cog: pass  Falls within the past 6 months:no  Current tobacco usage: no (All patients who use tobacco were given written and verbal information on quitting)  Recent listing of emergency department/hospitalizations over the past year were reviewed.  current specialist the patient sees on a regular basis: none   Medicare annual wellness visit patient questionnaire was reviewed.  A written screening schedule for the patient for the next 5-10 years was given. Appropriate discussion of followup regarding next visit was discussed.  Follow up on blood pressure-no problems or concerns   Patient continues to take lipid medication regularly. No obvious side effects from it. Generally does not miss a dose. Prior blood work results are reviewed with patient. Patient continues to work on fat intake in diet  Blood pressure medicine and blood pressure levels reviewed today with patient. Compliant with blood pressure medicine. States does not miss a dose. No obvious side effects. Blood pressure generally good when checked elsewhere. Watching salt intake.  Flu shot today  colonoscopy may   s p stroke   Diet overall has improved some but not the best, concerned about weight,  xercising reg around three d per wk    now working on diet hrder, pt has lost some weight   Patient has history of  stroke. No recent TIA symptoms. No new stroke symptoms.   Known coronary artery disease followed by cardiologist  Review of Systems  Constitutional: Negative for activity change, appetite change and fever.  HENT: Negative for congestion and rhinorrhea.   Eyes: Negative for discharge.  Respiratory: Negative for cough and wheezing.   Cardiovascular: Negative for chest pain.  Gastrointestinal: Negative for abdominal pain, blood in stool and vomiting.  Genitourinary: Negative for difficulty urinating and frequency.  Musculoskeletal: Negative for neck pain.  Skin: Negative for rash.  Allergic/Immunologic: Negative for environmental allergies and food allergies.  Neurological: Negative for weakness and headaches.  Psychiatric/Behavioral: Negative for agitation.       Objective:   Physical Exam  Constitutional: He appears well-developed and well-nourished.  HENT:  Head: Normocephalic and atraumatic.  Right Ear: External ear normal.  Left Ear: External ear normal.  Nose: Nose normal.  Mouth/Throat: Oropharynx is clear and moist.  Eyes: Pupils are equal, round, and reactive to light. EOM are normal.  Neck: Normal range of motion. Neck supple. No thyromegaly present.  Cardiovascular: Normal rate, regular rhythm and normal heart sounds.   No murmur heard. Pulmonary/Chest: Effort normal and breath sounds normal. No respiratory distress. He has no wheezes.  Abdominal: Soft. Bowel sounds are normal. He exhibits no distension and no mass. There is no tenderness.  Genitourinary: Penis normal.  Genitourinary Comments: Prostate exam within normal limits  Musculoskeletal: Normal range of motion. He exhibits no edema.  Lymphadenopathy:    He has no cervical adenopathy.  Neurological: He is alert. He exhibits normal muscle tone.  Skin: Skin is warm and dry.  No erythema.  Psychiatric: He has a normal mood and affect. His behavior is normal. Judgment normal.  Vitals reviewed.           Assessment & Plan:  Impression 1 wellness exam. Up-to-date on colonoscopy. Diet exercise discussed. Flu vaccine recommended  #2 hypertension good control discussed maintain same meds  #3 hyperlipidemia prior blood work reviewed and discussed maintain same meds pending #4 history of stroke no recent symptomatology no TIAs  #5 coronary artery disease Plan medications refilled. Diet exercise discussed. Flu shot. Appropriate blood work.

## 2017-07-16 LAB — LIPID PANEL
CHOL/HDL RATIO: 2.7 ratio (ref 0.0–5.0)
Cholesterol, Total: 112 mg/dL (ref 100–199)
HDL: 41 mg/dL (ref >39)
LDL Calculated: 52 mg/dL (ref 0–99)
TRIGLYCERIDES: 95 mg/dL (ref 0–149)
VLDL Cholesterol Cal: 19 mg/dL (ref 5–40)

## 2017-07-16 LAB — HEPATIC FUNCTION PANEL
ALK PHOS: 48 IU/L (ref 39–117)
ALT: 41 IU/L (ref 0–44)
AST: 45 IU/L — AB (ref 0–40)
Albumin: 4.8 g/dL (ref 3.5–4.8)
BILIRUBIN, DIRECT: 0.32 mg/dL (ref 0.00–0.40)
Bilirubin Total: 1.2 mg/dL (ref 0.0–1.2)
Total Protein: 7.5 g/dL (ref 6.0–8.5)

## 2017-07-16 LAB — BASIC METABOLIC PANEL
BUN/Creatinine Ratio: 6 — ABNORMAL LOW (ref 10–24)
BUN: 7 mg/dL — AB (ref 8–27)
CO2: 23 mmol/L (ref 20–29)
CREATININE: 1.2 mg/dL (ref 0.76–1.27)
Calcium: 10 mg/dL (ref 8.6–10.2)
Chloride: 97 mmol/L (ref 96–106)
GFR calc Af Amer: 69 mL/min/{1.73_m2} (ref >59)
GFR calc non Af Amer: 60 mL/min/{1.73_m2} (ref >59)
GLUCOSE: 106 mg/dL — AB (ref 65–99)
Potassium: 5.4 mmol/L — ABNORMAL HIGH (ref 3.5–5.2)
Sodium: 133 mmol/L — ABNORMAL LOW (ref 134–144)

## 2017-07-16 LAB — PSA: PROSTATE SPECIFIC AG, SERUM: 0.7 ng/mL (ref 0.0–4.0)

## 2017-07-18 ENCOUNTER — Encounter: Payer: Self-pay | Admitting: Family Medicine

## 2017-07-21 DIAGNOSIS — M5432 Sciatica, left side: Secondary | ICD-10-CM | POA: Diagnosis not present

## 2017-07-21 DIAGNOSIS — M9903 Segmental and somatic dysfunction of lumbar region: Secondary | ICD-10-CM | POA: Diagnosis not present

## 2017-07-27 DIAGNOSIS — M5432 Sciatica, left side: Secondary | ICD-10-CM | POA: Diagnosis not present

## 2017-07-27 DIAGNOSIS — M9903 Segmental and somatic dysfunction of lumbar region: Secondary | ICD-10-CM | POA: Diagnosis not present

## 2017-08-03 DIAGNOSIS — M9903 Segmental and somatic dysfunction of lumbar region: Secondary | ICD-10-CM | POA: Diagnosis not present

## 2017-08-03 DIAGNOSIS — M5432 Sciatica, left side: Secondary | ICD-10-CM | POA: Diagnosis not present

## 2017-08-04 ENCOUNTER — Other Ambulatory Visit: Payer: Self-pay | Admitting: Family Medicine

## 2017-08-09 DIAGNOSIS — H40013 Open angle with borderline findings, low risk, bilateral: Secondary | ICD-10-CM | POA: Diagnosis not present

## 2017-08-10 DIAGNOSIS — M5432 Sciatica, left side: Secondary | ICD-10-CM | POA: Diagnosis not present

## 2017-08-10 DIAGNOSIS — M9903 Segmental and somatic dysfunction of lumbar region: Secondary | ICD-10-CM | POA: Diagnosis not present

## 2017-08-17 DIAGNOSIS — M9903 Segmental and somatic dysfunction of lumbar region: Secondary | ICD-10-CM | POA: Diagnosis not present

## 2017-08-17 DIAGNOSIS — M5432 Sciatica, left side: Secondary | ICD-10-CM | POA: Diagnosis not present

## 2017-09-02 DIAGNOSIS — M9903 Segmental and somatic dysfunction of lumbar region: Secondary | ICD-10-CM | POA: Diagnosis not present

## 2017-09-02 DIAGNOSIS — M5432 Sciatica, left side: Secondary | ICD-10-CM | POA: Diagnosis not present

## 2017-09-21 DIAGNOSIS — M9903 Segmental and somatic dysfunction of lumbar region: Secondary | ICD-10-CM | POA: Diagnosis not present

## 2017-09-21 DIAGNOSIS — M5432 Sciatica, left side: Secondary | ICD-10-CM | POA: Diagnosis not present

## 2017-10-06 DIAGNOSIS — M9903 Segmental and somatic dysfunction of lumbar region: Secondary | ICD-10-CM | POA: Diagnosis not present

## 2017-10-06 DIAGNOSIS — M5432 Sciatica, left side: Secondary | ICD-10-CM | POA: Diagnosis not present

## 2017-10-13 DIAGNOSIS — M9903 Segmental and somatic dysfunction of lumbar region: Secondary | ICD-10-CM | POA: Diagnosis not present

## 2017-10-13 DIAGNOSIS — M5432 Sciatica, left side: Secondary | ICD-10-CM | POA: Diagnosis not present

## 2017-10-27 DIAGNOSIS — M5432 Sciatica, left side: Secondary | ICD-10-CM | POA: Diagnosis not present

## 2017-10-27 DIAGNOSIS — M9903 Segmental and somatic dysfunction of lumbar region: Secondary | ICD-10-CM | POA: Diagnosis not present

## 2017-10-29 ENCOUNTER — Encounter: Payer: Self-pay | Admitting: Nurse Practitioner

## 2017-10-29 ENCOUNTER — Ambulatory Visit (INDEPENDENT_AMBULATORY_CARE_PROVIDER_SITE_OTHER): Payer: Medicare Other | Admitting: Nurse Practitioner

## 2017-10-29 VITALS — BP 130/78 | Temp 97.5°F | Ht 72.0 in | Wt 261.4 lb

## 2017-10-29 DIAGNOSIS — J0100 Acute maxillary sinusitis, unspecified: Secondary | ICD-10-CM | POA: Diagnosis not present

## 2017-10-29 MED ORDER — CEFUROXIME AXETIL 500 MG PO TABS: 500.0000 mg | ORAL_TABLET | Freq: Two times a day (BID) | ORAL | 0 refills | Status: DC

## 2017-10-29 MED ORDER — ALBUTEROL SULFATE HFA 108 (90 BASE) MCG/ACT IN AERS: 2.0000 | INHALATION_SPRAY | RESPIRATORY_TRACT | 0 refills | Status: DC | PRN

## 2017-10-29 NOTE — Progress Notes (Signed)
Subjective: Presents with complaints of cough and congestion that began 3 days ago.  Fever has resolved.  Sore throat.  Runny nose.  Occasional spells of coughing.  Had some wheezing initially but this has resolved.  Left ear pain.  Left maxillary area sinus headache.  Has been taking Mucinex D for his symptoms.  Objective:   BP 130/78   Temp (!) 97.5 F (36.4 C) (Oral)   Ht 6' (1.829 m)   Wt 261 lb 6.4 oz (118.6 kg)   BMI 35.45 kg/m  NAD.  Alert, oriented.  Right TM partially obscured with cerumen, no erythema.  Left TM mild clear effusion.  Pharynx injected with PND noted.  Neck supple with mild soft anterior adenopathy.  Lungs clear.  Heart regular rate and rhythm.  Assessment:  Acute non-recurrent maxillary sinusitis    Plan:   Meds ordered this encounter  Medications  . cefUROXime (CEFTIN) 500 MG tablet    Sig: Take 1 tablet (500 mg total) by mouth 2 (two) times daily with a meal.    Dispense:  20 tablet    Refill:  0    Order Specific Question:   Supervising Provider    Answer:   Mikey Kirschner [[id number]]  . albuterol (PROVENTIL HFA;VENTOLIN HFA) 108 (90 Base) MCG/ACT inhaler    Sig: Inhale 2 puffs into the lungs every 4 (four) hours as needed for wheezing or shortness of breath.    Dispense:  1 Inhaler    Refill:  0    HOLD UNLESS PATIENT CALLS FOR THIS    Order Specific Question:   Supervising Provider    Answer:   Mikey Kirschner [[id number]]   Use Mucinex D with caution due to potential side effects such as tachycardia or elevated blood pressure.  Sent in prescription for albuterol to be filled if wheezing returns over the weekend.  Warning signs reviewed.  Call back if worsens or persist.

## 2017-11-09 DIAGNOSIS — H524 Presbyopia: Secondary | ICD-10-CM | POA: Diagnosis not present

## 2017-11-09 DIAGNOSIS — H40013 Open angle with borderline findings, low risk, bilateral: Secondary | ICD-10-CM | POA: Diagnosis not present

## 2017-11-11 ENCOUNTER — Other Ambulatory Visit: Payer: Self-pay | Admitting: Family Medicine

## 2017-11-16 ENCOUNTER — Telehealth: Payer: Self-pay | Admitting: Family Medicine

## 2017-11-16 MED ORDER — AMOXICILLIN-POT CLAVULANATE 875-125 MG PO TABS: 1.0000 | ORAL_TABLET | Freq: Two times a day (BID) | ORAL | 0 refills | Status: DC

## 2017-11-16 NOTE — Telephone Encounter (Signed)
Patient states she was seen by Hoyle Sauer 2 weeks ago and he is no better even after the antibx (ceftin)  she gave him. He states he still has a non productive cough with chest tightness,sinus pressure and no energy.Does he need to be seen again or does he need another round of antibx? Please advise.

## 2017-11-16 NOTE — Telephone Encounter (Signed)
Pt was seen recently and was given an antibiotic by Hoyle Sauer. Pt states that he has finished the antibiotic and is not any better. Can something else be called in. Please advise.   Festus Barren

## 2017-11-16 NOTE — Telephone Encounter (Signed)
Aug 875 bid ten d 

## 2017-11-16 NOTE — Telephone Encounter (Signed)
Patient is aware 

## 2017-11-16 NOTE — Telephone Encounter (Signed)
Sent rx in

## 2017-12-07 ENCOUNTER — Other Ambulatory Visit: Payer: Self-pay | Admitting: Interventional Cardiology

## 2017-12-08 DIAGNOSIS — M9903 Segmental and somatic dysfunction of lumbar region: Secondary | ICD-10-CM | POA: Diagnosis not present

## 2017-12-08 DIAGNOSIS — M5432 Sciatica, left side: Secondary | ICD-10-CM | POA: Diagnosis not present

## 2017-12-15 DIAGNOSIS — M9903 Segmental and somatic dysfunction of lumbar region: Secondary | ICD-10-CM | POA: Diagnosis not present

## 2017-12-15 DIAGNOSIS — M5432 Sciatica, left side: Secondary | ICD-10-CM | POA: Diagnosis not present

## 2017-12-22 DIAGNOSIS — M9903 Segmental and somatic dysfunction of lumbar region: Secondary | ICD-10-CM | POA: Diagnosis not present

## 2017-12-22 DIAGNOSIS — M5432 Sciatica, left side: Secondary | ICD-10-CM | POA: Diagnosis not present

## 2017-12-26 NOTE — Progress Notes (Signed)
Cardiology Office Note    Date:  12/27/2017   ID:  Mitchell Oliver 07/20/1946, MRN [id number]  PCP:  Mikey Kirschner, MD  Cardiologist: Sinclair Grooms, MD   Chief Complaint  Patient presents with  . Coronary Artery Disease    History of Present Illness:  Mitchell Oliver is a 72 y.o. male has a history of CAD with drug-eluting stent implantation in setting of non-ST elevation MI in PDA and diagonal back in 2016. Diagonal stenting was associated with inability to fully expand the drug-eluting stent. Also history of emphysema, hypertension and hyperlipidemia.   He denies chest discomfort.  There is no excessive dyspnea.  He still has lower extremity swelling, bilateral.  There is no orthopnea or PND.  He has not needed nitroglycerin.  Cough, muscle pain,   Past Medical History:  Diagnosis Date  . Allergy   . COPD (chronic obstructive pulmonary disease) ([place])   . CVA (cerebral vascular accident) ([place]) 2012   denies residual on 07/22/2015  . ED (erectile dysfunction)   . Family history of adverse reaction to anesthesia    "think my mother had real bad headaches after anesthesia"  . GERD (gastroesophageal reflux disease)   . Hypercholesterolemia   . Hypertension   . IFG (impaired fasting glucose)   . NSTEMI (non-ST elevated myocardial infarction) ([place])   . Pneumonia ~ 2005    Past Surgical History:  Procedure Laterality Date  . APPENDECTOMY  2009 [facility]  . BACK SURGERY    . BIOPSY  05/04/2014   Procedure: BIOPSY;  Surgeon: Danie Binder, MD;  [facility];  Service: Endoscopy;;  . CARDIAC CATHETERIZATION N/A 07/18/2015   Procedure: Left Heart Cath and Coronary Angiography;  Surgeon: Belva Crome, MD;  Location: [facility];  Service: Cardiovascular;  Laterality: N/A;  . CARDIAC CATHETERIZATION N/A 07/18/2015   Procedure: Coronary Stent Intervention;  Surgeon: Belva Crome, MD;  Location: [facility];  Service: Cardiovascular;  Laterality: N/A;   . CARDIAC CATHETERIZATION N/A 07/22/2015   Procedure: Coronary Stent Intervention;  Surgeon: Belva Crome, MD;  Location: [facility];  Service: Cardiovascular;  Laterality: N/A;  . COLONOSCOPY  2009  . ESOPHAGOGASTRODUODENOSCOPY N/A 05/04/2014   Procedure: ESOPHAGOGASTRODUODENOSCOPY (EGD);  Surgeon: Danie Binder, MD;  [facility];  Service: Endoscopy;  Laterality: N/A;  9:15  . POSTERIOR LAMINECTOMY / DECOMPRESSION LUMBAR SPINE  06/03/2015  . SAVORY DILATION N/A 05/04/2014   Procedure: SAVORY DILATION;  Surgeon: Danie Binder, MD;  [facility]: [facility];  Service: Endoscopy;  Laterality: N/A;    Current Medications: Outpatient Medications Prior to Visit  Medication Sig Dispense Refill  . acetaminophen (TYLENOL) 325 MG tablet Take 2 tablets (650 mg total) by mouth every 4 (four) hours as needed for headache or mild pain.    Marland Kitchen albuterol (PROVENTIL HFA;VENTOLIN HFA) 108 (90 Base) MCG/ACT inhaler Inhale 2 puffs into the lungs every 4 (four) hours as needed for wheezing or shortness of breath. 1 Inhaler 0  . amLODipine (NORVASC) 10 MG tablet TAKE 1 TABLET(10 MG) BY MOUTH DAILY 30 tablet 5  . amLODipine (NORVASC) 10 MG tablet TAKE 1 TABLET BY MOUTH DAILY 30 tablet 0  . amoxicillin-clavulanate (AUGMENTIN) 875-125 MG tablet Take 1 tablet by mouth 2 (two) times daily. 20 tablet 0  . Ascorbic Acid (VITAMIN C PO) Take 1 tablet by mouth daily.     Marland Kitchen aspirin EC 81 MG  tablet Take 81 mg by mouth daily.    Marland Kitchen atorvastatin (LIPITOR) 80 MG tablet Take 1 tablet (80 mg total) by mouth daily. 30 tablet 5  . benazepril (LOTENSIN) 20 MG tablet Take 1 tablet (20 mg total) by mouth daily. 30 tablet 5  . cefUROXime (CEFTIN) 500 MG tablet Take 1 tablet (500 mg total) by mouth 2 (two) times daily with a meal. 20 tablet 0  . chlorzoxazone (PARAFON FORTE DSC) 500 MG tablet Take 1 tablet (500 mg total) by mouth 3 (three) times daily as needed for muscle spasms. 24 tablet 0  . CIALIS 20 MG tablet TAKE  1 TABLET BY MOUTH DAILY IF NEEDED FOR ERECTILE DYSFUNCTION, TAKE 2 HOURS TO INTERCOURSE 6 tablet 0  . clopidogrel (PLAVIX) 75 MG tablet TAKE 1 TABLET BY MOUTH DAILY 90 tablet 0  . Cyanocobalamin (VITAMIN B-12 CR) 1000 MCG TBCR Take 1 tablet by mouth daily.    . fish oil-omega-3 fatty acids 1000 MG capsule Take 2 g by mouth daily.    . fluticasone (FLONASE) 50 MCG/ACT nasal spray Place 1 spray into both nostrils daily as needed for allergies or rhinitis.     Marland Kitchen latanoprost (XALATAN) 0.005 % ophthalmic solution Place 1 drop into both eyes at bedtime.     . metoprolol succinate (TOPROL-XL) 50 MG 24 hr tablet TAKE 1 TABLET BY MOUTH DAILY WITH OR IMMEDIATELY FOLLOWING A MEAL 90 tablet 1  . metoprolol succinate (TOPROL-XL) 50 MG 24 hr tablet TAKE 1 TABLET BY MOUTH EVERY DAY 90 tablet 0  . Multiple Vitamins-Minerals (PRESERVISION AREDS PO) Take by mouth 2 (two) times daily.    . nitroGLYCERIN (NITROSTAT) 0.4 MG SL tablet Place 1 tablet (0.4 mg total) under the tongue every 5 (five) minutes as needed for chest pain (maxium 3 tablets daily). Do not take within 36 hrs of Cialis use 25 tablet 2  . pantoprazole (PROTONIX) 40 MG tablet Take 1 tablet (40 mg total) by mouth every morning. 30 tablet 5  . Zinc Acetate 25 MG CAPS Take 50 mg by mouth daily.     No facility-administered medications prior to visit.      Allergies:   Patient has no known allergies.   Social History   Socioeconomic History  . Marital status: Divorced    Spouse name: None  . Number of children: None  . Years of education: None  . Highest education level: None  Social Needs  . Financial resource strain: None  . Food insecurity - worry: None  . Food insecurity - inability: None  . Transportation needs - medical: None  . Transportation needs - non-medical: None  Occupational History  . None  Tobacco Use  . Smoking status: Former Smoker    Packs/day: 2.00    Years: 24.00    Pack years: 48.00    Types: Cigarettes    Last  attempt to quit: 12/24/1985    Years since quitting: 32.0  . Smokeless tobacco: Never Used  Substance and Sexual Activity  . Alcohol use: Yes    Alcohol/week: 4.2 oz    Types: 7 Cans of beer per week  . Drug use: No  . Sexual activity: Yes  Other Topics Concern  . None  Social History Narrative  . None     Family History:  The patient's family history includes Heart attack in his father; Hypertension in his father and mother; Stroke in his father.   ROS:   Please see the history of present illness.  And knee discomfort.  Otherwise no complaints. All other systems reviewed and are negative.   PHYSICAL EXAM:   VS:  BP (!) 142/78   Pulse 61   Ht 6\' 2"  (1.88 m)   Wt 259 lb (117.5 kg)   BMI 33.25 kg/m    GEN: Well nourished, well developed, in no acute distress  HEENT: normal  Neck: no JVD, carotid bruits, or masses Cardiac:   RRR; no murmurs, rubs, or gallops,no edema  Respiratory:  clear to auscultation bilaterally, normal work of breathing GI: soft, nontender, nondistended, + BS MS: no deformity or atrophy  Skin: warm and dry, no rash Neuro:  Alert and Oriented x 3, Strength and sensation are intact Psych: euthymic mood, full affect  Wt Readings from Last 3 Encounters:  12/27/17 259 lb (117.5 kg)  10/29/17 261 lb 6.4 oz (118.6 kg)  07/15/17 262 lb (118.8 kg)      Studies/Labs Reviewed:   EKG:  EKG  Normal sinus rhythm with overall normal appearance the EKG is no change when compared to the prior tracing.  Recent Labs: 07/15/2017: ALT 41; BUN 7; Creatinine, Ser 1.20; Potassium 5.4; Sodium 133   Lipid Panel    Component Value Date/Time   CHOL 112 07/15/2017 0950   TRIG 95 07/15/2017 0950   HDL 41 07/15/2017 0950   CHOLHDL 2.7 07/15/2017 0950   CHOLHDL 2.9 07/03/2014 0814   VLDL 21 07/03/2014 0814   LDLCALC 52 07/15/2017 0950    Additional studies/ records that were reviewed today include:  None    ASSESSMENT:    1. CAD S/P percutaneous coronary  angioplasty   2. Essential hypertension, benign   3. Other emphysema ([place])   4. Dyslipidemia      PLAN:  In order of problems listed above:  1. Denies angina.  We did a risk benefit analysis of dual antiplatelet therapy and have decided to continue on aspirin and Plavix.  He will monitor for bleeding.  He will call if any issues. 2. Blood pressure is borderline.  Target is 140/90 mmHg and ideally 130/80 mmHg.  Consider decreasing amlodipine to 5 mg/day (will help relieve bilateral lower extremity edema) and add HCTZ (12.5 mg) to Lotensin.   3. Not addressed 4. LDL less than 70, was 52 when checked in September.  Plan to continue dual antiplatelet therapy.  Clinical follow-up in 1 year.  Consider alteration in antihypertensive regimen to relieve lower extremity edema by decreasing amlodipine to 5 mg/day and adding HCTZ to Lotensin-benazepril HCTZ 20/12.5 mg.  We will leave this to Dr. [name].    Medication Adjustments/Labs and Tests Ordered: Current medicines are reviewed at length with the patient today.  Concerns regarding medicines are outlined above.  Medication changes, Labs and Tests ordered today are listed in the Patient Instructions below. Patient Instructions  Medication Instructions:  Your physician recommends that you continue on your current medications as directed. Please refer to the Current Medication list given to you today.  Labwork: None  Testing/Procedures: None  Follow-Up: Your physician wants you to follow-up in: 1 year with Dr. Tamala Julian.  You will receive a reminder letter in the mail two months in advance. If you don't receive a letter, please call our office to schedule the follow-up appointment.   Any Other Special Instructions Will Be Listed Below (If Applicable).     If you need a refill on your cardiac medications before your next appointment, please call your pharmacy.      Signed, Mallie Mussel  Carlye Grippe, MD  12/27/2017 8:28 AM    [facility] [address], [place], [place]  [address] Phone: [phone]; Fax: [phone]

## 2017-12-27 ENCOUNTER — Encounter: Payer: Self-pay | Admitting: Interventional Cardiology

## 2017-12-27 ENCOUNTER — Ambulatory Visit: Payer: Medicare Other | Admitting: Interventional Cardiology

## 2017-12-27 VITALS — BP 142/78 | HR 61 | Ht 74.0 in | Wt 259.0 lb

## 2017-12-27 DIAGNOSIS — Z9861 Coronary angioplasty status: Secondary | ICD-10-CM

## 2017-12-27 DIAGNOSIS — J438 Other emphysema: Secondary | ICD-10-CM

## 2017-12-27 DIAGNOSIS — I2510 Atherosclerotic heart disease of native coronary artery without angina pectoris: Secondary | ICD-10-CM

## 2017-12-27 DIAGNOSIS — I10 Essential (primary) hypertension: Secondary | ICD-10-CM

## 2017-12-27 DIAGNOSIS — E785 Hyperlipidemia, unspecified: Secondary | ICD-10-CM

## 2017-12-27 NOTE — Patient Instructions (Signed)

## 2017-12-30 DIAGNOSIS — M5432 Sciatica, left side: Secondary | ICD-10-CM | POA: Diagnosis not present

## 2017-12-30 DIAGNOSIS — M9903 Segmental and somatic dysfunction of lumbar region: Secondary | ICD-10-CM | POA: Diagnosis not present

## 2018-01-05 DIAGNOSIS — M5432 Sciatica, left side: Secondary | ICD-10-CM | POA: Diagnosis not present

## 2018-01-05 DIAGNOSIS — M9903 Segmental and somatic dysfunction of lumbar region: Secondary | ICD-10-CM | POA: Diagnosis not present

## 2018-01-12 ENCOUNTER — Ambulatory Visit: Payer: Medicare Other | Admitting: Family Medicine

## 2018-01-13 ENCOUNTER — Ambulatory Visit (INDEPENDENT_AMBULATORY_CARE_PROVIDER_SITE_OTHER): Payer: Medicare Other | Admitting: Family Medicine

## 2018-01-13 ENCOUNTER — Encounter: Payer: Self-pay | Admitting: Family Medicine

## 2018-01-13 VITALS — BP 138/80 | Ht 72.0 in | Wt 258.0 lb

## 2018-01-13 DIAGNOSIS — I10 Essential (primary) hypertension: Secondary | ICD-10-CM

## 2018-01-13 DIAGNOSIS — Z13220 Encounter for screening for lipoid disorders: Secondary | ICD-10-CM

## 2018-01-13 DIAGNOSIS — Z79899 Other long term (current) drug therapy: Secondary | ICD-10-CM

## 2018-01-13 DIAGNOSIS — Z8673 Personal history of transient ischemic attack (TIA), and cerebral infarction without residual deficits: Secondary | ICD-10-CM

## 2018-01-13 DIAGNOSIS — R7303 Prediabetes: Secondary | ICD-10-CM | POA: Diagnosis not present

## 2018-01-13 DIAGNOSIS — E785 Hyperlipidemia, unspecified: Secondary | ICD-10-CM

## 2018-01-13 MED ORDER — METOPROLOL SUCCINATE ER 50 MG PO TB24: 50.0000 mg | ORAL_TABLET | Freq: Every day | ORAL | 1 refills | Status: DC

## 2018-01-13 MED ORDER — HYDROCHLOROTHIAZIDE 25 MG PO TABS: ORAL_TABLET | ORAL | 1 refills | Status: DC

## 2018-01-13 MED ORDER — AMLODIPINE BESYLATE 5 MG PO TABS: 5.0000 mg | ORAL_TABLET | Freq: Every day | ORAL | 1 refills | Status: DC

## 2018-01-13 MED ORDER — BENAZEPRIL HCL 20 MG PO TABS: 20.0000 mg | ORAL_TABLET | Freq: Every day | ORAL | 5 refills | Status: DC

## 2018-01-13 MED ORDER — PANTOPRAZOLE SODIUM 40 MG PO TBEC: 40.0000 mg | DELAYED_RELEASE_TABLET | Freq: Every morning | ORAL | 5 refills | Status: DC

## 2018-01-13 NOTE — Progress Notes (Signed)
   Subjective:    Patient ID: Mitchell Oliver, male    DOB: 04/29/1946, 72 y.o.   MRN: [id number]  HPI Patient is here today to follow up on Chronic health issues.He is here to follow up on Htn he is on amlodipine 10 mg one daily,Lotensin 20 mg one daily, metoprolol 50 mg one daily. He states he eats healthy and does see a Cardiologist Dr. Tamala Julian. Blood pressure medicine and blood pressure levels reviewed today with patient. Compliant with blood pressure medicine. States does not miss a dose. No obvious side effects. Blood pressure generally good when checked elsewhere. Watching salt intake.   Patient continues to take lipid medication regularly. No obvious side effects from it. Generally does not miss a dose. Prior blood work results are reviewed with patient. Patient continues to work on fat intake in diet  Working on exercise   Trying to lose weigh t  Patient concerned about swelling in legs.  Usually worse after long day.  Right greater than left.  Patient's cardiologist told him he could back off on Norvasc again some benefit.  No further symptoms of heart disease.  No TIAs    Review of Systems No headache, no major weight loss or weight gain, no chest pain no back pain abdominal pain no change in bowel habits complete ROS otherwise negative     Objective:   Physical Exam Alert and oriented, vitals reviewed and stable, NAD ENT-TM's and ext canals WNL bilat via otoscopic exam Soft palate, tonsils and post pharynx WNL via oropharyngeal exam Neck-symmetric, no masses; thyroid nonpalpable and nontender Pulmonary-no tachypnea or accessory muscle use; Clear without wheezes via auscultation Card--no abnrml murmurs, rhythm reg and rate WNL Carotid pulses symmetric, without bruits        Assessment & Plan:  Impression 1 hypertension good control discussed with unsatisfactory side effects not Norvasc down to 5 mg.  Add hydrochlorothiazide 25.  Treatment 7 1 month rationale  discussed  2.  Leg edema secondary to Norvasc discussed  3.  Hyperlipidemia status uncertain discussed we will check blood work  4.  Prediabetes status uncertain we will check  5.  History of stroke clinically stable no TIAs  For history of coronary artery disease clinically stable symptom care discussed  Greater than 50% of this 25 minute face to face visit was spent in counseling and discussion and coordination of care regarding the above diagnosis/diagnosies

## 2018-02-08 ENCOUNTER — Other Ambulatory Visit: Payer: Self-pay | Admitting: Family Medicine

## 2018-02-08 DIAGNOSIS — H40053 Ocular hypertension, bilateral: Secondary | ICD-10-CM | POA: Diagnosis not present

## 2018-02-10 DIAGNOSIS — I10 Essential (primary) hypertension: Secondary | ICD-10-CM | POA: Diagnosis not present

## 2018-02-10 DIAGNOSIS — Z13220 Encounter for screening for lipoid disorders: Secondary | ICD-10-CM | POA: Diagnosis not present

## 2018-02-10 DIAGNOSIS — Z79899 Other long term (current) drug therapy: Secondary | ICD-10-CM | POA: Diagnosis not present

## 2018-02-11 LAB — BASIC METABOLIC PANEL
BUN/Creatinine Ratio: 8 — ABNORMAL LOW (ref 10–24)
BUN: 9 mg/dL (ref 8–27)
CALCIUM: 9.7 mg/dL (ref 8.6–10.2)
CO2: 20 mmol/L (ref 20–29)
Chloride: 98 mmol/L (ref 96–106)
Creatinine, Ser: 1.2 mg/dL (ref 0.76–1.27)
GFR calc Af Amer: 69 mL/min/{1.73_m2} (ref >59)
GFR, EST NON AFRICAN AMERICAN: 60 mL/min/{1.73_m2} (ref >59)
Glucose: 110 mg/dL — ABNORMAL HIGH (ref 65–99)
POTASSIUM: 3.8 mmol/L (ref 3.5–5.2)
Sodium: 133 mmol/L — ABNORMAL LOW (ref 134–144)

## 2018-02-11 LAB — LIPID PANEL
CHOL/HDL RATIO: 2.8 ratio (ref 0.0–5.0)
Cholesterol, Total: 105 mg/dL (ref 100–199)
HDL: 37 mg/dL — AB (ref >39)
LDL Calculated: 52 mg/dL (ref 0–99)
TRIGLYCERIDES: 80 mg/dL (ref 0–149)
VLDL Cholesterol Cal: 16 mg/dL (ref 5–40)

## 2018-02-11 LAB — HEPATIC FUNCTION PANEL
ALT: 35 IU/L (ref 0–44)
AST: 33 IU/L (ref 0–40)
Albumin: 4.2 g/dL (ref 3.5–4.8)
Alkaline Phosphatase: 57 IU/L (ref 39–117)
BILIRUBIN, DIRECT: 0.38 mg/dL (ref 0.00–0.40)
Bilirubin Total: 1.3 mg/dL — ABNORMAL HIGH (ref 0.0–1.2)
Total Protein: 7.3 g/dL (ref 6.0–8.5)

## 2018-02-14 ENCOUNTER — Encounter: Payer: Self-pay | Admitting: Family Medicine

## 2018-02-14 ENCOUNTER — Ambulatory Visit (INDEPENDENT_AMBULATORY_CARE_PROVIDER_SITE_OTHER): Payer: Medicare Other | Admitting: Family Medicine

## 2018-02-14 VITALS — BP 130/82 | Ht 74.0 in | Wt 257.6 lb

## 2018-02-14 DIAGNOSIS — I10 Essential (primary) hypertension: Secondary | ICD-10-CM | POA: Diagnosis not present

## 2018-02-14 MED ORDER — ATORVASTATIN CALCIUM 80 MG PO TABS: 80.0000 mg | ORAL_TABLET | Freq: Every day | ORAL | 4 refills | Status: DC

## 2018-02-14 MED ORDER — POTASSIUM CHLORIDE ER 20 MEQ PO TBCR: EXTENDED_RELEASE_TABLET | ORAL | 4 refills | Status: DC

## 2018-02-14 MED ORDER — BENAZEPRIL HCL 20 MG PO TABS: 20.0000 mg | ORAL_TABLET | Freq: Every day | ORAL | 4 refills | Status: DC

## 2018-02-14 MED ORDER — HYDROCHLOROTHIAZIDE 25 MG PO TABS: ORAL_TABLET | ORAL | 0 refills | Status: DC

## 2018-02-14 MED ORDER — AMLODIPINE BESYLATE 5 MG PO TABS: 5.0000 mg | ORAL_TABLET | Freq: Every day | ORAL | 0 refills | Status: DC

## 2018-02-14 MED ORDER — PANTOPRAZOLE SODIUM 40 MG PO TBEC: 40.0000 mg | DELAYED_RELEASE_TABLET | Freq: Every morning | ORAL | 4 refills | Status: DC

## 2018-02-14 MED ORDER — METOPROLOL SUCCINATE ER 50 MG PO TB24: 50.0000 mg | ORAL_TABLET | Freq: Every day | ORAL | 0 refills | Status: DC

## 2018-02-14 NOTE — Progress Notes (Signed)
   Subjective:    Patient ID: Mitchell Oliver, male    DOB: Oct 08, 1946, 72 y.o.   MRN: [id number]  Hypertension  This is a chronic problem. The current episode started more than 1 year ago. Risk factors for coronary artery disease include dyslipidemia and male gender. Treatments tried: norvasc, hctz. There are no compliance problems.     Follow up after decreasing Norvasc and adding HCTZ Patient also had recent blood work  Results for orders placed or performed in visit on 78/67/67  Basic metabolic panel  Result Value Ref Range   Glucose 110 (H) 65 - 99 mg/dL   BUN 9 8 - 27 mg/dL   Creatinine, Ser 1.20 0.76 - 1.27 mg/dL   GFR calc non Af Amer 60 >59 mL/min/1.73   GFR calc Af Amer 69 >59 mL/min/1.73   BUN/Creatinine Ratio 8 (L) 10 - 24   Sodium 133 (L) 134 - 144 mmol/L   Potassium 3.8 3.5 - 5.2 mmol/L   Chloride 98 96 - 106 mmol/L   CO2 20 20 - 29 mmol/L   Calcium 9.7 8.6 - 10.2 mg/dL  Lipid panel  Result Value Ref Range   Cholesterol, Total 105 100 - 199 mg/dL   Triglycerides 80 0 - 149 mg/dL   HDL 37 (L) >39 mg/dL   VLDL Cholesterol Cal 16 5 - 40 mg/dL   LDL Calculated 52 0 - 99 mg/dL   Chol/HDL Ratio 2.8 0.0 - 5.0 ratio  Hepatic function panel  Result Value Ref Range   Total Protein 7.3 6.0 - 8.5 g/dL   Albumin 4.2 3.5 - 4.8 g/dL   Bilirubin Total 1.3 (H) 0.0 - 1.2 mg/dL   Bilirubin, Direct 0.38 0.00 - 0.40 mg/dL   Alkaline Phosphatase 57 39 - 117 IU/L   AST 33 0 - 40 IU/L   ALT 35 0 - 44 IU/L   Blood pressure medicine and blood pressure levels reviewed today with patient. Compliant with blood pressure medicine. States does not miss a dose. No obvious side effects. Blood pressure generally good when checked elsewhere. Watching salt intake.   Review of Systems No headache, no major weight loss or weight gain, no chest pain no back pain abdominal pain no change in bowel habits complete ROS otherwise negative     Objective:   Physical Exam  Alert vitals stable, NAD.  Blood pressure good on repeat. HEENT normal. Lungs clear. Heart regular rate and rhythm.       Assessment & Plan:  1 impression hypertension.  Controlled now improved.  Less swelling in ankles.  Potassium borderline low so we will add potassium.  Rationale discussed with patient.  Follow-up in 70-month visit diet exercise discussed/wellness chronic visit 10

## 2018-02-17 DIAGNOSIS — M5432 Sciatica, left side: Secondary | ICD-10-CM | POA: Diagnosis not present

## 2018-02-17 DIAGNOSIS — M9903 Segmental and somatic dysfunction of lumbar region: Secondary | ICD-10-CM | POA: Diagnosis not present

## 2018-02-20 ENCOUNTER — Encounter: Payer: Self-pay | Admitting: Family Medicine

## 2018-03-04 ENCOUNTER — Other Ambulatory Visit: Payer: Self-pay | Admitting: Interventional Cardiology

## 2018-04-05 ENCOUNTER — Other Ambulatory Visit: Payer: Self-pay | Admitting: Family Medicine

## 2018-05-03 DIAGNOSIS — H40053 Ocular hypertension, bilateral: Secondary | ICD-10-CM | POA: Diagnosis not present

## 2018-05-04 DIAGNOSIS — L820 Inflamed seborrheic keratosis: Secondary | ICD-10-CM | POA: Diagnosis not present

## 2018-05-04 DIAGNOSIS — D225 Melanocytic nevi of trunk: Secondary | ICD-10-CM | POA: Diagnosis not present

## 2018-05-04 DIAGNOSIS — L570 Actinic keratosis: Secondary | ICD-10-CM | POA: Diagnosis not present

## 2018-05-04 DIAGNOSIS — X32XXXD Exposure to sunlight, subsequent encounter: Secondary | ICD-10-CM | POA: Diagnosis not present

## 2018-06-06 DIAGNOSIS — M5432 Sciatica, left side: Secondary | ICD-10-CM | POA: Diagnosis not present

## 2018-06-06 DIAGNOSIS — M9903 Segmental and somatic dysfunction of lumbar region: Secondary | ICD-10-CM | POA: Diagnosis not present

## 2018-06-08 ENCOUNTER — Telehealth: Payer: Self-pay | Admitting: Interventional Cardiology

## 2018-06-08 NOTE — Telephone Encounter (Signed)
Pt would like to start using CBD oil for back and knee pain.  Wanted to know if ok to use with cardiac medications.  Advised I would send message to Dr. Tamala Julian and our PharmD to see if any possible interactions.  Pt appreciative for call.

## 2018-06-08 NOTE — Telephone Encounter (Signed)
Spoke with pt and made him aware.  Pt appreciative for call.  

## 2018-06-08 NOTE — Telephone Encounter (Signed)
New Message      Pt c/o medication issue:  1. Name of Medication: Plavix  2. How are you currently taking this medication (dosage and times per day)? Once a day  3. Are you having a reaction (difficulty breathing--STAT)? No   4. What is your medication issue? Patient wants to take something along plavix, pls advise.Wants to make sure it's not a drug to drug reaction. Didn't want to disclose the name of other Rx.

## 2018-06-08 NOTE — Telephone Encounter (Signed)
Pt can use topical CBD oil but would not recommend any oral CBD products. They can decrease the efficacy of his Plavix.

## 2018-07-08 ENCOUNTER — Other Ambulatory Visit: Payer: Self-pay | Admitting: Family Medicine

## 2018-07-09 ENCOUNTER — Other Ambulatory Visit: Payer: Self-pay | Admitting: Family Medicine

## 2018-07-12 DIAGNOSIS — M5432 Sciatica, left side: Secondary | ICD-10-CM | POA: Diagnosis not present

## 2018-07-12 DIAGNOSIS — M9903 Segmental and somatic dysfunction of lumbar region: Secondary | ICD-10-CM | POA: Diagnosis not present

## 2018-07-19 ENCOUNTER — Encounter: Payer: Self-pay | Admitting: Family Medicine

## 2018-07-19 ENCOUNTER — Ambulatory Visit (INDEPENDENT_AMBULATORY_CARE_PROVIDER_SITE_OTHER): Payer: Medicare Other | Admitting: Family Medicine

## 2018-07-19 VITALS — BP 132/76 | Ht 72.0 in | Wt 255.0 lb

## 2018-07-19 DIAGNOSIS — Z23 Encounter for immunization: Secondary | ICD-10-CM

## 2018-07-19 DIAGNOSIS — Z125 Encounter for screening for malignant neoplasm of prostate: Secondary | ICD-10-CM | POA: Diagnosis not present

## 2018-07-19 DIAGNOSIS — Z0000 Encounter for general adult medical examination without abnormal findings: Secondary | ICD-10-CM

## 2018-07-19 DIAGNOSIS — Z79899 Other long term (current) drug therapy: Secondary | ICD-10-CM | POA: Diagnosis not present

## 2018-07-19 DIAGNOSIS — I10 Essential (primary) hypertension: Secondary | ICD-10-CM

## 2018-07-19 DIAGNOSIS — E785 Hyperlipidemia, unspecified: Secondary | ICD-10-CM

## 2018-07-19 MED ORDER — ATORVASTATIN CALCIUM 80 MG PO TABS: 80.0000 mg | ORAL_TABLET | Freq: Every day | ORAL | 1 refills | Status: DC

## 2018-07-19 MED ORDER — BENAZEPRIL HCL 20 MG PO TABS: ORAL_TABLET | ORAL | 1 refills | Status: DC

## 2018-07-19 MED ORDER — AMLODIPINE BESYLATE 5 MG PO TABS: ORAL_TABLET | ORAL | 1 refills | Status: DC

## 2018-07-19 MED ORDER — HYDROCHLOROTHIAZIDE 25 MG PO TABS: ORAL_TABLET | ORAL | 1 refills | Status: DC

## 2018-07-19 MED ORDER — PANTOPRAZOLE SODIUM 40 MG PO TBEC: DELAYED_RELEASE_TABLET | ORAL | 1 refills | Status: DC

## 2018-07-19 MED ORDER — METOPROLOL SUCCINATE ER 50 MG PO TB24: 50.0000 mg | ORAL_TABLET | Freq: Every day | ORAL | 1 refills | Status: DC

## 2018-07-19 MED ORDER — POTASSIUM CHLORIDE CRYS ER 20 MEQ PO TBCR: EXTENDED_RELEASE_TABLET | ORAL | 1 refills | Status: DC

## 2018-07-19 NOTE — Progress Notes (Signed)
Subjective:    Patient ID: Mitchell Oliver, male    DOB: 24-Jan-1946, 72 y.o.   MRN: [id number]  HPI AWV- Annual Wellness Visit  The patient was seen for their annual wellness visit. The patient's past medical history, surgical history, and family history were reviewed. Pertinent vaccines were reviewed ( tetanus, pneumonia, shingles, flu) The patient's medication list was reviewed and updated.  The height and weight were entered.  BMI recorded in electronic record elsewhere  Cognitive screening was completed. Outcome of Mini - Cog: pass   Falls /depression screening electronically recorded within record elsewhere  Current tobacco usage: none (All patients who use tobacco were given written and verbal information on quitting)  Recent listing of emergency department/hospitalizations over the past year were reviewed.  current specialist the patient sees on a regular basis: Dr. Tommas Olp    Medicare annual wellness visit patient questionnaire was reviewed.  A written screening schedule for the patient for the next 5-10 years was given. Appropriate discussion of followup regarding next visit was discussed.  Blood pressure medicine and blood pressure levels reviewed today with patient. Compliant with blood pressure medicine. States does not miss a dose. No obvious side effects. Blood pressure generally good when checked elsewhere. Watching salt intake.   Patient continues to take lipid medication regularly. No obvious side effects from it. Generally does not miss a dose. Prior blood work results are reviewed with patient. Patient continues to work on fat intake in diet   Results for orders placed or performed in visit on 29/56/21  Basic metabolic panel  Result Value Ref Range   Glucose 110 (H) 65 - 99 mg/dL   BUN 9 8 - 27 mg/dL   Creatinine, Ser 1.20 0.76 - 1.27 mg/dL   GFR calc non Af Amer 60 >59 mL/min/1.73   GFR calc Af Amer 69 >59 mL/min/1.73   BUN/Creatinine Ratio 8 (L) 10  - 24   Sodium 133 (L) 134 - 144 mmol/L   Potassium 3.8 3.5 - 5.2 mmol/L   Chloride 98 96 - 106 mmol/L   CO2 20 20 - 29 mmol/L   Calcium 9.7 8.6 - 10.2 mg/dL  Lipid panel  Result Value Ref Range   Cholesterol, Total 105 100 - 199 mg/dL   Triglycerides 80 0 - 149 mg/dL   HDL 37 (L) >39 mg/dL   VLDL Cholesterol Cal 16 5 - 40 mg/dL   LDL Calculated 52 0 - 99 mg/dL   Chol/HDL Ratio 2.8 0.0 - 5.0 ratio  Hepatic function panel  Result Value Ref Range   Total Protein 7.3 6.0 - 8.5 g/dL   Albumin 4.2 3.5 - 4.8 g/dL   Bilirubin Total 1.3 (H) 0.0 - 1.2 mg/dL   Bilirubin, Direct 0.38 0.00 - 0.40 mg/dL   Alkaline Phosphatase 57 39 - 117 IU/L   AST 33 0 - 40 IU/L   ALT 35 0 - 44 IU/L     Known d cad ,  Pos hx of stroke   Had clolon in [place], next one u one or two  Years    Review of Systems  Constitutional: Negative for activity change, appetite change and fever.  HENT: Negative for congestion and rhinorrhea.   Eyes: Negative for discharge.  Respiratory: Negative for cough and wheezing.   Cardiovascular: Negative for chest pain.  Gastrointestinal: Negative for abdominal pain, blood in stool and vomiting.  Genitourinary: Negative for difficulty urinating and frequency.  Musculoskeletal: Negative for neck pain.  Skin: Negative  for rash.  Allergic/Immunologic: Negative for environmental allergies and food allergies.  Neurological: Negative for weakness and headaches.  Psychiatric/Behavioral: Negative for agitation.  All other systems reviewed and are negative.      Objective:   Physical Exam  Constitutional: He appears well-developed and well-nourished.  HENT:  Head: Normocephalic and atraumatic.  Right Ear: External ear normal.  Left Ear: External ear normal.  Nose: Nose normal.  Mouth/Throat: Oropharynx is clear and moist.  Eyes: Pupils are equal, round, and reactive to light. EOM are normal.  Neck: Normal range of motion. Neck supple. No thyromegaly present.    Cardiovascular: Normal rate, regular rhythm and normal heart sounds.  No murmur heard. Pulmonary/Chest: Effort normal and breath sounds normal. No respiratory distress. He has no wheezes.  Abdominal: Soft. Bowel sounds are normal. He exhibits no distension and no mass. There is no tenderness.  Genitourinary: Penis normal.  Genitourinary Comments: Prostate exam within normal limits  Musculoskeletal: Normal range of motion. He exhibits no edema.  Lymphadenopathy:    He has no cervical adenopathy.  Neurological: He is alert. He exhibits normal muscle tone.  Skin: Skin is warm and dry. No erythema.  Psychiatric: He has a normal mood and affect. His behavior is normal. Judgment normal.          Assessment & Plan:  Impression wellness exam.  Shot today.  Diet discussed.  Exercise discussed.  Shingrix vaccine already given.  Patient to call [facility] to see when his next colonoscopy is due  2.  Hypertension.  Good control discussed maintain same meds #3 hyperlipidemia prior blood work reviewed discussed maintain same pending current blood work her graph #4 coronary artery disease clinically stable  5.  Status post stroke clinically stable  Medications refilled diet exercise discussed flu shot today

## 2018-07-19 NOTE — Patient Instructions (Signed)
Consider TdaP

## 2018-07-20 LAB — BASIC METABOLIC PANEL
BUN / CREAT RATIO: 9 — AB (ref 10–24)
BUN: 8 mg/dL (ref 8–27)
CALCIUM: 10 mg/dL (ref 8.6–10.2)
CHLORIDE: 96 mmol/L (ref 96–106)
CO2: 23 mmol/L (ref 20–29)
CREATININE: 0.94 mg/dL (ref 0.76–1.27)
GFR calc Af Amer: 92 mL/min/{1.73_m2} (ref >59)
GFR calc non Af Amer: 80 mL/min/{1.73_m2} (ref >59)
GLUCOSE: 126 mg/dL — AB (ref 65–99)
Potassium: 5 mmol/L (ref 3.5–5.2)
Sodium: 135 mmol/L (ref 134–144)

## 2018-07-20 LAB — LIPID PANEL
CHOL/HDL RATIO: 2.8 ratio (ref 0.0–5.0)
Cholesterol, Total: 115 mg/dL (ref 100–199)
HDL: 41 mg/dL (ref >39)
LDL CALC: 52 mg/dL (ref 0–99)
TRIGLYCERIDES: 109 mg/dL (ref 0–149)
VLDL CHOLESTEROL CAL: 22 mg/dL (ref 5–40)

## 2018-07-20 LAB — HEPATIC FUNCTION PANEL
ALT: 37 IU/L (ref 0–44)
AST: 33 IU/L (ref 0–40)
Albumin: 4.5 g/dL (ref 3.5–4.8)
Alkaline Phosphatase: 54 IU/L (ref 39–117)
BILIRUBIN, DIRECT: 0.36 mg/dL (ref 0.00–0.40)
Bilirubin Total: 1.3 mg/dL — ABNORMAL HIGH (ref 0.0–1.2)
TOTAL PROTEIN: 7.7 g/dL (ref 6.0–8.5)

## 2018-07-20 LAB — PSA: Prostate Specific Ag, Serum: 0.7 ng/mL (ref 0.0–4.0)

## 2018-07-25 ENCOUNTER — Other Ambulatory Visit: Payer: Self-pay | Admitting: *Deleted

## 2018-07-26 LAB — SPECIMEN STATUS REPORT

## 2018-07-29 DIAGNOSIS — H40053 Ocular hypertension, bilateral: Secondary | ICD-10-CM | POA: Diagnosis not present

## 2018-08-29 ENCOUNTER — Encounter: Payer: Self-pay | Admitting: Family Medicine

## 2018-08-29 ENCOUNTER — Ambulatory Visit (INDEPENDENT_AMBULATORY_CARE_PROVIDER_SITE_OTHER): Payer: Medicare Other | Admitting: Family Medicine

## 2018-08-29 VITALS — BP 128/80 | Ht 72.0 in | Wt 253.0 lb

## 2018-08-29 DIAGNOSIS — R7303 Prediabetes: Secondary | ICD-10-CM

## 2018-08-29 DIAGNOSIS — R7301 Impaired fasting glucose: Secondary | ICD-10-CM

## 2018-08-29 LAB — POCT GLYCOSYLATED HEMOGLOBIN (HGB A1C): HEMOGLOBIN A1C: 5.8 % — AB (ref 4.0–5.6)

## 2018-08-29 NOTE — Patient Instructions (Signed)
Preventing Type 2 Diabetes Mellitus Type 2 diabetes (type 2 diabetes mellitus) is a long-term (chronic) disease that affects blood sugar (glucose) levels. Normally, a hormone called insulin allows glucose to enter cells in the body. The cells use glucose for energy. In type 2 diabetes, one or both of these problems may be present:  The body does not make enough insulin.  The body does not respond properly to insulin that it makes (insulin resistance).  Insulin resistance or lack of insulin causes excess glucose to build up in the blood instead of going into cells. As a result, high blood glucose (hyperglycemia) develops, which can cause many complications. Being overweight or obese and having an inactive (sedentary) lifestyle can increase your risk for diabetes. Type 2 diabetes can be delayed or prevented by making certain nutrition and lifestyle changes. What nutrition changes can be made?  Eat healthy meals and snacks regularly. Keep a healthy snack with you for when you get hungry between meals, such as fruit or a handful of nuts.  Eat lean meats and proteins that are low in saturated fats, such as chicken, fish, egg whites, and beans. Avoid processed meats.  Eat plenty of fruits and vegetables and plenty of grains that have not been processed (whole grains). It is recommended that you eat: ? 1?2 cups of fruit every day. ? 2?3 cups of vegetables every day. ? 6?8 oz of whole grains every day, such as oats, whole wheat, bulgur, brown rice, quinoa, and millet.  Eat low-fat dairy products, such as milk, yogurt, and cheese.  Eat foods that contain healthy fats, such as nuts, avocado, olive oil, and canola oil.  Drink water throughout the day. Avoid drinks that contain added sugar, such as soda or sweet tea.  Follow instructions from your health care provider about specific eating or drinking restrictions.  Control how much food you eat at a time (portion size). ? Check food labels to find  out the serving sizes of foods. ? Use a kitchen scale to weigh amounts of foods.  Saute or steam food instead of frying it. Cook with water or broth instead of oils or butter.  Limit your intake of: ? Salt (sodium). Have no more than 1 tsp (2,400 mg) of sodium a day. If you have heart disease or high blood pressure, have less than ? tsp (1,500 mg) of sodium a day. ? Saturated fat. This is fat that is solid at room temperature, such as butter or fat on meat. What lifestyle changes can be made?  Activity  Do moderate-intensity physical activity for at least 30 minutes on at least 5 days of the week, or as much as told by your health care provider.  Ask your health care provider what activities are safe for you. A mix of physical activities may be best, such as walking, swimming, cycling, and strength training.  Try to add physical activity into your day. For example: ? Park in spots that are farther away than usual, so that you walk more. For example, park in a far corner of the parking lot when you go to the office or the grocery store. ? Take a walk during your lunch break. ? Use stairs instead of elevators or escalators. Weight Loss  Lose weight as directed. Your health care provider can determine how much weight loss is best for you and can help you lose weight safely.  If you are overweight or obese, you may be instructed to lose at least 5?7 %   of your body weight. Alcohol and Tobacco   Limit alcohol intake to no more than 1 drink a day for nonpregnant women and 2 drinks a day for men. One drink equals 12 oz of beer, 5 oz of wine, or 1 oz of hard liquor.  Do not use any tobacco products, such as cigarettes, chewing tobacco, and e-cigarettes. If you need help quitting, ask your health care provider. Work With [place] Provider  Have your blood glucose tested regularly, as told by your health care provider.  Discuss your risk factors and how you can reduce your risk for  diabetes.  Get screening tests as told by your health care provider. You may have screening tests regularly, especially if you have certain risk factors for type 2 diabetes.  Make an appointment with a diet and nutrition specialist (registered dietitian). A registered dietitian can help you make a healthy eating plan and can help you understand portion sizes and food labels. Why are these changes important?  It is possible to prevent or delay type 2 diabetes and related health problems by making lifestyle and nutrition changes.  It can be difficult to recognize signs of type 2 diabetes. The best way to avoid possible damage to your body is to take actions to prevent the disease before you develop symptoms. What can happen if changes are not made?  Your blood glucose levels may keep increasing. Having high blood glucose for a long time is dangerous. Too much glucose in your blood can damage your blood vessels, heart, kidneys, nerves, and eyes.  You may develop prediabetes or type 2 diabetes. Type 2 diabetes can lead to many chronic health problems and complications, such as: ? Heart disease. ? Stroke. ? Blindness. ? Kidney disease. ? Depression. ? Poor circulation in the feet and legs, which could lead to surgical removal (amputation) in severe cases. Where to find support:  Ask your health care provider to recommend a registered dietitian, diabetes educator, or weight loss program.  Look for local or online weight loss groups.  Join a gym, fitness club, or outdoor activity group, such as a walking club. Where to find more information: To learn more about diabetes and diabetes prevention, visit:  American Diabetes Association (ADA): www.diabetes.[employer] of Diabetes and Digestive and Kidney Diseases: FindSpin.nl  To learn more about healthy eating, visit:  The U.S. Department of Agriculture Scientist, research (physical sciences)), Choose My Plate:  http://wiley-williams.com/  Office of Disease Prevention and Health Promotion (ODPHP), Dietary Guidelines: SurferLive.at  Summary  You can reduce your risk for type 2 diabetes by increasing your physical activity, eating healthy foods, and losing weight as directed.  Talk with your health care provider about your risk for type 2 diabetes. Ask about any blood tests or screening tests that you need to have. This information is not intended to replace advice given to you by your health care provider. Make sure you discuss any questions you have with your health care provider. Document Released: 02/03/2016 Document Revised: 03/19/2016 Document Reviewed: 12/03/2015 Elsevier Interactive Patient Education  2018 [employer]. Prediabetes Eating Plan Prediabetes-also called impaired glucose tolerance or impaired fasting glucose-is a condition that causes blood sugar (blood glucose) levels to be higher than normal. Following a healthy diet can help to keep prediabetes under control. It can also help to lower the risk of type 2 diabetes and heart disease, which are increased in people who have prediabetes. Along with regular exercise, a healthy diet:  Promotes weight loss.  Helps to control blood sugar levels.  Helps to improve the way that the body uses insulin.  What do I need to know about this eating plan?  Use the glycemic index (GI) to plan your meals. The index tells you how quickly a food will raise your blood sugar. Choose low-GI foods. These foods take a longer time to raise blood sugar.  Pay close attention to the amount of carbohydrates in the food that you eat. Carbohydrates increase blood sugar levels.  Keep track of how many calories you take in. Eating the right amount of calories will help you to achieve a healthy weight. Losing about 7 percent of your starting weight can help to prevent type 2 diabetes.  You may want to follow a Mediterranean diet. This  diet includes a lot of vegetables, lean meats or fish, whole grains, fruits, and healthy oils and fats. What foods can I eat? Grains Whole grains, such as whole-wheat or whole-grain breads, crackers, cereals, and pasta. Unsweetened oatmeal. Bulgur. Barley. Quinoa. Brown rice. Corn or whole-wheat flour tortillas or taco shells. Vegetables Lettuce. Spinach. Peas. Beets. Cauliflower. Cabbage. Broccoli. Carrots. Tomatoes. Squash. Eggplant. Herbs. Peppers. Onions. Cucumbers. Brussels sprouts. Fruits Berries. Bananas. Apples. Oranges. Grapes. Papaya. Mango. Pomegranate. Kiwi. Grapefruit. Cherries. Meats and Other Protein Sources Seafood. Lean meats, such as chicken and [place] or lean cuts of pork and beef. Tofu. Eggs. Nuts. Beans. Dairy Low-fat or fat-free dairy products, such as yogurt, cottage cheese, and cheese. Beverages Water. Tea. Coffee. Sugar-free or diet soda. Seltzer water. Milk. Milk alternatives, such as soy or almond milk. Condiments Mustard. Relish. Low-fat, low-sugar ketchup. Low-fat, low-sugar barbecue sauce. Low-fat or fat-free mayonnaise. Sweets and Desserts Sugar-free or low-fat pudding. Sugar-free or low-fat ice cream and other frozen treats. Fats and Oils Avocado. Walnuts. Olive oil. The items listed above may not be a complete list of recommended foods or beverages. Contact your dietitian for more options. What foods are not recommended? Grains Refined white flour and flour products, such as bread, pasta, snack foods, and cereals. Beverages Sweetened drinks, such as sweet iced tea and soda. Sweets and Desserts Baked goods, such as cake, cupcakes, pastries, cookies, and cheesecake. The items listed above may not be a complete list of foods and beverages to avoid. Contact your dietitian for more information. This information is not intended to replace advice given to you by your health care provider. Make sure you discuss any questions you have with your health care  provider. Document Released: 02/26/2015 Document Revised: 03/19/2016 Document Reviewed: 11/07/2014 Elsevier Interactive Patient Education  2017 [employer].

## 2018-08-29 NOTE — Progress Notes (Signed)
   Subjective:    Patient ID: Mitchell Oliver, male    DOB: 12/04/1945, 72 y.o.   MRN: [id number]  HPI  Patient is here today to follow up on his chronic health issues. He states he eats healthy and gets some exercise.    He sees Dr.Smith the Cardiologist yearly. Due to see him again in January. He says he had a clolonscopy March 2 ,2011. Review of Systems Results for orders placed or performed in visit on 08/29/18  POCT glycosylated hemoglobin (Hb A1C)  Result Value Ref Range   Hemoglobin A1C 5.8 (A) 4.0 - 5.6 %   HbA1c POC (<> result, manual entry)     HbA1c, POC (prediabetic range)     HbA1c, POC (controlled diabetic range)      pts mother has strong hx of diabetes,  Other and unce and coule anunts has diabetes.  Patient admits to dietary noncompliance virtually always in the past, but has improved the recent years  No headache, no major weight loss or weight gain, no chest pain no back pain abdominal pain no change in bowel habits complete ROS otherwise negative      Objective:   Physical Exam  Alert vitals stable, NAD. Blood pressure good on repeat. HEENT normal. Lungs clear. Heart regular rate and rhythm. Impression prediabetes.  Discussed at length.  Diet discussed.  Exercise discussed.  Weight loss encouraged.  Follow-up regular education.      Assessment & Plan:

## 2018-10-08 ENCOUNTER — Other Ambulatory Visit: Payer: Self-pay | Admitting: Family Medicine

## 2018-10-13 ENCOUNTER — Other Ambulatory Visit: Payer: Self-pay | Admitting: Family Medicine

## 2018-11-07 DIAGNOSIS — M9903 Segmental and somatic dysfunction of lumbar region: Secondary | ICD-10-CM | POA: Diagnosis not present

## 2018-11-07 DIAGNOSIS — M5432 Sciatica, left side: Secondary | ICD-10-CM | POA: Diagnosis not present

## 2018-11-09 DIAGNOSIS — H40013 Open angle with borderline findings, low risk, bilateral: Secondary | ICD-10-CM | POA: Diagnosis not present

## 2018-11-10 DIAGNOSIS — M5432 Sciatica, left side: Secondary | ICD-10-CM | POA: Diagnosis not present

## 2018-11-10 DIAGNOSIS — M9903 Segmental and somatic dysfunction of lumbar region: Secondary | ICD-10-CM | POA: Diagnosis not present

## 2018-11-21 DIAGNOSIS — M9903 Segmental and somatic dysfunction of lumbar region: Secondary | ICD-10-CM | POA: Diagnosis not present

## 2018-11-21 DIAGNOSIS — M5432 Sciatica, left side: Secondary | ICD-10-CM | POA: Diagnosis not present

## 2018-11-24 ENCOUNTER — Other Ambulatory Visit: Payer: Self-pay | Admitting: Interventional Cardiology

## 2018-11-24 DIAGNOSIS — M5432 Sciatica, left side: Secondary | ICD-10-CM | POA: Diagnosis not present

## 2018-11-24 DIAGNOSIS — M9903 Segmental and somatic dysfunction of lumbar region: Secondary | ICD-10-CM | POA: Diagnosis not present

## 2018-11-28 DIAGNOSIS — M5432 Sciatica, left side: Secondary | ICD-10-CM | POA: Diagnosis not present

## 2018-11-28 DIAGNOSIS — M9903 Segmental and somatic dysfunction of lumbar region: Secondary | ICD-10-CM | POA: Diagnosis not present

## 2018-11-29 DIAGNOSIS — M545 Low back pain: Secondary | ICD-10-CM | POA: Diagnosis not present

## 2018-11-29 DIAGNOSIS — M5442 Lumbago with sciatica, left side: Secondary | ICD-10-CM | POA: Diagnosis not present

## 2018-11-29 DIAGNOSIS — M7062 Trochanteric bursitis, left hip: Secondary | ICD-10-CM | POA: Diagnosis not present

## 2018-12-10 DIAGNOSIS — M545 Low back pain: Secondary | ICD-10-CM | POA: Diagnosis not present

## 2018-12-10 DIAGNOSIS — M5442 Lumbago with sciatica, left side: Secondary | ICD-10-CM | POA: Diagnosis not present

## 2018-12-10 DIAGNOSIS — M25552 Pain in left hip: Secondary | ICD-10-CM | POA: Diagnosis not present

## 2018-12-12 DIAGNOSIS — M545 Low back pain: Secondary | ICD-10-CM | POA: Diagnosis not present

## 2018-12-13 DIAGNOSIS — H40053 Ocular hypertension, bilateral: Secondary | ICD-10-CM | POA: Diagnosis not present

## 2018-12-22 ENCOUNTER — Other Ambulatory Visit: Payer: Self-pay | Admitting: Family Medicine

## 2018-12-22 DIAGNOSIS — M545 Low back pain: Secondary | ICD-10-CM | POA: Diagnosis not present

## 2018-12-26 DIAGNOSIS — M5442 Lumbago with sciatica, left side: Secondary | ICD-10-CM | POA: Diagnosis not present

## 2018-12-26 DIAGNOSIS — M545 Low back pain: Secondary | ICD-10-CM | POA: Diagnosis not present

## 2019-01-02 DIAGNOSIS — M5416 Radiculopathy, lumbar region: Secondary | ICD-10-CM | POA: Diagnosis not present

## 2019-01-03 ENCOUNTER — Telehealth: Payer: Self-pay

## 2019-01-03 NOTE — Telephone Encounter (Signed)
Dr. Tamala Julian can pt hold ASA and plavix for 5-7 days?  For ESI  Injection.  Last stent 2016.  Thanks.

## 2019-01-03 NOTE — Telephone Encounter (Signed)
   [facility] Pre-operative Risk Assessment    Request for surgical clearance:  1. What type of surgery is being performed? ESI injection   2. When is this surgery scheduled? TBD   3. What type of clearance is required (medical clearance vs. Pharmacy clearance to hold med vs. Both)? Pharmacy clearance   4. Are there any medications that need to be held prior to surgery and how long? Plavix to be held for 5-7 days prior to injection   5. Practice name and name of physician performing surgery? [facility]   6. What is your office phone number [phone]    7.   What is your office fax number [phone]  8.   Anesthesia type (None, local, MAC, general) ? None listed   Jacinta Shoe 01/03/2019, 10:23 AM  _________________________________________________________________   (provider comments below)

## 2019-01-03 NOTE — Telephone Encounter (Signed)
Okay to hold aspirin and plavix.

## 2019-01-10 DIAGNOSIS — M5416 Radiculopathy, lumbar region: Secondary | ICD-10-CM | POA: Diagnosis not present

## 2019-01-16 ENCOUNTER — Other Ambulatory Visit: Payer: Self-pay | Admitting: *Deleted

## 2019-01-16 ENCOUNTER — Telehealth: Payer: Self-pay | Admitting: Family Medicine

## 2019-01-16 MED ORDER — ATORVASTATIN CALCIUM 80 MG PO TABS: ORAL_TABLET | ORAL | 2 refills | Status: DC

## 2019-01-16 MED ORDER — PANTOPRAZOLE SODIUM 40 MG PO TBEC: DELAYED_RELEASE_TABLET | ORAL | 0 refills | Status: DC

## 2019-01-16 MED ORDER — BENAZEPRIL HCL 20 MG PO TABS: ORAL_TABLET | ORAL | 0 refills | Status: DC

## 2019-01-16 MED ORDER — AMLODIPINE BESYLATE 5 MG PO TABS: ORAL_TABLET | ORAL | 2 refills | Status: DC

## 2019-01-16 MED ORDER — METOPROLOL SUCCINATE ER 50 MG PO TB24: ORAL_TABLET | ORAL | 0 refills | Status: DC

## 2019-01-16 MED ORDER — POTASSIUM CHLORIDE CRYS ER 20 MEQ PO TBCR: EXTENDED_RELEASE_TABLET | ORAL | 0 refills | Status: DC

## 2019-01-16 MED ORDER — HYDROCHLOROTHIAZIDE 25 MG PO TABS: 25.0000 mg | ORAL_TABLET | Freq: Every morning | ORAL | 0 refills | Status: DC

## 2019-01-16 NOTE — Telephone Encounter (Signed)
Pt's 6 month med check for tomorrow was cancelled (due to Covid risk)  Told the patient we could refill meds until appointment could be rescheduled.  Please advise on refills & when patient can be rescheduled & call pt     [facility]/[place]

## 2019-01-16 NOTE — Telephone Encounter (Signed)
3 month supply of chronic refills given per dr Richardson Landry and pt notified to follow up in 3 months. Pt verbalized understanding.

## 2019-01-17 ENCOUNTER — Ambulatory Visit: Payer: Medicare Other | Admitting: Family Medicine

## 2019-01-26 ENCOUNTER — Telehealth: Payer: Self-pay | Admitting: Interventional Cardiology

## 2019-01-26 NOTE — Telephone Encounter (Signed)
Spoke with pt in regards to appt scheduled with Dr. Tamala Julian on 01/30/2019.  Pt denies any cardiac issues.  Rescheduled pt to 05/16/2019.  Advised to contact office sooner if any issues.

## 2019-01-29 ENCOUNTER — Other Ambulatory Visit: Payer: Self-pay | Admitting: Family Medicine

## 2019-01-30 ENCOUNTER — Ambulatory Visit: Payer: Medicare Other | Admitting: Interventional Cardiology

## 2019-02-18 ENCOUNTER — Other Ambulatory Visit: Payer: Self-pay | Admitting: Family Medicine

## 2019-02-20 ENCOUNTER — Other Ambulatory Visit: Payer: Self-pay | Admitting: Interventional Cardiology

## 2019-02-20 MED ORDER — CLOPIDOGREL BISULFATE 75 MG PO TABS: 75.0000 mg | ORAL_TABLET | Freq: Every day | ORAL | 1 refills | Status: DC

## 2019-03-03 DIAGNOSIS — H40053 Ocular hypertension, bilateral: Secondary | ICD-10-CM | POA: Diagnosis not present

## 2019-03-31 ENCOUNTER — Ambulatory Visit (INDEPENDENT_AMBULATORY_CARE_PROVIDER_SITE_OTHER): Payer: Medicare Other | Admitting: Family Medicine

## 2019-03-31 ENCOUNTER — Other Ambulatory Visit: Payer: Self-pay

## 2019-03-31 ENCOUNTER — Encounter: Payer: Self-pay | Admitting: Family Medicine

## 2019-03-31 ENCOUNTER — Other Ambulatory Visit: Payer: Self-pay | Admitting: Family Medicine

## 2019-03-31 VITALS — BP 122/78 | Temp 97.2°F | Ht 71.5 in | Wt 259.0 lb

## 2019-03-31 DIAGNOSIS — J431 Panlobular emphysema: Secondary | ICD-10-CM

## 2019-03-31 DIAGNOSIS — Z0000 Encounter for general adult medical examination without abnormal findings: Secondary | ICD-10-CM | POA: Diagnosis not present

## 2019-03-31 DIAGNOSIS — R7303 Prediabetes: Secondary | ICD-10-CM

## 2019-03-31 DIAGNOSIS — Z8673 Personal history of transient ischemic attack (TIA), and cerebral infarction without residual deficits: Secondary | ICD-10-CM | POA: Diagnosis not present

## 2019-03-31 DIAGNOSIS — Z79899 Other long term (current) drug therapy: Secondary | ICD-10-CM | POA: Diagnosis not present

## 2019-03-31 DIAGNOSIS — Z125 Encounter for screening for malignant neoplasm of prostate: Secondary | ICD-10-CM

## 2019-03-31 DIAGNOSIS — E785 Hyperlipidemia, unspecified: Secondary | ICD-10-CM

## 2019-03-31 DIAGNOSIS — Z1211 Encounter for screening for malignant neoplasm of colon: Secondary | ICD-10-CM

## 2019-03-31 DIAGNOSIS — I10 Essential (primary) hypertension: Secondary | ICD-10-CM | POA: Diagnosis not present

## 2019-03-31 MED ORDER — METOPROLOL SUCCINATE ER 50 MG PO TB24: ORAL_TABLET | ORAL | 1 refills | Status: DC

## 2019-03-31 MED ORDER — AMLODIPINE BESYLATE 5 MG PO TABS: ORAL_TABLET | ORAL | 1 refills | Status: DC

## 2019-03-31 MED ORDER — POTASSIUM CHLORIDE CRYS ER 20 MEQ PO TBCR: EXTENDED_RELEASE_TABLET | ORAL | 1 refills | Status: DC

## 2019-03-31 MED ORDER — BENAZEPRIL HCL 20 MG PO TABS: ORAL_TABLET | ORAL | 1 refills | Status: DC

## 2019-03-31 MED ORDER — ATORVASTATIN CALCIUM 80 MG PO TABS: ORAL_TABLET | ORAL | 1 refills | Status: DC

## 2019-03-31 MED ORDER — PANTOPRAZOLE SODIUM 40 MG PO TBEC: DELAYED_RELEASE_TABLET | ORAL | 1 refills | Status: DC

## 2019-03-31 MED ORDER — HYDROCHLOROTHIAZIDE 25 MG PO TABS: 25.0000 mg | ORAL_TABLET | Freq: Every morning | ORAL | 1 refills | Status: DC

## 2019-03-31 NOTE — Progress Notes (Signed)
Subjective:    Patient ID: Mitchell Oliver, male    DOB: Jun 06, 1946, 73 y.o.   MRN: [id number]  HPI The patient comes in today for a wellness visit.    A review of their health history was completed.  A review of medications was also completed.  Any needed refills; update all meds  Eating habits: health conscious  Falls/  MVA accidents in past few months: no accidents, one fall when he missed a step.   Regular exercise: yes - walks 1 -2 miles per day, sit ups.  Specialist pt sees on regular basis: ortho for back Injections and cardiologist DR. Smith  Preventative health issues were discussed.   Additional concerns: none  Results for orders placed or performed in visit on 08/29/18  POCT glycosylated hemoglobin (Hb A1C)  Result Value Ref Range   Hemoglobin A1C 5.8 (A) 4.0 - 5.6 %   HbA1c POC (<> result, manual entry)     HbA1c, POC (prediabetic range)     HbA1c, POC (controlled diabetic range)     Blood pressure medicine and blood pressure levels reviewed today with patient. Compliant with blood pressure medicine. States does not miss a dose. No obvious side effects. Blood pressure generally good when checked elsewhere. Watching salt intake.   Patient continues to take lipid medication regularly. No obvious side effects from it. Generally does not miss a dose. Prior blood work results are reviewed with patient. Patient continues to work on fat intake in diet   130 over 80    diet overall good  Doing well   Review of Systems  Constitutional: Negative for activity change, appetite change and fever.  HENT: Negative for congestion and rhinorrhea.   Eyes: Negative for discharge.  Respiratory: Negative for cough and wheezing.   Cardiovascular: Negative for chest pain.  Gastrointestinal: Negative for abdominal pain, blood in stool and vomiting.  Genitourinary: Negative for difficulty urinating and frequency.  Musculoskeletal: Negative for neck pain.  Skin: Negative for  rash.  Allergic/Immunologic: Negative for environmental allergies and food allergies.  Neurological: Negative for weakness and headaches.  Psychiatric/Behavioral: Negative for agitation.  All other systems reviewed and are negative.      Objective:   Physical Exam Vitals signs reviewed.  Constitutional:      Appearance: He is well-developed.  HENT:     Head: Normocephalic and atraumatic.     Right Ear: External ear normal.     Left Ear: External ear normal.     Nose: Nose normal.  Eyes:     Pupils: Pupils are equal, round, and reactive to light.  Neck:     Musculoskeletal: Normal range of motion and neck supple.     Thyroid: No thyromegaly.  Cardiovascular:     Rate and Rhythm: Normal rate and regular rhythm.     Heart sounds: Normal heart sounds. No murmur.  Pulmonary:     Effort: Pulmonary effort is normal. No respiratory distress.     Breath sounds: Normal breath sounds. No wheezing.  Abdominal:     General: Bowel sounds are normal. There is no distension.     Palpations: Abdomen is soft. There is no mass.     Tenderness: There is no abdominal tenderness.  Genitourinary:    Penis: Normal.   Musculoskeletal: Normal range of motion.  Lymphadenopathy:     Cervical: No cervical adenopathy.  Skin:    General: Skin is warm and dry.     Findings: No erythema.  Neurological:  Mental Status: He is alert.     Motor: No abnormal muscle tone.  Psychiatric:        Behavior: Behavior normal.        Judgment: Judgment normal.    Results for orders placed or performed in visit on 03/31/19  Lipid panel  Result Value Ref Range   Cholesterol, Total 101 100 - 199 mg/dL   Triglycerides 102 0 - 149 mg/dL   HDL 39 (L) >39 mg/dL   VLDL Cholesterol Cal 20 5 - 40 mg/dL   LDL Calculated 42 0 - 99 mg/dL   Chol/HDL Ratio 2.6 0.0 - 5.0 ratio  Hepatic function panel  Result Value Ref Range   Total Protein 7.2 6.0 - 8.5 g/dL   Albumin 4.5 3.7 - 4.7 g/dL   Bilirubin Total 1.5 (H)  0.0 - 1.2 mg/dL   Bilirubin, Direct 0.38 0.00 - 0.40 mg/dL   Alkaline Phosphatase 49 39 - 117 IU/L   AST 30 0 - 40 IU/L   ALT 40 0 - 44 IU/L  Basic metabolic panel  Result Value Ref Range   Glucose 115 (H) 65 - 99 mg/dL   BUN 7 (L) 8 - 27 mg/dL   Creatinine, Ser 1.10 0.76 - 1.27 mg/dL   GFR calc non Af Amer 66 >59 mL/min/1.73   GFR calc Af Amer 76 >59 mL/min/1.73   BUN/Creatinine Ratio 6 (L) 10 - 24   Sodium 133 (L) 134 - 144 mmol/L   Potassium 4.6 3.5 - 5.2 mmol/L   Chloride 99 96 - 106 mmol/L   CO2 18 (L) 20 - 29 mmol/L   Calcium 9.9 8.6 - 10.2 mg/dL  PSA  Result Value Ref Range   Prostate Specific Ag, Serum 0.7 0.0 - 4.0 ng/mL          Assessment & Plan:  Impression wellness exam.  Diet discussed exercise discussed.  Vaccines discussed colonoscopy discussed.  Medications refilled.  Blood work discussed  2.  Hypertension good control discussed maintain same meds  3.  Hyperlipidemia.  Blood work also discussed  4. prediabetes.  A1c stable goals discussed to maintain

## 2019-04-01 LAB — BASIC METABOLIC PANEL
BUN/Creatinine Ratio: 6 — ABNORMAL LOW (ref 10–24)
BUN: 7 mg/dL — ABNORMAL LOW (ref 8–27)
CO2: 18 mmol/L — ABNORMAL LOW (ref 20–29)
Calcium: 9.9 mg/dL (ref 8.6–10.2)
Chloride: 99 mmol/L (ref 96–106)
Creatinine, Ser: 1.1 mg/dL (ref 0.76–1.27)
GFR calc Af Amer: 76 mL/min/{1.73_m2} (ref >59)
GFR calc non Af Amer: 66 mL/min/{1.73_m2} (ref >59)
Glucose: 115 mg/dL — ABNORMAL HIGH (ref 65–99)
Potassium: 4.6 mmol/L (ref 3.5–5.2)
Sodium: 133 mmol/L — ABNORMAL LOW (ref 134–144)

## 2019-04-01 LAB — HEPATIC FUNCTION PANEL
ALT: 40 IU/L (ref 0–44)
AST: 30 IU/L (ref 0–40)
Albumin: 4.5 g/dL (ref 3.7–4.7)
Alkaline Phosphatase: 49 IU/L (ref 39–117)
Bilirubin Total: 1.5 mg/dL — ABNORMAL HIGH (ref 0.0–1.2)
Bilirubin, Direct: 0.38 mg/dL (ref 0.00–0.40)
Total Protein: 7.2 g/dL (ref 6.0–8.5)

## 2019-04-01 LAB — LIPID PANEL
Chol/HDL Ratio: 2.6 ratio (ref 0.0–5.0)
Cholesterol, Total: 101 mg/dL (ref 100–199)
HDL: 39 mg/dL — ABNORMAL LOW (ref >39)
LDL Calculated: 42 mg/dL (ref 0–99)
Triglycerides: 102 mg/dL (ref 0–149)
VLDL Cholesterol Cal: 20 mg/dL (ref 5–40)

## 2019-04-01 LAB — PSA: Prostate Specific Ag, Serum: 0.7 ng/mL (ref 0.0–4.0)

## 2019-04-02 ENCOUNTER — Encounter: Payer: Self-pay | Admitting: Family Medicine

## 2019-04-05 DIAGNOSIS — M5416 Radiculopathy, lumbar region: Secondary | ICD-10-CM | POA: Diagnosis not present

## 2019-04-07 ENCOUNTER — Encounter: Payer: Self-pay | Admitting: Family Medicine

## 2019-04-12 ENCOUNTER — Encounter: Payer: Self-pay | Admitting: Gastroenterology

## 2019-04-13 ENCOUNTER — Other Ambulatory Visit: Payer: Self-pay | Admitting: Family Medicine

## 2019-04-18 ENCOUNTER — Telehealth: Payer: Self-pay

## 2019-04-18 NOTE — Telephone Encounter (Signed)

## 2019-04-18 NOTE — Progress Notes (Signed)
Cardiology Office Note:    Date:  04/19/2019   ID:  Eastyn, Dattilo [birth year], MRN [id number]  PCP:  Mikey Kirschner, MD  Cardiologist:  Sinclair Grooms, MD   Referring MD: Mikey Kirschner, MD   Chief Complaint  Patient presents with  . Coronary Artery Disease    History of Present Illness:    Mitchell Oliver is a 73 y.o. male with a hx of CAD with drug-eluting stent implantation in setting of non-ST elevation MI in PDA and diagonal back in 2016.Diagonal stenting was associated with inability to fully expand the drug-eluting stent.Also history of emphysema, hypertension and hyperlipidemia.   He is doing well.  He is walking.  Medications are agreeing with him.  He has not had syncope, lower extremity swelling, or other complaints.  He denies difficulty sleeping.  There is no claudication.  He is enjoying walking.  Past Medical History:  Diagnosis Date  . Allergy   . COPD (chronic obstructive pulmonary disease) ([place])   . CVA (cerebral vascular accident) ([place]) 2012   denies residual on 07/22/2015  . ED (erectile dysfunction)   . Family history of adverse reaction to anesthesia    "think my mother had real bad headaches after anesthesia"  . GERD (gastroesophageal reflux disease)   . Hypercholesterolemia   . Hypertension   . IFG (impaired fasting glucose)   . NSTEMI (non-ST elevated myocardial infarction) ([place])   . Pneumonia ~ 2005    Past Surgical History:  Procedure Laterality Date  . APPENDECTOMY  2009 [facility]  . BACK SURGERY    . BIOPSY  05/04/2014   Procedure: BIOPSY;  Surgeon: Danie Binder, MD;  [facility];  Service: Endoscopy;;  . CARDIAC CATHETERIZATION N/A 07/18/2015   Procedure: Left Heart Cath and Coronary Angiography;  Surgeon: Belva Crome, MD;  Location: [place] [facility];  Service: Cardiovascular;  Laterality: N/A;  . CARDIAC CATHETERIZATION N/A 07/18/2015   Procedure: Coronary Stent Intervention;  Surgeon: Belva Crome, MD;   Location: [place] [facility];  Service: Cardiovascular;  Laterality: N/A;  . CARDIAC CATHETERIZATION N/A 07/22/2015   Procedure: Coronary Stent Intervention;  Surgeon: Belva Crome, MD;  Location: [place] [facility];  Service: Cardiovascular;  Laterality: N/A;  . COLONOSCOPY  2009  . ESOPHAGOGASTRODUODENOSCOPY N/A 05/04/2014   Procedure: ESOPHAGOGASTRODUODENOSCOPY (EGD);  Surgeon: Danie Binder, MD;  [facility];  Service: Endoscopy;  Laterality: N/A;  9:15  . POSTERIOR LAMINECTOMY / DECOMPRESSION LUMBAR SPINE  06/03/2015  . SAVORY DILATION N/A 05/04/2014   Procedure: SAVORY DILATION;  Surgeon: Danie Binder, MD;  [facility];  Service: Endoscopy;  Laterality: N/A;    Current Medications: Current Meds  Medication Sig  . acetaminophen (TYLENOL) 325 MG tablet Take 2 tablets (650 mg total) by mouth every 4 (four) hours as needed for headache or mild pain.  Marland Kitchen albuterol (PROVENTIL HFA;VENTOLIN HFA) 108 (90 Base) MCG/ACT inhaler Inhale 2 puffs into the lungs every 4 (four) hours as needed for wheezing or shortness of breath.  Marland Kitchen amLODipine (NORVASC) 5 MG tablet TAKE 1 TABLET(5 MG) BY MOUTH DAILY  . Ascorbic Acid (VITAMIN C PO) Take 1 tablet by mouth daily.   Marland Kitchen aspirin EC 81 MG tablet Take 81 mg by mouth daily.  Marland Kitchen atorvastatin (LIPITOR) 80 MG tablet Take one tablet daily  . benazepril (LOTENSIN) 20 MG tablet TAKE 1 TABLET BY MOUTH EVERY DAY  . CIALIS 20 MG  tablet TAKE 1 TABLET BY MOUTH DAILY IF NEEDED FOR ERECTILE DYSFUNCTION, TAKE 2 HOURS TO INTERCOURSE  . clopidogrel (PLAVIX) 75 MG tablet Take 1 tablet (75 mg total) by mouth daily. Please keep upcoming appt in July with Dr. Tamala Julian for future refills. Thank you  . Cyanocobalamin (VITAMIN B-12 CR) 1000 MCG TBCR Take 1 tablet by mouth daily.  . fish oil-omega-3 fatty acids 1000 MG capsule Take 2 g by mouth daily.  . fluticasone (FLONASE) 50 MCG/ACT nasal spray Place 1 spray into both nostrils daily as needed for allergies or  rhinitis.   Marland Kitchen gabapentin (NEURONTIN) 300 MG capsule Take 300 mg by mouth daily.  . hydrochlorothiazide (HYDRODIURIL) 25 MG tablet Take 1 tablet (25 mg total) by mouth every morning.  . latanoprost (XALATAN) 0.005 % ophthalmic solution Place 1 drop into both eyes at bedtime.   . metoprolol succinate (TOPROL-XL) 50 MG 24 hr tablet TAKE 1 TABLET BY MOUTH DAILY WITH OR IMMEDIATELY FOLLOWING A MEAL  . Multiple Vitamins-Minerals (PRESERVISION AREDS PO) Take by mouth 2 (two) times daily.  . nitroGLYCERIN (NITROSTAT) 0.4 MG SL tablet Place 1 tablet (0.4 mg total) under the tongue every 5 (five) minutes as needed for chest pain (maxium 3 tablets daily). Do not take within 36 hrs of Cialis use  . pantoprazole (PROTONIX) 40 MG tablet TAKE 1 TABLET(40 MG) BY MOUTH EVERY MORNING  . potassium chloride SA (K-DUR) 20 MEQ tablet TAKE 1 TABLET BY MOUTH DAILY  . Zinc Acetate 25 MG CAPS Take 50 mg by mouth daily.     Allergies:   Patient has no known allergies.   Social History   Socioeconomic History  . Marital status: Divorced    Spouse name: Not on file  . Number of children: Not on file  . Years of education: Not on file  . Highest education level: Not on file  Occupational History  . Not on file  Social Needs  . Financial resource strain: Not on file  . Food insecurity    Worry: Not on file    Inability: Not on file  . Transportation needs    Medical: Not on file    Non-medical: Not on file  Tobacco Use  . Smoking status: Former Smoker    Packs/day: 2.00    Years: 24.00    Pack years: 48.00    Types: Cigarettes    Quit date: 12/24/1985    Years since quitting: 33.3  . Smokeless tobacco: Never Used  Substance and Sexual Activity  . Alcohol use: Yes    Alcohol/week: 7.0 standard drinks    Types: 7 Cans of beer per week  . Drug use: No  . Sexual activity: Yes  Lifestyle  . Physical activity    Days per week: Not on file    Minutes per session: Not on file  . Stress: Not on file   Relationships  . Social Herbalist on phone: Not on file    Gets together: Not on file    Attends religious service: Not on file    Active member of club or organization: Not on file    Attends meetings of clubs or organizations: Not on file    Relationship status: Not on file  Other Topics Concern  . Not on file  Social History Narrative  . Not on file     Family History: The patient's family history includes Heart attack in his father; Hypertension in his father and mother; Stroke in  his father. There is no history of Colon cancer or Colon polyps.  ROS:   Please see the history of present illness.    Weight gain is his only concern.  All other systems reviewed and are negative.  EKGs/Labs/Other Studies Reviewed:    The following studies were reviewed today: No new imaging data  EKG:  EKG normal sinus rhythm with normal overall appearance.  Recent Labs: 03/31/2019: ALT 40; BUN 7; Creatinine, Ser 1.10; Potassium 4.6; Sodium 133  Recent Lipid Panel    Component Value Date/Time   CHOL 101 03/31/2019 1112   TRIG 102 03/31/2019 1112   HDL 39 (L) 03/31/2019 1112   CHOLHDL 2.6 03/31/2019 1112   CHOLHDL 2.9 07/03/2014 0814   VLDL 21 07/03/2014 0814   LDLCALC 42 03/31/2019 1112    Physical Exam:    VS:  BP (!) 146/76   Pulse (!) 59   Ht 5' 11.5" (1.816 m)   Wt 254 lb 12.8 oz (115.6 kg)   SpO2 97%   BMI 35.04 kg/m     Wt Readings from Last 3 Encounters:  04/19/19 254 lb 12.8 oz (115.6 kg)  03/31/19 259 lb (117.5 kg)  08/29/18 253 lb 0.2 oz (114.8 kg)     GEN: Obese. No acute distress HEENT: Normal NECK: No JVD. LYMPHATICS: No lymphadenopathy CARDIAC: RRR.  No murmur, no gallop, no edema VASCULAR: 2+ bilateral radial pulses, no bruits RESPIRATORY:  Clear to auscultation without rales, wheezing or rhonchi  ABDOMEN: Soft, non-tender, non-distended, No pulsatile mass, MUSCULOSKELETAL: No deformity  SKIN: Warm and dry NEUROLOGIC:  Alert and oriented x 3  PSYCHIATRIC:  Normal affect   ASSESSMENT:    1. CAD S/P percutaneous coronary angioplasty   2. Essential hypertension, benign   3. Other emphysema ([place])   4. Dyslipidemia   5. Educated About Covid-19 Virus Infection    PLAN:    In order of problems listed above:  1. Doing well status post stenting several years ago.  I have continued clopidogrel because of incomplete stent expansion in the circumflex.  He is having no bleeding issues on clopidogrel monotherapy.  Secondary prevention discussed. 2. Acceptable blood pressure today. 3. Not discussed 4. Lipids excellent   Overall education and awareness concerning primary/secondary risk prevention was discussed in detail: LDL less than 70, hemoglobin A1c less than 7, blood pressure target less than 130/80 mmHg, >150 minutes of moderate aerobic activity per week, avoidance of smoking, weight control (via diet and exercise), and continued surveillance/management of/for obstructive sleep apnea.    Medication Adjustments/Labs and Tests Ordered: Current medicines are reviewed at length with the patient today.  Concerns regarding medicines are outlined above.  Orders Placed This Encounter  Procedures  . EKG 12-Lead   No orders of the defined types were placed in this encounter.   There are no Patient Instructions on file for this visit.   Signed, Sinclair Grooms, MD  04/19/2019 9:21 AM    [place]

## 2019-04-19 ENCOUNTER — Ambulatory Visit (INDEPENDENT_AMBULATORY_CARE_PROVIDER_SITE_OTHER): Payer: Medicare Other | Admitting: Interventional Cardiology

## 2019-04-19 ENCOUNTER — Other Ambulatory Visit: Payer: Self-pay

## 2019-04-19 ENCOUNTER — Encounter: Payer: Self-pay | Admitting: Interventional Cardiology

## 2019-04-19 VITALS — BP 146/76 | HR 59 | Ht 71.5 in | Wt 254.8 lb

## 2019-04-19 DIAGNOSIS — E785 Hyperlipidemia, unspecified: Secondary | ICD-10-CM

## 2019-04-19 DIAGNOSIS — Z9861 Coronary angioplasty status: Secondary | ICD-10-CM | POA: Diagnosis not present

## 2019-04-19 DIAGNOSIS — J438 Other emphysema: Secondary | ICD-10-CM

## 2019-04-19 DIAGNOSIS — I10 Essential (primary) hypertension: Secondary | ICD-10-CM | POA: Diagnosis not present

## 2019-04-19 DIAGNOSIS — I2510 Atherosclerotic heart disease of native coronary artery without angina pectoris: Secondary | ICD-10-CM

## 2019-04-19 DIAGNOSIS — Z7189 Other specified counseling: Secondary | ICD-10-CM

## 2019-04-19 NOTE — Patient Instructions (Signed)

## 2019-04-26 DIAGNOSIS — L821 Other seborrheic keratosis: Secondary | ICD-10-CM | POA: Diagnosis not present

## 2019-04-26 DIAGNOSIS — L570 Actinic keratosis: Secondary | ICD-10-CM | POA: Diagnosis not present

## 2019-04-26 DIAGNOSIS — D225 Melanocytic nevi of trunk: Secondary | ICD-10-CM | POA: Diagnosis not present

## 2019-04-26 DIAGNOSIS — X32XXXD Exposure to sunlight, subsequent encounter: Secondary | ICD-10-CM | POA: Diagnosis not present

## 2019-05-04 DIAGNOSIS — H40013 Open angle with borderline findings, low risk, bilateral: Secondary | ICD-10-CM | POA: Diagnosis not present

## 2019-05-16 ENCOUNTER — Ambulatory Visit: Payer: Medicare Other | Admitting: Interventional Cardiology

## 2019-06-21 ENCOUNTER — Ambulatory Visit (INDEPENDENT_AMBULATORY_CARE_PROVIDER_SITE_OTHER): Payer: Self-pay | Admitting: *Deleted

## 2019-06-21 ENCOUNTER — Other Ambulatory Visit: Payer: Self-pay

## 2019-06-21 DIAGNOSIS — Z1211 Encounter for screening for malignant neoplasm of colon: Secondary | ICD-10-CM

## 2019-06-21 MED ORDER — PEG 3350-KCL-NA BICARB-NACL 420 G PO SOLR: 4000.0000 mL | Freq: Once | ORAL | 0 refills | Status: AC

## 2019-06-21 NOTE — Patient Instructions (Signed)
Mitchell Oliver   04/24/1946 MRN: [id number]    Procedure Date: 08/04/2019 Time to register: 11:30 am Place to register: [facility] Procedure Time: 12:30 pm Scheduled provider: Dr. Oneida Alar  PREPARATION FOR COLONOSCOPY WITH TRI-LYTE SPLIT PREP  Please notify us immediately if you are diabetic, take iron supplements, or if you are on Coumadin or any other blood thinners.   You will need to purchase 1 fleet enema and 1 box of Bisacodyl 69m tablets.   1 DAY BEFORE PROCEDURE:  DATE: 08/03/2019  DAY: Thursday Continue clear liquids the entire day - NO SOLID FOOD.    At 2:00 pm:  Take 2 Bisacodyl tablets.   At 4:00pm:  Start drinking your solution. Make sure you mix well per instructions on the bottle. Try to drink 1 (one) 8 ounce glass every 10-15 minutes until you have consumed HALF the jug. You should complete by 6:00pm.You must keep the left over solution refrigerated until completed next day.  Continue clear liquids. You must drink plenty of clear liquids to prevent dehyration and kidney failure.     DAY OF PROCEDURE:   DATE: 08/04/2019   DAY: Friday If you take medications for your heart, blood pressure or breathing, you may take these medications.    Five hours before your procedure time @ 7:30 am:  Finish remaining amout of bowel prep, drinking 1 (one) 8 ounce glass every 10-15 minutes until complete. You have two hours to consume remaining prep.   Three hours before your procedure time @ 9:30 am:  Nothing by mouth.   At least one hour before going to the hospital:  Give yourself one Fleet enema. You may take your morning medications with sip of water unless we have instructed otherwise.      Please see below for Dietary Information.  CLEAR LIQUIDS INCLUDE:  Water Jello (NOT red in color)   Ice Popsicles (NOT red in color)   Tea (sugar ok, no milk/cream) Powdered fruit flavored drinks  Coffee (sugar ok, no milk/cream) Gatorade/ Lemonade/ Kool-Aid  (NOT red in  color)   Juice: apple, white grape, white cranberry Soft drinks  Clear bullion, consomme, broth (fat free beef/chicken/vegetable)  Carbonated beverages (any kind)  Strained chicken noodle soup Hard Candy   Remember: Clear liquids are liquids that will allow you to see your fingers on the other side of a clear glass. Be sure liquids are NOT red in color, and not cloudy, but CLEAR.  DO NOT EAT OR DRINK ANY OF THE FOLLOWING:  Dairy products of any kind   Cranberry juice Tomato juice / V8 juice   Grapefruit juice Orange juice     Red grape juice  Do not eat any solid foods, including such foods as: cereal, oatmeal, yogurt, fruits, vegetables, creamed soups, eggs, bread, crackers, pureed foods in a blender, etc.   HELPFUL HINTS FOR DRINKING PREP SOLUTION:   Make sure prep is extremely cold. Mix and refrigerate the the morning of the prep. You may also put in the freezer.   You may try mixing some Crystal Light or Country Time Lemonade if you prefer. Mix in small amounts; add more if necessary.  Try drinking through a straw  Rinse mouth with water or a mouthwash between glasses, to remove after-taste.  Try sipping on a cold beverage /ice/ popsicles between glasses of prep.  Place a piece of sugar-free hard candy in mouth between glasses.  If you become nauseated, try consuming smaller amounts, or stretch out  the time between glasses. Stop for 30-60 minutes, then slowly start back drinking.        OTHER INSTRUCTIONS  You will need a responsible adult at least 75 years of age to accompany you and drive you home. This person must remain in the waiting room during your procedure. The hospital will cancel your procedure if you do not have a responsible adult with you.   1. Wear loose fitting clothing that is easily removed. 2. Leave jewelry and other valuables at home.  3. Remove all body piercing jewelry and leave at home. 4. Total time from sign-in until discharge is approximately  2-3 hours. 5. You should go home directly after your procedure and rest. You can resume normal activities the day after your procedure. 6. The day of your procedure you should not:  Drive  Make legal decisions  Operate machinery  Drink alcohol  Return to work   You may call the office (Dept: [phone]) before 5:00pm, or page the doctor on call [phone]) after 5:00pm, for further instructions, if necessary.   Insurance Information YOU WILL NEED TO CHECK WITH YOUR INSURANCE COMPANY FOR THE BENEFITS OF COVERAGE YOU HAVE FOR THIS PROCEDURE.  UNFORTUNATELY, NOT ALL INSURANCE COMPANIES HAVE BENEFITS TO COVER ALL OR PART OF THESE TYPES OF PROCEDURES.  IT IS YOUR RESPONSIBILITY TO CHECK YOUR BENEFITS, HOWEVER, WE WILL BE GLAD TO ASSIST YOU WITH ANY CODES YOUR INSURANCE COMPANY MAY NEED.    PLEASE NOTE THAT MOST INSURANCE COMPANIES WILL NOT COVER A SCREENING COLONOSCOPY FOR PEOPLE UNDER THE AGE OF 50  IF YOU HAVE BCBS INSURANCE, YOU MAY HAVE BENEFITS FOR A SCREENING COLONOSCOPY BUT IF POLYPS ARE FOUND THE DIAGNOSIS WILL CHANGE AND THEN YOU MAY HAVE A DEDUCTIBLE THAT WILL NEED TO BE MET. SO PLEASE MAKE SURE YOU CHECK YOUR BENEFITS FOR A SCREENING COLONOSCOPY AS WELL AS A DIAGNOSTIC COLONOSCOPY.

## 2019-06-21 NOTE — Progress Notes (Signed)
Gastroenterology Pre-Procedure Review  Request Date: 06/21/2019 Requesting Physician: Dr. [name], Last TCS 10 years ago done by [facility] per pt, no polyps per pt  PATIENT REVIEW QUESTIONS: The patient responded to the following health history questions as indicated:    1. Diabetes Melitis: No 2. Joint replacements in the past 12 months: No 3. Major health problems in the past 3 months: No 4. Has an artificial valve or MVP: No 5. Has a defibrillator: No 6. Has been advised in past to take antibiotics in advance of a procedure like teeth cleaning: No 7. Family history of colon cancer: No  8. Alcohol Use: Yes, 4 cans of beer and 2 glasses of wine a week 9. History of sleep apnea: No  10. History of coronary artery or other vascular stents placed within the last 12 months: No, but had stents placed 4 years ago 11. History of any prior anesthesia complications: No    MEDICATIONS & ALLERGIES:    Patient reports the following regarding taking any blood thinners:   Plavix? Yes Aspirin? Yes Coumadin? No Brilinta? No Xarelto? No Eliquis? No Pradaxa? No Savaysa? No Effient? No  Patient confirms/reports the following medications:  Current Outpatient Medications  Medication Sig Dispense Refill  . acetaminophen (TYLENOL) 325 MG tablet Take 2 tablets (650 mg total) by mouth every 4 (four) hours as needed for headache or mild pain. (Patient taking differently: Take 650 mg by mouth as needed for mild pain or headache. )    . amLODipine (NORVASC) 5 MG tablet TAKE 1 TABLET(5 MG) BY MOUTH DAILY 90 tablet 1  . Ascorbic Acid (VITAMIN C PO) Take 4 tablets by mouth daily. Takes 4000 mg daily    . aspirin EC 81 MG tablet Take 81 mg by mouth daily.    Marland Kitchen atorvastatin (LIPITOR) 80 MG tablet Take one tablet daily 90 tablet 1  . benazepril (LOTENSIN) 20 MG tablet TAKE 1 TABLET BY MOUTH EVERY DAY 90 tablet 1  . CIALIS 20 MG tablet TAKE 1 TABLET BY MOUTH DAILY IF NEEDED FOR ERECTILE DYSFUNCTION, TAKE 2  HOURS TO INTERCOURSE (Patient taking differently: as needed. ) 6 tablet 0  . clopidogrel (PLAVIX) 75 MG tablet Take 1 tablet (75 mg total) by mouth daily. Please keep upcoming appt in July with Dr. Tamala Julian for future refills. Thank you 90 tablet 1  . Cyanocobalamin (VITAMIN B-12 CR) 1000 MCG TBCR Take 1 tablet by mouth daily.    . fish oil-omega-3 fatty acids 1000 MG capsule Take 2 g by mouth daily.    . fluticasone (FLONASE) 50 MCG/ACT nasal spray Place 1 spray into both nostrils daily as needed for allergies or rhinitis.     . hydrochlorothiazide (HYDRODIURIL) 25 MG tablet Take 1 tablet (25 mg total) by mouth every morning. 90 tablet 1  . latanoprost (XALATAN) 0.005 % ophthalmic solution Place 1 drop into both eyes at bedtime.     . metoprolol succinate (TOPROL-XL) 50 MG 24 hr tablet TAKE 1 TABLET BY MOUTH DAILY WITH OR IMMEDIATELY FOLLOWING A MEAL 90 tablet 1  . nitroGLYCERIN (NITROSTAT) 0.4 MG SL tablet Place 1 tablet (0.4 mg total) under the tongue every 5 (five) minutes as needed for chest pain (maxium 3 tablets daily). Do not take within 36 hrs of Cialis use 25 tablet 2  . pantoprazole (PROTONIX) 40 MG tablet TAKE 1 TABLET(40 MG) BY MOUTH EVERY MORNING 90 tablet 1  . potassium chloride SA (K-DUR) 20 MEQ tablet TAKE 1 TABLET BY MOUTH DAILY  90 tablet 1  . Zinc Acetate 25 MG CAPS Take 50 mg by mouth daily.     No current facility-administered medications for this visit.     Patient confirms/reports the following allergies:  No Known Allergies  No orders of the defined types were placed in this encounter.   AUTHORIZATION INFORMATION Primary Insurance: [employer] Medicare,  ID [id number] ,  Group #: [id number] Pre-Cert / Auth required: No, not required  SCHEDULE INFORMATION: Procedure has been scheduled as follows:  Date: 08/04/2019, Time: 12:30 Location: [facility] with Dr. Oneida Alar  This Gastroenterology Pre-Precedure Review Form is being routed to the following provider(s): Neil Crouch, PA-C

## 2019-06-23 ENCOUNTER — Encounter: Payer: Self-pay | Admitting: *Deleted

## 2019-06-23 NOTE — Progress Notes (Signed)
OK to schedule

## 2019-06-24 ENCOUNTER — Other Ambulatory Visit: Payer: Self-pay | Admitting: Family Medicine

## 2019-06-26 NOTE — Addendum Note (Signed)
Addended by: Metro Kung on: 06/26/2019 08:24 AM   Modules accepted: Orders, SmartSet

## 2019-06-28 DIAGNOSIS — L820 Inflamed seborrheic keratosis: Secondary | ICD-10-CM | POA: Diagnosis not present

## 2019-06-28 DIAGNOSIS — X32XXXD Exposure to sunlight, subsequent encounter: Secondary | ICD-10-CM | POA: Diagnosis not present

## 2019-06-28 DIAGNOSIS — B078 Other viral warts: Secondary | ICD-10-CM | POA: Diagnosis not present

## 2019-06-28 DIAGNOSIS — L570 Actinic keratosis: Secondary | ICD-10-CM | POA: Diagnosis not present

## 2019-08-02 ENCOUNTER — Other Ambulatory Visit: Payer: Self-pay

## 2019-08-02 ENCOUNTER — Other Ambulatory Visit (HOSPITAL_COMMUNITY)
Admission: RE | Admit: 2019-08-02 | Discharge: 2019-08-02 | Disposition: A | Discharge status: Home / Self Care | Payer: Medicare Other | Source: Ambulatory Visit | Attending: Gastroenterology | Admitting: Gastroenterology

## 2019-08-02 DIAGNOSIS — Z20828 Contact with and (suspected) exposure to other viral communicable diseases: Secondary | ICD-10-CM | POA: Insufficient documentation

## 2019-08-02 DIAGNOSIS — Z01812 Encounter for preprocedural laboratory examination: Secondary | ICD-10-CM | POA: Diagnosis not present

## 2019-08-03 DIAGNOSIS — H40013 Open angle with borderline findings, low risk, bilateral: Secondary | ICD-10-CM | POA: Diagnosis not present

## 2019-08-03 LAB — SARS CORONAVIRUS 2 (TAT 6-24 HRS): SARS Coronavirus 2: NEGATIVE

## 2019-08-04 ENCOUNTER — Other Ambulatory Visit: Payer: Self-pay

## 2019-08-04 ENCOUNTER — Encounter (HOSPITAL_COMMUNITY): Payer: Self-pay | Admitting: *Deleted

## 2019-08-04 ENCOUNTER — Ambulatory Visit (HOSPITAL_COMMUNITY)
Admission: RE | Admit: 2019-08-04 | Discharge: 2019-08-04 | Disposition: A | Discharge status: Home / Self Care | Payer: Medicare Other | Attending: Gastroenterology | Admitting: Gastroenterology

## 2019-08-04 ENCOUNTER — Encounter (HOSPITAL_COMMUNITY)
Admission: RE | Disposition: A | Discharge status: Home / Self Care | Payer: Self-pay | Source: Home / Self Care | Attending: Gastroenterology

## 2019-08-04 DIAGNOSIS — D123 Benign neoplasm of transverse colon: Secondary | ICD-10-CM | POA: Insufficient documentation

## 2019-08-04 DIAGNOSIS — Q438 Other specified congenital malformations of intestine: Secondary | ICD-10-CM | POA: Insufficient documentation

## 2019-08-04 DIAGNOSIS — Z823 Family history of stroke: Secondary | ICD-10-CM | POA: Insufficient documentation

## 2019-08-04 DIAGNOSIS — K219 Gastro-esophageal reflux disease without esophagitis: Secondary | ICD-10-CM | POA: Diagnosis not present

## 2019-08-04 DIAGNOSIS — J449 Chronic obstructive pulmonary disease, unspecified: Secondary | ICD-10-CM | POA: Insufficient documentation

## 2019-08-04 DIAGNOSIS — R7301 Impaired fasting glucose: Secondary | ICD-10-CM | POA: Insufficient documentation

## 2019-08-04 DIAGNOSIS — K648 Other hemorrhoids: Secondary | ICD-10-CM | POA: Insufficient documentation

## 2019-08-04 DIAGNOSIS — D124 Benign neoplasm of descending colon: Secondary | ICD-10-CM | POA: Insufficient documentation

## 2019-08-04 DIAGNOSIS — Z8673 Personal history of transient ischemic attack (TIA), and cerebral infarction without residual deficits: Secondary | ICD-10-CM | POA: Diagnosis not present

## 2019-08-04 DIAGNOSIS — Z87891 Personal history of nicotine dependence: Secondary | ICD-10-CM | POA: Diagnosis not present

## 2019-08-04 DIAGNOSIS — I252 Old myocardial infarction: Secondary | ICD-10-CM | POA: Insufficient documentation

## 2019-08-04 DIAGNOSIS — Z1211 Encounter for screening for malignant neoplasm of colon: Secondary | ICD-10-CM

## 2019-08-04 DIAGNOSIS — E7800 Pure hypercholesterolemia, unspecified: Secondary | ICD-10-CM | POA: Insufficient documentation

## 2019-08-04 DIAGNOSIS — Z7982 Long term (current) use of aspirin: Secondary | ICD-10-CM | POA: Insufficient documentation

## 2019-08-04 DIAGNOSIS — K644 Residual hemorrhoidal skin tags: Secondary | ICD-10-CM | POA: Insufficient documentation

## 2019-08-04 DIAGNOSIS — K5730 Diverticulosis of large intestine without perforation or abscess without bleeding: Secondary | ICD-10-CM | POA: Insufficient documentation

## 2019-08-04 DIAGNOSIS — Z955 Presence of coronary angioplasty implant and graft: Secondary | ICD-10-CM | POA: Diagnosis not present

## 2019-08-04 DIAGNOSIS — Z79899 Other long term (current) drug therapy: Secondary | ICD-10-CM | POA: Diagnosis not present

## 2019-08-04 DIAGNOSIS — K635 Polyp of colon: Secondary | ICD-10-CM

## 2019-08-04 DIAGNOSIS — I10 Essential (primary) hypertension: Secondary | ICD-10-CM | POA: Diagnosis not present

## 2019-08-04 DIAGNOSIS — Z8249 Family history of ischemic heart disease and other diseases of the circulatory system: Secondary | ICD-10-CM | POA: Insufficient documentation

## 2019-08-04 DIAGNOSIS — D122 Benign neoplasm of ascending colon: Secondary | ICD-10-CM | POA: Diagnosis not present

## 2019-08-04 HISTORY — PX: POLYPECTOMY: SHX5525

## 2019-08-04 HISTORY — PX: COLONOSCOPY: SHX5424

## 2019-08-04 SURGERY — COLONOSCOPY
Anesthesia: Moderate Sedation

## 2019-08-04 MED ORDER — MEPERIDINE HCL 100 MG/ML IJ SOLN
INTRAMUSCULAR | Status: DC | PRN
  Administered 2019-08-04 (×2): 25 mg via INTRAVENOUS

## 2019-08-04 MED ORDER — MIDAZOLAM HCL 5 MG/5ML IJ SOLN
INTRAMUSCULAR | Status: AC
  Filled 2019-08-04: qty 10

## 2019-08-04 MED ORDER — MEPERIDINE HCL 100 MG/ML IJ SOLN
INTRAMUSCULAR | Status: AC
  Filled 2019-08-04: qty 2

## 2019-08-04 MED ORDER — MIDAZOLAM HCL 5 MG/5ML IJ SOLN
INTRAMUSCULAR | Status: DC | PRN
  Administered 2019-08-04: 1 mg via INTRAVENOUS
  Administered 2019-08-04 (×2): 2 mg via INTRAVENOUS

## 2019-08-04 MED ORDER — SODIUM CHLORIDE 0.9 % IV SOLN
INTRAVENOUS | Status: DC
  Administered 2019-08-04: 12:00:00 via INTRAVENOUS

## 2019-08-04 NOTE — H&P (Signed)
Primary Care Physician:  Mikey Kirschner, MD Primary Gastroenterologist:  Dr. Oneida Alar  Pre-Procedure History & Physical: HPI:  Mitchell Oliver is a 73 y.o. male here for COLON CANCER SCREENING.  Past Medical History:  Diagnosis Date  . Allergy   . COPD (chronic obstructive pulmonary disease) ([place])   . CVA (cerebral vascular accident) ([place]) 2012   denies residual on 07/22/2015  . ED (erectile dysfunction)   . Family history of adverse reaction to anesthesia    "think my mother had real bad headaches after anesthesia"  . GERD (gastroesophageal reflux disease)   . Hypercholesterolemia   . Hypertension   . IFG (impaired fasting glucose)   . NSTEMI (non-ST elevated myocardial infarction) ([place])   . Pneumonia ~ 2005    Past Surgical History:  Procedure Laterality Date  . APPENDECTOMY  2009 [facility]  . BACK SURGERY    . BIOPSY  05/04/2014   Procedure: BIOPSY;  Surgeon: Danie Binder, MD;  [facility];  Service: Endoscopy;;  . CARDIAC CATHETERIZATION N/A 07/18/2015   Procedure: Left Heart Cath and Coronary Angiography;  Surgeon: Belva Crome, MD;  Location: [facility];  Service: Cardiovascular;  Laterality: N/A;  . CARDIAC CATHETERIZATION N/A 07/18/2015   Procedure: Coronary Stent Intervention;  Surgeon: Belva Crome, MD;  Location: [facility];  Service: Cardiovascular;  Laterality: N/A;  . CARDIAC CATHETERIZATION N/A 07/22/2015   Procedure: Coronary Stent Intervention;  Surgeon: Belva Crome, MD;  Location: [facility];  Service: Cardiovascular;  Laterality: N/A;  . COLONOSCOPY  2009  . ESOPHAGOGASTRODUODENOSCOPY N/A 05/04/2014   Procedure: ESOPHAGOGASTRODUODENOSCOPY (EGD);  Surgeon: Danie Binder, MD;  [facility]: [facility];  Service: Endoscopy;  Laterality: N/A;  9:15  . POSTERIOR LAMINECTOMY / DECOMPRESSION LUMBAR SPINE  06/03/2015  . SAVORY DILATION N/A 05/04/2014   Procedure: SAVORY DILATION;  Surgeon: Danie Binder, MD;  Location: [facility];   Service: Endoscopy;  Laterality: N/A;    Prior to Admission medications   Medication Sig Start Date End Date Taking? Authorizing Provider  amLODipine (NORVASC) 5 MG tablet TAKE 1 TABLET BY MOUTH EVERY DAY 06/27/19  Yes Mikey Kirschner, MD  Ascorbic Acid (VITAMIN C PO) Take 4 tablets by mouth daily. Takes 4000 mg daily   Yes [provider]  aspirin EC 81 MG tablet Take 81 mg by mouth daily.   Yes [provider]  atorvastatin (LIPITOR) 80 MG tablet Take one tablet daily 03/31/19  Yes Mikey Kirschner, MD  benazepril (LOTENSIN) 20 MG tablet TAKE 1 TABLET BY MOUTH EVERY DAY 03/31/19  Yes Mikey Kirschner, MD  clopidogrel (PLAVIX) 75 MG tablet Take 1 tablet (75 mg total) by mouth daily. Please keep upcoming appt in July with Dr. Tamala Julian for future refills. Thank you 02/20/19  Yes Belva Crome, MD  Cyanocobalamin (VITAMIN B-12 CR) 1000 MCG TBCR Take 1 tablet by mouth daily.   Yes [provider]  fish oil-omega-3 fatty acids 1000 MG capsule Take 2 g by mouth daily.   Yes [provider]  hydrochlorothiazide (HYDRODIURIL) 25 MG tablet Take 1 tablet (25 mg total) by mouth every morning. 03/31/19  Yes Mikey Kirschner, MD  latanoprost (XALATAN) 0.005 % ophthalmic solution Place 1 drop into both eyes at bedtime.  07/22/15  Yes [provider]  metoprolol succinate (TOPROL-XL) 50 MG 24 hr tablet TAKE 1 TABLET BY MOUTH DAILY WITH OR IMMEDIATELY FOLLOWING A MEAL 03/31/19  Yes Mikey Kirschner, MD  pantoprazole (PROTONIX) 40 MG tablet TAKE 1 TABLET(40 MG) BY MOUTH EVERY MORNING 03/31/19  Yes Mikey Kirschner, MD  potassium chloride SA (K-DUR) 20 MEQ tablet TAKE 1 TABLET BY MOUTH DAILY 04/13/19  Yes Mikey Kirschner, MD  Zinc Acetate 50 MG CAPS Take 50 mg by mouth daily.    Yes [provider]  CIALIS 20 MG tablet TAKE 1 TABLET BY MOUTH DAILY IF NEEDED FOR ERECTILE DYSFUNCTION, TAKE 2 HOURS TO INTERCOURSE Patient taking differently: as needed.  02/11/17   Mikey Kirschner, MD  fluticasone (FLONASE) 50 MCG/ACT nasal spray Place 1 spray into both nostrils daily as needed for allergies or rhinitis.     [provider]  nitroGLYCERIN (NITROSTAT) 0.4 MG SL tablet Place 1 tablet (0.4 mg total) under the tongue every 5 (five) minutes as needed for chest pain (maxium 3 tablets daily). Do not take within 36 hrs of Cialis use 12/21/16   Belva Crome, MD    Allergies as of 06/26/2019  . (No Known Allergies)    Family History  Problem Relation Age of Onset  . Hypertension Mother   . Hypertension Father   . Heart attack Father   . Stroke Father   . Colon cancer Neg Hx   . Colon polyps Neg Hx     Social History   Socioeconomic History  . Marital status: Divorced    Spouse name: Not on file  . Number of children: Not on file  . Years of education: Not on file  . Highest education level: Not on file  Occupational History  . Not on file  Social Needs  . Financial resource strain: Not on file  . Food insecurity    Worry: Not on file    Inability: Not on file  . Transportation needs    Medical: Not on file    Non-medical: Not on file  Tobacco Use  . Smoking status: Former Smoker    Packs/day: 2.00    Years: 24.00    Pack years: 48.00    Types: Cigarettes    Quit date: 12/24/1985    Years since quitting: 33.6  . Smokeless tobacco: Never Used  Substance and Sexual Activity  . Alcohol use: Yes    Alcohol/week: 6.0 standard drinks    Types: 4 Cans of beer, 2 Glasses of wine per week  . Drug use: No  . Sexual activity: Yes  Lifestyle  . Physical activity    Days per week: Not on file    Minutes per session: Not on file  . Stress: Not on file  Relationships  . Social Herbalist on phone: Not on file    Gets together: Not on file    Attends religious service: Not on file    Active member of club or organization: Not on file    Attends meetings of clubs or organizations: Not on file    Relationship status: Not on file   . Intimate partner violence    Fear of current or ex partner: Not on file    Emotionally abused: Not on file    Physically abused: Not on file    Forced sexual activity: Not on file  Other Topics Concern  . Not on file  Social History Narrative  . Not on file    Review of Systems: See HPI, otherwise negative ROS   Physical Exam: BP (!) 159/77   Pulse 70  Temp 97.8 F (36.6 C) (Oral)   Resp (!) 22   Ht 6\' 2"  (1.88 m)   Wt 111.1 kg   SpO2 96%   BMI 31.46 kg/m  General:   Alert,  pleasant and cooperative in NAD Head:  Normocephalic and atraumatic. Neck:  Supple; Lungs:  Clear throughout to auscultation.    Heart:  Regular rate and rhythm. Abdomen:  Soft, nontender and nondistended. Normal bowel sounds, without guarding, and without rebound.   Neurologic:  Alert and  oriented x4;  grossly normal neurologically.  Impression/Plan:    SCREENING  Plan:  1. TCS TODAY DISCUSSED PROCEDURE, BENEFITS, & RISKS: < 1% chance of medication reaction, bleeding, perforation, ASPIRATION, or rupture of spleen/liver requiring surgery to fix it and missed polyps < 1 cm 10-20% of the time.

## 2019-08-04 NOTE — Discharge Instructions (Signed)
You have small internal hemorrhoids and diverticulosis IN YOUR LEFT AND RIGHT COLON. YOU HAD SIX POLYPS REMOVED.    DRINK WATER TO KEEP YOUR URINE LIGHT YELLOW.  CONTINUE YOUR WEIGHT LOSS EFFORTS. YOUR BODY MASS INDEX IS OVER 30 WHICH MEANS YOU ARE OBESE. OBESITY IS ASSOCIATED WITH AN INCREASE FOR CIRRHOSIS, POLYPS, AND ALL CANCERS, INCLUDING ESOPHAGEAL AND COLON CANCER.  FOLLOW A HIGH FIBER DIET. AVOID ITEMS THAT CAUSE BLOATING. See info below.    YOUR BIOPSY RESULTS WILL BE BACK IN 5 BUSINESS DAYS.  USE PREPARATION H FOUR TIMES  A DAY IF NEEDED TO RELIEVE RECTAL PAIN/PRESSURE/BLEEDING.  We do not routinely screen for polyps after the age of 64.  Colonoscopy Care After Read the instructions outlined below and refer to this sheet in the next week. These discharge instructions provide you with general information on caring for yourself after you leave the hospital. While your treatment has been planned according to the most current medical practices available, unavoidable complications occasionally occur. If you have any problems or questions after discharge, call DR. Arpi Diebold, [phone].  ACTIVITY  You may resume your regular activity, but move at a slower pace for the next 24 hours.   Take frequent rest periods for the next 24 hours.   Walking will help get rid of the air and reduce the bloated feeling in your belly (abdomen).   No driving for 24 hours (because of the medicine (anesthesia) used during the test).   You may shower.   Do not sign any important legal documents or operate any machinery for 24 hours (because of the anesthesia used during the test).    NUTRITION  Drink plenty of fluids.   You may resume your normal diet as instructed by your doctor.   Begin with a light meal and progress to your normal diet. Heavy or fried foods are harder to digest and may make you feel sick to your stomach (nauseated).   Avoid alcoholic beverages for 24 hours or as  instructed.    MEDICATIONS  You may resume your normal medications.   WHAT YOU CAN EXPECT TODAY  Some feelings of bloating in the abdomen.   Passage of more gas than usual.   Spotting of blood in your stool or on the toilet paper  .  IF YOU HAD POLYPS REMOVED DURING THE COLONOSCOPY:  Eat a soft diet IF YOU HAVE NAUSEA, BLOATING, ABDOMINAL PAIN, OR VOMITING.    FINDING OUT THE RESULTS OF YOUR TEST Not all test results are available during your visit. DR. Oneida Alar WILL CALL YOU WITHIN 14 DAYS OF YOUR PROCEDUE WITH YOUR RESULTS. Do not assume everything is normal if you have not heard from DR. Markella Dao, CALL HER OFFICE AT [phone].  SEEK IMMEDIATE MEDICAL ATTENTION AND CALL THE OFFICE: [phone] IF:  You have more than a spotting of blood in your stool.   Your belly is swollen (abdominal distention).   You are nauseated or vomiting.   You have a temperature over 101F.   You have abdominal pain or discomfort that is severe or gets worse throughout the day.  High-Fiber Diet A high-fiber diet changes your normal diet to include more whole grains, legumes, fruits, and vegetables. Changes in the diet involve replacing refined carbohydrates with unrefined foods. The calorie level of the diet is essentially unchanged. The Dietary Reference Intake (recommended amount) for adult males is 38 grams per day. For adult females, it is 25 grams per day. Pregnant and lactating women should consume  28 grams of fiber per day. Fiber is the intact part of a plant that is not broken down during digestion. Functional fiber is fiber that has been isolated from the plant to provide a beneficial effect in the body.  PURPOSE  Increase stool bulk.   Ease and regulate bowel movements.   Lower cholesterol.   REDUCE RISK OF COLON CANCER  INDICATIONS THAT YOU NEED MORE FIBER  Constipation and hemorrhoids.   Uncomplicated diverticulosis (intestine condition) and irritable bowel syndrome.    Weight management.   As a protective measure against hardening of the arteries (atherosclerosis), diabetes, and cancer.   GUIDELINES FOR INCREASING FIBER IN THE DIET  Start adding fiber to the diet slowly. A gradual increase of about 5 more grams (2 servings of most fruits or vegetables) per day is best. Too rapid an increase in fiber may result in constipation, flatulence, and bloating.   Drink enough water and fluids to keep your urine clear or pale yellow. Water, juice, or caffeine-free drinks are recommended. Not drinking enough fluid may cause constipation.   Eat a variety of high-fiber foods rather than one type of fiber.   Try to increase your intake of fiber through using high-fiber foods rather than fiber pills or supplements that contain small amounts of fiber.   The goal is to change the types of food eaten. Do not supplement your present diet with high-fiber foods, but replace foods in your present diet.   Polyps, Colon  A polyp is extra tissue that grows inside your body. Colon polyps grow in the large intestine. The large intestine, also called the colon, is part of your digestive system. It is a long, hollow tube at the end of your digestive tract where your body makes and stores stool. Most polyps are not dangerous. They are benign. This means they are not cancerous. But over time, some types of polyps can turn into cancer. Polyps that are smaller than a pea are usually not harmful. But larger polyps could someday become or may already be cancerous. To be safe, doctors remove all polyps and test them.   PREVENTION There is not one sure way to prevent polyps. You might be able to lower your risk of getting them if you:  Eat more fruits and vegetables and less fatty food.   Do not smoke.   Avoid alcohol.   Exercise every day.   Lose weight if you are overweight.   Eating more calcium and folate can also lower your risk of getting polyps. Some foods that are rich in  calcium are milk, cheese, and broccoli. Some foods that are rich in folate are chickpeas, kidney beans, and spinach.    Diverticulosis Diverticulosis is a common condition that develops when small pouches (diverticula) form in the wall of the colon. The risk of diverticulosis increases with age. It happens more often in people who eat a low-fiber diet. Most individuals with diverticulosis have no symptoms. Those individuals with symptoms usually experience belly (abdominal) pain, constipation, or loose stools (diarrhea).  HOME CARE INSTRUCTIONS  Increase the amount of fiber in your diet as directed by your caregiver or dietician. This may reduce symptoms of diverticulosis.   Drink at least 6 to 8 glasses of water each day to prevent constipation.   Try not to strain when you have a bowel movement.   Avoiding nuts and seeds to prevent complications is NOT NECESSARY.   FOODS HAVING HIGH FIBER CONTENT INCLUDE:  Fruits. Apple, peach, pear,  tangerine, raisins, prunes.   Vegetables. Brussels sprouts, asparagus, broccoli, cabbage, carrot, cauliflower, romaine lettuce, spinach, summer squash, tomato, winter squash, zucchini.   Starchy Vegetables. Baked beans, kidney beans, lima beans, split peas, lentils, potatoes (with skin).    SEEK IMMEDIATE MEDICAL CARE IF:  You develop increasing pain or severe bloating.   You have an oral temperature above 101F.   You develop vomiting or bowel movements that are bloody or black.

## 2019-08-04 NOTE — Op Note (Signed)
[facility] Patient Name: Mitchell Oliver Procedure Date: 08/04/2019 12:11 PM MRN: [id number] Date of Birth: 01-24-1946 Attending MD: Barney Drain MD, MD CSN: [id number] Age: 73 Admit Type: Outpatient Procedure:                Colonoscopy WITH COLD SNARE POLYPECTOMY Indications:              Screening for colorectal malignant neoplasm Providers:                Barney Drain MD, MD, Charlsie Quest. Theda Sers RN, RN,                            Nelma Rothman, Technician Referring MD:             Rosemary Holms, MD Medicines:                Meperidine 50 mg IV, Midazolam 5 mg IV Complications:            No immediate complications. Estimated Blood Loss:     Estimated blood loss was minimal. Procedure:                Pre-Anesthesia Assessment:                           - Prior to the procedure, a History and Physical                            was performed, and patient medications and                            allergies were reviewed. The patient's tolerance of                            previous anesthesia was also reviewed. The risks                            and benefits of the procedure and the sedation                            options and risks were discussed with the patient.                            All questions were answered, and informed consent                            was obtained. Prior Anticoagulants: The patient has                            taken Plavix (clopidogrel), last dose was day of                            procedure. ASA Grade Assessment: II - A patient                            with mild systemic disease. After reviewing the  risks and benefits, the patient was deemed in                            satisfactory condition to undergo the procedure.                            After obtaining informed consent, the colonoscope                            was passed under direct vision. Throughout the                            procedure, the  patient's blood pressure, pulse, and                            oxygen saturations were monitored continuously. The                            [id number]) scope was introduced through                            the anus and advanced to the the cecum, identified                            by appendiceal orifice and ileocecal valve. The                            colonoscopy was somewhat difficult due to a                            tortuous colon. Successful completion of the                            procedure was aided by straightening and shortening                            the scope to obtain bowel loop reduction and                            COLOWRAP. The patient tolerated the procedure well.                            The quality of the bowel preparation was excellent.                            The ileocecal valve, appendiceal orifice, and                            rectum were photographed. Scope In: 1:05:33 PM Scope Out: 1:28:00 PM Scope Withdrawal Time: 0 hours 18 minutes 17 seconds  Total Procedure Duration: 0 hours 22 minutes 27 seconds  Findings:      Six sessile polyps were found in the descending colon, splenic flexure,       transverse colon,  hepatic flexure and ascending colon. The polyps were 2       to 5 mm in size. These polyps were removed with a cold snare. Resection       and retrieval were complete.      Multiple small and large-mouthed diverticula were found in the       recto-sigmoid colon, sigmoid colon, transverse colon and ascending colon.      The recto-sigmoid colon and sigmoid colon were moderately tortuous.      External and internal hemorrhoids were found. Impression:               - Six 2 to 5 mm polyps in the descending colon, at                            the splenic flexure, in the transverse colon, at                            the hepatic flexure and in the ascending colon,                            removed with a cold snare. Resected  and retrieved.                           - Diverticulosis in the recto-sigmoid colon, in the                            sigmoid colon, in the transverse colon and in the                            ascending colon.                           - Tortuous colon.                           - External and internal hemorrhoids. Moderate Sedation:      Moderate (conscious) sedation was administered by the endoscopy nurse       and supervised by the endoscopist. The following parameters were       monitored: oxygen saturation, heart rate, blood pressure, and response       to care. Total physician intraservice time was 38 minutes. Recommendation:           - Patient has a contact number available for                            emergencies. The signs and symptoms of potential                            delayed complications were discussed with the                            patient. Return to normal activities tomorrow.                            Written discharge instructions were provided to the  patient.                           - High fiber diet.                           - Continue present medications.                           - Await pathology results.                           - Repeat colonoscopy is not recommended due to                            current age (67 years or older) for surveillance. Procedure Code(s):        --- Professional ---                           [phone], Colonoscopy, flexible; with removal of                            tumor(s), polyp(s), or other lesion(s) by snare                            technique                           99153, Moderate sedation; each additional 15                            minutes intraservice time                           99153, Moderate sedation; each additional 15                            minutes intraservice time                           G0500, Moderate sedation services provided by the                             same physician or other qualified health care                            professional performing a gastrointestinal                            endoscopic service that sedation supports,                            requiring the presence of an independent trained                            observer to assist in the monitoring of the  patient's level of consciousness and physiological                            status; initial 15 minutes of intra-service time;                            patient age 38 years or older (additional time may                            be reported with [phone], as appropriate) Diagnosis Code(s):        --- Professional ---                           Z12.11, Encounter for screening for malignant                            neoplasm of colon                           K63.5, Polyp of colon                           K64.8, Other hemorrhoids                           K57.30, Diverticulosis of large intestine without                            perforation or abscess without bleeding                           Q43.8, Other specified congenital malformations of                            intestine CPT copyright 2019 [employer]. All rights reserved. The codes documented in this report are preliminary and upon coder review may  be revised to meet current compliance requirements. Barney Drain, MD Barney Drain MD, MD 08/04/2019 2:02:35 PM This report has been signed electronically. Number of Addenda: 0

## 2019-08-07 LAB — SURGICAL PATHOLOGY

## 2019-08-07 NOTE — Progress Notes (Signed)
CC'D TO PCP °

## 2019-08-07 NOTE — Progress Notes (Signed)
cc'd to pcp 

## 2019-08-11 ENCOUNTER — Encounter (HOSPITAL_COMMUNITY): Payer: Self-pay | Admitting: Gastroenterology

## 2019-08-14 ENCOUNTER — Other Ambulatory Visit: Payer: Self-pay | Admitting: Interventional Cardiology

## 2019-08-14 MED ORDER — CLOPIDOGREL BISULFATE 75 MG PO TABS: 75.0000 mg | ORAL_TABLET | Freq: Every day | ORAL | 2 refills | Status: DC

## 2019-08-29 DIAGNOSIS — M5416 Radiculopathy, lumbar region: Secondary | ICD-10-CM | POA: Diagnosis not present

## 2019-09-11 ENCOUNTER — Other Ambulatory Visit: Payer: Self-pay | Admitting: Family Medicine

## 2019-09-17 ENCOUNTER — Other Ambulatory Visit: Payer: Self-pay | Admitting: Family Medicine

## 2019-09-21 ENCOUNTER — Other Ambulatory Visit: Payer: Self-pay | Admitting: Family Medicine

## 2019-09-22 ENCOUNTER — Other Ambulatory Visit: Payer: Self-pay | Admitting: Family Medicine

## 2019-09-24 ENCOUNTER — Other Ambulatory Visit: Payer: Self-pay | Admitting: Family Medicine

## 2019-10-02 ENCOUNTER — Other Ambulatory Visit: Payer: Self-pay

## 2019-10-02 ENCOUNTER — Ambulatory Visit (INDEPENDENT_AMBULATORY_CARE_PROVIDER_SITE_OTHER): Payer: Medicare Other | Admitting: Family Medicine

## 2019-10-02 ENCOUNTER — Encounter: Payer: Self-pay | Admitting: Family Medicine

## 2019-10-02 DIAGNOSIS — E785 Hyperlipidemia, unspecified: Secondary | ICD-10-CM

## 2019-10-02 DIAGNOSIS — Z8673 Personal history of transient ischemic attack (TIA), and cerebral infarction without residual deficits: Secondary | ICD-10-CM

## 2019-10-02 DIAGNOSIS — I10 Essential (primary) hypertension: Secondary | ICD-10-CM

## 2019-10-02 DIAGNOSIS — R7303 Prediabetes: Secondary | ICD-10-CM | POA: Diagnosis not present

## 2019-10-02 MED ORDER — HYDROCHLOROTHIAZIDE 25 MG PO TABS: ORAL_TABLET | ORAL | 1 refills | Status: DC

## 2019-10-02 MED ORDER — POTASSIUM CHLORIDE CRYS ER 20 MEQ PO TBCR: 20.0000 meq | EXTENDED_RELEASE_TABLET | Freq: Every day | ORAL | 1 refills | Status: DC

## 2019-10-02 MED ORDER — ATORVASTATIN CALCIUM 80 MG PO TABS: ORAL_TABLET | ORAL | 1 refills | Status: DC

## 2019-10-02 MED ORDER — METOPROLOL SUCCINATE ER 50 MG PO TB24: ORAL_TABLET | ORAL | 1 refills | Status: DC

## 2019-10-02 MED ORDER — PANTOPRAZOLE SODIUM 40 MG PO TBEC: DELAYED_RELEASE_TABLET | ORAL | 1 refills | Status: DC

## 2019-10-02 MED ORDER — AMLODIPINE BESYLATE 5 MG PO TABS: ORAL_TABLET | ORAL | 1 refills | Status: DC

## 2019-10-02 MED ORDER — BENAZEPRIL HCL 20 MG PO TABS: 20.0000 mg | ORAL_TABLET | Freq: Every day | ORAL | 1 refills | Status: DC

## 2019-10-02 NOTE — Progress Notes (Signed)
   Subjective:    Patient ID: Mitchell Oliver, male    DOB: Sep 24, 1946, 73 y.o.   MRN: [id number] Audio only Hypertension This is a chronic problem. (Running around 120/75-78) There are no compliance problems.   pt has began work and is walking about 2.5 miles a day.    Pt states his hearing has become worse. Pt has had to go to new audiologist and she recommended new hearing aids. Pt states that right ear has lots of wax. Pt states that audiologist recommended that PCP look at it and try to get wax out.   Virtual Visit via Telephone Note  I connected with Mitchell Oliver on 10/02/19 at  8:30 AM EST by telephone and verified that I am speaking with the correct person using two identifiers.  Location: Patient: home Provider: provider   I discussed the limitations, risks, security and privacy concerns of performing an evaluation and management service by telephone and the availability of in person appointments. I also discussed with the patient that there may be a patient responsible charge related to this service. The patient expressed understanding and agreed to proceed.   History of Present Illness:    Observations/Objective:   Assessment and Plan:   Follow Up Instructions:    I discussed the assessment and treatment plan with the patient. The patient was provided an opportunity to ask questions and all were answered. The patient agreed with the plan and demonstrated an understanding of the instructions.   The patient was advised to call back or seek an in-person evaluation if the symptoms worsen or if the condition fails to improve as anticipated.  I provided 25 minutes of non-face-to-face time during this encounter.  Blood pressure medicine and blood pressure levels reviewed today with patient. Compliant with blood pressure medicine. States does not miss a dose. No obvious side effects. Blood pressure generally good when checked elsewhere. Watching salt intake.   Patient  continues to take lipid medication regularly. No obvious side effects from it. Generally does not miss a dose. Prior blood work results are reviewed with patient. Patient continues to work on fat intake in diet  Patient has known coronary artery disease.  Exercising a fair amount  Also known COPD.  Doing his best to avoid for COVID-19.  Compliant with medications as always   Vicente Males, LPN  Review of Systems No headache, no major weight loss or weight gain, no chest pain no back pain abdominal pain no change in bowel habits complete ROS otherwise negative     Objective:   Physical Exam  Virtual      Assessment & Plan:  Impression hypertension good control discussed multiple good blood pressures recent  2.  COVID-19 precautions, brief discussion held.  Patient already covered with flu shot  3.  Hyperlipidemia.  Prior blood work excellent.  We will hold off on blood work this cycle rationale discussed  4.  Potential cerumen ear.  No longer irrigate here rationale discussed.  If persists after audiologist visit patient to call back we will work on setting up Dr. Lorelee Cover to take care of this  5.  History of stroke  6 months worth diet exercise discussed

## 2019-10-04 ENCOUNTER — Other Ambulatory Visit: Payer: Self-pay

## 2019-10-04 ENCOUNTER — Ambulatory Visit (INDEPENDENT_AMBULATORY_CARE_PROVIDER_SITE_OTHER): Payer: Medicare Other | Admitting: Otolaryngology

## 2019-10-04 ENCOUNTER — Encounter (INDEPENDENT_AMBULATORY_CARE_PROVIDER_SITE_OTHER): Payer: Self-pay | Admitting: Otolaryngology

## 2019-10-04 VITALS — Temp 97.7°F

## 2019-10-04 DIAGNOSIS — H6121 Impacted cerumen, right ear: Secondary | ICD-10-CM | POA: Diagnosis not present

## 2019-10-04 NOTE — Progress Notes (Signed)
HPI: Mitchell Oliver is a 73 y.o. male who presents for evaluation of right ear obstruction.  He has bilateral hearing aids he has had for several years but he lost them on the right side.  He tried cleaning the right ear canal with eardrops as well as Q-tips but was unsuccessful and is referred here by [facility] to have the right ear canal cleaned..  Past Medical History:  Diagnosis Date  . Allergy   . COPD (chronic obstructive pulmonary disease) ([place])   . CVA (cerebral vascular accident) ([place]) 2012   denies residual on 07/22/2015  . ED (erectile dysfunction)   . Family history of adverse reaction to anesthesia    "think my mother had real bad headaches after anesthesia"  . GERD (gastroesophageal reflux disease)   . Hypercholesterolemia   . Hypertension   . IFG (impaired fasting glucose)   . NSTEMI (non-ST elevated myocardial infarction) ([place])   . Pneumonia ~ 2005   Past Surgical History:  Procedure Laterality Date  . APPENDECTOMY  2009 [facility]  . BACK SURGERY    . BIOPSY  05/04/2014   Procedure: BIOPSY;  Surgeon: Danie Binder, MD;  [facility];  Service: Endoscopy;;  . CARDIAC CATHETERIZATION N/A 07/18/2015   Procedure: Left Heart Cath and Coronary Angiography;  Surgeon: Belva Crome, MD;  Location: [place] [facility];  Service: Cardiovascular;  Laterality: N/A;  . CARDIAC CATHETERIZATION N/A 07/18/2015   Procedure: Coronary Stent Intervention;  Surgeon: Belva Crome, MD;  Location: [place] [facility];  Service: Cardiovascular;  Laterality: N/A;  . CARDIAC CATHETERIZATION N/A 07/22/2015   Procedure: Coronary Stent Intervention;  Surgeon: Belva Crome, MD;  Location: [place] [facility];  Service: Cardiovascular;  Laterality: N/A;  . COLONOSCOPY  2009  . COLONOSCOPY N/A 08/04/2019   Procedure: COLONOSCOPY;  Surgeon: Danie Binder, MD;  [facility];  Service: Endoscopy;  Laterality: N/A;  12:30  . ESOPHAGOGASTRODUODENOSCOPY N/A 05/04/2014   Procedure:  ESOPHAGOGASTRODUODENOSCOPY (EGD);  Surgeon: Danie Binder, MD;  [facility];  Service: Endoscopy;  Laterality: N/A;  9:15  . POLYPECTOMY  08/04/2019   Procedure: POLYPECTOMY;  Surgeon: Danie Binder, MD;  [facility];  Service: Endoscopy;;  hepatic flexure, ascending colon, transverse colon, splenic flexure, descending colon   . POSTERIOR LAMINECTOMY / DECOMPRESSION LUMBAR SPINE  06/03/2015  . SAVORY DILATION N/A 05/04/2014   Procedure: SAVORY DILATION;  Surgeon: Danie Binder, MD;  [facility];  Service: Endoscopy;  Laterality: N/A;   Social History   Socioeconomic History  . Marital status: Divorced    Spouse name: Not on file  . Number of children: Not on file  . Years of education: Not on file  . Highest education level: Not on file  Occupational History  . Not on file  Social Needs  . Financial resource strain: Not on file  . Food insecurity    Worry: Not on file    Inability: Not on file  . Transportation needs    Medical: Not on file    Non-medical: Not on file  Tobacco Use  . Smoking status: Former Smoker    Packs/day: 2.00    Years: 24.00    Pack years: 48.00    Types: Cigarettes    Start date: 1963    Quit date: 12/24/1985    Years since quitting: 33.8  . Smokeless tobacco: Never Used  Substance and Sexual Activity  .  Alcohol use: Yes    Alcohol/week: 6.0 standard drinks    Types: 2 Glasses of wine, 4 Cans of beer per week  . Drug use: No  . Sexual activity: Yes  Lifestyle  . Physical activity    Days per week: Not on file    Minutes per session: Not on file  . Stress: Not on file  Relationships  . Social Herbalist on phone: Not on file    Gets together: Not on file    Attends religious service: Not on file    Active member of club or organization: Not on file    Attends meetings of clubs or organizations: Not on file    Relationship status: Not on file  Other Topics Concern  . Not on file  Social History  Narrative  . Not on file   Family History  Problem Relation Age of Onset  . Hypertension Mother   . Hypertension Father   . Heart attack Father   . Stroke Father   . Colon cancer Neg Hx   . Colon polyps Neg Hx    No Known Allergies Prior to Admission medications   Medication Sig Start Date End Date Taking? Authorizing Provider  amLODipine (NORVASC) 5 MG tablet TAKE 1 TABLET(5 MG) BY MOUTH DAILY 10/02/19  Yes Mikey Kirschner, MD  Ascorbic Acid (VITAMIN C PO) Take 4 tablets by mouth daily. Takes 4000 mg daily   Yes [provider]  aspirin EC 81 MG tablet Take 81 mg by mouth daily.   Yes [provider]  atorvastatin (LIPITOR) 80 MG tablet TAKE 1 TABLET BY MOUTH DAILY 10/02/19  Yes Mikey Kirschner, MD  benazepril (LOTENSIN) 20 MG tablet Take 1 tablet (20 mg total) by mouth daily. 10/02/19  Yes Mikey Kirschner, MD  CIALIS 20 MG tablet TAKE 1 TABLET BY MOUTH DAILY IF NEEDED FOR ERECTILE DYSFUNCTION, TAKE 2 HOURS TO INTERCOURSE Patient taking differently: as needed.  02/11/17  Yes Mikey Kirschner, MD  clopidogrel (PLAVIX) 75 MG tablet Take 1 tablet (75 mg total) by mouth daily. 08/14/19  Yes Belva Crome, MD  Cyanocobalamin (VITAMIN B-12 CR) 1000 MCG TBCR Take 1 tablet by mouth daily.   Yes [provider]  fish oil-omega-3 fatty acids 1000 MG capsule Take 2 g by mouth daily.   Yes [provider]  fluticasone (FLONASE) 50 MCG/ACT nasal spray Place 1 spray into both nostrils daily as needed for allergies or rhinitis.    Yes [provider]  hydrochlorothiazide (HYDRODIURIL) 25 MG tablet TAKE 1 TABLET(25 MG) BY MOUTH EVERY MORNING 10/02/19  Yes Mikey Kirschner, MD  latanoprost (XALATAN) 0.005 % ophthalmic solution Place 1 drop into both eyes at bedtime.  07/22/15  Yes [provider]  metoprolol succinate (TOPROL-XL) 50 MG 24 hr tablet Take with or immediately following a meal. 10/02/19  Yes Mikey Kirschner, MD  nitroGLYCERIN  (NITROSTAT) 0.4 MG SL tablet Place 1 tablet (0.4 mg total) under the tongue every 5 (five) minutes as needed for chest pain (maxium 3 tablets daily). Do not take within 36 hrs of Cialis use 12/21/16  Yes Belva Crome, MD  pantoprazole (PROTONIX) 40 MG tablet TAKE 1 TABLET(40 MG) BY MOUTH EVERY MORNING 10/02/19  Yes Mikey Kirschner, MD  potassium chloride SA (KLOR-CON) 20 MEQ tablet Take 1 tablet (20 mEq total) by mouth daily. 10/02/19  Yes Mikey Kirschner, MD  Zinc Acetate 50 MG CAPS Take  50 mg by mouth daily.    Yes [provider]     Positive ROS: Otherwise negative  All other systems have been reviewed and were otherwise negative with the exception of those mentioned in the HPI and as above.  Physical Exam: Constitutional: Alert, well-appearing, no acute distress Ears: External ears without lesions or tenderness. Ear canals left ear canal and TM are clear right ear canal reveals impacted cerumen down adjacent to the TM.  This was cleaned with suction.  Has a small abrasion of the posterior superior aspect of the TM from use of Q-tips but the TM is intact and clear otherwise.  AC > BC on the right side. Nasal: External nose without lesions. Clear nasal passages Oral: Oropharynx clear. Neck: No palpable adenopathy or masses Respiratory: Breathing comfortably  Skin: No facial/neck lesions or rash noted.  Cerumen impaction removal  Date/Time: 10/04/2019 8:44 AM Performed by: Rozetta Nunnery, MD Authorized by: Rozetta Nunnery, MD   Consent:    Consent obtained:  Verbal   Consent given by:  Patient   Risks discussed:  Pain and bleeding Procedure details:    Location:  R ear   Procedure type: suction   Post-procedure details:    Inspection:  TM intact and canal normal   Hearing quality:  Improved   Patient tolerance of procedure:  Tolerated well, no immediate complications Comments:     Patient had a small abrasion of the posterior superior right TM from  Q-tip use.    Assessment: Right ear cerumen impaction  Plan: This was cleaned in the office.  Cautioned him about Q-tip use. He will follow-up as needed  Radene Journey, MD

## 2019-11-30 ENCOUNTER — Encounter: Payer: Self-pay | Admitting: Family Medicine

## 2019-12-05 DIAGNOSIS — H40013 Open angle with borderline findings, low risk, bilateral: Secondary | ICD-10-CM | POA: Diagnosis not present

## 2019-12-17 ENCOUNTER — Other Ambulatory Visit: Payer: Self-pay | Admitting: Family Medicine

## 2019-12-21 ENCOUNTER — Other Ambulatory Visit: Payer: Self-pay | Admitting: Family Medicine

## 2019-12-27 ENCOUNTER — Other Ambulatory Visit: Payer: Self-pay | Admitting: Family Medicine

## 2019-12-27 DIAGNOSIS — L820 Inflamed seborrheic keratosis: Secondary | ICD-10-CM | POA: Diagnosis not present

## 2019-12-27 DIAGNOSIS — X32XXXD Exposure to sunlight, subsequent encounter: Secondary | ICD-10-CM | POA: Diagnosis not present

## 2019-12-27 DIAGNOSIS — L570 Actinic keratosis: Secondary | ICD-10-CM | POA: Diagnosis not present

## 2020-01-16 ENCOUNTER — Other Ambulatory Visit: Payer: Self-pay

## 2020-01-16 ENCOUNTER — Ambulatory Visit (INDEPENDENT_AMBULATORY_CARE_PROVIDER_SITE_OTHER): Payer: Medicare Other | Admitting: Family Medicine

## 2020-01-16 ENCOUNTER — Encounter: Payer: Self-pay | Admitting: Family Medicine

## 2020-01-16 VITALS — BP 138/82 | Temp 97.9°F | Ht 74.0 in | Wt 246.0 lb

## 2020-01-16 DIAGNOSIS — E785 Hyperlipidemia, unspecified: Secondary | ICD-10-CM

## 2020-01-16 DIAGNOSIS — I10 Essential (primary) hypertension: Secondary | ICD-10-CM | POA: Diagnosis not present

## 2020-01-16 DIAGNOSIS — Z79899 Other long term (current) drug therapy: Secondary | ICD-10-CM | POA: Diagnosis not present

## 2020-01-16 DIAGNOSIS — R040 Epistaxis: Secondary | ICD-10-CM | POA: Diagnosis not present

## 2020-01-16 DIAGNOSIS — Z125 Encounter for screening for malignant neoplasm of prostate: Secondary | ICD-10-CM

## 2020-01-16 NOTE — Progress Notes (Signed)
   Subjective:    Patient ID: Mitchell Oliver, male    DOB: 1946/06/21, 74 y.o.   MRN: [id number]  Epistaxis  The bleeding has been from the left nare. This is a new problem. Episode onset: this morning. He has tried pressure and nasal tampon for the symptoms.  pt states it took two and a half hours to stop the nose bleed this morning.  Nosebleed on the left side present over the past day lasted a couple hours unable to get it to stop but he did not squeeze or do strong compression patient denies bleeding anywhere else no abnormal bruising is on Plavix as well as 81 mg aspirin denies any other health issues currently   Review of Systems  HENT: Positive for nosebleeds.        Objective:   Physical Exam  Lungs clear respiratory rate normal heart regular no murmurs erythematous area on the left nostril region septum consistent with a nosebleed is not bleeding currently.  Eardrum normal.      Assessment & Plan:  Nosebleed Proper way to stop it with compression was shown Saline sprays frequently Vaseline at nighttime If ongoing troubles with this or problems recommend ENT referral Warning signs regarding when to go to ER was discussed  Patient is due for his regular lab work follow-up within the next few months

## 2020-01-18 DIAGNOSIS — R040 Epistaxis: Secondary | ICD-10-CM | POA: Diagnosis not present

## 2020-01-18 DIAGNOSIS — Z79899 Other long term (current) drug therapy: Secondary | ICD-10-CM | POA: Diagnosis not present

## 2020-01-18 DIAGNOSIS — I10 Essential (primary) hypertension: Secondary | ICD-10-CM | POA: Diagnosis not present

## 2020-01-18 DIAGNOSIS — E785 Hyperlipidemia, unspecified: Secondary | ICD-10-CM | POA: Diagnosis not present

## 2020-01-19 ENCOUNTER — Other Ambulatory Visit: Payer: Self-pay | Admitting: *Deleted

## 2020-01-19 ENCOUNTER — Telehealth: Payer: Self-pay | Admitting: Family Medicine

## 2020-01-19 DIAGNOSIS — E871 Hypo-osmolality and hyponatremia: Secondary | ICD-10-CM

## 2020-01-19 LAB — HEPATIC FUNCTION PANEL
ALT: 36 IU/L (ref 0–44)
AST: 34 IU/L (ref 0–40)
Albumin: 4.4 g/dL (ref 3.7–4.7)
Alkaline Phosphatase: 73 IU/L (ref 39–117)
Bilirubin Total: 1.3 mg/dL — ABNORMAL HIGH (ref 0.0–1.2)
Bilirubin, Direct: 0.36 mg/dL (ref 0.00–0.40)
Total Protein: 7.1 g/dL (ref 6.0–8.5)

## 2020-01-19 LAB — CBC WITH DIFFERENTIAL/PLATELET
Basophils Absolute: 0.1 10*3/uL (ref 0.0–0.2)
Basos: 1 %
EOS (ABSOLUTE): 0.3 10*3/uL (ref 0.0–0.4)
Eos: 3 %
Hematocrit: 44.7 % (ref 37.5–51.0)
Hemoglobin: 15.5 g/dL (ref 13.0–17.7)
Immature Grans (Abs): 0 10*3/uL (ref 0.0–0.1)
Immature Granulocytes: 0 %
Lymphocytes Absolute: 2.7 10*3/uL (ref 0.7–3.1)
Lymphs: 27 %
MCH: 30.2 pg (ref 26.6–33.0)
MCHC: 34.7 g/dL (ref 31.5–35.7)
MCV: 87 fL (ref 79–97)
Monocytes Absolute: 0.8 10*3/uL (ref 0.1–0.9)
Monocytes: 8 %
Neutrophils Absolute: 6 10*3/uL (ref 1.4–7.0)
Neutrophils: 61 %
Platelets: 278 10*3/uL (ref 150–450)
RBC: 5.14 x10E6/uL (ref 4.14–5.80)
RDW: 12.4 % (ref 11.6–15.4)
WBC: 9.8 10*3/uL (ref 3.4–10.8)

## 2020-01-19 LAB — BASIC METABOLIC PANEL
BUN/Creatinine Ratio: 9 — ABNORMAL LOW (ref 10–24)
BUN: 11 mg/dL (ref 8–27)
CO2: 17 mmol/L — ABNORMAL LOW (ref 20–29)
Calcium: 9.7 mg/dL (ref 8.6–10.2)
Chloride: 96 mmol/L (ref 96–106)
Creatinine, Ser: 1.18 mg/dL (ref 0.76–1.27)
GFR calc Af Amer: 69 mL/min/{1.73_m2} (ref >59)
GFR calc non Af Amer: 60 mL/min/{1.73_m2} (ref >59)
Glucose: 127 mg/dL — ABNORMAL HIGH (ref 65–99)
Potassium: 4.5 mmol/L (ref 3.5–5.2)
Sodium: 129 mmol/L — ABNORMAL LOW (ref 134–144)

## 2020-01-19 LAB — LIPID PANEL
Chol/HDL Ratio: 2.8 ratio (ref 0.0–5.0)
Cholesterol, Total: 112 mg/dL (ref 100–199)
HDL: 40 mg/dL (ref >39)
LDL Chol Calc (NIH): 53 mg/dL (ref 0–99)
Triglycerides: 97 mg/dL (ref 0–149)
VLDL Cholesterol Cal: 19 mg/dL (ref 5–40)

## 2020-01-19 LAB — PSA: Prostate Specific Ag, Serum: 0.7 ng/mL (ref 0.0–4.0)

## 2020-01-19 NOTE — Telephone Encounter (Signed)
Patient has physical for 6/8 and needing labs done

## 2020-01-19 NOTE — Telephone Encounter (Signed)
Patient had complete labs 01/18/20(Lipid,Liver, CBC, Met7 and PSA)- was advised to repeat Met 7 in one month- already ordered in Epic, Will patient need any additional labs before physical

## 2020-01-19 NOTE — Progress Notes (Signed)
Lmtc 3/26

## 2020-01-19 NOTE — Telephone Encounter (Signed)
No addtnl tests

## 2020-02-01 DIAGNOSIS — M47816 Spondylosis without myelopathy or radiculopathy, lumbar region: Secondary | ICD-10-CM | POA: Diagnosis not present

## 2020-04-02 ENCOUNTER — Other Ambulatory Visit: Payer: Self-pay

## 2020-04-02 ENCOUNTER — Encounter: Payer: Self-pay | Admitting: Family Medicine

## 2020-04-02 ENCOUNTER — Ambulatory Visit (INDEPENDENT_AMBULATORY_CARE_PROVIDER_SITE_OTHER): Payer: Medicare Other | Admitting: Family Medicine

## 2020-04-02 VITALS — BP 132/80 | HR 72 | Temp 97.4°F | Ht 71.5 in | Wt 248.0 lb

## 2020-04-02 DIAGNOSIS — E785 Hyperlipidemia, unspecified: Secondary | ICD-10-CM | POA: Diagnosis not present

## 2020-04-02 DIAGNOSIS — E871 Hypo-osmolality and hyponatremia: Secondary | ICD-10-CM | POA: Diagnosis not present

## 2020-04-02 DIAGNOSIS — R7301 Impaired fasting glucose: Secondary | ICD-10-CM

## 2020-04-02 DIAGNOSIS — Z0000 Encounter for general adult medical examination without abnormal findings: Secondary | ICD-10-CM

## 2020-04-02 MED ORDER — AMLODIPINE BESYLATE 5 MG PO TABS: ORAL_TABLET | ORAL | 1 refills | Status: DC

## 2020-04-02 MED ORDER — ATORVASTATIN CALCIUM 80 MG PO TABS: ORAL_TABLET | ORAL | 1 refills | Status: DC

## 2020-04-02 MED ORDER — METOPROLOL SUCCINATE ER 50 MG PO TB24: ORAL_TABLET | ORAL | 1 refills | Status: DC

## 2020-04-02 MED ORDER — CLOPIDOGREL BISULFATE 75 MG PO TABS: 75.0000 mg | ORAL_TABLET | Freq: Every day | ORAL | 1 refills | Status: DC

## 2020-04-02 MED ORDER — BENAZEPRIL HCL 20 MG PO TABS: 20.0000 mg | ORAL_TABLET | Freq: Every day | ORAL | 1 refills | Status: DC

## 2020-04-02 MED ORDER — POTASSIUM CHLORIDE CRYS ER 20 MEQ PO TBCR: 20.0000 meq | EXTENDED_RELEASE_TABLET | Freq: Every day | ORAL | 1 refills | Status: DC

## 2020-04-02 MED ORDER — PANTOPRAZOLE SODIUM 40 MG PO TBEC: DELAYED_RELEASE_TABLET | ORAL | 1 refills | Status: DC

## 2020-04-02 NOTE — Progress Notes (Signed)
Patient ID: Mitchell Oliver, male    DOB: [birth year], 74 y.o.   MRN: [id number]   Chief Complaint  Patient presents with  . Annual Exam   Subjective:    HPI AWV- Annual Wellness Visit  The patient was seen for their annual wellness visit. The patient's past medical history, surgical history, and family history were reviewed. Pertinent vaccines were reviewed ( tetanus, pneumonia, shingles, flu) The patient's medication list was reviewed and updated.  The height and weight were entered.  BMI recorded in electronic record elsewhere  Cognitive screening was completed. Outcome of Mini - Cog: pass   Falls /depression screening electronically recorded within record elsewhere  Current tobacco usage: none (All patients who use tobacco were given written and verbal information on quitting)  Recent listing of emergency department/hospitalizations over the past year were reviewed.  current specialist the patient sees on a regular basis: Dr. Daneen Schick - cardiology, [place] - dermatology   Medicare annual wellness visit patient questionnaire was reviewed.  A written screening schedule for the patient for the next 5-10 years was given. Appropriate discussion of followup regarding next visit was discussed.  Saw Dr. Nicki Reaper recently for hay fever and epistaxis, which resolved.   Did blood testing recently and Na was low.  Upon review of chart noticing this since 2014- had 1-2 points under normal for sodium.  Pt stating is sweating a lot and working out sweats a lot.  Eating a lot of salt usually.  Drinking a lot of water, 32 oz per day.  Then was cutting back since 3/21. Pt is on hctz 20m daily for years.  H/o nstemi and cad with stents and on plavix. No bleeding issues.   Medical History Mitchell Oliver has a past medical history of Allergy, COPD (chronic obstructive pulmonary disease) ([facility], CV[place] (cere[place]) ([facility] (2012), ED (erectile dysfunction), Family history  of adverse reaction to anesthesia, GERD (gastroesophageal reflux disease), Hypercholesterolemia, Hypertension, IFG (impaired fasting glucose), NSTEMI (non-ST elevated myocardial infarction) ([facility], and Pneumonia (~ 2005).   Outpatient Encounter Medications as of 04/02/2020  Medication Sig  . amLODipine (NORVASC) 5 MG tablet TAKE 1 TABLET(5 MG) BY MOUTH DAILY  . Ascorbic Acid (VITAMIN C PO) Take 4 tablets by mouth daily. Takes 4000 mg daily  . aspirin EC 81 MG tablet Take 81 mg by mouth daily.  .Marland Kitchenatorvastatin (LIPITOR) 80 MG tablet TAKE 1 TABLET BY MOUTH DAILY  . benazepril (LOTENSIN) 20 MG tablet Take 1 tablet (20 mg total) by mouth daily.  .Marland KitchenCIALIS 20 MG tablet TAKE 1 TABLET BY MOUTH DAILY IF NEEDED FOR ERECTILE DYSFUNCTION, TAKE 2 HOURS TO INTERCOURSE (Patient taking differently: as needed. )  . clopidogrel (PLAVIX) 75 MG tablet Take 1 tablet (75 mg total) by mouth daily.  . Cyanocobalamin (VITAMIN B-12 CR) 1000 MCG TBCR Take 1 tablet by mouth daily.  . fish oil-omega-3 fatty acids 1000 MG capsule Take 2 g by mouth daily.  . fluticasone (FLONASE) 50 MCG/ACT nasal spray Place 1 spray into both nostrils daily as needed for allergies or rhinitis.   . hydrochlorothiazide (HYDRODIURIL) 25 MG tablet TAKE 1 TABLET(25 MG) BY MOUTH EVERY MORNING  . latanoprost (XALATAN) 0.005 % ophthalmic solution Place 1 drop into both eyes at bedtime.   . metoprolol succinate (TOPROL-XL) 50 MG 24 hr tablet TAKE 1 TABLET BY MOUTH DAILY WITH OR IMMEDIATELY FOLLOWING A MEAL  . nitroGLYCERIN (NITROSTAT) 0.4 MG SL tablet Place 1 tablet (0.4 mg total) under the  tongue every 5 (five) minutes as needed for chest pain (maxium 3 tablets daily). Do not take within 36 hrs of Cialis use  . pantoprazole (PROTONIX) 40 MG tablet TAKE 1 TABLET(40 MG) BY MOUTH EVERY MORNING  . potassium chloride SA (KLOR-CON) 20 MEQ tablet Take 1 tablet (20 mEq total) by mouth daily.  . Zinc Acetate 50 MG CAPS Take 50 mg by mouth daily.   .  [DISCONTINUED] amLODipine (NORVASC) 5 MG tablet TAKE 1 TABLET(5 MG) BY MOUTH DAILY  . [DISCONTINUED] atorvastatin (LIPITOR) 80 MG tablet TAKE 1 TABLET BY MOUTH DAILY  . [DISCONTINUED] benazepril (LOTENSIN) 20 MG tablet TAKE 1 TABLET BY MOUTH EVERY DAY  . [DISCONTINUED] clopidogrel (PLAVIX) 75 MG tablet Take 1 tablet (75 mg total) by mouth daily.  . [DISCONTINUED] metoprolol succinate (TOPROL-XL) 50 MG 24 hr tablet TAKE 1 TABLET BY MOUTH DAILY WITH OR IMMEDIATELY FOLLOWING A MEAL  . [DISCONTINUED] pantoprazole (PROTONIX) 40 MG tablet TAKE 1 TABLET(40 MG) BY MOUTH EVERY MORNING  . [DISCONTINUED] potassium chloride SA (KLOR-CON) 20 MEQ tablet TAKE 1 TABLET BY MOUTH DAILY   No facility-administered encounter medications on file as of 04/02/2020.     Review of Systems  Constitutional: Negative for chills and fever.  HENT: Negative for congestion, rhinorrhea and sore throat.   Respiratory: Negative for cough, shortness of breath and wheezing.   Cardiovascular: Negative for chest pain and leg swelling.  Gastrointestinal: Negative for abdominal pain, diarrhea, nausea and vomiting.  Genitourinary: Negative for dysuria and frequency.  Skin: Negative for rash.  Neurological: Negative for dizziness, weakness and headaches.     Vitals BP 132/80   Pulse 72   Temp (!) 97.4 F (36.3 C)   Ht 5' 11.5" (1.816 m)   Wt 248 lb (112.5 kg)   SpO2 97%   BMI 34.11 kg/m   Objective:   Physical Exam Vitals reviewed.  Constitutional:      General: He is not in acute distress.    Appearance: Normal appearance. He is obese. He is not ill-appearing.  HENT:     Head: Normocephalic.     Right Ear: Tympanic membrane, ear canal and external ear normal.     Left Ear: Tympanic membrane, ear canal and external ear normal.     Nose: Nose normal. No congestion.     Mouth/Throat:     Mouth: Mucous membranes are moist.     Pharynx: No oropharyngeal exudate.  Eyes:     Extraocular Movements: Extraocular  movements intact.     Conjunctiva/sclera: Conjunctivae normal.     Pupils: Pupils are equal, round, and reactive to light.  Cardiovascular:     Rate and Rhythm: Normal rate and regular rhythm.     Pulses: Normal pulses.     Heart sounds: Normal heart sounds. No murmur.  Pulmonary:     Effort: Pulmonary effort is normal.     Breath sounds: Normal breath sounds. No wheezing, rhonchi or rales.  Genitourinary:    Comments: Deferred  Musculoskeletal:        General: No swelling, tenderness, deformity or signs of injury. Normal range of motion.     Right lower leg: Edema (1 pitting) present.     Left lower leg: Edema (+1 pitting) present.  Skin:    General: Skin is warm and dry.     Findings: No rash.  Neurological:     General: No focal deficit present.     Mental Status: He is alert and oriented to person, place,  and time.     Cranial Nerves: No cranial nerve deficit.     Sensory: No sensory deficit.     Motor: No weakness.     Gait: Gait normal.  Psychiatric:        Mood and Affect: Mood normal.        Behavior: Behavior normal.      Assessment and Plan   1. Routine general medical examination at a health care facility  2. Hyponatremia - Basic metabolic panel  3. Impaired fasting blood sugar - Hemoglobin A1c  4. Dyslipidemia - CBC; Future - CMP14+EGFR; Future - Lipid panel; Future    With fasting labs 1 wk prior, next appt. Pt to get recheck on bmp and glucose today.  Addendum- 04/05/20 Labs reviewed -  Elevated a1c at 6.8 on labs. Sent metformin 545m xr.  Diabetes dx added to prob list. F/u 332monthfor recheck.

## 2020-04-03 LAB — BASIC METABOLIC PANEL
BUN/Creatinine Ratio: 8 — ABNORMAL LOW (ref 10–24)
BUN: 10 mg/dL (ref 8–27)
CO2: 23 mmol/L (ref 20–29)
Calcium: 10.3 mg/dL — ABNORMAL HIGH (ref 8.6–10.2)
Chloride: 95 mmol/L — ABNORMAL LOW (ref 96–106)
Creatinine, Ser: 1.31 mg/dL — ABNORMAL HIGH (ref 0.76–1.27)
GFR calc Af Amer: 61 mL/min/{1.73_m2} (ref >59)
GFR calc non Af Amer: 53 mL/min/{1.73_m2} — ABNORMAL LOW (ref >59)
Glucose: 115 mg/dL — ABNORMAL HIGH (ref 65–99)
Potassium: 5.2 mmol/L (ref 3.5–5.2)
Sodium: 133 mmol/L — ABNORMAL LOW (ref 134–144)

## 2020-04-03 LAB — HEMOGLOBIN A1C
Est. average glucose Bld gHb Est-mCnc: 148 mg/dL
Hgb A1c MFr Bld: 6.8 % — ABNORMAL HIGH (ref 4.8–5.6)

## 2020-04-05 ENCOUNTER — Telehealth: Payer: Self-pay | Admitting: Family Medicine

## 2020-04-05 ENCOUNTER — Telehealth: Payer: Self-pay | Admitting: *Deleted

## 2020-04-05 MED ORDER — METFORMIN HCL ER 500 MG PO TB24: 500.0000 mg | ORAL_TABLET | Freq: Every day | ORAL | 0 refills | Status: DC

## 2020-04-05 NOTE — Telephone Encounter (Signed)
Patient notified med sent to pharmacy -- he would like to go ahead and schedule 3 month appointment.

## 2020-04-05 NOTE — Telephone Encounter (Signed)
Patient called back and apologized he did not realize the medication called in today was metformin but he has been told by Drs in the past not to take this and he has heard bad reviews and would prefer not to take this med. Please advise if anything else should be sent.

## 2020-04-05 NOTE — Telephone Encounter (Signed)
Schedule appointment for 9/10

## 2020-04-19 ENCOUNTER — Ambulatory Visit: Payer: Self-pay | Admitting: Surgery

## 2020-04-19 ENCOUNTER — Telehealth: Payer: Self-pay

## 2020-04-19 NOTE — Telephone Encounter (Signed)
   Primary Cardiologist:Mitchell Nicholes Stairs III, MD  Chart reviewed as part of pre-operative protocol coverage. Because of [name]'s past medical history and time since last visit, they will require a follow-up visit in order to better assess preoperative cardiovascular risk.  Pre-op covering staff: - Please schedule appointment and call patient to inform them. If patient already had an upcoming appointment within acceptable timeframe, please add "pre-op clearance" to the appointment notes so provider is aware. - Please contact requesting surgeon's office via preferred method (i.e, phone, fax) to inform them of need for appointment prior to surgery.  If applicable, this message will also be routed to pharmacy pool and/or primary cardiologist for input on holding anticoagulant/antiplatelet agent as requested below so that this information is available to the clearing provider at time of patient's appointment.   Please move Dr. Thompson Caul appt to an earlier date. Very low risk excision surgery, however general anesthesia increases its overall risk. Not entirely sure why this type of wide excision surgery would require general anesthesia. Since the patient has not been seen in close to a year, will need another visit. Will forward to Dr. Tamala Julian to make sure he is agreeable.    [place], [place]  04/19/2020, 6:10 PM

## 2020-04-19 NOTE — H&P (Signed)
History of Present Illness Mitchell Oliver. Mitchell Covin MD; 04/19/2020 10:23 AM) The patient is a 74 year old male who presents for a melanoma biopsy follow-up. PCP - Nicki Reaper Luking Referred by Dr. Allyn Kenner for melanoma in situ right upper arm  This is a 74 year old male with a long history of sun exposure who has had multiple previous skin biopsies. Recently he had a biopsy of a suspicious lesion on his right upper arm just above the elbow. 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 One of the peripheral margins was positive. He is referred to [place] to discuss wide excision. He is also scheduled to have a full body skin survey by dermatology. I encouraged him to go ahead and get this scheduled and completed prior to surgery in case we need to excise any other lesions.  The patient has a history of coronary stents and is followed by Dr. Daneen Schick. He is scheduled to see him soon. He is anticoagulated on Plavix and aspirin.   Past Surgical History Emeline Gins, [place]; 04/19/2020 9:29 AM) Appendectomy Oral Surgery  Allergies Emeline Gins, [place]; 04/19/2020 9:30 AM) No Known Drug Allergies [04/19/2020]: Allergies Reconciled  Medication History Emeline Gins, CMA; 04/19/2020 9:31 AM) amLODIPine Besylate (5MG  Tablet, Oral) Active. Atorvastatin Calcium (80MG  Tablet, Oral) Active. Benazepril HCl (20MG  Tablet, Oral) Active. Clopidogrel Bisulfate (75MG  Tablet, Oral) Active. Metoprolol Succinate ER (50MG  Tablet ER 24HR, Oral) Active. Pantoprazole Sodium (40MG  Tablet DR, Oral) Active. Potassium Chloride Crys ER (20MEQ Tablet ER, Oral) Active. hydroCHLOROthiazide (25MG  Tablet, Oral) Active. Aspirin (81MG  Tablet, Oral) Active. Fish Oil (500MG  Capsule, Oral) Active. Medications Reconciled  Social History Emeline Gins, [place]; 04/19/2020 9:29 AM) Alcohol use Occasional alcohol use. Caffeine use Coffee. No drug use Tobacco use Former smoker.  Family History Emeline Gins,  [place]; 04/19/2020 9:29 AM) Arthritis Father. Cerebrovascular Accident Father. Diabetes Mellitus Mother. Heart Disease Father. Hypertension Father. Migraine Headache Father, Sister.  Other Problems Emeline Gins, CMA; 04/19/2020 9:29 AM) Asthma Back Pain Cerebrovascular Accident Gastroesophageal Reflux Disease High blood pressure Hypercholesterolemia Melanoma Myocardial infarction     Review of Systems Emeline Gins CMA; 04/19/2020 9:29 AM) General Not Present- Appetite Loss, Chills, Fatigue, Fever, Night Sweats, Weight Gain and Weight Loss. Skin Present- Change in Wart/Mole. Not Present- Dryness, Hives, Jaundice, New Lesions, Non-Healing Wounds, Rash and Ulcer. HEENT Present- Hearing Loss, Seasonal Allergies and Wears glasses/contact lenses. Not Present- Earache, Hoarseness, Nose Bleed, Oral Ulcers, Ringing in the Ears, Sinus Pain, Sore Throat, Visual Disturbances and Yellow Eyes. Breast Not Present- Breast Mass, Breast Pain, Nipple Discharge and Skin Changes. Cardiovascular Not Present- Chest Pain, Difficulty Breathing Lying Down, Leg Cramps, Palpitations, Rapid Heart Rate, Shortness of Breath and Swelling of Extremities. Gastrointestinal Not Present- Abdominal Pain, Bloating, Bloody Stool, Change in Bowel Habits, Chronic diarrhea, Constipation, Difficulty Swallowing, Excessive gas, Gets full quickly at meals, Hemorrhoids, Indigestion, Nausea, Rectal Pain and Vomiting. Male Genitourinary Not Present- Blood in Urine, Change in Urinary Stream, Frequency, Impotence, Nocturia, Painful Urination, Urgency and Urine Leakage. Musculoskeletal Present- Back Pain, Joint Pain and Joint Stiffness. Not Present- Muscle Pain, Muscle Weakness and Swelling of Extremities. Neurological Not Present- Decreased Memory, Fainting, Headaches, Numbness, Seizures, Tingling, Tremor, Trouble walking and Weakness. Psychiatric Not Present- Anxiety, Bipolar, Change in Sleep Pattern, Depression,  Fearful and Frequent crying. Endocrine Not Present- Cold Intolerance, Excessive Hunger, Hair Changes, Heat Intolerance, Hot flashes and New Diabetes. Hematology Present- Blood Thinners. Not Present- Easy Bruising, Excessive bleeding, Gland problems, HIV and Persistent Infections.  Vitals Emeline Gins CMA; 04/19/2020 9:30  AM) 04/19/2020 9:30 AM Weight: 249.4 lb Height: 72in Body Surface Area: 2.34 m Body Mass Index: 33.82 kg/m  Temp.: 9F  Pulse: 72 (Regular)  BP: 150/88(Sitting, Left Arm, Standard)        Physical Exam Rodman Key K. Syreeta Figler MD; 04/19/2020 10:24 AM)  The physical exam findings are as follows: Note:Constitutional: WDWN in NAD, conversant, no obvious deformities; resting comfortably Eyes: Pupils equal, round; sclera anicteric; moist conjunctiva; no lid lag HENT: Oral mucosa moist; good dentition Neck: No masses palpated, trachea midline; no thyromegaly Lungs: CTA bilaterally; normal respiratory effort CV: Regular rate and rhythm; no murmurs; extremities well-perfused with no edema Abd: +bowel sounds, soft, non-tender, no palpable organomegaly; no palpable hernias Musc: Normal gait; no apparent clubbing or cyanosis in extremities Lymphatic: No palpable cervical or axillary lymphadenopathy Skin: multiple scattered skin lesions; right upper lateral arm above elbow - 1 cm healing biopsy site - no sign of infection Psychiatric - alert and oriented x 4; calm mood and affect    Assessment & Plan Rodman Key K. 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 The surgical procedure has been discussed with the patient. Potential risks, benefits, alternative treatments, and expected outcomes have been explained. All of the patient's questions at this time have been answered. The likelihood of reaching the patient's treatment goal is good. The  patient understand the proposed surgical procedure and wishes to proceed.   Cardiac clearance from Dr. Tamala Julian first. Will need to hold Plavix and ASA  Note:We will plan to excise with 1 cm margins after cardiac clearance.  Mitchell Oliver. Georgette Dover, MD, [facility]  General/ Trauma Surgery   04/19/2020 10:24 AM

## 2020-04-19 NOTE — Telephone Encounter (Signed)
   [facility] Pre-operative Risk Assessment    HEARTCARE STAFF: - Please ensure there is not already an duplicate clearance open for this procedure. - Under Visit Info/Reason for Call, type in Other and utilize the format Clearance MM/DD/YY or Clearance TBD. Do not use dashes or single digits. - If request is for dental extraction, please clarify the # of teeth to be extracted.  Request for surgical clearance:  1. What type of surgery is being performed? Wide excision og melanoma in SITU of right upper arm    2. When is this surgery scheduled? TBD   3. What type of clearance is required (medical clearance vs. Pharmacy clearance to hold med vs. Both)? Both   4. Are there any medications that need to be held prior to surgery and how long? Plavix    5. Practice name and name of physician performing surgery? [facility], Donnie Mesa, MD   6. What is the office phone number? [phone]   7.   What is the office fax number? [phone]  8.   Anesthesia type (None, local, MAC, general) ? General    Mendel Ryder 04/19/2020, 1:08 PM  _________________________________________________________________   (provider comments below)

## 2020-04-21 NOTE — Telephone Encounter (Signed)
He is 5 years post stent implant with inability to completely expand stent. Okay to hold Plavix (on monotherapy without aspirin). For 5-7 days then resume asap.

## 2020-04-22 NOTE — Telephone Encounter (Signed)
I am covering pre-op box today. For clarification, I reached out to Dr. Thompson Caul nurse to discuss with him if patient needed appt prior to clearance since it has been over a year since seeing in clinic. He said he does need to be seen. Callback, see Hao's request below for appt. Thanks. Will remove from pre-op APP box. Kashawn Dirr PA-C

## 2020-04-22 NOTE — Telephone Encounter (Signed)
You should cancel the August appointment

## 2020-04-22 NOTE — Telephone Encounter (Signed)
I s/w pt and scheduled a sooner appt for pre op clearance with Dr. Tamala Julian 04/24/20 @ 4:20. I did not cancel the 05/2020 appt with Dr. Tamala Julian and assured the pt if Dr. Tamala Julian feels ok to cancel the 05/2020 his nurse can cancel the appt when he comes in 04/24/20. I will forward clearance notes to MD for upcoming appt. I will remove from the pre op call back pool.

## 2020-04-22 NOTE — Telephone Encounter (Signed)
Per Dr. Tamala Julian I have cancelled the 05/2020 appt as pt will be seeing Dr. Tamala Julian 04/24/20 for pre op clearance.

## 2020-04-24 ENCOUNTER — Encounter: Payer: Self-pay | Admitting: Interventional Cardiology

## 2020-04-24 ENCOUNTER — Other Ambulatory Visit: Payer: Self-pay

## 2020-04-24 ENCOUNTER — Ambulatory Visit: Payer: Medicare Other | Admitting: Interventional Cardiology

## 2020-04-24 VITALS — BP 116/76 | HR 62 | Ht 71.5 in | Wt 250.0 lb

## 2020-04-24 DIAGNOSIS — I2510 Atherosclerotic heart disease of native coronary artery without angina pectoris: Secondary | ICD-10-CM | POA: Diagnosis not present

## 2020-04-24 DIAGNOSIS — I10 Essential (primary) hypertension: Secondary | ICD-10-CM

## 2020-04-24 DIAGNOSIS — Z7189 Other specified counseling: Secondary | ICD-10-CM

## 2020-04-24 DIAGNOSIS — E785 Hyperlipidemia, unspecified: Secondary | ICD-10-CM

## 2020-04-24 DIAGNOSIS — Z9861 Coronary angioplasty status: Secondary | ICD-10-CM | POA: Diagnosis not present

## 2020-04-24 DIAGNOSIS — C4361 Malignant melanoma of right upper limb, including shoulder: Secondary | ICD-10-CM

## 2020-04-24 DIAGNOSIS — Z01810 Encounter for preprocedural cardiovascular examination: Secondary | ICD-10-CM | POA: Diagnosis not present

## 2020-04-24 NOTE — Progress Notes (Signed)
Cardiology Office Note:    Date:  04/24/2020   ID:  Demonte, Dobratz Sep 07, 1946, MRN [id number]  PCP:  Erven Colla, DO  Cardiologist:  Sinclair Grooms, MD   Referring MD: Erven Colla, DO   Chief Complaint  Patient presents with  . Coronary Artery Disease  . Advice Only    Preop clearance    History of Present Illness:    Mitchell Oliver is a 74 y.o. male with a hx of CAD with drug-eluting stent implantation in setting of non-ST elevation MI in PDA and diagonal back in 2016.Diagonal stenting was associated with inability to fully expand the drug-eluting stent.Also history of emphysema, hypertension and hyperlipidemia. Has upcoming surgery with Dr. Georgette Dover for excision of melanoma and needs surgical clearance.  He has not had angina since the non-ST elevation myocardial infarction during which he received a PDA and diagonal stent.  He walks between 2 and 4 miles per day.  He denies fluid retention, leg edema, orthopnea, and PND.  He has had no significant arrhythmias and denies episodes of syncope.  Past Medical History:  Diagnosis Date  . Allergy   . COPD (chronic obstructive pulmonary disease) ([place])   . CVA (cerebral vascular accident) ([place]) 2012   denies residual on 07/22/2015  . ED (erectile dysfunction)   . Family history of adverse reaction to anesthesia    "think my mother had real bad headaches after anesthesia"  . GERD (gastroesophageal reflux disease)   . Hypercholesterolemia   . Hypertension   . IFG (impaired fasting glucose)   . NSTEMI (non-ST elevated myocardial infarction) ([place])   . Pneumonia ~ 2005    Past Surgical History:  Procedure Laterality Date  . APPENDECTOMY  2009 [facility]  . BACK SURGERY    . BIOPSY  05/04/2014   Procedure: BIOPSY;  Surgeon: Danie Binder, MD;  [facility];  Service: Endoscopy;;  . CARDIAC CATHETERIZATION N/A 07/18/2015   Procedure: Left Heart Cath and Coronary Angiography;  Surgeon: Belva Crome, MD;   Location: [place] [facility];  Service: Cardiovascular;  Laterality: N/A;  . CARDIAC CATHETERIZATION N/A 07/18/2015   Procedure: Coronary Stent Intervention;  Surgeon: Belva Crome, MD;  Location: [place] [facility];  Service: Cardiovascular;  Laterality: N/A;  . CARDIAC CATHETERIZATION N/A 07/22/2015   Procedure: Coronary Stent Intervention;  Surgeon: Belva Crome, MD;  Location: [place] [facility];  Service: Cardiovascular;  Laterality: N/A;  . COLONOSCOPY  2009  . COLONOSCOPY N/A 08/04/2019   Procedure: COLONOSCOPY;  Surgeon: Danie Binder, MD;  [facility];  Service: Endoscopy;  Laterality: N/A;  12:30  . ESOPHAGOGASTRODUODENOSCOPY N/A 05/04/2014   Procedure: ESOPHAGOGASTRODUODENOSCOPY (EGD);  Surgeon: Danie Binder, MD;  [facility];  Service: Endoscopy;  Laterality: N/A;  9:15  . POLYPECTOMY  08/04/2019   Procedure: POLYPECTOMY;  Surgeon: Danie Binder, MD;  [facility];  Service: Endoscopy;;  hepatic flexure, ascending colon, transverse colon, splenic flexure, descending colon   . POSTERIOR LAMINECTOMY / DECOMPRESSION LUMBAR SPINE  06/03/2015  . SAVORY DILATION N/A 05/04/2014   Procedure: SAVORY DILATION;  Surgeon: Danie Binder, MD;  [facility];  Service: Endoscopy;  Laterality: N/A;    Current Medications: Current Meds  Medication Sig  . amLODipine (NORVASC) 5 MG tablet TAKE 1 TABLET(5 MG) BY MOUTH DAILY  . Ascorbic Acid (VITAMIN C PO) Take 4 tablets by mouth daily. Takes 4000 mg  daily  . atorvastatin (LIPITOR) 80 MG tablet TAKE 1 TABLET BY MOUTH DAILY  . benazepril (LOTENSIN) 20 MG tablet Take 1 tablet (20 mg total) by mouth daily.  Marland Kitchen CIALIS 20 MG tablet TAKE 1 TABLET BY MOUTH DAILY IF NEEDED FOR ERECTILE DYSFUNCTION, TAKE 2 HOURS TO INTERCOURSE  . clopidogrel (PLAVIX) 75 MG tablet Take 1 tablet (75 mg total) by mouth daily.  . Cyanocobalamin (VITAMIN B-12 CR) 1000 MCG TBCR Take 1 tablet by mouth daily.  . fish oil-omega-3 fatty  acids 1000 MG capsule Take 2 g by mouth daily.  . fluticasone (FLONASE) 50 MCG/ACT nasal spray Place 1 spray into both nostrils daily as needed for allergies or rhinitis.   . hydrochlorothiazide (HYDRODIURIL) 25 MG tablet TAKE 1 TABLET(25 MG) BY MOUTH EVERY MORNING  . latanoprost (XALATAN) 0.005 % ophthalmic solution Place 1 drop into both eyes at bedtime.   . metFORMIN (GLUCOPHAGE XR) 500 MG 24 hr tablet Take 1 tablet (500 mg total) by mouth daily with breakfast.  . metoprolol succinate (TOPROL-XL) 50 MG 24 hr tablet TAKE 1 TABLET BY MOUTH DAILY WITH OR IMMEDIATELY FOLLOWING A MEAL  . nitroGLYCERIN (NITROSTAT) 0.4 MG SL tablet Place 1 tablet (0.4 mg total) under the tongue every 5 (five) minutes as needed for chest pain (maxium 3 tablets daily). Do not take within 36 hrs of Cialis use  . pantoprazole (PROTONIX) 40 MG tablet TAKE 1 TABLET(40 MG) BY MOUTH EVERY MORNING  . potassium chloride SA (KLOR-CON) 20 MEQ tablet Take 1 tablet (20 mEq total) by mouth daily.  . Zinc Acetate 50 MG CAPS Take 50 mg by mouth daily.   . [DISCONTINUED] aspirin EC 81 MG tablet Take 81 mg by mouth daily.     Allergies:   Patient has no known allergies.   Social History   Socioeconomic History  . Marital status: Divorced    Spouse name: Not on file  . Number of children: Not on file  . Years of education: Not on file  . Highest education level: Not on file  Occupational History  . Not on file  Tobacco Use  . Smoking status: Former Smoker    Packs/day: 2.00    Years: 24.00    Pack years: 48.00    Types: Cigarettes    Start date: 1963    Quit date: 12/24/1985    Years since quitting: 34.3  . Smokeless tobacco: Never Used  Substance and Sexual Activity  . Alcohol use: Yes    Alcohol/week: 6.0 standard drinks    Types: 2 Glasses of wine, 4 Cans of beer per week  . Drug use: No  . Sexual activity: Yes  Other Topics Concern  . Not on file  Social History Narrative  . Not on file   Social Determinants  of Health   Financial Resource Strain:   . Difficulty of Paying Living Expenses:   Food Insecurity:   . Worried About Charity fundraiser in the Last Year:   . Arboriculturist in the Last Year:   Transportation Needs:   . Film/video editor (Medical):   Marland Kitchen Lack of Transportation (Non-Medical):   Physical Activity:   . Days of Exercise per Week:   . Minutes of Exercise per Session:   Stress:   . Feeling of Stress :   Social Connections:   . Frequency of Communication with Friends and Family:   . Frequency of Social Gatherings with Friends and Family:   . Attends  Religious Services:   . Active Member of Clubs or Organizations:   . Attends Archivist Meetings:   Marland Kitchen Marital Status:      Family History: The patient's family history includes Heart attack in his father; Hypertension in his father and mother; Stroke in his father. There is no history of Colon cancer or Colon polyps.  ROS:   Please see the history of present illness.    Chronic back discomfort due to arthritis.  Melanoma right forearm needs resection.  All other systems reviewed and are negative.  EKGs/Labs/Other Studies Reviewed:    The following studies were reviewed today: No new data  EKG:  EKG normal sinus rhythm, normal EKG appearance.  Recent Labs: 01/18/2020: ALT 36; Hemoglobin 15.5; Platelets 278 04/02/2020: BUN 10; Creatinine, Ser 1.31; Potassium 5.2; Sodium 133  Recent Lipid Panel    Component Value Date/Time   CHOL 112 01/18/2020 0902   TRIG 97 01/18/2020 0902   HDL 40 01/18/2020 0902   CHOLHDL 2.8 01/18/2020 0902   CHOLHDL 2.9 07/03/2014 0814   VLDL 21 07/03/2014 0814   LDLCALC 53 01/18/2020 0902    Physical Exam:    VS:  BP 116/76   Pulse 62   Ht 5' 11.5" (1.816 m)   Wt 250 lb (113.4 kg)   SpO2 97%   BMI 34.38 kg/m     Wt Readings from Last 3 Encounters:  04/24/20 250 lb (113.4 kg)  04/02/20 248 lb (112.5 kg)  01/16/20 246 lb (111.6 kg)     GEN: Moderate obesity. No  acute distress HEENT: Normal NECK: No JVD. LYMPHATICS: No lymphadenopathy CARDIAC:  RRR without murmur, gallop, or edema. VASCULAR:  Normal Pulses. No bruits. RESPIRATORY:  Clear to auscultation without rales, wheezing or rhonchi  ABDOMEN: Soft, non-tender, non-distended, No pulsatile mass, MUSCULOSKELETAL: No deformity  SKIN: Melanoma right forearm.  Warm and dry NEUROLOGIC:  Alert and oriented x 3 PSYCHIATRIC:  Normal affect   ASSESSMENT:    1. CAD S/P percutaneous coronary angioplasty   2. Malignant melanoma of right forearm ([place])   3. Encounter for preoperative assessment for noncoronary cardiac surgery   4. Essential hypertension, benign   5. Dyslipidemia   6. Educated about COVID-19 virus infection    PLAN:    In order of problems listed above:  1. He underwent revascularization with stents in 2016 with PDA and diagonal being treated.  We have failed to fully expand the diagonal stent.  He has been on extended dual antiplatelet therapy.  He is now 5 years out from the procedure without any symptoms.  Plan to discontinue aspirin.  Continue Plavix 75 mg/day.  It is okay to Place Plavix for 5 to 7 days prior to the skin resection of melanoma.  1 year follow-up. 2. Right arm malignant melanoma that needs wide resection under general anesthesia.  No date has Been set.  Dr. Georgette Dover will be performing the procedure. 3. He is cleared to proceed with resection of melanoma.  Okay to Place Plavix prior to surgery in the 3 to 7-day timeframe. 4. Blood pressure is adequately controlled.  Continue Toprol-XL, benazepril, amlodipine, and hydrochlorothiazide. 5. Hyperlipidemia should continue to be managed with Lipitor 80 mg/day. 6. Has received COVID-19 vaccination.  Social distancing is being practiced.   Overall, recommendations today are that he can proceed with surgery, it is okay to hold Plavix 5 to 7 days prior to surgery then resume, and daily aspirin has been discontinued.  9 to  74-month  follow-up   Medication Adjustments/Labs and Tests Ordered: Current medicines are reviewed at length with the patient today.  Concerns regarding medicines are outlined above.  Orders Placed This Encounter  Procedures  . EKG 12-Lead   No orders of the defined types were placed in this encounter.   Patient Instructions  Medication Instructions:  1) DISCONTINUE Aspirin  *If you need a refill on your cardiac medications before your next appointment, please call your pharmacy*   Lab Work: None If you have labs (blood work) drawn today and your tests are completely normal, you will receive your results only by: Marland Kitchen MyChart Message (if you have MyChart) OR . A paper copy in the mail If you have any lab test that is abnormal or we need to change your treatment, we will call you to review the results.   Testing/Procedures: None   Follow-Up: At [facility], you and your health needs are our priority.  As part of our continuing mission to provide you with exceptional heart care, we have created designated Provider Care Teams.  These Care Teams include your primary Cardiologist (physician) and Advanced Practice Providers (APPs -  Physician Assistants and Nurse Practitioners) who all work together to provide you with the care you need, when you need it.  We recommend signing up for the patient portal called "MyChart".  Sign up information is provided on this After Visit Summary.  MyChart is used to connect with patients for Virtual Visits (Telemedicine).  Patients are able to view lab/test results, encounter notes, upcoming appointments, etc.  Non-urgent messages can be sent to your provider as well.   To learn more about what you can do with MyChart, go to [email or web address].    Your next appointment:   12 month(s)  The format for your next appointment:   In Person  Provider:   You may see Sinclair Grooms, MD or one of the following Advanced Practice Providers on your  designated Care Team:    Truitt Merle, NP  Cecilie Kicks, NP  Kathyrn Drown, NP    Other Instructions      Signed, Sinclair Grooms, MD  04/24/2020 5:08 PM    [place]

## 2020-04-24 NOTE — Patient Instructions (Signed)
Medication Instructions:  1) DISCONTINUE Aspirin  *If you need a refill on your cardiac medications before your next appointment, please call your pharmacy*   Lab Work: None If you have labs (blood work) drawn today and your tests are completely normal, you will receive your results only by: . MyChart Message (if you have MyChart) OR . A paper copy in the mail If you have any lab test that is abnormal or we need to change your treatment, we will call you to review the results.   Testing/Procedures: None   Follow-Up: At [facility], you and your health needs are our priority.  As part of our continuing mission to provide you with exceptional heart care, we have created designated Provider Care Teams.  These Care Teams include your primary Cardiologist (physician) and Advanced Practice Providers (APPs -  Physician Assistants and Nurse Practitioners) who all work together to provide you with the care you need, when you need it.  We recommend signing up for the patient portal called "MyChart".  Sign up information is provided on this After Visit Summary.  MyChart is used to connect with patients for Virtual Visits (Telemedicine).  Patients are able to view lab/test results, encounter notes, upcoming appointments, etc.  Non-urgent messages can be sent to your provider as well.   To learn more about what you can do with MyChart, go to [email or web address].    Your next appointment:   12 month(s)  The format for your next appointment:   In Person  Provider:   You may see Henry W Smith III, MD or one of the following Advanced Practice Providers on your designated Care Team:    Lori Gerhardt, NP  Laura Ingold, NP  Jill McDaniel, NP    Other Instructions   

## 2020-05-03 LAB — HM DIABETES EYE EXAM

## 2020-05-06 ENCOUNTER — Encounter: Payer: Self-pay | Admitting: Family Medicine

## 2020-05-06 ENCOUNTER — Other Ambulatory Visit: Payer: Self-pay

## 2020-05-06 DIAGNOSIS — D0361 Melanoma in situ of right upper limb, including shoulder: Secondary | ICD-10-CM | POA: Insufficient documentation

## 2020-05-06 MED ORDER — CLOPIDOGREL BISULFATE 75 MG PO TABS: 75.0000 mg | ORAL_TABLET | Freq: Every day | ORAL | 3 refills | Status: DC

## 2020-06-03 ENCOUNTER — Ambulatory Visit: Payer: Medicare Other | Admitting: Interventional Cardiology

## 2020-06-11 ENCOUNTER — Other Ambulatory Visit: Payer: Self-pay | Admitting: Plastic Surgery

## 2020-07-05 ENCOUNTER — Ambulatory Visit: Payer: Medicare Other | Admitting: Family Medicine

## 2020-07-07 ENCOUNTER — Other Ambulatory Visit: Payer: Self-pay | Admitting: Family Medicine

## 2020-07-09 ENCOUNTER — Ambulatory Visit (INDEPENDENT_AMBULATORY_CARE_PROVIDER_SITE_OTHER): Payer: Medicare Other | Admitting: Family Medicine

## 2020-07-09 ENCOUNTER — Encounter: Payer: Self-pay | Admitting: Family Medicine

## 2020-07-09 ENCOUNTER — Other Ambulatory Visit: Payer: Self-pay

## 2020-07-09 VITALS — BP 122/78 | HR 70 | Temp 98.7°F | Ht 71.5 in | Wt 245.2 lb

## 2020-07-09 DIAGNOSIS — E119 Type 2 diabetes mellitus without complications: Secondary | ICD-10-CM

## 2020-07-09 DIAGNOSIS — I10 Essential (primary) hypertension: Secondary | ICD-10-CM

## 2020-07-09 DIAGNOSIS — Z9861 Coronary angioplasty status: Secondary | ICD-10-CM

## 2020-07-09 DIAGNOSIS — H409 Unspecified glaucoma: Secondary | ICD-10-CM

## 2020-07-09 DIAGNOSIS — H3530 Unspecified macular degeneration: Secondary | ICD-10-CM

## 2020-07-09 DIAGNOSIS — I2510 Atherosclerotic heart disease of native coronary artery without angina pectoris: Secondary | ICD-10-CM

## 2020-07-09 DIAGNOSIS — Z23 Encounter for immunization: Secondary | ICD-10-CM | POA: Diagnosis not present

## 2020-07-09 DIAGNOSIS — R7989 Other specified abnormal findings of blood chemistry: Secondary | ICD-10-CM

## 2020-07-09 LAB — POCT GLYCOSYLATED HEMOGLOBIN (HGB A1C): Hemoglobin A1C: 5.8 % — AB (ref 4.0–5.6)

## 2020-07-09 MED ORDER — METFORMIN HCL ER 500 MG PO TB24: ORAL_TABLET | ORAL | 1 refills | Status: DC

## 2020-07-09 NOTE — Progress Notes (Signed)
Patient ID: Mitchell Oliver, male    DOB: Aug 26, 1946, 74 y.o.   MRN: [id number]   Chief Complaint  Patient presents with  . Diabetes    follow up   Subjective:    HPI  DM2- dx with diabetes in 6/21.  Pt stating was told had diabetes in past, but was just managing it by diet. In 621 a1c was 6.8.  Pt stating started 1 tab daily of metformin xr 544m.  a1c 5.8.  Pt is taking the metformin.  Had diarrhea in beginning but has improved.   Not having diarrhea now.  Doesn't like taking the medication but is tolerating it better now. Pt also watching his diet more.  Seeing eye doc ever [address] glaucoma and macular degeneration.  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  Seeing derm q 22mo64mo rechecks.  HLD- doing well no new concerns.  Compliant with meds. No chest pain, palpitations, myalgias or joint pains.  HTN Pt compliant with BP meds.  No SEs Denies chest pain, sob, LE swelling, or blurry vision.   Medical History Mitchell Oliver has a past medical history of Allergy, COPD (chronic obstructive pulmonary disease) (HCC)Mitchell Oliver (cerebral vascular accident) (HCC)[id number]), ED (erectile dysfunction), Family history of adverse reaction to anesthesia, GERD (gastroesophageal reflux disease), Hypercholesterolemia, Hypertension, IFG (impaired fasting glucose), NSTEMI (non-ST elevated myocardial infarction) (HCC)Mitchell Oliver Pneumonia (~ 2005).   Outpatient Encounter Medications as of 07/09/2020  Medication Sig  . amLODipine (NORVASC) 5 MG tablet TAKE 1 TABLET(5 MG) BY MOUTH DAILY  . Ascorbic Acid (VITAMIN C PO) Take 4 tablets by mouth daily. Takes 4000 mg daily  . atorvastatin (LIPITOR) 80 MG tablet TAKE 1 TABLET BY MOUTH DAILY  . benazepril (LOTENSIN) 20 MG tablet Take 1 tablet (20 mg total) by mouth daily.  . CIMarland KitchenLIS 20 MG tablet TAKE 1 TABLET BY MOUTH DAILY IF NEEDED FOR ERECTILE DYSFUNCTION, TAKE 2 HOURS TO INTERCOURSE  . clopidogrel (PLAVIX) 75 MG tablet Take 1 tablet (75 mg total) by mouth  daily.  . Cyanocobalamin (VITAMIN B-12 CR) 1000 MCG TBCR Take 1 tablet by mouth daily.  . fish oil-omega-3 fatty acids 1000 MG capsule Take 2 g by mouth daily.  . fluticasone (FLONASE) 50 MCG/ACT nasal spray Place 1 spray into both nostrils daily as needed for allergies or rhinitis.   . hydrochlorothiazide (HYDRODIURIL) 25 MG tablet TAKE 1 TABLET(25 MG) BY MOUTH EVERY MORNING  . latanoprost (XALATAN) 0.005 % ophthalmic solution Place 1 drop into both eyes at bedtime.   . metFORMIN (GLUCOPHAGE-XR) 500 MG 24 hr tablet TAKE 1 TABLET(500 MG) BY MOUTH DAILY WITH BREAKFAST  . metoprolol succinate (TOPROL-XL) 50 MG 24 hr tablet TAKE 1 TABLET BY MOUTH DAILY WITH OR IMMEDIATELY FOLLOWING A MEAL  . nitroGLYCERIN (NITROSTAT) 0.4 MG SL tablet Place 1 tablet (0.4 mg total) under the tongue every 5 (five) minutes as needed for chest pain (maxium 3 tablets daily). Do not take within 36 hrs of Cialis use  . pantoprazole (PROTONIX) 40 MG tablet TAKE 1 TABLET(40 MG) BY MOUTH EVERY MORNING  . potassium chloride SA (KLOR-CON) 20 MEQ tablet Take 1 tablet (20 mEq total) by mouth daily.  . Zinc Acetate 50 MG CAPS Take 50 mg by mouth daily.   . [DISCONTINUED] metFORMIN (GLUCOPHAGE-XR) 500 MG 24 hr tablet TAKE 1 TABLET(500 MG) BY MOUTH DAILY WITH BREAKFAST   No facility-administered encounter medications on file as of 07/09/2020.   Results for orders placed or performed in  visit on 07/09/20  POCT glycosylated hemoglobin (Hb A1C)  Result Value Ref Range   Hemoglobin A1C 5.8 (A) 4.0 - 5.6 %   HbA1c POC (<> result, manual entry)     HbA1c, POC (prediabetic range)     HbA1c, POC (controlled diabetic range)       Review of Systems  Constitutional: Negative for chills and fever.  HENT: Negative for congestion, rhinorrhea and sore throat.   Respiratory: Negative for cough, shortness of breath and wheezing.   Cardiovascular: Negative for chest pain and leg swelling.  Gastrointestinal: Negative for abdominal pain,  diarrhea, nausea and vomiting.  Genitourinary: Negative for dysuria and frequency.  Skin: Negative for rash.  Neurological: Negative for dizziness, weakness and headaches.     Vitals BP 122/78   Pulse 70   Temp 98.7 F (37.1 C) (Oral)   Ht 5' 11.5" (1.816 m)   Wt 245 lb 3.2 oz (111.2 kg)   SpO2 98%   BMI 33.72 kg/m   Objective:   Physical Exam Vitals and nursing note reviewed.  Constitutional:      General: He is not in acute distress.    Appearance: Normal appearance. He is not ill-appearing.  HENT:     Head: Normocephalic.     Nose: Nose normal. No congestion.     Mouth/Throat:     Mouth: Mucous membranes are moist.     Pharynx: No oropharyngeal exudate.  Eyes:     Extraocular Movements: Extraocular movements intact.     Conjunctiva/sclera: Conjunctivae normal.     Pupils: Pupils are equal, round, and reactive to light.  Cardiovascular:     Rate and Rhythm: Normal rate and regular rhythm.     Pulses: Normal pulses.     Heart sounds: Normal heart sounds. No murmur heard.   Pulmonary:     Effort: Pulmonary effort is normal.     Breath sounds: Normal breath sounds. No wheezing, rhonchi or rales.  Musculoskeletal:        General: Normal range of motion.     Right lower leg: No edema.     Left lower leg: No edema.     Comments: +healing incision/scar on rt upper arm near elbow. No erythema or warmth.  Skin:    General: Skin is warm and dry.     Findings: No rash.  Neurological:     General: No focal deficit present.     Mental Status: He is alert and oriented to person, place, and time.     Cranial Nerves: No cranial nerve deficit.  Psychiatric:        Mood and Affect: Mood normal.        Behavior: Behavior normal.        Thought Content: Thought content normal.        Judgment: Judgment normal.      Assessment and Plan   1. Diabetes mellitus without complication ([place]) - POCT glycosylated hemoglobin (Hb A1C) - CMP14+EGFR - Lipid panel  2. Glaucoma,  unspecified glaucoma type, unspecified laterality  3. Macular degeneration, unspecified laterality, unspecified type  4. Essential hypertension, benign - CMP14+EGFR  5. CAD S/P percutaneous coronary angioplasty - Lipid panel  6. Elevated serum creatinine - CMP14+EGFR    DM2- improved, stable with metformin 585m daily. Labs ordered today. - pt to cont f/u with eye doctor.  htn- stable, controlled, cont meds.  Has h/o glaucoma and macular deg, q[address] eye exam.  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  Removed it last month by surgery. Going q74month for recheck. Seeing Dr. [place] derm.  hld- stable, cont meds. Labs ordered today  F/u 637mor prn.

## 2020-07-10 LAB — CMP14+EGFR
ALT: 36 IU/L (ref 0–44)
AST: 32 IU/L (ref 0–40)
Albumin/Globulin Ratio: 1.7 (ref 1.2–2.2)
Albumin: 4.4 g/dL (ref 3.7–4.7)
Alkaline Phosphatase: 66 IU/L (ref 44–121)
BUN/Creatinine Ratio: 8 — ABNORMAL LOW (ref 10–24)
BUN: 8 mg/dL (ref 8–27)
Bilirubin Total: 1.3 mg/dL — ABNORMAL HIGH (ref 0.0–1.2)
CO2: 20 mmol/L (ref 20–29)
Calcium: 9.8 mg/dL (ref 8.6–10.2)
Chloride: 99 mmol/L (ref 96–106)
Creatinine, Ser: 1.06 mg/dL (ref 0.76–1.27)
GFR calc Af Amer: 78 mL/min/{1.73_m2} (ref >59)
GFR calc non Af Amer: 68 mL/min/{1.73_m2} (ref >59)
Globulin, Total: 2.6 g/dL (ref 1.5–4.5)
Glucose: 114 mg/dL — ABNORMAL HIGH (ref 65–99)
Potassium: 4.7 mmol/L (ref 3.5–5.2)
Sodium: 133 mmol/L — ABNORMAL LOW (ref 134–144)
Total Protein: 7 g/dL (ref 6.0–8.5)

## 2020-07-10 LAB — LIPID PANEL
Chol/HDL Ratio: 2.6 ratio (ref 0.0–5.0)
Cholesterol, Total: 102 mg/dL (ref 100–199)
HDL: 40 mg/dL (ref >39)
LDL Chol Calc (NIH): 48 mg/dL (ref 0–99)
Triglycerides: 64 mg/dL (ref 0–149)
VLDL Cholesterol Cal: 14 mg/dL (ref 5–40)

## 2020-08-15 ENCOUNTER — Other Ambulatory Visit: Payer: Self-pay | Admitting: Gastroenterology

## 2020-08-28 LAB — HM DIABETES EYE EXAM

## 2020-09-04 ENCOUNTER — Other Ambulatory Visit: Payer: Self-pay | Admitting: *Deleted

## 2020-09-04 MED ORDER — HYDROCHLOROTHIAZIDE 25 MG PO TABS: ORAL_TABLET | ORAL | 0 refills | Status: DC

## 2020-10-24 ENCOUNTER — Other Ambulatory Visit: Payer: Self-pay | Admitting: Family Medicine

## 2020-11-08 ENCOUNTER — Ambulatory Visit (INDEPENDENT_AMBULATORY_CARE_PROVIDER_SITE_OTHER): Payer: Medicare Other | Admitting: Family Medicine

## 2020-11-08 ENCOUNTER — Encounter: Payer: Self-pay | Admitting: Family Medicine

## 2020-11-08 VITALS — HR 76 | Temp 97.8°F | Resp 16

## 2020-11-08 DIAGNOSIS — J40 Bronchitis, not specified as acute or chronic: Secondary | ICD-10-CM | POA: Diagnosis not present

## 2020-11-08 DIAGNOSIS — R0600 Dyspnea, unspecified: Secondary | ICD-10-CM

## 2020-11-08 DIAGNOSIS — J019 Acute sinusitis, unspecified: Secondary | ICD-10-CM | POA: Diagnosis not present

## 2020-11-08 DIAGNOSIS — B9689 Other specified bacterial agents as the cause of diseases classified elsewhere: Secondary | ICD-10-CM

## 2020-11-08 DIAGNOSIS — R0609 Other forms of dyspnea: Secondary | ICD-10-CM | POA: Insufficient documentation

## 2020-11-08 MED ORDER — ALBUTEROL SULFATE HFA 108 (90 BASE) MCG/ACT IN AERS: 2.0000 | INHALATION_SPRAY | Freq: Four times a day (QID) | RESPIRATORY_TRACT | 0 refills | Status: DC | PRN

## 2020-11-08 MED ORDER — AMOXICILLIN-POT CLAVULANATE 875-125 MG PO TABS: 1.0000 | ORAL_TABLET | Freq: Two times a day (BID) | ORAL | 0 refills | Status: DC

## 2020-11-08 MED ORDER — FLUTICASONE PROPIONATE 50 MCG/ACT NA SUSP: 1.0000 | Freq: Every day | NASAL | 0 refills | Status: DC | PRN

## 2020-11-08 NOTE — Progress Notes (Addendum)
Patient ID: Mitchell Oliver, male    DOB: 10/06/1946, 75 y.o.   MRN: [id number]   Chief Complaint  Patient presents with  . Sinusitis   Subjective:  CC: congestion, sinus pressure, and sneezing  This is a new problem.  Presents today with a complaint of congestion, sinus pressure and sneezing.  Symptoms started on Wednesday.  Associated symptoms include fatigue, congestion, cough, some labored breathing with ambulation, and diarrhea.  Reports that he is able to stay hydrated, and he denies fever.  He has tried over-the-counter allergy medications for his symptoms.  Sinusitis This is a new problem. Episode onset: Wednesday  There has been no fever. Associated symptoms include congestion, coughing, shortness of breath, sinus pressure and sneezing. Pertinent negatives include no chills or headaches. Treatments tried: otc allergy.     Medical History Visente has a past medical history of Allergy, COPD (chronic obstructive pulmonary disease) ([place]), CVA (cerebral vascular accident) ([place]) (2012), ED (erectile dysfunction), Family history of adverse reaction to anesthesia, GERD (gastroesophageal reflux disease), Hypercholesterolemia, Hypertension, IFG (impaired fasting glucose), NSTEMI (non-ST elevated myocardial infarction) ([place]), and Pneumonia (~ 2005).   Outpatient Encounter Medications as of 11/08/2020  Medication Sig  . albuterol (VENTOLIN HFA) 108 (90 Base) MCG/ACT inhaler Inhale 2 puffs into the lungs every 6 (six) hours as needed for wheezing or shortness of breath.  Marland Kitchen amoxicillin-clavulanate (AUGMENTIN) 875-125 MG tablet Take 1 tablet by mouth 2 (two) times daily.  . predniSONE (DELTASONE) 20 MG tablet Take three tablets by mouth for three days, then two tablets by mouth for three days, then one tablet by mouth for three days.  Marland Kitchen amLODipine (NORVASC) 5 MG tablet TAKE 1 TABLET(5 MG) BY MOUTH DAILY  . Ascorbic Acid (VITAMIN C PO) Take 4 tablets by mouth daily. Takes 4000 mg daily  .  atorvastatin (LIPITOR) 80 MG tablet TAKE 1 TABLET BY MOUTH DAILY  . benazepril (LOTENSIN) 20 MG tablet Take 1 tablet (20 mg total) by mouth daily.  Marland Kitchen CIALIS 20 MG tablet TAKE 1 TABLET BY MOUTH DAILY IF NEEDED FOR ERECTILE DYSFUNCTION, TAKE 2 HOURS TO INTERCOURSE  . clopidogrel (PLAVIX) 75 MG tablet TAKE 1 TABLET(75 MG) BY MOUTH DAILY  . Cyanocobalamin (VITAMIN B-12 CR) 1000 MCG TBCR Take 1 tablet by mouth daily.  . fish oil-omega-3 fatty acids 1000 MG capsule Take 2 g by mouth daily.  . fluticasone (FLONASE) 50 MCG/ACT nasal spray Place 1 spray into both nostrils daily as needed for allergies or rhinitis.  . hydrochlorothiazide (HYDRODIURIL) 25 MG tablet TAKE 1 TABLET(25 MG) BY MOUTH EVERY MORNING  . latanoprost (XALATAN) 0.005 % ophthalmic solution Place 1 drop into both eyes at bedtime.   . metFORMIN (GLUCOPHAGE-XR) 500 MG 24 hr tablet TAKE 1 TABLET(500 MG) BY MOUTH DAILY WITH BREAKFAST  . metoprolol succinate (TOPROL-XL) 50 MG 24 hr tablet TAKE 1 TABLET BY MOUTH DAILY WITH OR IMMEDIATELY FOLLOWING A MEAL  . nitroGLYCERIN (NITROSTAT) 0.4 MG SL tablet Place 1 tablet (0.4 mg total) under the tongue every 5 (five) minutes as needed for chest pain (maxium 3 tablets daily). Do not take within 36 hrs of Cialis use  . pantoprazole (PROTONIX) 40 MG tablet TAKE 1 TABLET(40 MG) BY MOUTH EVERY MORNING  . potassium chloride SA (KLOR-CON) 20 MEQ tablet Take 1 tablet (20 mEq total) by mouth daily.  . Zinc Acetate 50 MG CAPS Take 50 mg by mouth daily.   . [DISCONTINUED] fluticasone (FLONASE) 50 MCG/ACT nasal spray Place 1 spray into both  nostrils daily as needed for allergies or rhinitis.    No facility-administered encounter medications on file as of 11/08/2020.     Review of Systems  Constitutional: Positive for fatigue. Negative for chills and fever.  HENT: Positive for congestion, sinus pressure, sinus pain and sneezing.   Respiratory: Positive for cough and shortness of breath. Negative for choking.    Cardiovascular: Negative for chest pain.  Gastrointestinal: Positive for diarrhea. Negative for abdominal pain, nausea and vomiting.  Neurological: Negative for headaches.     Vitals Pulse 76   Temp 97.8 F (36.6 C)   Resp 16   SpO2 95%   Objective:   Physical Exam Vitals reviewed.  Constitutional:      General: He is not in acute distress.    Appearance: He is not toxic-appearing.  HENT:     Right Ear: Tympanic membrane normal.     Left Ear: Tympanic membrane normal.     Nose: Congestion present.     Right Sinus: Frontal sinus tenderness present.     Left Sinus: Frontal sinus tenderness present.     Mouth/Throat:     Pharynx: Posterior oropharyngeal erythema present.  Cardiovascular:     Rate and Rhythm: Normal rate and regular rhythm.     Heart sounds: Normal heart sounds.  Pulmonary:     Breath sounds: Normal breath sounds. No wheezing.     Comments: Slight labored breathing from walk in. Skin:    General: Skin is warm and dry.  Neurological:     General: No focal deficit present.     Mental Status: He is alert.  Psychiatric:        Behavior: Behavior normal.      Assessment and Plan   1. Acute bacterial rhinosinusitis - amoxicillin-clavulanate (AUGMENTIN) 875-125 MG tablet; Take 1 tablet by mouth 2 (two) times daily.  Dispense: 20 tablet; Refill: 0 - fluticasone (FLONASE) 50 MCG/ACT nasal spray; Place 1 spray into both nostrils daily as needed for allergies or rhinitis.  Dispense: 11.1 g; Refill: 0  2. Dyspnea on exertion - albuterol (VENTOLIN HFA) 108 (90 Base) MCG/ACT inhaler; Inhale 2 puffs into the lungs every 6 (six) hours as needed for wheezing or shortness of breath.  Dispense: 8 g; Refill: 0 - Novel Coronavirus, NAA ([facility])  3. Bronchitis - predniSONE (DELTASONE) 20 MG tablet; Take three tablets by mouth for three days, then two tablets by mouth for three days, then one tablet by mouth for three days.  Dispense: 18 tablet; Refill: 0     Significant frontal sinus pain and tenderness upon palpation, will treat for acute rhinosinusitis.  Concerned for the labored breathing when walking into the office.  Recovered in exam room, oxygen saturation 95%.  This is a new finding for him.  Will give albuterol inhaler, and warning given as stated below.  He will buy a oxygen saturation meter to monitor this at home.  Recommend supportive therapy.  Agrees with plan of care discussed today. Understands warning signs to seek further care: Chest pain, increasing shortness of breath, any significant change in health.  COVID test pending, respiratory warning as stated below given. Understands to follow-up if symptoms worsen, do not improve.  Covid-19 warning:  Covid-19 is a virus that causes hypoxia (low oxygen level in blood) in some people. If you develop any changes in your usual breathing pattern: difficulty catching your breath, more short winded with activity or with resting, or anything that concerns you about your breathing, do not  hesitate to go to the emergency department immediately for evaluation. Covid infection can also affect the way the brain functions if it lacks oxygen, such as, feeling dizzy, passing out, or feeling confused, if you experience any of these symptoms, please do not delay to seek treatment.  Some people experience gastrointestinal problems with Covid, such as vomiting and diarrhea, dehydration is a serious risk and should be avoided. If you are unable to keep liquids down you may need to go to the emergency department for intravenous fluids to avoid dehydration.   Please alert and involve your family and/or friends to help keep an eye on you while you recover from Covid-19. If you have any questions or concerns about your recovery, please do not hesitate to call the office for guidance.   Recommend supportive therapy while you are recovering:   1) Get lots of rest.  2) Take over the counter pain medication if  needed, such as acetaminophen or ibuprofen. Read and follow instructions on the label and make sure not to combine other medications that may have same ingredients in it. It is important to not take too much of these ingredients.  3) Drink plenty of caffeine-free fluids. (If you have heart or kidney problems, follow the instructions of your specialist regarding amounts).  4) If you are hungry, eat a bland diet, such as the BRAT diet (bananas, rice, applesauce, toast).  5) Let us know if you are not feeling better in a week.  Covid-19 Quarantine Instructions:   You have tested positive for Covid-19 infection. The current CDC guidelines for quarantine regardless of vaccination status are:    Please quarantine and isolate at home for 5 days.  - If you have no symptoms or your symptoms are resolving after 5 days you   can leave the home (resolving means no shortness of breath, no fever, no headache, etc). -Continue to wear a mask around others for an additional 5 days.  -If you have a fever or other symptoms continue to stay at home until   resolved.  Use over-the-counter medications for symptoms.If you develop respiratory issues/distress (see Covid warning), seek medical care in the Emergency Department.  If you must leave home or if you have to be around others please wear a mask. Please limit contact with immediate family members in the home, practice social distancing, frequent handwashing and clean hard surfaces touched frequently with household cleaning products. Members of your household will also need to quarantine for 5 days and test on day five if possible.You may also be contacted by the health department for follow up.   Pecolia Ades, FNP-C

## 2020-11-08 NOTE — Patient Instructions (Signed)
Covid-19 warning:  Covid-19 is a virus that causes hypoxia (low oxygen level in blood) in some people. If you develop any changes in your usual breathing pattern: difficulty catching your breath, more short winded with activity or with resting, or anything that concerns you about your breathing, do not hesitate to go to the emergency department immediately for evaluation. Covid infection can also affect the way the brain functions if it lacks oxygen, such as, feeling dizzy, passing out, or feeling confused, if you experience any of these symptoms, please do not delay to seek treatment.  Some people experience gastrointestinal problems with Covid, such as vomiting and diarrhea, dehydration is a serious risk and should be avoided. If you are unable to keep liquids down you may need to go to the emergency department for intravenous fluids to avoid dehydration.   Please alert and involve your family and/or friends to help keep an eye on you while you recover from Covid-19. If you have any questions or concerns about your recovery, please do not hesitate to call the office for guidance.   Recommend supportive therapy while you are recovering:   1) Get lots of rest.  2) Take over the counter pain medication if needed, such as acetaminophen or ibuprofen. Read and follow instructions on the label and make sure not to combine other medications that may have same ingredients in it. It is important to not take too much of these ingredients.  3) Drink plenty of caffeine-free fluids. (If you have heart or kidney problems, follow the instructions of your specialist regarding amounts).  4) If you are hungry, eat a bland diet, such as the BRAT diet (bananas, rice, applesauce, toast).  5) Let us know if you are not feeling better in a week.   Covid-19 Quarantine Instructions:   You have tested positive for Covid-19 infection. The current CDC guidelines for quarantine regardless of vaccination status are:     Please quarantine and isolate at home for 5 days.  - If you have no symptoms or your symptoms are resolving after 5 days you   can leave the home (resolving means no shortness of breath, no fever, no headache, etc). -Continue to wear a mask around others for an additional 5 days.  -If you have a fever or other symptoms continue to stay at home until   resolved.  Use over-the-counter medications for symptoms.If you develop respiratory issues/distress (see Covid warning), seek medical care in the Emergency Department.  If you must leave home or if you have to be around others please wear a mask. Please limit contact with immediate family members in the home, practice social distancing, frequent handwashing and clean hard surfaces touched frequently with household cleaning products. Members of your household will also need to quarantine for 5 days and test on day five if possible.You may also be contacted by the health department for follow up.       Sinusitis, Adult Sinusitis is soreness and swelling (inflammation) of your sinuses. Sinuses are hollow spaces in the bones around your face. They are located:  Around your eyes.  In the middle of your forehead.  Behind your nose.  In your cheekbones. Your sinuses and nasal passages are lined with a fluid called mucus. Mucus drains out of your sinuses. Swelling can trap mucus in your sinuses. This lets germs (bacteria, virus, or fungus) grow, which leads to infection. Most of the time, this condition is caused by a virus. What are the causes? This condition  is caused by:  Allergies.  Asthma.  Germs.  Things that block your nose or sinuses.  Growths in the nose (nasal polyps).  Chemicals or irritants in the air.  Fungus (rare). What increases the risk? You are more likely to develop this condition if:  You have a weak body defense system (immune system).  You do a lot of swimming or diving.  You use nasal sprays too  much.  You smoke. What are the signs or symptoms? The main symptoms of this condition are pain and a feeling of pressure around the sinuses. Other symptoms include:  Stuffy nose (congestion).  Runny nose (drainage).  Swelling and warmth in the sinuses.  Headache.  Toothache.  A cough that may get worse at night.  Mucus that collects in the throat or the back of the nose (postnasal drip).  Being unable to smell and taste.  Being very tired (fatigue).  A fever.  Sore throat.  Bad breath. How is this diagnosed? This condition is diagnosed based on:  Your symptoms.  Your medical history.  A physical exam.  Tests to find out if your condition is short-term (acute) or long-term (chronic). Your doctor may: ? Check your nose for growths (polyps). ? Check your sinuses using a tool that has a light (endoscope). ? Check for allergies or germs. ? Do imaging tests, such as an MRI or CT scan. How is this treated? Treatment for this condition depends on the cause and whether it is short-term or long-term.  If caused by a virus, your symptoms should go away on their own within 10 days. You may be given medicines to relieve symptoms. They include: ? Medicines that shrink swollen tissue in the nose. ? Medicines that treat allergies (antihistamines). ? A spray that treats swelling of the nostrils. ? Rinses that help get rid of thick mucus in your nose (nasal saline washes).  If caused by bacteria, your doctor may wait to see if you will get better without treatment. You may be given antibiotic medicine if you have: ? A very bad infection. ? A weak body defense system.  If caused by growths in the nose, you may need to have surgery. Follow these instructions at home: Medicines  Take, use, or apply over-the-counter and prescription medicines only as told by your doctor. These may include nasal sprays.  If you were prescribed an antibiotic medicine, take it as told by your  doctor. Do not stop taking the antibiotic even if you start to feel better. Hydrate and humidify  Drink enough water to keep your pee (urine) pale yellow.  Use a cool mist humidifier to keep the humidity level in your home above 50%.  Breathe in steam for 10-15 minutes, 3-4 times a day, or as told by your doctor. You can do this in the bathroom while a hot shower is running.  Try not to spend time in cool or dry air.   Rest  Rest as much as you can.  Sleep with your head raised (elevated).  Make sure you get enough sleep each night. General instructions  Put a warm, moist washcloth on your face 3-4 times a day, or as often as told by your doctor. This will help with discomfort.  Wash your hands often with soap and water. If there is no soap and water, use hand sanitizer.  Do not smoke. Avoid being around people who are smoking (secondhand smoke).  Keep all follow-up visits as told by your doctor. This  is important.   Contact a doctor if:  You have a fever.  Your symptoms get worse.  Your symptoms do not get better within 10 days. Get help right away if:  You have a very bad headache.  You cannot stop throwing up (vomiting).  You have very bad pain or swelling around your face or eyes.  You have trouble seeing.  You feel confused.  Your neck is stiff.  You have trouble breathing. Summary  Sinusitis is swelling of your sinuses. Sinuses are hollow spaces in the bones around your face.  This condition is caused by tissues in your nose that become inflamed or swollen. This traps germs. These can lead to infection.  If you were prescribed an antibiotic medicine, take it as told by your doctor. Do not stop taking it even if you start to feel better.  Keep all follow-up visits as told by your doctor. This is important. This information is not intended to replace advice given to you by your health care provider. Make sure you discuss any questions you have with your  health care provider. Document Revised: 03/14/2018 Document Reviewed: 03/14/2018 Elsevier Patient Education  2021 [employer].

## 2020-11-10 ENCOUNTER — Encounter: Payer: Self-pay | Admitting: Family Medicine

## 2020-11-10 DIAGNOSIS — J40 Bronchitis, not specified as acute or chronic: Secondary | ICD-10-CM | POA: Insufficient documentation

## 2020-11-10 MED ORDER — PREDNISONE 20 MG PO TABS: ORAL_TABLET | ORAL | 0 refills | Status: DC

## 2020-11-10 NOTE — Addendum Note (Signed)
Addended by: Pecolia Ades R on: 11/10/2020 10:10 AM   Modules accepted: Orders

## 2020-11-13 ENCOUNTER — Other Ambulatory Visit: Payer: Self-pay | Admitting: Family Medicine

## 2020-11-13 DIAGNOSIS — U071 COVID-19: Secondary | ICD-10-CM | POA: Insufficient documentation

## 2020-11-13 LAB — NOVEL CORONAVIRUS, NAA: SARS-CoV-2, NAA: DETECTED — AB

## 2020-11-13 LAB — SPECIMEN STATUS REPORT

## 2020-11-14 ENCOUNTER — Telehealth: Payer: Self-pay

## 2020-11-14 NOTE — Telephone Encounter (Signed)
Called to discuss with patient about COVID-19 symptoms and the use of one of the available treatments for those with mild to moderate Covid symptoms and at a high risk of hospitalization.  Pt appears to qualify for outpatient treatment due to co-morbid conditions and/or a member of an at-risk group in accordance with the FDA Emergency Use Authorization.    Symptom onset: 11/06/20 Vaccinated: No Booster? No Immunocompromised? No Qualifiers: COPD per chart  Unable to reach pt - Pt. Is out of 7 day window for treatment.   Mitchell Oliver

## 2020-11-29 ENCOUNTER — Other Ambulatory Visit: Payer: Self-pay | Admitting: Family Medicine

## 2020-11-29 DIAGNOSIS — R0600 Dyspnea, unspecified: Secondary | ICD-10-CM

## 2020-11-29 DIAGNOSIS — R0609 Other forms of dyspnea: Secondary | ICD-10-CM

## 2020-12-01 ENCOUNTER — Other Ambulatory Visit: Payer: Self-pay | Admitting: Family Medicine

## 2020-12-02 ENCOUNTER — Other Ambulatory Visit: Payer: Self-pay | Admitting: Family Medicine

## 2020-12-20 ENCOUNTER — Other Ambulatory Visit: Payer: Self-pay | Admitting: Family Medicine

## 2020-12-25 LAB — HM DIABETES EYE EXAM

## 2020-12-27 ENCOUNTER — Other Ambulatory Visit: Payer: Self-pay | Admitting: Family Medicine

## 2021-01-05 ENCOUNTER — Other Ambulatory Visit: Payer: Self-pay | Admitting: Family Medicine

## 2021-01-05 DIAGNOSIS — J019 Acute sinusitis, unspecified: Secondary | ICD-10-CM

## 2021-01-05 DIAGNOSIS — B9689 Other specified bacterial agents as the cause of diseases classified elsewhere: Secondary | ICD-10-CM

## 2021-01-08 ENCOUNTER — Ambulatory Visit: Payer: Medicare Other | Admitting: Family Medicine

## 2021-01-13 ENCOUNTER — Other Ambulatory Visit: Payer: Self-pay

## 2021-01-13 ENCOUNTER — Ambulatory Visit (INDEPENDENT_AMBULATORY_CARE_PROVIDER_SITE_OTHER): Payer: Medicare Other | Admitting: Family Medicine

## 2021-01-13 ENCOUNTER — Ambulatory Visit (HOSPITAL_COMMUNITY)
Admission: RE | Admit: 2021-01-13 | Discharge: 2021-01-13 | Disposition: A | Discharge status: Home / Self Care | Payer: Medicare Other | Source: Ambulatory Visit | Attending: Family Medicine | Admitting: Family Medicine

## 2021-01-13 ENCOUNTER — Encounter: Payer: Self-pay | Admitting: Family Medicine

## 2021-01-13 VITALS — BP 121/79 | HR 59 | Temp 97.2°F | Wt 235.6 lb

## 2021-01-13 DIAGNOSIS — E119 Type 2 diabetes mellitus without complications: Secondary | ICD-10-CM

## 2021-01-13 DIAGNOSIS — M25522 Pain in left elbow: Secondary | ICD-10-CM | POA: Insufficient documentation

## 2021-01-13 DIAGNOSIS — Z9109 Other allergy status, other than to drugs and biological substances: Secondary | ICD-10-CM

## 2021-01-13 DIAGNOSIS — I10 Essential (primary) hypertension: Secondary | ICD-10-CM

## 2021-01-13 DIAGNOSIS — E785 Hyperlipidemia, unspecified: Secondary | ICD-10-CM | POA: Diagnosis not present

## 2021-01-13 MED ORDER — CLOPIDOGREL BISULFATE 75 MG PO TABS: ORAL_TABLET | ORAL | 1 refills | Status: DC

## 2021-01-13 MED ORDER — ATORVASTATIN CALCIUM 80 MG PO TABS: ORAL_TABLET | ORAL | 1 refills | Status: DC

## 2021-01-13 MED ORDER — METFORMIN HCL ER 500 MG PO TB24: ORAL_TABLET | ORAL | 1 refills | Status: DC

## 2021-01-13 MED ORDER — FLUTICASONE PROPIONATE 50 MCG/ACT NA SUSP: NASAL | 3 refills | Status: DC

## 2021-01-13 NOTE — Progress Notes (Signed)
Patient ID: Mitchell Oliver, male    DOB: Jun 15, 1946, 75 y.o.   MRN: [id number]   Chief Complaint  Patient presents with  . Diabetes  . Hypertension   Subjective:    HPI Pt here for follow up on DM and HTN. Pt not checking sugars. Blood pressure doing good. Pt states he needs something for allergies.  Pt states he did not get a call back for [facility]. Per chart Benedetto Goad tried to get in contact with him on 11/14/20.   Left elbow swelling- Hit elbow on the deck about 3 months ago. Now some swelling that wont resolve.  No pain.  DM2- not check bg at home.  Taking his medications daily. Not having any foot concerns, no burning pain, cuts or sores on feet.   Has not had his labs completed.   Went to eye doctor last week.  Glaucoma improving and no retinopathy, per pt.  HTN Pt compliant with BP meds.  No SEs Denies chest pain, sob, LE swelling, or blurry vision.  Dm2- Compliant with medications. Checking blood glucose.   Not seeing any high or low numbers.  Denies polyuria or polydipsia.  Eye exam: utd, this month. Foot exam: no concerns.   Medical History Pleasant has a past medical history of Allergy, COPD (chronic obstructive pulmonary disease) ([place]), CVA (cerebral vascular accident) ([place]) (2012), ED (erectile dysfunction), Family history of adverse reaction to anesthesia, GERD (gastroesophageal reflux disease), Hypercholesterolemia, Hypertension, IFG (impaired fasting glucose), NSTEMI (non-ST elevated myocardial infarction) ([place]), and Pneumonia (~ 2005).   Outpatient Encounter Medications as of 01/13/2021  Medication Sig  . amLODipine (NORVASC) 5 MG tablet TAKE 1 TABLET(5 MG) BY MOUTH DAILY  . Ascorbic Acid (VITAMIN C PO) Take 4 tablets by mouth daily. Takes 4000 mg daily  . benazepril (LOTENSIN) 20 MG tablet TAKE 1 TABLET(20 MG) BY MOUTH DAILY  . CIALIS 20 MG tablet TAKE 1 TABLET BY MOUTH DAILY IF NEEDED FOR ERECTILE DYSFUNCTION, TAKE 2 HOURS TO  INTERCOURSE  . Cyanocobalamin (VITAMIN B-12 CR) 1000 MCG TBCR Take 1 tablet by mouth daily.  . fish oil-omega-3 fatty acids 1000 MG capsule Take 2 g by mouth daily.  . hydrochlorothiazide (HYDRODIURIL) 25 MG tablet TAKE 1 TABLET(25 MG) BY MOUTH EVERY MORNING  . latanoprost (XALATAN) 0.005 % ophthalmic solution Place 1 drop into both eyes at bedtime.   . metoprolol succinate (TOPROL-XL) 50 MG 24 hr tablet TAKE 1 TABLET BY MOUTH DAILY WITH OR IMMEDIATELY FOLLOWING A MEAL  . nitroGLYCERIN (NITROSTAT) 0.4 MG SL tablet Place 1 tablet (0.4 mg total) under the tongue every 5 (five) minutes as needed for chest pain (maxium 3 tablets daily). Do not take within 36 hrs of Cialis use  . pantoprazole (PROTONIX) 40 MG tablet TAKE 1 TABLET(40 MG) BY MOUTH EVERY MORNING  . potassium chloride SA (KLOR-CON) 20 MEQ tablet TAKE 1 TABLET(20 MEQ) BY MOUTH DAILY  . Zinc Acetate 50 MG CAPS Take 50 mg by mouth daily.   . [DISCONTINUED] albuterol (VENTOLIN HFA) 108 (90 Base) MCG/ACT inhaler INHALE 2 PUFFS INTO THE LUNGS EVERY 6 HOURS AS NEEDED FOR WHEEZING OR SHORTNESS OF BREATH  . [DISCONTINUED] atorvastatin (LIPITOR) 80 MG tablet TAKE 1 TABLET BY MOUTH DAILY  . [DISCONTINUED] clopidogrel (PLAVIX) 75 MG tablet TAKE 1 TABLET(75 MG) BY MOUTH DAILY  . [DISCONTINUED] fluticasone (FLONASE) 50 MCG/ACT nasal spray SHAKE LIQUID AND USE 1 SPRAY IN EACH NOSTRIL DAILY AS NEEDED FOR ALLERGIES OR RHINITIS  . [DISCONTINUED] metFORMIN (  GLUCOPHAGE-XR) 500 MG 24 hr tablet TAKE 1 TABLET(500 MG) BY MOUTH DAILY WITH BREAKFAST  . atorvastatin (LIPITOR) 80 MG tablet TAKE 1 TABLET BY MOUTH DAILY  . clopidogrel (PLAVIX) 75 MG tablet TAKE 1 TABLET(75 MG) BY MOUTH DAILY  . fluticasone (FLONASE) 50 MCG/ACT nasal spray SHAKE LIQUID AND USE 1 SPRAY IN EACH NOSTRIL DAILY AS NEEDED FOR ALLERGIES OR RHINITIS  . metFORMIN (GLUCOPHAGE-XR) 500 MG 24 hr tablet TAKE 1 TABLET(500 MG) BY MOUTH DAILY WITH BREAKFAST  . [DISCONTINUED] amoxicillin-clavulanate  (AUGMENTIN) 875-125 MG tablet Take 1 tablet by mouth 2 (two) times daily.  . [DISCONTINUED] predniSONE (DELTASONE) 20 MG tablet Take three tablets by mouth for three days, then two tablets by mouth for three days, then one tablet by mouth for three days.   No facility-administered encounter medications on file as of 01/13/2021.     Review of Systems  Constitutional: Negative for chills and fever.  HENT: Negative for congestion, rhinorrhea and sore throat.   Respiratory: Negative for cough, shortness of breath and wheezing.   Cardiovascular: Negative for chest pain and leg swelling.  Gastrointestinal: Negative for abdominal pain, diarrhea, nausea and vomiting.  Genitourinary: Negative for dysuria and frequency.  Musculoskeletal:       +left elbow pain/swelling   Skin: Negative for rash.  Neurological: Negative for dizziness, weakness and headaches.     Vitals BP 121/79   Pulse (!) 59   Temp (!) 97.2 F (36.2 C)   Wt 235 lb 9.6 oz (106.9 kg)   SpO2 97%   BMI 32.40 kg/m   Objective:   Physical Exam Vitals and nursing note reviewed.  Constitutional:      General: He is not in acute distress.    Appearance: Normal appearance. He is not ill-appearing.  HENT:     Head: Normocephalic.     Nose: Nose normal. No congestion.     Mouth/Throat:     Mouth: Mucous membranes are moist.     Pharynx: No oropharyngeal exudate.  Eyes:     Extraocular Movements: Extraocular movements intact.     Conjunctiva/sclera: Conjunctivae normal.     Pupils: Pupils are equal, round, and reactive to light.  Cardiovascular:     Rate and Rhythm: Normal rate and regular rhythm.     Pulses: Normal pulses.     Heart sounds: Normal heart sounds. No murmur heard.   Pulmonary:     Effort: Pulmonary effort is normal.     Breath sounds: Normal breath sounds. No wheezing, rhonchi or rales.  Musculoskeletal:        General: Swelling (left elbow and small solid foreign body in the bursa.) and signs of  injury (left elbow) present. No tenderness. Normal range of motion.     Right lower leg: No edema.     Left lower leg: No edema.  Skin:    General: Skin is warm and dry.     Findings: No rash.  Neurological:     General: No focal deficit present.     Mental Status: He is alert and oriented to person, place, and time.     Cranial Nerves: No cranial nerve deficit.  Psychiatric:        Mood and Affect: Mood normal.        Behavior: Behavior normal.        Thought Content: Thought content normal.        Judgment: Judgment normal.      Assessment and Plan   1. Diabetes  mellitus without complication (HCC) - PGF84+KJIZ - Microalbumin, urine - Hemoglobin A1c  2. Elbow pain, left - DG Elbow Complete Left; Future  3. Essential hypertension, benign - CBC - CMP14+EGFR  4. Dyslipidemia - CBC - Lipid panel  5. Environmental allergies - fluticasone (FLONASE) 50 MCG/ACT nasal spray; SHAKE LIQUID AND USE 1 SPRAY IN EACH NOSTRIL DAILY AS NEEDED FOR ALLERGIES OR RHINITIS  Dispense: 16 g; Refill: 3    DM2-  Eye exam last week- no retinopathy per pt. Will order last exam. Has glaucoma and is improving. a1c 6.3. increased from last time at 5.8.  Cont meds and dec carbs in diet. Cont with metformin. No foot concerns.  Elbow swelling-  ordered xray.  Rest, ice, tylenol prn  htn- stable. Cont meds.  hld- stable, cont meds.   Return in about 6 months (around 07/16/2021) for f/u dm2, htn.

## 2021-01-14 LAB — CMP14+EGFR
ALT: 33 IU/L (ref 0–44)
AST: 34 IU/L (ref 0–40)
Albumin/Globulin Ratio: 1.5 (ref 1.2–2.2)
Albumin: 4.1 g/dL (ref 3.7–4.7)
Alkaline Phosphatase: 85 IU/L (ref 44–121)
BUN/Creatinine Ratio: 11 (ref 10–24)
BUN: 11 mg/dL (ref 8–27)
Bilirubin Total: 1 mg/dL (ref 0.0–1.2)
CO2: 17 mmol/L — ABNORMAL LOW (ref 20–29)
Calcium: 9.4 mg/dL (ref 8.6–10.2)
Chloride: 102 mmol/L (ref 96–106)
Creatinine, Ser: 0.98 mg/dL (ref 0.76–1.27)
Globulin, Total: 2.8 g/dL (ref 1.5–4.5)
Glucose: 77 mg/dL (ref 65–99)
Potassium: 4.6 mmol/L (ref 3.5–5.2)
Sodium: 136 mmol/L (ref 134–144)
Total Protein: 6.9 g/dL (ref 6.0–8.5)
eGFR: 80 mL/min/{1.73_m2} (ref >59)

## 2021-01-14 LAB — HEMOGLOBIN A1C
Est. average glucose Bld gHb Est-mCnc: 134 mg/dL
Hgb A1c MFr Bld: 6.3 % — ABNORMAL HIGH (ref 4.8–5.6)

## 2021-01-14 LAB — MICROALBUMIN, URINE: Microalbumin, Urine: 209.8 ug/mL

## 2021-01-14 LAB — LIPID PANEL
Chol/HDL Ratio: 2.9 ratio (ref 0.0–5.0)
Cholesterol, Total: 106 mg/dL (ref 100–199)
HDL: 36 mg/dL — ABNORMAL LOW (ref >39)
LDL Chol Calc (NIH): 52 mg/dL (ref 0–99)
Triglycerides: 95 mg/dL (ref 0–149)
VLDL Cholesterol Cal: 18 mg/dL (ref 5–40)

## 2021-01-14 LAB — CBC
Hematocrit: 42.4 % (ref 37.5–51.0)
Hemoglobin: 14.4 g/dL (ref 13.0–17.7)
MCH: 29.4 pg (ref 26.6–33.0)
MCHC: 34 g/dL (ref 31.5–35.7)
MCV: 87 fL (ref 79–97)
Platelets: 282 10*3/uL (ref 150–450)
RBC: 4.9 x10E6/uL (ref 4.14–5.80)
RDW: 13.4 % (ref 11.6–15.4)
WBC: 11.1 10*3/uL — ABNORMAL HIGH (ref 3.4–10.8)

## 2021-01-17 ENCOUNTER — Other Ambulatory Visit: Payer: Self-pay | Admitting: Family Medicine

## 2021-01-17 DIAGNOSIS — R0609 Other forms of dyspnea: Secondary | ICD-10-CM

## 2021-01-17 DIAGNOSIS — R0600 Dyspnea, unspecified: Secondary | ICD-10-CM

## 2021-01-25 ENCOUNTER — Encounter: Payer: Self-pay | Admitting: Family Medicine

## 2021-02-10 ENCOUNTER — Other Ambulatory Visit: Payer: Self-pay | Admitting: Family Medicine

## 2021-02-10 DIAGNOSIS — R0600 Dyspnea, unspecified: Secondary | ICD-10-CM

## 2021-02-10 DIAGNOSIS — R0609 Other forms of dyspnea: Secondary | ICD-10-CM

## 2021-02-24 ENCOUNTER — Other Ambulatory Visit: Payer: Self-pay

## 2021-02-24 MED ORDER — AMLODIPINE BESYLATE 5 MG PO TABS: ORAL_TABLET | ORAL | 0 refills | Status: DC

## 2021-02-25 ENCOUNTER — Other Ambulatory Visit: Payer: Self-pay | Admitting: Family Medicine

## 2021-03-05 ENCOUNTER — Other Ambulatory Visit: Payer: Self-pay | Admitting: Family Medicine

## 2021-03-05 ENCOUNTER — Other Ambulatory Visit: Payer: Self-pay

## 2021-03-05 MED ORDER — POTASSIUM CHLORIDE CRYS ER 20 MEQ PO TBCR: EXTENDED_RELEASE_TABLET | ORAL | 0 refills | Status: DC

## 2021-03-10 ENCOUNTER — Other Ambulatory Visit: Payer: Self-pay | Admitting: Family Medicine

## 2021-03-10 DIAGNOSIS — Z9109 Other allergy status, other than to drugs and biological substances: Secondary | ICD-10-CM

## 2021-03-23 ENCOUNTER — Other Ambulatory Visit: Payer: Self-pay | Admitting: Family Medicine

## 2021-03-29 ENCOUNTER — Other Ambulatory Visit: Payer: Self-pay | Admitting: Family Medicine

## 2021-04-29 NOTE — Progress Notes (Signed)
Cardiology Office Note:    Date:  04/30/2021   ID:  Mitchell Oliver, Mitchell Oliver 05/09/1946, MRN [id number]  PCP:  Erven Colla, DO  Cardiologist:  Sinclair Grooms, MD   Referring MD: Erven Colla, DO   Chief Complaint  Patient presents with   Coronary Artery Disease    History of Present Illness:    Mitchell Oliver is a 75 y.o. male with a hx of  CAD with drug-eluting stent implantation in setting of non-ST elevation MI in [place] and diagonal back in 2016. Diagonal stenting was associated with inability to fully expand the drug-eluting stent. Also history of emphysema, hypertension and hyperlipidemia.  Has upcoming surgery with Dr. Georgette Dover for excision of melanoma and needs surgical clearance.  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  He now works Patent examiner houses and is very active without any chest discomfort or symptoms of heart disease.  He has not had edema.  He denies orthopnea and PND.  No episodes of syncope.  No medication side effects.  He is compliant with his preventive therapy which includes high intensity Lipitor, blood pressure therapy including Lotensin/Norvasc/HCTZ/Toprol-XL.  No nitroglycerin use has occurred.    Past Medical History:  Diagnosis Date   Allergy    COPD (chronic obstructive pulmonary disease) ([place])    CVA (cerebral vascular accident) ([place]) 2012   denies residual on 07/22/2015   ED (erectile dysfunction)    Family history of adverse reaction to anesthesia    "think my mother had real bad headaches after anesthesia"   GERD (gastroesophageal reflux disease)    Hypercholesterolemia    Hypertension    IFG (impaired fasting glucose)    NSTEMI (non-ST elevated myocardial infarction) ([place])    Pneumonia ~ 2005    Past Surgical History:  Procedure Laterality Date   APPENDECTOMY  2009 [facility]   BACK SURGERY     BIOPSY  05/04/2014   Procedure: BIOPSY;  Surgeon: Danie Binder, MD;  [facility];   Service: Endoscopy;;   CARDIAC CATHETERIZATION N/A 07/18/2015   Procedure: Left Heart Cath and Coronary Angiography;  Surgeon: Belva Crome, MD;  Location: [place] [facility];  Service: Cardiovascular;  Laterality: N/A;   CARDIAC CATHETERIZATION N/A 07/18/2015   Procedure: Coronary Stent Intervention;  Surgeon: Belva Crome, MD;  Location: [place] [facility];  Service: Cardiovascular;  Laterality: N/A;   CARDIAC CATHETERIZATION N/A 07/22/2015   Procedure: Coronary Stent Intervention;  Surgeon: Belva Crome, MD;  Location: [facility] [facility];  Service: Cardiovascular;  Laterality: N/A;   COLONOSCOPY  2009   COLONOSCOPY N/A 08/04/2019   Procedure: COLONOSCOPY;  Surgeon: Danie Binder, MD;  [facility];  Service: Endoscopy;  Laterality: N/A;  12:30   ESOPHAGOGASTRODUODENOSCOPY N/A 05/04/2014   Procedure: ESOPHAGOGASTRODUODENOSCOPY (EGD);  Surgeon: Danie Binder, MD;  [facility];  Service: Endoscopy;  Laterality: N/A;  9:15   POLYPECTOMY  08/04/2019   Procedure: POLYPECTOMY;  Surgeon: Danie Binder, MD;  [facility];  Service: Endoscopy;;  hepatic flexure, ascending colon, transverse colon, splenic flexure, descending colon    POSTERIOR LAMINECTOMY / DECOMPRESSION LUMBAR SPINE  06/03/2015   SAVORY DILATION N/A 05/04/2014   Procedure: SAVORY DILATION;  Surgeon: Danie Binder, MD;  [facility];  Service: Endoscopy;  Laterality: N/A;    Current Medications: Current Meds  Medication Sig   albuterol (VENTOLIN  HFA) 108 (90 Base) MCG/ACT inhaler INHALE 2 PUFFS INTO THE LUNGS EVERY 6 HOURS AS NEEDED FOR WHEEZING OR SHORTNESS OF BREATH   amLODipine (NORVASC) 5 MG tablet TAKE 1 TABLET(5 MG) BY MOUTH DAILY   Ascorbic Acid (VITAMIN C PO) Take 4 tablets by mouth daily. Takes 4000 mg daily   atorvastatin (LIPITOR) 80 MG tablet TAKE 1 TABLET BY MOUTH DAILY   benazepril (LOTENSIN) 20 MG tablet TAKE 1 TABLET(20 MG) BY MOUTH DAILY   clopidogrel (PLAVIX) 75 MG  tablet TAKE 1 TABLET(75 MG) BY MOUTH DAILY   Cyanocobalamin (VITAMIN B-12 CR) 1000 MCG TBCR Take 1 tablet by mouth daily.   fish oil-omega-3 fatty acids 1000 MG capsule Take 2 g by mouth daily.   fluticasone (FLONASE) 50 MCG/ACT nasal spray SHAKE LIQUID AND USE 1 SPRAY IN EACH NOSTRIL DAILY AS NEEDED FOR ALLERGIES OR RHINITIS   hydrochlorothiazide (HYDRODIURIL) 25 MG tablet TAKE 1 TABLET(25 MG) BY MOUTH EVERY MORNING   latanoprost (XALATAN) 0.005 % ophthalmic solution Place 1 drop into both eyes at bedtime.    metFORMIN (GLUCOPHAGE-XR) 500 MG 24 hr tablet TAKE 1 TABLET(500 MG) BY MOUTH DAILY WITH BREAKFAST   metoprolol succinate (TOPROL-XL) 50 MG 24 hr tablet TAKE 1 TABLET BY MOUTH DAILY WITH OR IMMEDIATELY FOLLOWING A MEAL   nitroGLYCERIN (NITROSTAT) 0.4 MG SL tablet Place 1 tablet (0.4 mg total) under the tongue every 5 (five) minutes as needed for chest pain (maxium 3 tablets daily). Do not take within 36 hrs of Cialis use   pantoprazole (PROTONIX) 40 MG tablet TAKE 1 TABLET(40 MG) BY MOUTH EVERY MORNING   potassium chloride SA (KLOR-CON) 20 MEQ tablet TAKE 1 TABLET(20 MEQ) BY MOUTH DAILY   Zinc Acetate 50 MG CAPS Take 50 mg by mouth daily.      Allergies:   Patient has no known allergies.   Social History   Socioeconomic History   Marital status: Divorced    Spouse name: Not on file   Number of children: Not on file   Years of education: Not on file   Highest education level: Not on file  Occupational History   Not on file  Tobacco Use   Smoking status: Former    Packs/day: 2.00    Years: 24.00    Pack years: 48.00    Types: Cigarettes    Start date: 1963    Quit date: 12/24/1985    Years since quitting: 35.3   Smokeless tobacco: Never  Substance and Sexual Activity   Alcohol use: Yes    Alcohol/week: 6.0 standard drinks    Types: 2 Glasses of wine, 4 Cans of beer per week   Drug use: No   Sexual activity: Yes  Other Topics Concern   Not on file  Social History Narrative    Not on file   Social Determinants of Health   Financial Resource Strain: Not on file  Food Insecurity: Not on file  Transportation Needs: Not on file  Physical Activity: Not on file  Stress: Not on file  Social Connections: Not on file     Family History: The patient's family history includes Heart attack in his father; Hypertension in his father and mother; Stroke in his father. There is no history of Colon cancer or Colon polyps.  ROS:   Please see the history of present illness.    He has diabetes.  He is on Glucophage.  He denies claudication.  No neurological symptoms.  All other systems reviewed and are  negative.  EKGs/Labs/Other Studies Reviewed:    The following studies were reviewed today: No cardiac imaging or recent testing.  EKG:  EKG sinus rhythm at 61 bpm with normal appearance.  Recent Labs: 01/13/2021: ALT 33; BUN 11; Creatinine, Ser 0.98; Hemoglobin 14.4; Platelets 282; Potassium 4.6; Sodium 136  Recent Lipid Panel    Component Value Date/Time   CHOL 106 01/13/2021 1528   TRIG 95 01/13/2021 1528   HDL 36 (L) 01/13/2021 1528   CHOLHDL 2.9 01/13/2021 1528   CHOLHDL 2.9 07/03/2014 0814   VLDL 21 07/03/2014 0814   LDLCALC 52 01/13/2021 1528    Physical Exam:    VS:  BP 138/82   Pulse 61   Ht 5\' 11"  (1.803 m)   Wt 240 lb 12.8 oz (109.2 kg)   SpO2 97%   BMI 33.58 kg/m     Wt Readings from Last 3 Encounters:  04/30/21 240 lb 12.8 oz (109.2 kg)  01/13/21 235 lb 9.6 oz (106.9 kg)  07/09/20 245 lb 3.2 oz (111.2 kg)     GEN: Overweight. No acute distress HEENT: Normal NECK: No JVD. LYMPHATICS: No lymphadenopathy CARDIAC: No murmur. RRR S4 gallop, or edema. VASCULAR:  Normal Pulses. No bruits. RESPIRATORY:  Clear to auscultation without rales, wheezing or rhonchi  ABDOMEN: Soft, non-tender, non-distended, No pulsatile mass, MUSCULOSKELETAL: No deformity  SKIN: Warm and dry NEUROLOGIC:  Alert and oriented x 3 PSYCHIATRIC:  Normal affect    ASSESSMENT:    1. CAD S/P percutaneous coronary angioplasty   2. Essential hypertension, benign   3. Dyslipidemia   4. Diabetes mellitus without complication ([place])    PLAN:    In order of problems listed above:  He is not having angina.  Secondary prevention is discussed. Blood pressures well controlled on current therapy at his age.  Low-salt diet and weight loss recommended Continue high intensity Lipitor 80 mg/day.  Last LDL was 52 in March. Most recent hemoglobin A1c 6.3 in March 2022.  Consider SGLT2 for protective effect.  Overall education and awareness concerning secondary risk prevention was discussed in detail: LDL less than 70, hemoglobin A1c less than 7, blood pressure target less than 130/80 mmHg, >150 minutes of moderate aerobic activity per week, avoidance of smoking, weight control (via diet and exercise), and continued surveillance/management of/for obstructive sleep apnea.    Medication Adjustments/Labs and Tests Ordered: Current medicines are reviewed at length with the patient today.  Concerns regarding medicines are outlined above.  Orders Placed This Encounter  Procedures   EKG 12-Lead   No orders of the defined types were placed in this encounter.   Patient Instructions  Medication Instructions:  Your physician recommends that you continue on your current medications as directed. Please refer to the Current Medication list given to you today.  *If you need a refill on your cardiac medications before your next appointment, please call your pharmacy*  Lab Work: If you have labs (blood work) drawn today and your tests are completely normal, you will receive your results only by: [place] (if you have MyChart) OR A paper copy in the mail If you have any lab test that is abnormal or we need to change your treatment, we will call you to review the results.  Follow-Up: At [facility], you and your health needs are our priority.  As part of our  continuing mission to provide you with exceptional heart care, we have created designated Provider Care Teams.  These Care Teams include your primary  Cardiologist (physician) and Advanced Practice Providers (APPs -  Physician Assistants and Nurse Practitioners) who all work together to provide you with the care you need, when you need it.  We recommend signing up for the patient portal called "MyChart".  Sign up information is provided on this After Visit Summary.  MyChart is used to connect with patients for Virtual Visits (Telemedicine).  Patients are able to view lab/test results, encounter notes, upcoming appointments, etc.  Non-urgent messages can be sent to your provider as well.   To learn more about what you can do with MyChart, go to [email or web address].    Your next appointment:   1 year(s)  The format for your next appointment:   In Person  Provider:   You may see Sinclair Grooms, MD or one of the following Advanced Practice Providers on your designated Care Team:   Kathyrn Drown, NP   Signed, Sinclair Grooms, MD  04/30/2021 5:14 PM    [place]

## 2021-04-30 ENCOUNTER — Other Ambulatory Visit: Payer: Self-pay

## 2021-04-30 ENCOUNTER — Ambulatory Visit: Payer: Medicare Other | Admitting: Interventional Cardiology

## 2021-04-30 ENCOUNTER — Encounter: Payer: Self-pay | Admitting: Interventional Cardiology

## 2021-04-30 VITALS — BP 138/82 | HR 61 | Ht 71.0 in | Wt 240.8 lb

## 2021-04-30 DIAGNOSIS — D225 Melanocytic nevi of trunk: Secondary | ICD-10-CM | POA: Diagnosis not present

## 2021-04-30 DIAGNOSIS — E119 Type 2 diabetes mellitus without complications: Secondary | ICD-10-CM

## 2021-04-30 DIAGNOSIS — I10 Essential (primary) hypertension: Secondary | ICD-10-CM

## 2021-04-30 DIAGNOSIS — Z9861 Coronary angioplasty status: Secondary | ICD-10-CM

## 2021-04-30 DIAGNOSIS — E785 Hyperlipidemia, unspecified: Secondary | ICD-10-CM

## 2021-04-30 DIAGNOSIS — I2510 Atherosclerotic heart disease of native coronary artery without angina pectoris: Secondary | ICD-10-CM | POA: Diagnosis not present

## 2021-04-30 DIAGNOSIS — Z08 Encounter for follow-up examination after completed treatment for malignant neoplasm: Secondary | ICD-10-CM | POA: Diagnosis not present

## 2021-04-30 DIAGNOSIS — X32XXXD Exposure to sunlight, subsequent encounter: Secondary | ICD-10-CM | POA: Diagnosis not present

## 2021-04-30 DIAGNOSIS — L570 Actinic keratosis: Secondary | ICD-10-CM | POA: Diagnosis not present

## 2021-04-30 DIAGNOSIS — Z1283 Encounter for screening for malignant neoplasm of skin: Secondary | ICD-10-CM | POA: Diagnosis not present

## 2021-04-30 DIAGNOSIS — Z85820 Personal history of malignant melanoma of skin: Secondary | ICD-10-CM | POA: Diagnosis not present

## 2021-04-30 NOTE — Patient Instructions (Signed)
Medication Instructions:  Your physician recommends that you continue on your current medications as directed. Please refer to the Current Medication list given to you today.  *If you need a refill on your cardiac medications before your next appointment, please call your pharmacy*  Lab Work: If you have labs (blood work) drawn today and your tests are completely normal, you will receive your results only by: [place] (if you have MyChart) OR A paper copy in the mail If you have any lab test that is abnormal or we need to change your treatment, we will call you to review the results.  Follow-Up: At [facility], you and your health needs are our priority.  As part of our continuing mission to provide you with exceptional heart care, we have created designated Provider Care Teams.  These Care Teams include your primary Cardiologist (physician) and Advanced Practice Providers (APPs -  Physician Assistants and Nurse Practitioners) who all work together to provide you with the care you need, when you need it.  We recommend signing up for the patient portal called "MyChart".  Sign up information is provided on this After Visit Summary.  MyChart is used to connect with patients for Virtual Visits (Telemedicine).  Patients are able to view lab/test results, encounter notes, upcoming appointments, etc.  Non-urgent messages can be sent to your provider as well.   To learn more about what you can do with MyChart, go to [email or web address].    Your next appointment:   1 year(s)  The format for your next appointment:   In Person  Provider:   You may see Sinclair Grooms, MD or one of the following Advanced Practice Providers on your designated Care Team:   Kathyrn Drown, NP

## 2021-05-01 DIAGNOSIS — M47816 Spondylosis without myelopathy or radiculopathy, lumbar region: Secondary | ICD-10-CM | POA: Diagnosis not present

## 2021-05-08 DIAGNOSIS — H2513 Age-related nuclear cataract, bilateral: Secondary | ICD-10-CM | POA: Diagnosis not present

## 2021-05-29 ENCOUNTER — Other Ambulatory Visit: Payer: Self-pay | Admitting: Family Medicine

## 2021-05-29 DIAGNOSIS — I10 Essential (primary) hypertension: Secondary | ICD-10-CM

## 2021-05-30 ENCOUNTER — Other Ambulatory Visit: Payer: Self-pay | Admitting: *Deleted

## 2021-05-30 MED ORDER — HYDROCHLOROTHIAZIDE 25 MG PO TABS: ORAL_TABLET | ORAL | 0 refills | Status: DC

## 2021-06-02 ENCOUNTER — Other Ambulatory Visit: Payer: Self-pay | Admitting: Family Medicine

## 2021-06-06 ENCOUNTER — Other Ambulatory Visit: Payer: Self-pay | Admitting: Family Medicine

## 2021-06-06 DIAGNOSIS — B9689 Other specified bacterial agents as the cause of diseases classified elsewhere: Secondary | ICD-10-CM

## 2021-06-21 ENCOUNTER — Other Ambulatory Visit: Payer: Self-pay | Admitting: Family Medicine

## 2021-06-22 ENCOUNTER — Other Ambulatory Visit: Payer: Self-pay | Admitting: Family Medicine

## 2021-07-09 ENCOUNTER — Other Ambulatory Visit: Payer: Self-pay | Admitting: Family Medicine

## 2021-07-09 MED ORDER — POTASSIUM CHLORIDE CRYS ER 20 MEQ PO TBCR: EXTENDED_RELEASE_TABLET | ORAL | 0 refills | Status: DC

## 2021-07-22 ENCOUNTER — Ambulatory Visit (INDEPENDENT_AMBULATORY_CARE_PROVIDER_SITE_OTHER): Payer: Medicare Other

## 2021-07-22 ENCOUNTER — Other Ambulatory Visit: Payer: Self-pay

## 2021-07-22 VITALS — BP 128/70 | HR 70 | Temp 98.2°F | Ht 71.0 in | Wt 242.8 lb

## 2021-07-22 DIAGNOSIS — Z0000 Encounter for general adult medical examination without abnormal findings: Secondary | ICD-10-CM

## 2021-07-22 NOTE — Patient Instructions (Signed)
Mitchell Oliver , Thank you for taking time to come for your Medicare Wellness Visit. I appreciate your ongoing commitment to your health goals. Please review the following plan we discussed and let me know if I can assist you in the future.   Screening recommendations/referrals: Colonoscopy: Done 08/04/2019  Recommended yearly ophthalmology/optometry visit for glaucoma screening and checkup Recommended yearly dental visit for hygiene and checkup  Vaccinations: Influenza vaccine: Done 07/22/2021 Pneumococcal vaccine: Done 11/26/2012 and 08/28/2015  Tdap vaccine: Repeat in 10 years  Shingles vaccine: Done 06/26/2017 08/26/2017 09/17/2017 Covid-19: Declined  Advanced directives: Advance directive discussed with you today. Even though you declined this today, please call our office should you change your mind, and we can give you the proper paperwork for you to fill out.   Conditions/risks identified: Aim for 30 minutes of exercise or walking each day, drink 6-8 glasses of water and eat lots of fruits and vegetables.   Next appointment: Follow up in one year for your annual wellness visit. 2023  Preventive Care 65 Years and Older, Male  Preventive care refers to lifestyle choices and visits with your health care provider that can promote health and wellness. What does preventive care include? A yearly physical exam. This is also called an annual well check. Dental exams once or twice a year. Routine eye exams. Ask your health care provider how often you should have your eyes checked. Personal lifestyle choices, including: Daily care of your teeth and gums. Regular physical activity. Eating a healthy diet. Avoiding tobacco and drug use. Limiting alcohol use. Practicing safe sex. Taking low doses of aspirin every day. Taking vitamin and mineral supplements as recommended by your health care provider. What happens during an annual well check? The services and screenings done by your health  care provider during your annual well check will depend on your age, overall health, lifestyle risk factors, and family history of disease. Counseling  Your health care provider may ask you questions about your: Alcohol use. Tobacco use. Drug use. Emotional well-being. Home and relationship well-being. Sexual activity. Eating habits. History of falls. Memory and ability to understand (cognition). Work and work Statistician. Screening  You may have the following tests or measurements: Height, weight, and BMI. Blood pressure. Lipid and cholesterol levels. These may be checked every 5 years, or more frequently if you are over 35 years old. Skin check. Lung cancer screening. You may have this screening every year starting at age 29 if you have a 30-pack-year history of smoking and currently smoke or have quit within the past 15 years. Fecal occult blood test (FOBT) of the stool. You may have this test every year starting at age 37. Flexible sigmoidoscopy or colonoscopy. You may have a sigmoidoscopy every 5 years or a colonoscopy every 10 years starting at age 56. Prostate cancer screening. Recommendations will vary depending on your family history and other risks. Hepatitis C blood test. Hepatitis B blood test. Sexually transmitted disease (STD) testing. Diabetes screening. This is done by checking your blood sugar (glucose) after you have not eaten for a while (fasting). You may have this done every 1-3 years. Abdominal aortic aneurysm (AAA) screening. You may need this if you are a current or former smoker. Osteoporosis. You may be screened starting at age 8 if you are at high risk. Talk with your health care provider about your test results, treatment options, and if necessary, the need for more tests. Vaccines  Your health care provider may recommend certain vaccines, such  as: Influenza vaccine. This is recommended every year. Tetanus, diphtheria, and acellular pertussis (Tdap, Td)  vaccine. You may need a Td booster every 10 years. Zoster vaccine. You may need this after age 49. Pneumococcal 13-valent conjugate (PCV13) vaccine. One dose is recommended after age 26. Pneumococcal polysaccharide (PPSV23) vaccine. One dose is recommended after age 31. Talk to your health care provider about which screenings and vaccines you need and how often you need them. This information is not intended to replace advice given to you by your health care provider. Make sure you discuss any questions you have with your health care provider. Document Released: 11/08/2015 Document Revised: 07/01/2016 Document Reviewed: 08/13/2015 Elsevier Interactive Patient Education  2017 [place] Prevention in the Home Falls can cause injuries. They can happen to people of all ages. There are many things you can do to make your home safe and to help prevent falls. What can I do on the outside of my home? Regularly fix the edges of walkways and driveways and fix any cracks. Remove anything that might make you trip as you walk through a door, such as a raised step or threshold. Trim any bushes or trees on the path to your home. Use bright outdoor lighting. Clear any walking paths of anything that might make someone trip, such as rocks or tools. Regularly check to see if handrails are loose or broken. Make sure that both sides of any steps have handrails. Any raised decks and porches should have guardrails on the edges. Have any leaves, snow, or ice cleared regularly. Use sand or salt on walking paths during winter. Clean up any spills in your garage right away. This includes oil or grease spills. What can I do in the bathroom? Use night lights. Install grab bars by the toilet and in the tub and shower. Do not use towel bars as grab bars. Use non-skid mats or decals in the tub or shower. If you need to sit down in the shower, use a plastic, non-slip stool. Keep the floor dry. Clean up any  water that spills on the floor as soon as it happens. Remove soap buildup in the tub or shower regularly. Attach bath mats securely with double-sided non-slip rug tape. Do not have throw rugs and other things on the floor that can make you trip. What can I do in the bedroom? Use night lights. Make sure that you have a light by your bed that is easy to reach. Do not use any sheets or blankets that are too big for your bed. They should not hang down onto the floor. Have a firm chair that has side arms. You can use this for support while you get dressed. Do not have throw rugs and other things on the floor that can make you trip. What can I do in the kitchen? Clean up any spills right away. Avoid walking on wet floors. Keep items that you use a lot in easy-to-reach places. If you need to reach something above you, use a strong step stool that has a grab bar. Keep electrical cords out of the way. Do not use floor polish or wax that makes floors slippery. If you must use wax, use non-skid floor wax. Do not have throw rugs and other things on the floor that can make you trip. What can I do with my stairs? Do not leave any items on the stairs. Make sure that there are handrails on both sides of the stairs and use  them. Fix handrails that are broken or loose. Make sure that handrails are as long as the stairways. Check any carpeting to make sure that it is firmly attached to the stairs. Fix any carpet that is loose or worn. Avoid having throw rugs at the top or bottom of the stairs. If you do have throw rugs, attach them to the floor with carpet tape. Make sure that you have a light switch at the top of the stairs and the bottom of the stairs. If you do not have them, ask someone to add them for you. What else can I do to help prevent falls? Wear shoes that: Do not have high heels. Have rubber bottoms. Are comfortable and fit you well. Are closed at the toe. Do not wear sandals. If you use a  stepladder: Make sure that it is fully opened. Do not climb a closed stepladder. Make sure that both sides of the stepladder are locked into place. Ask someone to hold it for you, if possible. Clearly mark and make sure that you can see: Any grab bars or handrails. First and last steps. Where the edge of each step is. Use tools that help you move around (mobility aids) if they are needed. These include: Canes. Walkers. Scooters. Crutches. Turn on the lights when you go into a dark area. Replace any light bulbs as soon as they burn out. Set up your furniture so you have a clear path. Avoid moving your furniture around. If any of your floors are uneven, fix them. If there are any pets around you, be aware of where they are. Review your medicines with your doctor. Some medicines can make you feel dizzy. This can increase your chance of falling. Ask your doctor what other things that you can do to help prevent falls. This information is not intended to replace advice given to you by your health care provider. Make sure you discuss any questions you have with your health care provider. Document Released: 08/08/2009 Document Revised: 03/19/2016 Document Reviewed: 11/16/2014 Elsevier Interactive Patient Education  2017 [employer].

## 2021-07-22 NOTE — Progress Notes (Signed)
Subjective:   Mitchell Oliver is a 75 y.o. male who presents for Medicare Annual/Subsequent preventive examination.  Review of Systems     Cardiac Risk Factors include: advanced age (>82men, >8 women);dyslipidemia;diabetes mellitus;hypertension;male gender;sedentary lifestyle;obesity (BMI >30kg/m2)     Objective:    Today's Vitals   07/22/21 0834  BP: 128/70  Pulse: 70  Temp: 98.2 F (36.8 C)  SpO2: 95%  Weight: 242 lb 12.8 oz (110.1 kg)  Height: 5\' 11"  (1.803 m)   Body mass index is 33.86 kg/m.  Advanced Directives 07/22/2021 08/04/2019 07/22/2015 07/17/2015 07/16/2015 05/04/2014  Does Patient Have a Medical Advance Directive? No No No No No Patient does not have advance directive;Patient would not like information  Would patient like information on creating a medical advance directive? No - Patient declined No - Patient declined No - patient declined information No - patient declined information No - patient declined information -    Current Medications (verified) Outpatient Encounter Medications as of 07/22/2021  Medication Sig   albuterol (VENTOLIN HFA) 108 (90 Base) MCG/ACT inhaler INHALE 2 PUFFS INTO THE LUNGS EVERY 6 HOURS AS NEEDED FOR WHEEZING OR SHORTNESS OF BREATH   amLODipine (NORVASC) 10 MG tablet Take 1 tablet by mouth daily.   Ascorbic Acid (VITAMIN C PO) Take 4 tablets by mouth daily. Takes 4000 mg daily   atorvastatin (LIPITOR) 40 MG tablet Take 1 tablet by mouth daily.   benazepril (LOTENSIN) 20 MG tablet Take 1 tablet by mouth daily.   clopidogrel (PLAVIX) 75 MG tablet TAKE 1 TABLET(75 MG) BY MOUTH DAILY   Cyanocobalamin (VITAMIN B-12 CR) 1000 MCG TBCR Take 1 tablet by mouth daily.   dorzolamide-timolol (COSOPT) 22.3-6.8 MG/ML ophthalmic solution SMARTSIG:1 Drop(s) In Eye(s) Every 12 Hours   fish oil-omega-3 fatty acids 1000 MG capsule Take 2 g by mouth daily.   fluticasone (FLONASE) 50 MCG/ACT nasal spray SHAKE LIQUID AND USE 1 SPRAY IN EACH NOSTRIL DAILY AS  NEEDED FOR ALLERGIES OR RHINITIS   hydrochlorothiazide (HYDRODIURIL) 25 MG tablet TAKE 1 TABLET(25 MG) BY MOUTH EVERY MORNING   metFORMIN (GLUCOPHAGE-XR) 500 MG 24 hr tablet TAKE 1 TABLET(500 MG) BY MOUTH DAILY WITH BREAKFAST   metoprolol succinate (TOPROL-XL) 50 MG 24 hr tablet TAKE 1 TABLET BY MOUTH DAILY WITH OR IMMEDIATELY FOLLOWING A MEAL   nitroGLYCERIN (NITROSTAT) 0.4 MG SL tablet Place 1 tablet (0.4 mg total) under the tongue every 5 (five) minutes as needed for chest pain (maxium 3 tablets daily). Do not take within 36 hrs of Cialis use   potassium chloride SA (KLOR-CON) 20 MEQ tablet TAKE 1 TABLET(20 MEQ) BY MOUTH DAILY   Zinc Acetate 50 MG CAPS Take 50 mg by mouth daily.    [DISCONTINUED] amLODipine (NORVASC) 5 MG tablet TAKE 1 TABLET BY MOUTH EVERY DAY   [DISCONTINUED] atorvastatin (LIPITOR) 80 MG tablet TAKE 1 TABLET BY MOUTH DAILY   [DISCONTINUED] benazepril (LOTENSIN) 20 MG tablet TAKE 1 TABLET(20 MG) BY MOUTH DAILY   [DISCONTINUED] latanoprost (XALATAN) 0.005 % ophthalmic solution Place 1 drop into both eyes at bedtime.    [DISCONTINUED] pantoprazole (PROTONIX) 40 MG tablet TAKE 1 TABLET(40 MG) BY MOUTH EVERY MORNING   acetaminophen (TYLENOL) 325 MG tablet Take by mouth.   Cholecalciferol 25 MCG (1000 UT) tablet Take by mouth.   latanoprost (XALATAN) 0.005 % ophthalmic solution Apply to eye.   pantoprazole (PROTONIX) 20 MG tablet Take by mouth.   [DISCONTINUED] CIALIS 20 MG tablet TAKE 1 TABLET BY MOUTH DAILY IF NEEDED  FOR ERECTILE DYSFUNCTION, TAKE 2 HOURS TO INTERCOURSE (Patient not taking: Reported on 04/30/2021)   No facility-administered encounter medications on file as of 07/22/2021.    Allergies (verified) Patient has no known allergies.   History: Past Medical History:  Diagnosis Date   Allergy    COPD (chronic obstructive pulmonary disease) ([place])    CVA (cerebral vascular accident) ([place]) 2012   denies residual on 07/22/2015   ED (erectile dysfunction)    Family  history of adverse reaction to anesthesia    "think my mother had real bad headaches after anesthesia"   GERD (gastroesophageal reflux disease)    Hypercholesterolemia    Hypertension    IFG (impaired fasting glucose)    NSTEMI (non-ST elevated myocardial infarction) ([place])    Pneumonia ~ 2005   Past Surgical History:  Procedure Laterality Date   APPENDECTOMY  2009 [facility]   BACK SURGERY     BIOPSY  05/04/2014   Procedure: BIOPSY;  Surgeon: Danie Binder, MD;  [facility];  Service: Endoscopy;;   CARDIAC CATHETERIZATION N/A 07/18/2015   Procedure: Left Heart Cath and Coronary Angiography;  Surgeon: Belva Crome, MD;  Location: [facility];  Service: Cardiovascular;  Laterality: N/A;   CARDIAC CATHETERIZATION N/A 07/18/2015   Procedure: Coronary Stent Intervention;  Surgeon: Belva Crome, MD;  Location: [place] [facility];  Service: Cardiovascular;  Laterality: N/A;   CARDIAC CATHETERIZATION N/A 07/22/2015   Procedure: Coronary Stent Intervention;  Surgeon: Belva Crome, MD;  Location: [place] [facility];  Service: Cardiovascular;  Laterality: N/A;   COLONOSCOPY  2009   COLONOSCOPY N/A 08/04/2019   Procedure: COLONOSCOPY;  Surgeon: Danie Binder, MD;  [facility];  Service: Endoscopy;  Laterality: N/A;  12:30   ESOPHAGOGASTRODUODENOSCOPY N/A 05/04/2014   Procedure: ESOPHAGOGASTRODUODENOSCOPY (EGD);  Surgeon: Danie Binder, MD;  [facility];  Service: Endoscopy;  Laterality: N/A;  9:15   POLYPECTOMY  08/04/2019   Procedure: POLYPECTOMY;  Surgeon: Danie Binder, MD;  [facility];  Service: Endoscopy;;  hepatic flexure, ascending colon, transverse colon, splenic flexure, descending colon    POSTERIOR LAMINECTOMY / DECOMPRESSION LUMBAR SPINE  06/03/2015   SAVORY DILATION N/A 05/04/2014   Procedure: SAVORY DILATION;  Surgeon: Danie Binder, MD;  [facility];  Service: Endoscopy;  Laterality: N/A;   Family History  Problem  Relation Age of Onset   Hypertension Mother    Hypertension Father    Heart attack Father    Stroke Father    Colon cancer Neg Hx    Colon polyps Neg Hx    Social History   Socioeconomic History   Marital status: Divorced    Spouse name: Not on file   Number of children: 3   Years of education: Not on file   Highest education level: Not on file  Occupational History   Occupation: Sales  Tobacco Use   Smoking status: Former    Packs/day: 2.00    Years: 24.00    Pack years: 48.00    Types: Cigarettes    Start date: 1963    Quit date: 12/24/1985    Years since quitting: 35.6   Smokeless tobacco: Never  Substance and Sexual Activity   Alcohol use: Yes    Alcohol/week: 6.0 standard drinks    Types: 2 Glasses of wine, 4 Cans of beer per week   Drug use: No   Sexual activity: Yes  Other Topics  Concern   Not on file  Social History Narrative   3 daughters, 2 live locally, one lives in [place].   3 grandsons   Active in grandson's travel ball. Works part time for [employer].    Social Determinants of Health   Financial Resource Strain: Low Risk    Difficulty of Paying Living Expenses: Not hard at all  Food Insecurity: No Food Insecurity   Worried About Charity fundraiser in the Last Year: Never true   [place] in the Last Year: Never true  Transportation Needs: No Transportation Needs   Lack of Transportation (Medical): No   Lack of Transportation (Non-Medical): No  Physical Activity: Sufficiently Active   Days of Exercise per Week: 5 days   Minutes of Exercise per Session: 60 min  Stress: No Stress Concern Present   Feeling of Stress : Not at all  Social Connections: Moderately Integrated   Frequency of Communication with Friends and Family: More than three times a week   Frequency of Social Gatherings with Friends and Family: More than three times a week   Attends Religious Services: More than 4 times per year   Active Member of [employer] or  Organizations: Yes   Attends Music therapist: More than 4 times per year   Marital Status: Divorced    Tobacco Counseling Counseling given: Not Answered   Clinical Intake:     Pain : No/denies pain     BMI - recorded: 33.86 Nutritional Status: BMI > 30  Obese Nutritional Risks: None Diabetes: Yes  How often do you need to have someone help you when you read instructions, pamphlets, or other written materials from your doctor or pharmacy?: 1 - Never  Diabetic?Nutrition Risk Assessment:  Has the patient had any N/V/D within the last 2 months?   NO Does the patient have any non-healing wounds?  No  Has the patient had any unintentional weight loss or weight gain?  No   Diabetes:  Is the patient diabetic?  Yes  If diabetic, was a CBG obtained today?  No  Did the patient bring in their glucometer from home?  No  How often do you monitor your CBG's? DOES NOT MONITOR.   Financial Strains and Diabetes Management:  Are you having any financial strains with the device, your supplies or your medication? No .  Does the patient want to be seen by Chronic Care Management for management of their diabetes?  No  Would the patient like to be referred to a Nutritionist or for Diabetic Management?  No   Diabetic Exams:  Diabetic Eye Exam: Completed 06/2021.  Pt has been advised about the importance in completing this exam. Diabetic Foot Exam: Completed DUE. Pt has been advised about the importance in completing this exam.   Interpreter Needed?: No  Information entered by :: Karle Starch Cassell Voorhies, LPN   Activities of Daily Living In your present state of health, do you have any difficulty performing the following activities: 07/22/2021  Hearing? Y  Comment Wears hearing aids.  Vision? N  Difficulty concentrating or making decisions? N  Walking or climbing stairs? N  Dressing or bathing? N  Doing errands, shopping? N  Preparing Food and eating ? N  Using the Toilet? N   In the past six months, have you accidently leaked urine? N  Do you have problems with loss of bowel control? N  Managing your Medications? N  Managing your Finances? N  Housekeeping  or managing your Housekeeping? N  Some recent data might be hidden    Patient Care Team: Erven Colla, DO as PCP - General (Family Medicine) Belva Crome, MD as PCP - Cardiology (Cardiology) Danie Binder, MD (Inactive) as Consulting Physician (Gastroenterology)  Indicate any recent Medical Services you may have received from other than [facility] providers in the past year (date may be approximate).     Assessment:   This is a routine wellness examination for Mitchell Oliver.  Hearing/Vision screen Hearing Screening - Comments:: Hearing aids  Vision Screening - Comments:: Glasses, Dr. Luretha Rued, Last eye visit 06/2021.  Dietary issues and exercise activities discussed: Current Exercise Habits: The patient has a physically strenuous job, but has no regular exercise apart from work. (Pt states he walks 2-3 miles per day at work.), Exercise limited by: cardiac condition(s);respiratory conditions(s)   Goals Addressed             This Visit's Progress    Have 3 meals a day       Eat healthy meals       Depression Screen PHQ 2/9 Scores 07/22/2021 04/02/2020 07/19/2018 07/15/2017 07/07/2015  PHQ - 2 Score 0 0 0 0 0    Fall Risk Fall Risk  07/22/2021 04/02/2020 07/19/2018 07/07/2015  Falls in the past year? 0 0 No No  Number falls in past yr: 0 - - -  Injury with Fall? 0 - - -  Risk for fall due to : Impaired vision - - -  Follow up Falls prevention discussed Falls evaluation completed - -    FALL RISK PREVENTION PERTAINING TO THE HOME:  Any stairs in or around the home? No  If so, are there any without handrails? No  Home free of loose throw rugs in walkways, pet beds, electrical cords, etc? Yes  Adequate lighting in your home to reduce risk of falls? Yes   ASSISTIVE DEVICES UTILIZED TO PREVENT  FALLS:  Life alert? No  Use of a cane, walker or w/c? No  Grab bars in the bathroom? Yes  Shower chair or bench in shower? No  Elevated toilet seat or a handicapped toilet? No   TIMED UP AND GO:  Was the test performed? Yes .  Length of time to ambulate 10 feet: 5 sec.   Gait steady and fast without use of assistive device  Cognitive Function:     6CIT Screen 07/22/2021  What Year? 0 points  What month? 0 points  What time? 0 points  Count back from 20 0 points  Months in reverse 0 points  Repeat phrase 0 points  Total Score 0    Immunizations Immunization History  Administered Date(s) Administered   Fluad Quad(high Dose 65+) 07/09/2020   Influenza, High Dose Seasonal PF 08/08/2014, 07/25/2019   Influenza,inj,Quad PF,6+ Mos 07/03/2015, 07/06/2016, 07/15/2017, 07/19/2018   Influenza-Unspecified 08/23/2013, 07/22/2021   Pneumococcal Conjugate-13 08/28/2015   Pneumococcal Polysaccharide-23 11/26/2012   Zoster Recombinat (Shingrix) 06/26/2017, 08/26/2017, 09/17/2017   Zoster, Live 12/14/2012    TDAP status: Due, Education has been provided regarding the importance of this vaccine. Advised may receive this vaccine at local pharmacy or Health Dept. Aware to provide a copy of the vaccination record if obtained from local pharmacy or Health Dept. Verbalized acceptance and understanding.  Flu Vaccine status: Up to date  Pneumococcal vaccine status: Up to date  Covid-19 vaccine status: Declined, Education has been provided regarding the importance of this vaccine but patient still declined. Advised may  receive this vaccine at local pharmacy or Health Dept.or vaccine clinic. Aware to provide a copy of the vaccination record if obtained from local pharmacy or Health Dept. Verbalized acceptance and understanding.  Qualifies for Shingles Vaccine? Yes   Zostavax completed Yes   Shingrix Completed?: Yes  Screening Tests Health Maintenance  Topic Date Due   FOOT EXAM  Never done    TETANUS/TDAP  Never done   HEMOGLOBIN A1C  07/16/2021   OPHTHALMOLOGY EXAM  12/25/2021   INFLUENZA VACCINE  Completed   Hepatitis C Screening  Completed   Zoster Vaccines- Shingrix  Completed   HPV VACCINES  Aged Out   COVID-19 Vaccine  Discontinued    Health Maintenance  Health Maintenance Due  Topic Date Due   FOOT EXAM  Never done   TETANUS/TDAP  Never done   HEMOGLOBIN A1C  07/16/2021    Colorectal cancer screening: No longer required.   Lung Cancer Screening: (Low Dose CT Chest recommended if Age 55-80 years, 30 pack-year currently smoking OR have quit w/in 15years.) does not qualify.    Additional Screening:  Hepatitis C Screening: does qualify; Completed 12/31/2015  Vision Screening: Recommended annual ophthalmology exams for early detection of glaucoma and other disorders of the eye. Is the patient up to date with their annual eye exam?  Yes  Who is the provider or what is the name of the office in which the patient attends annual eye exams? Dr. Remus Blake If pt is not established with a provider, would they like to be referred to a provider to establish care? No .   Dental Screening: Recommended annual dental exams for proper oral hygiene  Community Resource Referral / Chronic Care Management: CRR required this visit?  No   CCM required this visit?  No      Plan:     I have personally reviewed and noted the following in the patient's chart:   Medical and social history Use of alcohol, tobacco or illicit drugs  Current medications and supplements including opioid prescriptions. Patient is not currently taking opioid prescriptions. Functional ability and status Nutritional status Physical activity Advanced directives List of other physicians Hospitalizations, surgeries, and ER visits in previous 12 months Vitals Screenings to include cognitive, depression, and falls Referrals and appointments  In addition, I have reviewed and discussed with patient  certain preventive protocols, quality metrics, and best practice recommendations. A written personalized care plan for preventive services as well as general preventive health recommendations were provided to patient.     Chriss Driver, LPN   8/75/6433   Nurse Notes: Pt states he is doing well. Flu vaccine given today. Pt advised how to obtain Td. Pt is due for foot exam. Up to date on all other wellness exams.

## 2021-07-30 DIAGNOSIS — Z08 Encounter for follow-up examination after completed treatment for malignant neoplasm: Secondary | ICD-10-CM | POA: Diagnosis not present

## 2021-07-30 DIAGNOSIS — X32XXXD Exposure to sunlight, subsequent encounter: Secondary | ICD-10-CM | POA: Diagnosis not present

## 2021-07-30 DIAGNOSIS — Z1283 Encounter for screening for malignant neoplasm of skin: Secondary | ICD-10-CM | POA: Diagnosis not present

## 2021-07-30 DIAGNOSIS — Z85820 Personal history of malignant melanoma of skin: Secondary | ICD-10-CM | POA: Diagnosis not present

## 2021-07-30 DIAGNOSIS — D225 Melanocytic nevi of trunk: Secondary | ICD-10-CM | POA: Diagnosis not present

## 2021-07-30 DIAGNOSIS — L570 Actinic keratosis: Secondary | ICD-10-CM | POA: Diagnosis not present

## 2021-07-30 DIAGNOSIS — L820 Inflamed seborrheic keratosis: Secondary | ICD-10-CM | POA: Diagnosis not present

## 2021-08-07 ENCOUNTER — Ambulatory Visit (INDEPENDENT_AMBULATORY_CARE_PROVIDER_SITE_OTHER): Payer: Medicare Other | Admitting: Family Medicine

## 2021-08-07 ENCOUNTER — Other Ambulatory Visit: Payer: Self-pay

## 2021-08-07 ENCOUNTER — Encounter: Payer: Medicare Other | Admitting: Family Medicine

## 2021-08-07 VITALS — BP 129/78 | HR 72 | Ht 71.0 in | Wt 244.2 lb

## 2021-08-07 DIAGNOSIS — E785 Hyperlipidemia, unspecified: Secondary | ICD-10-CM

## 2021-08-07 DIAGNOSIS — E1159 Type 2 diabetes mellitus with other circulatory complications: Secondary | ICD-10-CM | POA: Diagnosis not present

## 2021-08-07 DIAGNOSIS — I10 Essential (primary) hypertension: Secondary | ICD-10-CM

## 2021-08-07 DIAGNOSIS — D72829 Elevated white blood cell count, unspecified: Secondary | ICD-10-CM

## 2021-08-07 MED ORDER — EMPAGLIFLOZIN 10 MG PO TABS: 10.0000 mg | ORAL_TABLET | Freq: Every day | ORAL | 1 refills | Status: DC

## 2021-08-07 NOTE — Assessment & Plan Note (Signed)
At goal.  Well-controlled. Followed by cardiology as well.  Continue benazepril, HCTZ, metoprolol, amlodipine.

## 2021-08-07 NOTE — Assessment & Plan Note (Addendum)
Lipids at goal.  Most recent LDL was 52.  Continue Lipitor daily.

## 2021-08-07 NOTE — Progress Notes (Signed)
Subjective:  Patient ID: Mitchell Oliver, male    DOB: [birth year]  Age: 75 y.o. MRN: [id number]  CC: Chief Complaint  Patient presents with   Diabetes    HTN and Hyperlipidemia follow up- establish care Patient requesting his routine labs No problems or concerns per patient      HPI:  75 year old male presents for follow-up and to establish care with me.  Patient states that he is doing well at this time.  He would like to discuss whether he needs to continue the metformin.  He states that he is unsure if he is diabetic or not.  Denies chest pain.  Denies shortness of breath.  He states that he is staying active.  Chronic medical issues discussed.  See below.  Hypertension His blood pressure continues to be well controlled.  He is on 4 antihypertensives: Amlodipine, HCTZ, metoprolol, and benazepril.  He states that he is doing well.  Denies any side effects.  Follows closely with cardiology.  Type 2 diabetes I discussed with the patient that he meets criteria for the diagnosis of type 2 diabetes as he has previously had an A1c that was greater than 6.5.  His A1c in June 2021 was 6.8. He endorses compliance with metformin.  Patient states that he does not like metformin.  He denies any side effects.  He states that he has heard a lot of negative things about the drug.  He would like to discuss possible discontinuation today. Recently, his cardiologist suggested use of SGLT2 for protective effect.    Hyperlipidemia/dyslipidemia Patient's most recent blood work revealed an LDL of 52.  He is doing well on Lipitor without side effects.  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   CAD S/P percutaneous coronary angioplasty 07/22/2015   NSTEMI (non-ST elevated myocardial infarction) ([place]) 07/17/2015   Essential hypertension, benign 06/22/2013   Dyslipidemia 06/22/2013   Type 2  diabetes mellitus with other circulatory complications ([place]) 74/16/3845   Erectile dysfunction 06/22/2013   History of CVA 2012 06/22/2013    Social Hx   Social History   Socioeconomic History   Marital status: Divorced    Spouse name: Not on file   Number of children: 3   Years of education: Not on file   Highest education level: Not on file  Occupational History   Occupation: Sales  Tobacco Use   Smoking status: Former    Packs/day: 2.00    Years: 24.00    Pack years: 48.00    Types: Cigarettes    Start date: 1963    Quit date: 12/24/1985    Years since quitting: 35.6   Smokeless tobacco: Never  Substance and Sexual Activity   Alcohol use: Yes    Alcohol/week: 6.0 standard drinks    Types: 2 Glasses of wine, 4 Cans of beer per week   Drug use: No   Sexual activity: Yes  Other Topics Concern   Not on file  Social History Narrative   3 daughters, 2 live locally, one lives in [place].   3 grandsons   Active in grandson's travel ball. Works part time for [employer].    Social Determinants of Health   Financial Resource Strain: Low Risk    Difficulty of Paying Living Expenses: Not hard at all  Food Insecurity: No Food Insecurity   Worried About Charity fundraiser  in the Last Year: Never true   [place] in the Last Year: Never true  Transportation Needs: No Transportation Needs   Lack of Transportation (Medical): No   Lack of Transportation (Non-Medical): No  Physical Activity: Sufficiently Active   Days of Exercise per Week: 5 days   Minutes of Exercise per Session: 60 min  Stress: No Stress Concern Present   Feeling of Stress : Not at all  Social Connections: Moderately Integrated   Frequency of Communication with Friends and Family: More than three times a week   Frequency of Social Gatherings with Friends and Family: More than three times a week   Attends Religious Services: More than 4 times per year   Active Member of [employer] or  Organizations: Yes   Attends Music therapist: More than 4 times per year   Marital Status: Divorced    Review of Systems  Constitutional: Negative.   Respiratory: Negative.    Cardiovascular: Negative.     Objective:  BP 129/78   Pulse 72   Ht '5\' 11"'  (1.803 m)   Wt 244 lb 3.2 oz (110.8 kg)   SpO2 97%   BMI 34.06 kg/m   BP/Weight 08/07/2021 1/69/6789 01/01/1016  Systolic BP 510 258 527  Diastolic BP 78 70 82  Wt. (Lbs) 244.2 242.8 240.8  BMI 34.06 33.86 33.58    Physical Exam Vitals and nursing note reviewed.  Constitutional:      General: He is not in acute distress.    Appearance: Normal appearance. He is obese. He is not ill-appearing.  HENT:     Head: Normocephalic and atraumatic.  Eyes:     General:        Right eye: No discharge.        Left eye: No discharge.     Conjunctiva/sclera: Conjunctivae normal.  Cardiovascular:     Rate and Rhythm: Normal rate and regular rhythm.     Heart sounds: No murmur heard. Pulmonary:     Effort: Pulmonary effort is normal.     Breath sounds: Normal breath sounds. No wheezing, rhonchi or rales.  Neurological:     Mental Status: He is alert.  Diabetic Foot Check -  Appearance - no lesions, ulcers or calluses Skin - no unusual pallor or redness Monofilament testing -  Right - Great toe, medial, central, lateral ball and posterior foot intact Left - Great toe, medial, central, lateral ball and posterior foot intact   Lab Results  Component Value Date   WBC 11.1 (H) 01/13/2021   HGB 14.4 01/13/2021   HCT 42.4 01/13/2021   PLT 282 01/13/2021   GLUCOSE 77 01/13/2021   CHOL 106 01/13/2021   TRIG 95 01/13/2021   HDL 36 (L) 01/13/2021   LDLCALC 52 01/13/2021   ALT 33 01/13/2021   AST 34 01/13/2021   NA 136 01/13/2021   K 4.6 01/13/2021   CL 102 01/13/2021   CREATININE 0.98 01/13/2021   BUN 11 01/13/2021   CO2 17 (L) 01/13/2021   TSH 0.969 07/17/2015   PSA 0.71 07/03/2014   INR 1.11 07/18/2015    HGBA1C 6.3 (H) 01/13/2021     Assessment & Plan:   Problem List Items Addressed This Visit       Cardiovascular and Mediastinum   Essential hypertension, benign    At goal.  Well-controlled. Followed by cardiology as well.  Continue benazepril, HCTZ, metoprolol, amlodipine.      Type 2 diabetes mellitus with other  circulatory complications ([place]) - Primary    Most recent A1c 6.3.  He is at goal.  A1c today. He is taking 500 mg of metformin daily.  Patient states that he does not like the metformin and states that he would like to discuss discontinuation. We discussed this today.  I recommended continuing the metformin and adding Jardiance for cardiac and renal protection.  Patient was okay with starting Jardiance but wanted to discontinue metformin.  Starting Jardiance 10 mg daily.      Relevant Medications   empagliflozin (JARDIANCE) 10 MG TABS tablet   Other Relevant Orders   Microalbumin / creatinine urine ratio   Hemoglobin A1c   CMP14+EGFR     Other   Dyslipidemia    Lipids at goal.  Most recent LDL was 52.  Continue Lipitor daily.      Relevant Orders   Lipid panel   Other Visit Diagnoses     Leukocytosis, unspecified type       Relevant Orders   CBC       Meds ordered this encounter  Medications   empagliflozin (JARDIANCE) 10 MG TABS tablet    Sig: Take 1 tablet (10 mg total) by mouth daily.    Dispense:  90 tablet    Refill:  1    Follow-up:  Return in about 6 months (around 02/05/2022) for Diabetes follow up, HTN follow up.  [place]

## 2021-08-07 NOTE — Assessment & Plan Note (Signed)
Most recent A1c 6.3.  He is at goal.  A1c today. He is taking 500 mg of metformin daily.  Patient states that he does not like the metformin and states that he would like to discuss discontinuation. We discussed this today.  I recommended continuing the metformin and adding Jardiance for cardiac and renal protection.  Patient was okay with starting Jardiance but wanted to discontinue metformin.  Starting Jardiance 10 mg daily.

## 2021-08-07 NOTE — Patient Instructions (Signed)
I have put a new medication (Jardiance) in place of the metformin (since you do not prefer the metformin).  Stay hydrated. If you develop any urinary symptoms or rash in the groin let me know.  Follow up in 6 months.  Get labs on your way out.  Take care  Dr. Lacinda Axon

## 2021-08-07 NOTE — Addendum Note (Signed)
Addended by: Dairl Ponder on: 08/07/2021 10:06 AM   Modules accepted: Orders

## 2021-08-08 LAB — CMP14+EGFR
ALT: 26 IU/L (ref 0–44)
AST: 24 IU/L (ref 0–40)
Albumin/Globulin Ratio: 1.6 (ref 1.2–2.2)
Albumin: 4.2 g/dL (ref 3.7–4.7)
Alkaline Phosphatase: 71 IU/L (ref 44–121)
BUN/Creatinine Ratio: 9 — ABNORMAL LOW (ref 10–24)
BUN: 10 mg/dL (ref 8–27)
Bilirubin Total: 1.3 mg/dL — ABNORMAL HIGH (ref 0.0–1.2)
CO2: 18 mmol/L — ABNORMAL LOW (ref 20–29)
Calcium: 10.1 mg/dL (ref 8.6–10.2)
Chloride: 99 mmol/L (ref 96–106)
Creatinine, Ser: 1.13 mg/dL (ref 0.76–1.27)
Globulin, Total: 2.7 g/dL (ref 1.5–4.5)
Glucose: 121 mg/dL — ABNORMAL HIGH (ref 70–99)
Potassium: 4.6 mmol/L (ref 3.5–5.2)
Sodium: 133 mmol/L — ABNORMAL LOW (ref 134–144)
Total Protein: 6.9 g/dL (ref 6.0–8.5)
eGFR: 67 mL/min/{1.73_m2} (ref >59)

## 2021-08-08 LAB — LIPID PANEL
Chol/HDL Ratio: 2.6 ratio (ref 0.0–5.0)
Cholesterol, Total: 98 mg/dL — ABNORMAL LOW (ref 100–199)
HDL: 38 mg/dL — ABNORMAL LOW (ref >39)
LDL Chol Calc (NIH): 46 mg/dL (ref 0–99)
Triglycerides: 63 mg/dL (ref 0–149)
VLDL Cholesterol Cal: 14 mg/dL (ref 5–40)

## 2021-08-08 LAB — CBC
Hematocrit: 44.3 % (ref 37.5–51.0)
Hemoglobin: 14.7 g/dL (ref 13.0–17.7)
MCH: 29.5 pg (ref 26.6–33.0)
MCHC: 33.2 g/dL (ref 31.5–35.7)
MCV: 89 fL (ref 79–97)
Platelets: 232 10*3/uL (ref 150–450)
RBC: 4.98 x10E6/uL (ref 4.14–5.80)
RDW: 13 % (ref 11.6–15.4)
WBC: 10.5 10*3/uL (ref 3.4–10.8)

## 2021-08-08 LAB — MICROALBUMIN / CREATININE URINE RATIO
Creatinine, Urine: 97.1 mg/dL
Microalb/Creat Ratio: 130 mg/g creat — ABNORMAL HIGH (ref 0–29)
Microalbumin, Urine: 126.1 ug/mL

## 2021-08-08 LAB — HEMOGLOBIN A1C
Est. average glucose Bld gHb Est-mCnc: 146 mg/dL
Hgb A1c MFr Bld: 6.7 % — ABNORMAL HIGH (ref 4.8–5.6)

## 2021-08-27 ENCOUNTER — Other Ambulatory Visit: Payer: Self-pay | Admitting: Family Medicine

## 2021-08-27 DIAGNOSIS — I10 Essential (primary) hypertension: Secondary | ICD-10-CM

## 2021-09-04 ENCOUNTER — Other Ambulatory Visit: Payer: Self-pay | Admitting: Family Medicine

## 2021-09-20 ENCOUNTER — Other Ambulatory Visit: Payer: Self-pay | Admitting: Family Medicine

## 2021-09-30 ENCOUNTER — Other Ambulatory Visit: Payer: Self-pay | Admitting: Family Medicine

## 2021-10-01 DIAGNOSIS — H40053 Ocular hypertension, bilateral: Secondary | ICD-10-CM | POA: Diagnosis not present

## 2021-10-08 ENCOUNTER — Other Ambulatory Visit: Payer: Self-pay | Admitting: Family Medicine

## 2021-10-31 ENCOUNTER — Other Ambulatory Visit: Payer: Self-pay | Admitting: Family Medicine

## 2021-10-31 DIAGNOSIS — Z9109 Other allergy status, other than to drugs and biological substances: Secondary | ICD-10-CM

## 2021-11-12 ENCOUNTER — Other Ambulatory Visit: Payer: Self-pay | Admitting: Family Medicine

## 2021-11-29 ENCOUNTER — Other Ambulatory Visit: Payer: Self-pay | Admitting: Family Medicine

## 2021-11-29 DIAGNOSIS — I10 Essential (primary) hypertension: Secondary | ICD-10-CM

## 2021-12-03 ENCOUNTER — Other Ambulatory Visit: Payer: Self-pay | Admitting: Family Medicine

## 2021-12-09 ENCOUNTER — Ambulatory Visit (INDEPENDENT_AMBULATORY_CARE_PROVIDER_SITE_OTHER): Payer: Medicare Other | Admitting: Family Medicine

## 2021-12-09 ENCOUNTER — Encounter: Payer: Self-pay | Admitting: Family Medicine

## 2021-12-09 ENCOUNTER — Other Ambulatory Visit: Payer: Self-pay

## 2021-12-09 DIAGNOSIS — J988 Other specified respiratory disorders: Secondary | ICD-10-CM | POA: Diagnosis not present

## 2021-12-09 DIAGNOSIS — B9789 Other viral agents as the cause of diseases classified elsewhere: Secondary | ICD-10-CM | POA: Diagnosis not present

## 2021-12-09 MED ORDER — CETIRIZINE HCL 5 MG PO TABS: 5.0000 mg | ORAL_TABLET | Freq: Every day | ORAL | 0 refills | Status: DC

## 2021-12-09 MED ORDER — PROMETHAZINE-DM 6.25-15 MG/5ML PO SYRP: 5.0000 mL | ORAL_SOLUTION | Freq: Four times a day (QID) | ORAL | 0 refills | Status: DC | PRN

## 2021-12-09 NOTE — Patient Instructions (Signed)
Continue flonase.  Medication as prescribed.  Call if you continue to have symptoms and do not seem to be improving. This is likely a viral respiratory infection that will resolve quickly.  Take care  Dr. Lacinda Axon

## 2021-12-09 NOTE — Progress Notes (Signed)
Subjective:  Patient ID: Mitchell Oliver, male    DOB: [birth year]  Age: 76 y.o. MRN: [id number]  CC: Chief Complaint  Patient presents with   Sinusitis    Ears stopped up, sore throat, sinus drainage began yesterday.     HPI:  76 year old male presents for evaluation of respiratory symptoms.  Patient reports that his symptoms started yesterday.  He reports sore throat, hoarseness, congestion, cough.  No medications or interventions tried.  No fever.  No shortness of breath.  No relieving factors.  No reported sick contacts.  No other complaints or concerns at this time.  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 ([place]) 05/06/2020   COPD with emphysema ([place]) 07/23/2015   CAD S/P percutaneous coronary angioplasty 07/22/2015   Essential hypertension, benign 06/22/2013   Dyslipidemia 06/22/2013   Type 2 diabetes mellitus with other circulatory complications ([place]) 47/65/4650   Erectile dysfunction 06/22/2013   History of CVA 2012 06/22/2013    Social Hx   Social History   Socioeconomic History   Marital status: Divorced    Spouse name: Not on file   Number of children: 3   Years of education: Not on file   Highest education level: Not on file  Occupational History   Occupation: Sales  Tobacco Use   Smoking status: Former    Packs/day: 2.00    Years: 24.00    Pack years: 48.00    Types: Cigarettes    Start date: 1963    Quit date: 12/24/1985    Years since quitting: 35.9   Smokeless tobacco: Never  Substance and Sexual Activity   Alcohol use: Yes    Alcohol/week: 6.0 standard drinks    Types: 2 Glasses of wine, 4 Cans of beer per week   Drug use: No   Sexual activity: Yes  Other Topics Concern   Not on file  Social History Narrative   3 daughters, 2 live locally, one lives in [place].   3 grandsons   Active in grandson's travel ball. Works part time for [employer].     Social Determinants of Health   Financial Resource Strain: Low Risk    Difficulty of Paying Living Expenses: Not hard at all  Food Insecurity: No Food Insecurity   Worried About Charity fundraiser in the Last Year: Never true   [place] in the Last Year: Never true  Transportation Needs: No Transportation Needs   Lack of Transportation (Medical): No   Lack of Transportation (Non-Medical): No  Physical Activity: Sufficiently Active   Days of Exercise per Week: 5 days   Minutes of Exercise per Session: 60 min  Stress: No Stress Concern Present   Feeling of Stress : Not at all  Social Connections: Moderately Integrated   Frequency of Communication with Friends and Family: More than three times a week   Frequency of Social Gatherings with Friends and Family: More than three times a week   Attends Religious Services: More than 4 times per year   Active Member of Clubs or Organizations: Yes   Attends Music therapist: More than 4 times per year   Marital Status: Divorced    Review of Systems Per HPI  Objective:  BP 138/76    Pulse 66    Temp 98 F (36.7 C)    Wt 246 lb (111.6 kg)  SpO2 95%    BMI 34.31 kg/m   BP/Weight 12/09/2021 08/07/2021 3/53/6144  Systolic BP 315 400 867  Diastolic BP 76 78 70  Wt. (Lbs) 246 244.2 242.8  BMI 34.31 34.06 33.86    Physical Exam Vitals and nursing note reviewed.  Constitutional:      General: He is not in acute distress.    Appearance: Normal appearance. He is not ill-appearing.  HENT:     Head: Normocephalic and atraumatic.     Right Ear: Tympanic membrane normal.     Left Ear: Tympanic membrane normal.     Mouth/Throat:     Pharynx: Oropharynx is clear.  Eyes:     General:        Right eye: No discharge.        Left eye: No discharge.     Conjunctiva/sclera: Conjunctivae normal.  Cardiovascular:     Rate and Rhythm: Normal rate and regular rhythm.  Pulmonary:     Effort: Pulmonary effort is normal.      Breath sounds: Normal breath sounds. No wheezing, rhonchi or rales.  Neurological:     Mental Status: He is alert.    Lab Results  Component Value Date   WBC 10.5 08/07/2021   HGB 14.7 08/07/2021   HCT 44.3 08/07/2021   PLT 232 08/07/2021   GLUCOSE 121 (H) 08/07/2021   CHOL 98 (L) 08/07/2021   TRIG 63 08/07/2021   HDL 38 (L) 08/07/2021   LDLCALC 46 08/07/2021   ALT 26 08/07/2021   AST 24 08/07/2021   NA 133 (L) 08/07/2021   K 4.6 08/07/2021   CL 99 08/07/2021   CREATININE 1.13 08/07/2021   BUN 10 08/07/2021   CO2 18 (L) 08/07/2021   TSH 0.969 07/17/2015   PSA 0.71 07/03/2014   INR 1.11 07/18/2015   HGBA1C 6.7 (H) 08/07/2021     Assessment & Plan:   Problem List Items Addressed This Visit       Respiratory   Viral respiratory infection    This is likely viral in origin.  To early for this to be bacterial in origin given benign exam.  No evidence of COPD exacerbation.  Treating with Zyrtec and Promethazine DM.       Meds ordered this encounter  Medications   cetirizine (ZYRTEC) 5 MG tablet    Sig: Take 1 tablet (5 mg total) by mouth daily.    Dispense:  30 tablet    Refill:  0   promethazine-dextromethorphan (PROMETHAZINE-DM) 6.25-15 MG/5ML syrup    Sig: Take 5 mLs by mouth 4 (four) times daily as needed for cough.    Dispense:  118 mL    Refill:  [facility]

## 2021-12-09 NOTE — Assessment & Plan Note (Addendum)
This is likely viral in origin.  To early for this to be bacterial in origin given benign exam.  No evidence of COPD exacerbation.  Treating with Zyrtec and Promethazine DM.

## 2021-12-12 ENCOUNTER — Ambulatory Visit (INDEPENDENT_AMBULATORY_CARE_PROVIDER_SITE_OTHER): Payer: Medicare Other | Admitting: Family Medicine

## 2021-12-12 ENCOUNTER — Telehealth: Payer: Self-pay | Admitting: Family Medicine

## 2021-12-12 ENCOUNTER — Other Ambulatory Visit: Payer: Self-pay

## 2021-12-12 VITALS — HR 80 | Temp 98.2°F | Wt 238.4 lb

## 2021-12-12 DIAGNOSIS — R6889 Other general symptoms and signs: Secondary | ICD-10-CM | POA: Diagnosis not present

## 2021-12-12 DIAGNOSIS — J019 Acute sinusitis, unspecified: Secondary | ICD-10-CM

## 2021-12-12 MED ORDER — AMOXICILLIN-POT CLAVULANATE 875-125 MG PO TABS: 1.0000 | ORAL_TABLET | Freq: Two times a day (BID) | ORAL | 0 refills | Status: DC

## 2021-12-12 NOTE — Telephone Encounter (Signed)
Please tell patient that I am certainly concerned regarding what is going on with him.  I would feel much better about having him come by here to be seen so I can listen to his lungs, we can send in an antibiotic at that time, but we need to make sure there is not something worse going on then what meets the eye

## 2021-12-12 NOTE — Telephone Encounter (Signed)
Patient scheduled office visit with Dr Nicki Reaper today at 4:30pm for recheck

## 2021-12-12 NOTE — Progress Notes (Addendum)
° °  Subjective:    Patient ID: Mitchell Oliver, male    DOB: [birth year], 76 y.o.   MRN: [id number]  HPI Cough is not better, nose is runny/stuffy. Fever on Wednesday night. Sore throat and ears are stopped. Patient was seen on 12/09/21. Sinus pressure symptoms present for the past 5 days some fever over the past couple days worried about the possibility of pneumonia no fever today no wheezing or difficulty breathing.  No shortness of breath currently.  Denies any exposure to COVID  Review of Systems     Objective:   Physical Exam  General-in no acute distress Eyes-no discharge Lungs-respiratory rate normal, CTA CV-no murmurs,RRR Extremities skin warm dry no edema Neuro grossly normal Behavior normal, alert       Assessment & Plan:  Acute rhinosinusitis I doubt pneumonia I do not hear any Antibiotics prescribed warning signs discussed COVID test ordered Follow-up if progressive troubles or problems  Patients COVID test came back positive.  I talked with him late Saturday night he stated he was feeling better.  After shared discussion medication will be called in Sunday morning to minimize risk of COVID complications molnupiravir was sent into the pharmacy

## 2021-12-12 NOTE — Telephone Encounter (Signed)
Patient called to follow up on previous visit; still not feeling well. Chest congestion; concerned about it developing into pneumonia. Wants to know if provider will call something in.  Please advise at [phone]

## 2021-12-13 ENCOUNTER — Other Ambulatory Visit: Payer: Self-pay | Admitting: Family Medicine

## 2021-12-13 LAB — NOVEL CORONAVIRUS, NAA: SARS-CoV-2, NAA: DETECTED — AB

## 2021-12-14 MED ORDER — MOLNUPIRAVIR EUA 200MG CAPSULE: 4.0000 | ORAL_CAPSULE | Freq: Two times a day (BID) | ORAL | 0 refills | Status: AC

## 2021-12-14 NOTE — Addendum Note (Signed)
Addended by: Sallee Lange A on: 12/14/2021 11:07 AM   Modules accepted: Orders

## 2021-12-16 ENCOUNTER — Other Ambulatory Visit: Payer: Self-pay | Admitting: Family Medicine

## 2021-12-19 ENCOUNTER — Other Ambulatory Visit: Payer: Self-pay | Admitting: Family Medicine

## 2022-01-05 ENCOUNTER — Other Ambulatory Visit: Payer: Self-pay | Admitting: Family Medicine

## 2022-02-25 ENCOUNTER — Other Ambulatory Visit: Payer: Self-pay | Admitting: Family Medicine

## 2022-03-02 ENCOUNTER — Other Ambulatory Visit: Payer: Self-pay | Admitting: Family Medicine

## 2022-03-02 DIAGNOSIS — I10 Essential (primary) hypertension: Secondary | ICD-10-CM

## 2022-03-17 ENCOUNTER — Ambulatory Visit (INDEPENDENT_AMBULATORY_CARE_PROVIDER_SITE_OTHER): Payer: Medicare Other | Admitting: Family Medicine

## 2022-03-17 DIAGNOSIS — J329 Chronic sinusitis, unspecified: Secondary | ICD-10-CM | POA: Diagnosis not present

## 2022-03-17 DIAGNOSIS — J310 Chronic rhinitis: Secondary | ICD-10-CM | POA: Diagnosis not present

## 2022-03-17 MED ORDER — DOXYCYCLINE HYCLATE 100 MG PO TABS: 100.0000 mg | ORAL_TABLET | Freq: Two times a day (BID) | ORAL | 0 refills | Status: DC

## 2022-03-17 NOTE — Progress Notes (Signed)
Subjective:  Patient ID: Mitchell Oliver, male    DOB: 27-Nov-1945  Age: 76 y.o. MRN: [id number]  CC: Chief Complaint  Patient presents with   Nasal Congestion    Started on Saturday   Sinus Problem   ears stopped up   Cough    HPI:  76 year old male with type 2 diabetes, hypertension, CAD, COPD, dyslipidemia who presents for evaluation of the above.  Symptoms started on Saturday.  He reports sore throat, sinus pressure and congestion, and associated cough.  He has had some shortness of breath.  No fever.  He also reports decreased hearing.  He is having difficulty hearing out of both ears.  No relieving factors.  No reported contacts.  No other complaints.  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 ([place]) 05/06/2020   COPD with emphysema ([place]) 07/23/2015   CAD S/P percutaneous coronary angioplasty 07/22/2015   Essential hypertension, benign 06/22/2013   Dyslipidemia 06/22/2013   Type 2 diabetes mellitus with other circulatory complications ([place]) 23/55/7322   Erectile dysfunction 06/22/2013   History of CVA 2012 06/22/2013    Social Hx   Social History   Socioeconomic History   Marital status: Divorced    Spouse name: Not on file   Number of children: 3   Years of education: Not on file   Highest education level: Not on file  Occupational History   Occupation: Sales  Tobacco Use   Smoking status: Former    Packs/day: 2.00    Years: 24.00    Pack years: 48.00    Types: Cigarettes    Start date: 1963    Quit date: 12/24/1985    Years since quitting: 36.2   Smokeless tobacco: Never  Substance and Sexual Activity   Alcohol use: Yes    Alcohol/week: 6.0 standard drinks    Types: 2 Glasses of wine, 4 Cans of beer per week   Drug use: No   Sexual activity: Yes  Other Topics Concern   Not on file  Social History Narrative   3 daughters, 2 live locally, one lives in [place].   3  grandsons   Active in grandson's travel ball. Works part time for [employer].    Social Determinants of Health   Financial Resource Strain: Low Risk    Difficulty of Paying Living Expenses: Not hard at all  Food Insecurity: No Food Insecurity   Worried About Charity fundraiser in the Last Year: Never true   [place] in the Last Year: Never true  Transportation Needs: No Transportation Needs   Lack of Transportation (Medical): No   Lack of Transportation (Non-Medical): No  Physical Activity: Sufficiently Active   Days of Exercise per Week: 5 days   Minutes of Exercise per Session: 60 min  Stress: No Stress Concern Present   Feeling of Stress : Not at all  Social Connections: Moderately Integrated   Frequency of Communication with Friends and Family: More than three times a week   Frequency of Social Gatherings with Friends and Family: More than three times a week   Attends Religious Services: More than 4 times per year   Active Member of [employer] or Organizations: Yes   Attends Music therapist: More than 4 times per year   Marital Status: Divorced    Review of Systems Per HPI  Objective:  BP 127/69  Pulse (!) 48   Temp 97.8 F (36.6 C) (Oral)   Wt 236 lb (107 kg)   SpO2 96%   BMI 32.92 kg/m      03/17/2022    4:21 PM 12/12/2021    4:19 PM 12/09/2021   10:35 AM  BP/Weight  Systolic BP 833  825  Diastolic BP 69  76  Wt. (Lbs) 236 238.4 246  BMI 32.92 kg/m2 33.25 kg/m2 34.31 kg/m2    Physical Exam Vitals and nursing note reviewed.  Constitutional:      General: He is not in acute distress.    Appearance: Normal appearance.  HENT:     Head: Normocephalic and atraumatic.     Right Ear: Tympanic membrane normal.     Left Ear: Tympanic membrane normal.     Nose: Congestion present.     Mouth/Throat:     Pharynx: Oropharynx is clear.  Cardiovascular:     Rate and Rhythm: Normal rate and regular rhythm.  Pulmonary:     Effort: Pulmonary  effort is normal.     Breath sounds: Normal breath sounds. No wheezing or rales.  Neurological:     Mental Status: He is alert.    Lab Results  Component Value Date   WBC 10.5 08/07/2021   HGB 14.7 08/07/2021   HCT 44.3 08/07/2021   PLT 232 08/07/2021   GLUCOSE 121 (H) 08/07/2021   CHOL 98 (L) 08/07/2021   TRIG 63 08/07/2021   HDL 38 (L) 08/07/2021   LDLCALC 46 08/07/2021   ALT 26 08/07/2021   AST 24 08/07/2021   NA 133 (L) 08/07/2021   K 4.6 08/07/2021   CL 99 08/07/2021   CREATININE 1.13 08/07/2021   BUN 10 08/07/2021   CO2 18 (L) 08/07/2021   TSH 0.969 07/17/2015   PSA 0.71 07/03/2014   INR 1.11 07/18/2015   HGBA1C 6.7 (H) 08/07/2021     Assessment & Plan:   Problem List Items Addressed This Visit       Respiratory   Rhinosinusitis    Treating with doxycycline.       Relevant Medications   doxycycline (VIBRA-TABS) 100 MG tablet    Meds ordered this encounter  Medications   doxycycline (VIBRA-TABS) 100 MG tablet    Sig: Take 1 tablet (100 mg total) by mouth 2 (two) times daily.    Dispense:  14 tablet    Refill:  0    Follow-up:  Return if symptoms worsen or fail to improve.  [place]

## 2022-03-17 NOTE — Assessment & Plan Note (Signed)
Treating with doxycycline. 

## 2022-03-17 NOTE — Patient Instructions (Signed)
Antibiotic twice daily as prescribed.  Take it with food. Avoid a lot of direct sunlight (this medicine makes you more likely to be photosensitive).  Take care  Dr. Lacinda Axon

## 2022-03-22 ENCOUNTER — Other Ambulatory Visit: Payer: Self-pay | Admitting: Family Medicine

## 2022-03-22 DIAGNOSIS — R0609 Other forms of dyspnea: Secondary | ICD-10-CM

## 2022-03-23 ENCOUNTER — Other Ambulatory Visit: Payer: Self-pay | Admitting: Family Medicine

## 2022-04-01 ENCOUNTER — Other Ambulatory Visit: Payer: Self-pay | Admitting: Family Medicine

## 2022-04-08 ENCOUNTER — Other Ambulatory Visit: Payer: Self-pay | Admitting: Family Medicine

## 2022-04-29 ENCOUNTER — Other Ambulatory Visit: Payer: Self-pay | Admitting: Family Medicine

## 2022-04-29 DIAGNOSIS — Z9109 Other allergy status, other than to drugs and biological substances: Secondary | ICD-10-CM

## 2022-05-04 ENCOUNTER — Other Ambulatory Visit: Payer: Self-pay

## 2022-05-04 DIAGNOSIS — R0609 Other forms of dyspnea: Secondary | ICD-10-CM

## 2022-05-04 MED ORDER — ALBUTEROL SULFATE HFA 108 (90 BASE) MCG/ACT IN AERS: 2.0000 | INHALATION_SPRAY | Freq: Four times a day (QID) | RESPIRATORY_TRACT | 1 refills | Status: DC | PRN

## 2022-05-27 ENCOUNTER — Other Ambulatory Visit: Payer: Self-pay | Admitting: *Deleted

## 2022-05-27 DIAGNOSIS — I10 Essential (primary) hypertension: Secondary | ICD-10-CM

## 2022-05-27 MED ORDER — HYDROCHLOROTHIAZIDE 25 MG PO TABS: ORAL_TABLET | ORAL | 0 refills | Status: DC

## 2022-05-27 MED ORDER — METOPROLOL SUCCINATE ER 50 MG PO TB24: ORAL_TABLET | ORAL | 0 refills | Status: DC

## 2022-05-29 ENCOUNTER — Other Ambulatory Visit: Payer: Self-pay | Admitting: Family Medicine

## 2022-05-29 DIAGNOSIS — I10 Essential (primary) hypertension: Secondary | ICD-10-CM

## 2022-06-16 ENCOUNTER — Telehealth: Payer: Self-pay | Admitting: Interventional Cardiology

## 2022-06-16 ENCOUNTER — Telehealth: Payer: Self-pay

## 2022-06-16 NOTE — Telephone Encounter (Signed)
Preoperative team, please contact patient and let him know that cataract surgeries are low risk and typically do not require holding blood thinning medications.  We would recommend that his Plavix be continued through his surgery.  Thank you.  Jossie Ng. Tiaunna Buford NP-C     06/16/2022, 2:32 PM [address] Office [phone] Fax [phone]

## 2022-06-16 NOTE — Telephone Encounter (Signed)
   Pre-operative Risk Assessment    Patient Name: LEOCADIO HEAL  DOB: 1946-08-29 MRN: [id number]      Request for Surgical Clearance    Procedure:   CATARACT EXTRACTION BY PE, IOL -LEFT WITH GONIOTOMY   Date of Surgery:  Clearance 06/22/22                                 Surgeon:  DR. Adela Ports Surgeon's Group or Practice Name:  [facility] Phone number:  [phone] Fax number:  [phone]   Type of Clearance Requested:   - Pharmacy:  Hold Clopidogrel (Plavix) 5 DAYS BEFORE SURGERY   Type of Anesthesia:   IV SEDATION    Additional requests/questions:    Signed, Jacinta Shoe   06/16/2022, 11:12 AM

## 2022-06-16 NOTE — Telephone Encounter (Signed)
   Patient Name: Mitchell Oliver  DOB: 09-19-1946 MRN: [id number]  Primary Cardiologist: Sinclair Grooms, MD  Chart reviewed as part of pre-operative protocol coverage. Cataract extractions are recognized in guidelines as low risk surgeries that do not typically require specific preoperative testing or holding of blood thinner therapy. Therefore, given past medical history and time since last visit, based on ACC/AHA guidelines, [place] would be at acceptable risk for the planned procedure without further cardiovascular testing.   I will route this recommendation to the requesting party via Epic fax function and remove from pre-op pool.  Please call with questions.  Deberah Pelton, NP 06/16/2022, 11:53 AM

## 2022-06-16 NOTE — Telephone Encounter (Signed)
Spoke with Caryl Pina, NP and made her aware that we have received the clearance and entered it into our system. I informed her that someone will let them what recommendations they have for holding Plavix. Caryl Pina that me for the call.

## 2022-06-16 NOTE — Telephone Encounter (Signed)
Calling back to confirm that the clopidogrel (PLAVIX) 75 MG tablet can be withheld from patient. Please advise

## 2022-06-16 NOTE — Telephone Encounter (Signed)
Spoke with Caryl Pina, NP and states that because the patient is having cataract surgery along with glaucoma surgery that the surgeon request patient hold his blood thinner for 5 days before. Please advise

## 2022-06-16 NOTE — Telephone Encounter (Signed)
Spoke with patient who is agreeable to Ambrose Pancoast, NP on 8/25 @ 8:30 am. Patient thanked me for the call.  I will route to requesting surgeon's office.

## 2022-06-16 NOTE — Telephone Encounter (Signed)
Ashely, NP with [facility] called to check on status of pre op clearance that was faxed yesterday.  She called wanting to know about patient's Plavix.

## 2022-06-16 NOTE — Telephone Encounter (Signed)
    Primary Cardiologist:Henry Nicholes Stairs III, MD  Chart reviewed as part of pre-operative protocol coverage. Because of Jude C Medley's past medical history and time since last visit, he/she will require a follow-up visit in order to better assess preoperative cardiovascular risk.  Pre-op covering staff: - Please schedule appointment and call patient to inform them. - Please contact requesting surgeon's office via preferred method (i.e, phone, fax) to inform them of need for appointment prior to surgery.  If applicable, this message will also be routed to pharmacy pool and/or primary cardiologist for input on holding anticoagulant/antiplatelet agent as requested below so that this information is available at time of patient's appointment.   Deberah Pelton, NP  06/16/2022, 3:29 PM

## 2022-06-17 ENCOUNTER — Other Ambulatory Visit: Payer: Self-pay | Admitting: Family Medicine

## 2022-06-17 DIAGNOSIS — R0609 Other forms of dyspnea: Secondary | ICD-10-CM

## 2022-06-17 NOTE — Progress Notes (Signed)
Office Visit    Patient Name: Mitchell Oliver Date of Encounter: 06/19/2022  Primary Care Provider:  Coral Spikes, DO Primary Cardiologist:  Sinclair Grooms, MD Primary Electrophysiologist: None  Chief Complaint    Mitchell Oliver is a 76 y.o. male with PMH of CAD s/p NSTEMI 2016 with DES to the PDA and staged PCI to diagonal, CVA (2016), GERD, HTN, HLD, emphysema who presents today for surgical clearance.  Past Medical History    Past Medical History:  Diagnosis Date   Allergy    COPD (chronic obstructive pulmonary disease) ([place])    CVA (cerebral vascular accident) ([place]) 2012   denies residual on 07/22/2015   ED (erectile dysfunction)    Family history of adverse reaction to anesthesia    "think my mother had real bad headaches after anesthesia"   GERD (gastroesophageal reflux disease)    Hypercholesterolemia    Hypertension    IFG (impaired fasting glucose)    NSTEMI (non-ST elevated myocardial infarction) ([place])    Pneumonia ~ 2005   Past Surgical History:  Procedure Laterality Date   APPENDECTOMY  2009 [facility]   BACK SURGERY     BIOPSY  05/04/2014   Procedure: BIOPSY;  Surgeon: Danie Binder, MD;  [facility];  Service: Endoscopy;;   CARDIAC CATHETERIZATION N/A 07/18/2015   Procedure: Left Heart Cath and Coronary Angiography;  Surgeon: Belva Crome, MD;  Location: [place] [facility];  Service: Cardiovascular;  Laterality: N/A;   CARDIAC CATHETERIZATION N/A 07/18/2015   Procedure: Coronary Stent Intervention;  Surgeon: Belva Crome, MD;  Location: [place] [facility];  Service: Cardiovascular;  Laterality: N/A;   CARDIAC CATHETERIZATION N/A 07/22/2015   Procedure: Coronary Stent Intervention;  Surgeon: Belva Crome, MD;  Location: [place] [facility];  Service: Cardiovascular;  Laterality: N/A;   COLONOSCOPY  2009   COLONOSCOPY N/A 08/04/2019   Procedure: COLONOSCOPY;  Surgeon: Danie Binder, MD;  [facility];  Service: Endoscopy;   Laterality: N/A;  12:30   ESOPHAGOGASTRODUODENOSCOPY N/A 05/04/2014   Procedure: ESOPHAGOGASTRODUODENOSCOPY (EGD);  Surgeon: Danie Binder, MD;  [facility];  Service: Endoscopy;  Laterality: N/A;  9:15   POLYPECTOMY  08/04/2019   Procedure: POLYPECTOMY;  Surgeon: Danie Binder, MD;  [facility];  Service: Endoscopy;;  hepatic flexure, ascending colon, transverse colon, splenic flexure, descending colon    POSTERIOR LAMINECTOMY / DECOMPRESSION LUMBAR SPINE  06/03/2015   SAVORY DILATION N/A 05/04/2014   Procedure: SAVORY DILATION;  Surgeon: Danie Binder, MD;  [facility];  Service: Endoscopy;  Laterality: N/A;    Allergies  No Known Allergies  History of Present Illness    Mitchell Oliver is a 76 year old gentleman with the above-mentioned past medical history who presents today for surgical clearance of cataract surgery.  Mr. Levee was originally seen in 06/2015 at [facility] where he presented with chest pain and left shoulder pain.  ACS work-up revealed NSTEMI He underwent a Myoview nuclear stress test to risk stratify. The study was abnormal, suggesting anterior ischemia. Subsequently, he underwent a LHC on 07/18/15. This demonstrated normal LVF and a high-grade obstruction in the PDA and first diagonal. He underwent successful PCI with drug-eluting stent implantation in the proximal PDA.  Patient still had a high-grade obstruction and moderate to large branching diagonal.  Staged PCI was planned and completed by Dr. Tamala Julian on 07/22/2015 with a residual 50% narrowing in  the Dx. The pt also had 60% CFX, 40% RCA, and 65% OM1 residual CAD. He was placed on DAPT with ASA + Brilinta, high dose statin therapy and metoprolol.  Patient was seen last on 04/2021 by Dr. Tamala Julian for preoperative clearance of excision of melanoma.  Patient was doing well during follow-up and was compliant with preventative therapies and blood pressure medications.  During visit patient denied  angina and Dr. Tamala Julian considered SGLT2 for protective effect.  Since last being seen in the office patient reports he has been doing well with no new cardiac complaints.  He is compliant with his medications and blood pressure today was 96/50.  He states that he has noticed reduction in blood pressures since losing weight.  He is staying active and walking 2 and half miles every day and continues to work full-time 6 days a week.  Patient denies chest pain, palpitations, dyspnea, PND, orthopnea, nausea, vomiting, dizziness, syncope, edema, weight gain, or early satiety.   Home Medications    Current Outpatient Medications  Medication Sig Dispense Refill   albuterol (VENTOLIN HFA) 108 (90 Base) MCG/ACT inhaler INHALE 2 PUFFS INTO THE LUNGS EVERY 6 HOURS AS NEEDED FOR WHEEZING OR SHORTNESS OF BREATH 8.5 g 1   Ascorbic Acid (VITAMIN C PO) Take 4 tablets by mouth daily. Takes 4000 mg daily     atorvastatin (LIPITOR) 80 MG tablet TAKE 1 TABLET BY MOUTH DAILY 90 tablet 1   benazepril (LOTENSIN) 20 MG tablet TAKE 1 TABLET(20 MG) BY MOUTH DAILY 90 tablet 1   Cholecalciferol 25 MCG (1000 UT) tablet Take by mouth.     clopidogrel (PLAVIX) 75 MG tablet TAKE 1 TABLET(75 MG) BY MOUTH DAILY 90 tablet 1   Cyanocobalamin (VITAMIN B-12 CR) 1000 MCG TBCR Take 1 tablet by mouth daily.     dorzolamide-timolol (COSOPT) 22.3-6.8 MG/ML ophthalmic solution SMARTSIG:1 Drop(s) In Eye(s) Every 12 Hours     fish oil-omega-3 fatty acids 1000 MG capsule Take 2 g by mouth daily.     fluticasone (FLONASE) 50 MCG/ACT nasal spray SHAKE LIQUID AND USE 1 SPRAY IN EACH NOSTRIL DAILY AS NEEDED FOR ALLERGIES OR RHINITIS 16 g 2   JARDIANCE 10 MG TABS tablet TAKE 1 TABLET(10 MG) BY MOUTH DAILY 90 tablet 1   latanoprost (XALATAN) 0.005 % ophthalmic solution Apply to eye.     metoprolol succinate (TOPROL-XL) 50 MG 24 hr tablet Take with or immediately following a meal. 30 tablet 0   nitroGLYCERIN (NITROSTAT) 0.4 MG SL tablet Place 1  tablet (0.4 mg total) under the tongue every 5 (five) minutes as needed for chest pain (maxium 3 tablets daily). Do not take within 36 hrs of Cialis use 25 tablet 2   pantoprazole (PROTONIX) 40 MG tablet TAKE 1 TABLET(40 MG) BY MOUTH EVERY MORNING 90 tablet 1   potassium chloride SA (KLOR-CON) 20 MEQ tablet TAKE 1 TABLET BY MOUTH DAILY 90 tablet 0   Zinc Acetate 50 MG CAPS Take 50 mg by mouth daily.      amLODipine (NORVASC) 2.5 MG tablet Take 1 tablet (2.5 mg total) by mouth daily. 90 tablet 3   hydrochlorothiazide (HYDRODIURIL) 25 MG tablet Take 0.5 tablets (12.5 mg total) by mouth daily. 90 tablet 2   promethazine-dextromethorphan (PROMETHAZINE-DM) 6.25-15 MG/5ML syrup Take 5 mLs by mouth 4 (four) times daily as needed for cough. 118 mL 0   No current facility-administered medications for this visit.     Review of Systems  Please see the history of  present illness.    (+) Right knee pain  All other systems reviewed and are otherwise negative except as noted above.  Physical Exam    Wt Readings from Last 3 Encounters:  06/19/22 240 lb (108.9 kg)  03/17/22 236 lb (107 kg)  12/12/21 238 lb 6.4 oz (108.1 kg)   VS: Vitals:   06/19/22 0822  BP: (!) 96/50  Pulse: 62  SpO2: 96%  ,Body mass index is 30.81 kg/m.  Constitutional:      Appearance: Healthy appearance. Not in distress.  Neck:     Vascular: JVD normal.  Pulmonary:     Effort: Pulmonary effort is normal.     Breath sounds: No wheezing. No rales. Diminished in the bases Cardiovascular:     Normal rate. Regular rhythm. Normal S1. Normal S2.      Murmurs: There is no murmur.  Edema:    Peripheral edema absent.  Abdominal:     Palpations: Abdomen is soft non tender. There is no hepatomegaly.  Skin:    General: Skin is warm and dry.  Neurological:     General: No focal deficit present.     Mental Status: Alert and oriented to person, place and time.     Cranial Nerves: Cranial nerves are intact.  EKG/LABS/Other  Studies Reviewed    ECG personally reviewed by me today -sinus rhythm with PACs heart rate of 62 bpm with no acute changes since previous EKG    Lab Results  Component Value Date   WBC 10.5 08/07/2021   HGB 14.7 08/07/2021   HCT 44.3 08/07/2021   MCV 89 08/07/2021   PLT 232 08/07/2021   Lab Results  Component Value Date   CREATININE 1.13 08/07/2021   BUN 10 08/07/2021   NA 133 (L) 08/07/2021   K 4.6 08/07/2021   CL 99 08/07/2021   CO2 18 (L) 08/07/2021   Lab Results  Component Value Date   ALT 26 08/07/2021   AST 24 08/07/2021   ALKPHOS 71 08/07/2021   BILITOT 1.3 (H) 08/07/2021   Lab Results  Component Value Date   CHOL 98 (L) 08/07/2021   HDL 38 (L) 08/07/2021   LDLCALC 46 08/07/2021   TRIG 63 08/07/2021   CHOLHDL 2.6 08/07/2021    Lab Results  Component Value Date   HGBA1C 6.7 (H) 08/07/2021    Assessment & Plan    1.  Preoperative clearance  -{The patient affirms he has been doing well without any new cardiac symptoms. They are able to achieve 6 METS without cardiac limitations. Therefore, based on ACC/AHA guidelines, the patient would be at acceptable risk for the planned procedure without further cardiovascular testing. The patient was advised that if he develops new symptoms prior to surgery to contact our office to arrange for a follow-up visit, and he verbalized understanding.   Mr. Fidalgo perioperative risk of a major cardiac event is 6.6% according to the Revised Cardiac Risk Index (RCRI).  Therefore, he is at high risk for perioperative complications.   His functional capacity is good at 5.29 METs according to the [facility] Activity Status Index (DASI).  Recommendations: According to ACC/AHA guidelines, no further cardiovascular testing needed.  The patient may proceed to surgery at acceptable risk.   Antiplatelet and/or Anticoagulation Recommendations: Clopidogrel (Plavix) should not be held before surgery and recommend that Plavix be continued through  his surgery   2.  Coronary artery disease: -s/p NSTEMI 2016 with DES to the PDA and staged PCI to diagonal,  CVA (2016 -Continue GDMT with atorvastatin 80 mg, Plavix 75 mg, Toprol 50 mg -Patient reports today that he is no complaints of angina  3.  Dyslipidemia: -Patient's last LDL was 46 at goal of less than 70 -Continue atorvastatin as noted above  4.  Essential hypertension: -Patient's blood pressure today was 96/50 -He notes that his pressures have been decreased since losing over 50 pounds that was intentional over the past year. -We will decrease his HCTZ to 12.5 mg daily and reduce amlodipine to 5 mg daily -Patient was instructed to monitor blood pressures over the next 4 weeks and notify office if blood pressures increase with systolic greater than 916 or diastolic greater than 90   Disposition: Follow-up with Belva Crome III, MD or [facility] in 12 months    Medication Adjustments/Labs and Tests Ordered: Current medicines are reviewed at length with the patient today.  Concerns regarding medicines are outlined above.   Signed, Mable Fill, Marissa Nestle, NP 06/19/2022, 10:20 AM [place]

## 2022-06-19 ENCOUNTER — Ambulatory Visit: Payer: Medicare Other | Admitting: Nurse Practitioner

## 2022-06-19 ENCOUNTER — Encounter: Payer: Self-pay | Admitting: Nurse Practitioner

## 2022-06-19 VITALS — BP 96/50 | HR 62 | Ht 74.0 in | Wt 240.0 lb

## 2022-06-19 DIAGNOSIS — Z9861 Coronary angioplasty status: Secondary | ICD-10-CM

## 2022-06-19 DIAGNOSIS — I2510 Atherosclerotic heart disease of native coronary artery without angina pectoris: Secondary | ICD-10-CM

## 2022-06-19 DIAGNOSIS — E785 Hyperlipidemia, unspecified: Secondary | ICD-10-CM | POA: Diagnosis not present

## 2022-06-19 DIAGNOSIS — I10 Essential (primary) hypertension: Secondary | ICD-10-CM

## 2022-06-19 DIAGNOSIS — Z01810 Encounter for preprocedural cardiovascular examination: Secondary | ICD-10-CM

## 2022-06-19 MED ORDER — AMLODIPINE BESYLATE 2.5 MG PO TABS: 2.5000 mg | ORAL_TABLET | Freq: Every day | ORAL | 3 refills | Status: DC

## 2022-06-19 MED ORDER — HYDROCHLOROTHIAZIDE 25 MG PO TABS: 12.5000 mg | ORAL_TABLET | Freq: Every day | ORAL | 2 refills | Status: DC

## 2022-06-19 NOTE — Patient Instructions (Addendum)
Medication Instructions:  DECREASE Norvasc to 2.'5mg'$  Take 1 tablet once a day  DECREASE Hydrochlorothiazide to 12.'5mg'$  take half of '25mg'$  tablet daily  *If you need a refill on your cardiac medications before your next appointment, please call your pharmacy*   Lab Work: None Ordered   Testing/Procedures: None ordered   Follow-Up: At [facility], you and your health needs are our priority.  As part of our continuing mission to provide you with exceptional heart care, we have created designated Provider Care Teams.  These Care Teams include your primary Cardiologist (physician) and Advanced Practice Providers (APPs -  Physician Assistants and Nurse Practitioners) who all work together to provide you with the care you need, when you need it.  We recommend signing up for the patient portal called "MyChart".  Sign up information is provided on this After Visit Summary.  MyChart is used to connect with patients for Virtual Visits (Telemedicine).  Patients are able to view lab/test results, encounter notes, upcoming appointments, etc.  Non-urgent messages can be sent to your provider as well.   To learn more about what you can do with MyChart, go to [email or web address].    Your next appointment:   1 year(s)  The format for your next appointment:   In Person  Provider:   Ambrose Pancoast, NP         Other Instructions [place].  Important Information About Sugar

## 2022-07-22 ENCOUNTER — Telehealth: Payer: Self-pay | Admitting: Interventional Cardiology

## 2022-07-22 NOTE — Telephone Encounter (Signed)
Kinsley from [place] states there are some changes in pt's surgery (hr issue) and she will be faxing over another clearance, she ask that she get a call back to discuss.   New surgery date in 07/27/22

## 2022-07-23 ENCOUNTER — Encounter: Payer: Self-pay | Admitting: Nurse Practitioner

## 2022-07-23 ENCOUNTER — Ambulatory Visit: Payer: Medicare Other | Attending: Nurse Practitioner | Admitting: Nurse Practitioner

## 2022-07-23 VITALS — BP 102/54 | HR 54 | Ht 74.0 in | Wt 238.0 lb

## 2022-07-23 DIAGNOSIS — I10 Essential (primary) hypertension: Secondary | ICD-10-CM

## 2022-07-23 DIAGNOSIS — Z9861 Coronary angioplasty status: Secondary | ICD-10-CM

## 2022-07-23 DIAGNOSIS — E785 Hyperlipidemia, unspecified: Secondary | ICD-10-CM

## 2022-07-23 DIAGNOSIS — I2510 Atherosclerotic heart disease of native coronary artery without angina pectoris: Secondary | ICD-10-CM

## 2022-07-23 DIAGNOSIS — Z01810 Encounter for preprocedural cardiovascular examination: Secondary | ICD-10-CM

## 2022-07-23 MED ORDER — METOPROLOL SUCCINATE ER 25 MG PO TB24: 25.0000 mg | ORAL_TABLET | Freq: Every day | ORAL | 3 refills | Status: DC

## 2022-07-23 NOTE — Patient Instructions (Signed)
Medication Instructions:  Your physician has recommended you make the following change in your medication:  DECREASE: metoprolol succinate (Toprol-XL) to 25 mg by mouth once daily STOP: hydrochlorothiazide (HCTZ)   *If you need a refill on your cardiac medications before your next appointment, please call your pharmacy*   Lab Work: NONE If you have labs (blood work) drawn today and your tests are completely normal, you will receive your results only by: [place] (if you have MyChart) OR A paper copy in the mail If you have any lab test that is abnormal or we need to change your treatment, we will call you to review the results.   Testing/Procedures: NONE   Follow-Up: At [facility] you and your health needs are our priority.  As part of our continuing mission to provide you with exceptional heart care, we have created designated Provider Care Teams.  These Care Teams include your primary Cardiologist (physician) and Advanced Practice Providers (APPs -  Physician Assistants and Nurse Practitioners) who all work together to provide you with the care you need, when you need it.   Your next appointment:   6 month(s)  The format for your next appointment:   In Person  Provider:   Ambrose Pancoast, NP        Important Information About Sugar

## 2022-07-23 NOTE — Progress Notes (Signed)
Office Visit    Patient Name: Mitchell Oliver Date of Encounter: 07/23/2022  Primary Care Provider:  Coral Spikes, DO Primary Cardiologist:  Sinclair Grooms, MD Primary Electrophysiologist: None  Chief Complaint    Mitchell Oliver is a 76 y.o. male with PMH of CAD s/p NSTEMI 2016 with DES to the PDA and staged PCI to diagonal, CVA (2016), GERD, HTN, HLD, emphysema who presents today for surgical clearance.  Past Medical History    Past Medical History:  Diagnosis Date   Allergy    COPD (chronic obstructive pulmonary disease) ([place])    CVA (cerebral vascular accident) ([place]) 2012   denies residual on 07/22/2015   ED (erectile dysfunction)    Family history of adverse reaction to anesthesia    "think my mother had real bad headaches after anesthesia"   GERD (gastroesophageal reflux disease)    Hypercholesterolemia    Hypertension    IFG (impaired fasting glucose)    NSTEMI (non-ST elevated myocardial infarction) ([place])    Pneumonia ~ 2005   Past Surgical History:  Procedure Laterality Date   APPENDECTOMY  2009 [facility]   BACK SURGERY     BIOPSY  05/04/2014   Procedure: BIOPSY;  Surgeon: Danie Binder, MD;  [facility];  Service: Endoscopy;;   CARDIAC CATHETERIZATION N/A 07/18/2015   Procedure: Left Heart Cath and Coronary Angiography;  Surgeon: Belva Crome, MD;  Location: [place] [facility];  Service: Cardiovascular;  Laterality: N/A;   CARDIAC CATHETERIZATION N/A 07/18/2015   Procedure: Coronary Stent Intervention;  Surgeon: Belva Crome, MD;  Location: [place] [facility];  Service: Cardiovascular;  Laterality: N/A;   CARDIAC CATHETERIZATION N/A 07/22/2015   Procedure: Coronary Stent Intervention;  Surgeon: Belva Crome, MD;  Location: [place] [facility];  Service: Cardiovascular;  Laterality: N/A;   COLONOSCOPY  2009   COLONOSCOPY N/A 08/04/2019   Procedure: COLONOSCOPY;  Surgeon: Danie Binder, MD;  [facility];  Service: Endoscopy;   Laterality: N/A;  12:30   ESOPHAGOGASTRODUODENOSCOPY N/A 05/04/2014   Procedure: ESOPHAGOGASTRODUODENOSCOPY (EGD);  Surgeon: Danie Binder, MD;  [facility];  Service: Endoscopy;  Laterality: N/A;  9:15   POLYPECTOMY  08/04/2019   Procedure: POLYPECTOMY;  Surgeon: Danie Binder, MD;  [facility];  Service: Endoscopy;;  hepatic flexure, ascending colon, transverse colon, splenic flexure, descending colon    POSTERIOR LAMINECTOMY / DECOMPRESSION LUMBAR SPINE  06/03/2015   SAVORY DILATION N/A 05/04/2014   Procedure: SAVORY DILATION;  Surgeon: Danie Binder, MD;  [facility];  Service: Endoscopy;  Laterality: N/A;    Allergies  No Known Allergies  History of Present Illness    Mitchell Oliver is a 76 year old gentleman with the above-mentioned past medical history who presents today for surgical clearance of cataract surgery. Mitchell Oliver was originally seen in 06/2015 at [facility] where he presented with chest pain and left shoulder pain. ACS work-up revealed NSTEMI He underwent a Myoview nuclear stress test to risk stratify. The study was abnormal, suggesting anterior ischemia. Subsequently, he underwent a LHC on 07/18/15. This demonstrated normal LVF and a high-grade obstruction in the PDA and first diagonal. He underwent successful PCI with drug-eluting stent implantation in the proximal PDA.  Patient still had a high-grade obstruction and moderate to large branching diagonal.  Staged PCI was planned and completed by Dr. Tamala Julian on 07/22/2015 with a residual 50% narrowing in the Dx.  The pt also had 60% CFX, 40% RCA, and 65% OM1 residual CAD. He was placed on DAPT with ASA + Brilinta, high dose statin therapy and metoprolol. Patient was seen last on 04/2021 by Dr. Tamala Julian for preoperative clearance of excision of melanoma.  Patient was doing well during follow-up and was compliant with preventative therapies and blood pressure medications.  During visit patient denied  angina and Dr. Tamala Julian considered SGLT2 for  renal protective effect.   Patient was seen for preoperative visit in August 2023.  During visit patient's blood pressures were 96/50 patient states his pressures have decreased since losing weight.  During visit I decreased his HCTZ to 12.5 mg and reduce amlodipine to 5 mg.  Patient was given instructions to monitor blood pressures and contact us for elevated blood pressures.  He presents today for follow-up of surgical clearance due to irregular heartbeat that was seen during preop visit yesterday at [facility].  Mitchell Oliver was seen today for return preoperative clearance alone.  Since last being seen in the office patient reports he has been doing fine with no cardiac complaints.  He was recently seen for preop visit for upcoming surgery.  His heart rate during visit was noticed to be in the 40s-50s on monitor.  Patient returns today for reassurance and reevaluation prior to upcoming procedure.  During today's visit patient reported no complaints of dizziness, shortness of breath, chest discomfort.  Patient denies chest pain, palpitations, dyspnea, PND, orthopnea, nausea, vomiting, dizziness, syncope, edema, weight gain, or early satiety.  Home Medications    Current Outpatient Medications  Medication Sig Dispense Refill   albuterol (VENTOLIN HFA) 108 (90 Base) MCG/ACT inhaler INHALE 2 PUFFS INTO THE LUNGS EVERY 6 HOURS AS NEEDED FOR WHEEZING OR SHORTNESS OF BREATH 8.5 g 1   amLODipine (NORVASC) 2.5 MG tablet Take 1 tablet (2.5 mg total) by mouth daily. 90 tablet 3   Ascorbic Acid (VITAMIN C PO) Take 4 tablets by mouth daily. Takes 4000 mg daily     atorvastatin (LIPITOR) 80 MG tablet TAKE 1 TABLET BY MOUTH DAILY 90 tablet 1   benazepril (LOTENSIN) 20 MG tablet TAKE 1 TABLET(20 MG) BY MOUTH DAILY 90 tablet 1   Cholecalciferol 25 MCG (1000 UT) tablet Take by mouth.     clopidogrel (PLAVIX) 75 MG tablet TAKE 1 TABLET(75 MG) BY MOUTH DAILY 90  tablet 1   Cyanocobalamin (VITAMIN B-12 CR) 1000 MCG TBCR Take 1 tablet by mouth daily.     dorzolamide-timolol (COSOPT) 22.3-6.8 MG/ML ophthalmic solution SMARTSIG:1 Drop(s) In Eye(s) Every 12 Hours     fish oil-omega-3 fatty acids 1000 MG capsule Take 2 g by mouth daily.     fluticasone (FLONASE) 50 MCG/ACT nasal spray SHAKE LIQUID AND USE 1 SPRAY IN EACH NOSTRIL DAILY AS NEEDED FOR ALLERGIES OR RHINITIS 16 g 2   hydrochlorothiazide (HYDRODIURIL) 25 MG tablet Take 0.5 tablets (12.5 mg total) by mouth daily. 90 tablet 2   JARDIANCE 10 MG TABS tablet TAKE 1 TABLET(10 MG) BY MOUTH DAILY 90 tablet 1   latanoprost (XALATAN) 0.005 % ophthalmic solution Apply to eye.     metoprolol succinate (TOPROL-XL) 50 MG 24 hr tablet Take with or immediately following a meal. 30 tablet 0   nitroGLYCERIN (NITROSTAT) 0.4 MG SL tablet Place 1 tablet (0.4 mg total) under the tongue every 5 (five) minutes as needed for chest pain (maxium 3 tablets daily). Do not take within 36 hrs of Cialis use 25 tablet 2  pantoprazole (PROTONIX) 40 MG tablet TAKE 1 TABLET(40 MG) BY MOUTH EVERY MORNING 90 tablet 1   potassium chloride SA (KLOR-CON) 20 MEQ tablet TAKE 1 TABLET BY MOUTH DAILY 90 tablet 0   promethazine-dextromethorphan (PROMETHAZINE-DM) 6.25-15 MG/5ML syrup Take 5 mLs by mouth 4 (four) times daily as needed for cough. 118 mL 0   Zinc Acetate 50 MG CAPS Take 50 mg by mouth daily.      No current facility-administered medications for this visit.     Review of Systems  Please see the history of present illness.     All other systems reviewed and are otherwise negative except as noted above.  Physical Exam    Wt Readings from Last 3 Encounters:  06/19/22 240 lb (108.9 kg)  03/17/22 236 lb (107 kg)  12/12/21 238 lb 6.4 oz (108.1 kg)   GY:KZLDJ were no vitals filed for this visit.,There is no height or weight on file to calculate BMI.  Constitutional:      Appearance: Healthy appearance. Not in distress.   Neck:     Vascular: JVD normal.  Pulmonary:     Effort: Pulmonary effort is normal.     Breath sounds: No wheezing. No rales. Diminished in the bases Cardiovascular:     Normal rate. Regular rhythm. Normal S1. Normal S2.      Murmurs: There is no murmur.  Edema:    Peripheral edema absent.  Abdominal:     Palpations: Abdomen is soft non tender. There is no hepatomegaly.  Skin:    General: Skin is warm and dry.  Neurological:     General: No focal deficit present.     Mental Status: Alert and oriented to person, place and time.     Cranial Nerves: Cranial nerves are intact.  EKG/LABS/Other Studies Reviewed    ECG personally reviewed by me today -sinus bradycardia with sinus arrhythmia and rate of 54 bpm with normal axis and no acute changes.  Lab Results  Component Value Date   WBC 10.5 08/07/2021   HGB 14.7 08/07/2021   HCT 44.3 08/07/2021   MCV 89 08/07/2021   PLT 232 08/07/2021   Lab Results  Component Value Date   CREATININE 1.13 08/07/2021   BUN 10 08/07/2021   NA 133 (L) 08/07/2021   K 4.6 08/07/2021   CL 99 08/07/2021   CO2 18 (L) 08/07/2021   Lab Results  Component Value Date   ALT 26 08/07/2021   AST 24 08/07/2021   ALKPHOS 71 08/07/2021   BILITOT 1.3 (H) 08/07/2021   Lab Results  Component Value Date   CHOL 98 (L) 08/07/2021   HDL 38 (L) 08/07/2021   LDLCALC 46 08/07/2021   TRIG 63 08/07/2021   CHOLHDL 2.6 08/07/2021    Lab Results  Component Value Date   HGBA1C 6.7 (H) 08/07/2021    Assessment & Plan    1.  Preoperative clearance   {The patient affirms he has been doing well without any new cardiac symptoms. They are able to achieve 6 METS without cardiac limitations. Therefore, based on ACC/AHA guidelines, the patient would be at acceptable risk for the planned procedure without further cardiovascular testing. The patient was advised that if he develops new symptoms prior to surgery to contact our office to arrange for a follow-up visit, and  he verbalized understanding.    Mr. Tawil perioperative risk of a major cardiac event is 6.6% according to the Revised Cardiac Risk Index (RCRI). Therefore, he is at high  risk for perioperative complications. His functional capacity is good at 5.29 METs according to the [facility] Activity Status Index (DASI).   Recommendations: According to ACC/AHA guidelines, no further cardiovascular testing needed.  The patient may proceed to surgery at acceptable risk.   Antiplatelet and/or Anticoagulation Recommendations: Clopidogrel (Plavix) should not be held before surgery and recommend that Plavix be continued through his surgery    2.  Coronary artery disease: -s/p NSTEMI 2016 with DES to the PDA and staged PCI to diagonal, CVA (2016 -Continue GDMT with atorvastatin 80 mg, Plavix 75 mg, Toprol 50 mg -Patient reports today that he is no complaints of angina   3.  Dyslipidemia: -Patient's last LDL was 46 at goal of less than 70 -Continue atorvastatin as noted above   4.  Bradycardia/Essential hypertension: -We will discontinue HCTZ today and decrease patient's Toprol-XL to 25 mg daily -Patient's blood pressure today was 102/54     Disposition: Follow-up with Belva Crome III, MD or APP in 6 months    Medication Adjustments/Labs and Tests Ordered: Current medicines are reviewed at length with the patient today.  Concerns regarding medicines are outlined above.   Signed, Mable Fill, Marissa Nestle, NP 07/23/2022, 9:07 AM [facility]  Note:  This document was prepared using Dragon voice recognition software and may include unintentional dictation errors.

## 2022-07-23 NOTE — Telephone Encounter (Signed)
[place] and spoke with [place], [place]. She states pt had an in office visit yesterday with them and showed irregular bradycardia (45-49) bpm. She also thought the pt should have another in office visit. Pt is scheduled for today at 2:45pm. She would also like APP to give  her a call on cell to ensure everyone is on the same page.

## 2022-07-29 ENCOUNTER — Other Ambulatory Visit: Payer: Self-pay | Admitting: Family Medicine

## 2022-07-29 DIAGNOSIS — R0609 Other forms of dyspnea: Secondary | ICD-10-CM

## 2022-08-11 ENCOUNTER — Other Ambulatory Visit: Payer: Self-pay | Admitting: Family Medicine

## 2022-08-19 ENCOUNTER — Ambulatory Visit (INDEPENDENT_AMBULATORY_CARE_PROVIDER_SITE_OTHER): Payer: Medicare Other | Admitting: Family Medicine

## 2022-08-19 VITALS — BP 149/80 | HR 49 | Temp 97.3°F | Ht 74.0 in | Wt 239.0 lb

## 2022-08-19 DIAGNOSIS — E785 Hyperlipidemia, unspecified: Secondary | ICD-10-CM

## 2022-08-19 DIAGNOSIS — Z23 Encounter for immunization: Secondary | ICD-10-CM

## 2022-08-19 DIAGNOSIS — E1159 Type 2 diabetes mellitus with other circulatory complications: Secondary | ICD-10-CM

## 2022-08-19 DIAGNOSIS — Z0000 Encounter for general adult medical examination without abnormal findings: Secondary | ICD-10-CM | POA: Insufficient documentation

## 2022-08-19 DIAGNOSIS — D649 Anemia, unspecified: Secondary | ICD-10-CM

## 2022-08-19 DIAGNOSIS — Z130 Encounter for screening for diseases of the blood and blood-forming organs and certain disorders involving the immune mechanism: Secondary | ICD-10-CM | POA: Diagnosis not present

## 2022-08-19 NOTE — Patient Instructions (Addendum)
Pneumonia vaccine at the pharmacy.  Labs today.  We will see what we can find out regarding the cost of the medication.  Follow up in 6 months.  Take care  Dr. Lacinda Axon

## 2022-08-19 NOTE — Assessment & Plan Note (Signed)
Labs today. Diabetic foot exam was performed.  This is located in the quality metrics section. Pneumococcal 20 was given today as well as flu vaccine. Patient is doing well.  Follow-up in 6 months.

## 2022-08-19 NOTE — Progress Notes (Signed)
Subjective:  Patient ID: Mitchell Oliver, male    DOB: 1946/09/04  Age: 76 y.o. MRN: [id number]  CC: Wellness exam/physical  HPI:  76 year old male with type 2 diabetes, hypertension, CAD status post angioplasty, COPD, history of melanoma, history of CVA, dyslipidemia presents for an annual exam.  Patient states that he is doing well.  He has no significant plaints or concerns at this time.  In regards to his preventative health care, he is overdue for several items.  Needs labs today including A1c.  Needs foot exam.  Has recently seen his eye doctor.  Has had glaucoma surgery 2 weeks ago.  Was most recently seen by his eye doctor on 10/18.  He would like a flu vaccine today.  He is also inquiring about pneumonia vaccine.  He has had 2 prior doses, pneumococcal 23 and pneumococcal 13.  Most recent pneumococcal vaccine was in 2016.  Additionally, patient reports that his Vania Rea is now quite expensive.  The price is increased and he is unsure why.  He may not be able to afford this in the future.  It is costing him over $500 for 68-monthsupply.  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 ([place] 05/06/2020   COPD [place]sema ([place] 07/23/2015   CAD S/P percutaneous coronary angioplasty 07/22/2015   Essential hypertension, benign 06/22/2013   Dyslipidemia 06/22/2013   Type 2 diabetes mellitus with other circulatory complications ([place] [id number]  Erectile dysfunction 06/22/2013   History of CVA 2012 06/22/2013    Social Hx   Social History   Socioeconomic History   Marital status: Divorced    Spouse name: Not on file   Number of children: 3   Years of education: Not on file   Highest education level: Not on file  Occupational History   Occupation: Sales  Tobacco Use   Smoking status: Former    Packs/day: 2.00    Years: 24.00    Total pack years: 48.00    Types: Cigarettes    Start date:  1963    Quit date: 12/24/1985    Years since quitting: 36.6   Smokeless tobacco: Never  Substance and Sexual Activity   Alcohol use: Yes    Alcohol/week: 6.0 standard drinks of alcohol    Types: 2 Glasses of wine, 4 Cans of beer per week   Drug use: No   Sexual activity: Yes  Other Topics Concern   Not on file  Social History Narrative   3 daughters, 2 live [place], [place]   3 grandsons   Active in grandson's travel ball. Works part time for [employer]    Social Determinants of Health   Financial Resource Strain: Low Risk  (07/22/2021)   Overall Financial Resource Strain (CARDIA)    Difficulty of Paying Living Expenses: Not hard at all  Food Insecurity: No Food Insecurity (07/22/2021)   Hunger Vital Sign    Worried About Running Out of Food in the Last Year: Never true    Ran Out of Food in the Last Year: Never true  Transportation Needs: No Transportation Needs (07/22/2021)   PRAPARE - THydrologist(Medical): No    Lack of Transportation (Non-Medical): No  Physical Activity: Sufficiently Active (07/22/2021)   Exercise Vital Sign    Days of Exercise per Week: 5 days    Minutes of Exercise  per Session: 60 min  Stress: No Stress Concern Present (07/22/2021)   [place]    Feeling of Stress : Not at all  Social Connections: Moderately Integrated (07/22/2021)   Social Connection and Isolation Panel [NHANES]    Frequency of Communication with Friends and Family: More than three times a week    Frequency of Social Gatherings with Friends and Family: More than three times a week    Attends Religious Services: More than 4 times per year    Active Member of [employer] or Organizations: Yes    Attends Music therapist: More than 4 times per year    Marital Status: Divorced    Review of Systems  Constitutional: Negative.   Respiratory: Negative.     Cardiovascular: Negative.    Objective:  BP (!) 149/80   Pulse (!) 49   Temp (!) 97.3 F (36.3 C)   Ht '6\' 2"'  (1.88 m)   Wt 239 lb (108.4 kg)   SpO2 96%   BMI 30.69 kg/m      08/19/2022    9:01 AM 07/23/2022    2:51 PM 06/19/2022    8:22 AM  BP/Weight  Systolic BP 416 606 96  Diastolic BP 80 54 50  Wt. (Lbs) 239 238 240  BMI 30.69 kg/m2 30.56 kg/m2 30.81 kg/m2    Physical Exam Vitals and nursing note reviewed.  Constitutional:      General: He is not in acute distress.    Appearance: Normal appearance. He is obese.  HENT:     Head: Normocephalic and atraumatic.  Eyes:     General:        Right eye: No discharge.        Left eye: No discharge.     Conjunctiva/sclera: Conjunctivae normal.  Cardiovascular:     Rate and Rhythm: Regular rhythm. Bradycardia present.  Pulmonary:     Effort: Pulmonary effort is normal.     Breath sounds: Normal breath sounds. No wheezing, rhonchi or rales.  Abdominal:     General: There is no distension.     Palpations: Abdomen is soft.     Tenderness: There is no abdominal tenderness.  Neurological:     General: No focal deficit present.     Mental Status: He is alert.  Psychiatric:        Mood and Affect: Mood normal.        Behavior: Behavior normal.     Lab Results  Component Value Date   WBC 10.5 08/07/2021   HGB 14.7 08/07/2021   HCT 44.3 08/07/2021   PLT 232 08/07/2021   GLUCOSE 121 (H) 08/07/2021   CHOL 98 (L) 08/07/2021   TRIG 63 08/07/2021   HDL 38 (L) 08/07/2021   LDLCALC 46 08/07/2021   ALT 26 08/07/2021   AST 24 08/07/2021   NA 133 (L) 08/07/2021   K 4.6 08/07/2021   CL 99 08/07/2021   CREATININE 1.13 08/07/2021   BUN 10 08/07/2021   CO2 18 (L) 08/07/2021   TSH 0.969 07/17/2015   PSA 0.71 07/03/2014   INR 1.11 07/18/2015   HGBA1C 6.7 (H) 08/07/2021     Assessment & Plan:   Problem List Items Addressed This Visit       Cardiovascular and Mediastinum   Type 2 diabetes mellitus with other  circulatory complications ([place])   Relevant Orders   CMP14+EGFR   Hemoglobin A1c   Microalbumin / creatinine urine ratio  Other   Dyslipidemia   Relevant Orders   Lipid panel   Annual physical exam - Primary    Labs today. Diabetic foot exam was performed.  This is located in the quality metrics section. Pneumococcal 20 was given today as well as flu vaccine. Patient is doing well.  Follow-up in 6 months.      Other Visit Diagnoses     Need for vaccination       Relevant Orders   Flu Vaccine QUAD High Dose(Fluad) (Completed)   Pneumococcal conjugate vaccine 20-valent (Prevnar 20) (Completed)   Screening for deficiency anemia       Relevant Orders   CBC       Follow-up:  6 months  [place]

## 2022-08-21 LAB — CMP14+EGFR
ALT: 30 IU/L (ref 0–44)
AST: 34 IU/L (ref 0–40)
Albumin/Globulin Ratio: 1.3 (ref 1.2–2.2)
Albumin: 3.9 g/dL (ref 3.8–4.8)
Alkaline Phosphatase: 85 IU/L (ref 44–121)
BUN/Creatinine Ratio: 8 — ABNORMAL LOW (ref 10–24)
BUN: 9 mg/dL (ref 8–27)
Bilirubin Total: 1.2 mg/dL (ref 0.0–1.2)
CO2: 18 mmol/L — ABNORMAL LOW (ref 20–29)
Calcium: 9.8 mg/dL (ref 8.6–10.2)
Chloride: 101 mmol/L (ref 96–106)
Creatinine, Ser: 1.09 mg/dL (ref 0.76–1.27)
Globulin, Total: 3 g/dL (ref 1.5–4.5)
Glucose: 91 mg/dL (ref 70–99)
Potassium: 4.9 mmol/L (ref 3.5–5.2)
Sodium: 134 mmol/L (ref 134–144)
Total Protein: 6.9 g/dL (ref 6.0–8.5)
eGFR: 69 mL/min/{1.73_m2} (ref >59)

## 2022-08-21 LAB — MICROALBUMIN / CREATININE URINE RATIO
Creatinine, Urine: 113.9 mg/dL
Microalb/Creat Ratio: 145 mg/g creat — ABNORMAL HIGH (ref 0–29)
Microalbumin, Urine: 165.3 ug/mL

## 2022-08-21 LAB — CBC
Hematocrit: 39.8 % (ref 37.5–51.0)
Hemoglobin: 12.4 g/dL — ABNORMAL LOW (ref 13.0–17.7)
MCH: 23.2 pg — ABNORMAL LOW (ref 26.6–33.0)
MCHC: 31.2 g/dL — ABNORMAL LOW (ref 31.5–35.7)
MCV: 75 fL — ABNORMAL LOW (ref 79–97)
Platelets: 216 10*3/uL (ref 150–450)
RBC: 5.34 x10E6/uL (ref 4.14–5.80)
RDW: 16.5 % — ABNORMAL HIGH (ref 11.6–15.4)
WBC: 9 10*3/uL (ref 3.4–10.8)

## 2022-08-21 LAB — HEMOGLOBIN A1C
Est. average glucose Bld gHb Est-mCnc: 131 mg/dL
Hgb A1c MFr Bld: 6.2 % — ABNORMAL HIGH (ref 4.8–5.6)

## 2022-08-21 LAB — LIPID PANEL
Chol/HDL Ratio: 2.4 ratio (ref 0.0–5.0)
Cholesterol, Total: 95 mg/dL — ABNORMAL LOW (ref 100–199)
HDL: 40 mg/dL (ref >39)
LDL Chol Calc (NIH): 40 mg/dL (ref 0–99)
Triglycerides: 68 mg/dL (ref 0–149)
VLDL Cholesterol Cal: 15 mg/dL (ref 5–40)

## 2022-08-24 NOTE — Addendum Note (Signed)
Addended by: Dairl Ponder on: 08/24/2022 09:36 AM   Modules accepted: Orders

## 2022-08-25 ENCOUNTER — Other Ambulatory Visit: Payer: Self-pay | Admitting: Family Medicine

## 2022-08-25 LAB — IRON,TIBC AND FERRITIN PANEL
Ferritin: 26 ng/mL — ABNORMAL LOW (ref 30–400)
Iron Saturation: 12 % — ABNORMAL LOW (ref 15–55)
Iron: 44 ug/dL (ref 38–169)
Total Iron Binding Capacity: 376 ug/dL (ref 250–450)
UIBC: 332 ug/dL (ref 111–343)

## 2022-08-25 MED ORDER — IRON (FERROUS SULFATE) 325 (65 FE) MG PO TABS: 325.0000 mg | ORAL_TABLET | ORAL | 1 refills | Status: DC

## 2022-09-07 ENCOUNTER — Other Ambulatory Visit: Payer: Self-pay | Admitting: Family Medicine

## 2022-09-14 ENCOUNTER — Other Ambulatory Visit: Payer: Self-pay | Admitting: Family Medicine

## 2022-09-14 DIAGNOSIS — R0609 Other forms of dyspnea: Secondary | ICD-10-CM

## 2022-10-08 ENCOUNTER — Other Ambulatory Visit: Payer: Self-pay | Admitting: Family Medicine

## 2022-10-11 ENCOUNTER — Other Ambulatory Visit: Payer: Self-pay | Admitting: Family Medicine

## 2022-11-13 ENCOUNTER — Ambulatory Visit (INDEPENDENT_AMBULATORY_CARE_PROVIDER_SITE_OTHER): Payer: Medicare Other

## 2022-11-13 VITALS — Ht 74.0 in | Wt 230.0 lb

## 2022-11-13 DIAGNOSIS — Z0000 Encounter for general adult medical examination without abnormal findings: Secondary | ICD-10-CM | POA: Diagnosis not present

## 2022-11-13 NOTE — Progress Notes (Signed)
Subjective:   Mitchell Oliver is a 77 y.o. male who presents for Medicare Annual/Subsequent preventive examination.  I connected with  Mitchell Oliver on 11/13/22 by a audio enabled telemedicine application and verified that I am speaking with the correct person using two identifiers.  Patient Location: Home  Provider Location: Office/Clinic  I discussed the limitations of evaluation and management by telemedicine. The patient expressed understanding and agreed to proceed.  Review of Systems     Cardiac Risk Factors include: advanced age (>44mn, >>62women);diabetes mellitus;dyslipidemia;male gender;hypertension     Objective:    Today's Vitals   11/13/22 1136  Weight: 230 lb (104.3 kg)  Height: '6\' 2"'$  (1.88 m)   Body mass index is 29.53 kg/m.     11/13/2022   12:08 PM 07/22/2021    8:52 AM 08/04/2019   11:32 AM 07/22/2015    7:34 AM 07/17/2015    5:00 AM 07/16/2015   10:01 PM 05/04/2014    8:38 AM  Advanced Directives  Does Patient Have a Medical Advance Directive? No No No No No No Patient does not have advance directive;Patient would not like information  Would patient like information on creating a medical advance directive? Yes (MAU/Ambulatory/Procedural Areas - Information given) No - Patient declined No - Patient declined No - patient declined information No - patient declined information No - patient declined information     Current Medications (verified) Outpatient Encounter Medications as of 11/13/2022  Medication Sig   albuterol (VENTOLIN HFA) 108 (90 Base) MCG/ACT inhaler INHALE 2 PUFFS INTO THE LUNGS EVERY 6 HOURS AS NEEDED FOR WHEEZING OR SHORTNESS OF BREATH   amLODipine (NORVASC) 2.5 MG tablet Take 1 tablet (2.5 mg total) by mouth daily.   Ascorbic Acid (VITAMIN C PO) Take 4 tablets by mouth daily. Takes 4000 mg daily   atorvastatin (LIPITOR) 80 MG tablet TAKE 1 TABLET BY MOUTH DAILY   benazepril (LOTENSIN) 20 MG tablet TAKE 1 TABLET(20 MG) BY MOUTH DAILY    Cholecalciferol 25 MCG (1000 UT) tablet Take by mouth.   clopidogrel (PLAVIX) 75 MG tablet TAKE 1 TABLET(75 MG) BY MOUTH DAILY   Cyanocobalamin (VITAMIN B-12 CR) 1000 MCG TBCR Take 1 tablet by mouth daily.   dorzolamide-timolol (COSOPT) 22.3-6.8 MG/ML ophthalmic solution SMARTSIG:1 Drop(s) In Eye(s) Every 12 Hours   fish oil-omega-3 fatty acids 1000 MG capsule Take 2 g by mouth daily.   fluticasone (FLONASE) 50 MCG/ACT nasal spray SHAKE LIQUID AND USE 1 SPRAY IN EACH NOSTRIL DAILY AS NEEDED FOR ALLERGIES OR RHINITIS   Iron, Ferrous Sulfate, 325 (65 Fe) MG TABS Take 325 mg by mouth every other day.   JARDIANCE 10 MG TABS tablet TAKE 1 TABLET(10 MG) BY MOUTH DAILY   latanoprost (XALATAN) 0.005 % ophthalmic solution Apply to eye.   metoprolol succinate (TOPROL XL) 25 MG 24 hr tablet Take 1 tablet (25 mg total) by mouth daily.   nitroGLYCERIN (NITROSTAT) 0.4 MG SL tablet Place 1 tablet (0.4 mg total) under the tongue every 5 (five) minutes as needed for chest pain (maxium 3 tablets daily). Do not take within 36 hrs of Cialis use   ofloxacin (OCUFLOX) 0.3 % ophthalmic solution    pantoprazole (PROTONIX) 40 MG tablet TAKE 1 TABLET(40 MG) BY MOUTH EVERY MORNING   potassium chloride SA (KLOR-CON M) 20 MEQ tablet TAKE 1 TABLET(20 MEQ) BY MOUTH DAILY   Zinc Acetate 50 MG CAPS Take 50 mg by mouth daily.    No facility-administered encounter medications on  file as of 11/13/2022.    Allergies (verified) Patient has no known allergies.   History: Past Medical History:  Diagnosis Date   Allergy    COPD (chronic obstructive pulmonary disease) ([place])    CVA (cerebral vascular accident) ([place]) 2012   denies residual on 07/22/2015   ED (erectile dysfunction)    Family history of adverse reaction to anesthesia    "think my mother had real bad headaches after anesthesia"   GERD (gastroesophageal reflux disease)    Hypercholesterolemia    Hypertension    IFG (impaired fasting glucose)    NSTEMI (non-ST  elevated myocardial infarction) ([place])    Pneumonia ~ 2005   Past Surgical History:  Procedure Laterality Date   APPENDECTOMY  2009 [facility]   BACK SURGERY     BIOPSY  05/04/2014   Procedure: BIOPSY;  Surgeon: Danie Binder, MD;  [facility];  Service: Endoscopy;;   CARDIAC CATHETERIZATION N/A 07/18/2015   Procedure: Left Heart Cath and Coronary Angiography;  Surgeon: Belva Crome, MD;  Location: [facility];  Service: Cardiovascular;  Laterality: N/A;   CARDIAC CATHETERIZATION N/A 07/18/2015   Procedure: Coronary Stent Intervention;  Surgeon: Belva Crome, MD;  Location: [facility];  Service: Cardiovascular;  Laterality: N/A;   CARDIAC CATHETERIZATION N/A 07/22/2015   Procedure: Coronary Stent Intervention;  Surgeon: Belva Crome, MD;  Location: [facility];  Service: Cardiovascular;  Laterality: N/A;   COLONOSCOPY  2009   COLONOSCOPY N/A 08/04/2019   Procedure: COLONOSCOPY;  Surgeon: Danie Binder, MD;  [facility];  Service: Endoscopy;  Laterality: N/A;  12:30   ESOPHAGOGASTRODUODENOSCOPY N/A 05/04/2014   Procedure: ESOPHAGOGASTRODUODENOSCOPY (EGD);  Surgeon: Danie Binder, MD;  [facility];  Service: Endoscopy;  Laterality: N/A;  9:15   POLYPECTOMY  08/04/2019   Procedure: POLYPECTOMY;  Surgeon: Danie Binder, MD;  [facility];  Service: Endoscopy;;  hepatic flexure, ascending colon, transverse colon, splenic flexure, descending colon    POSTERIOR LAMINECTOMY / DECOMPRESSION LUMBAR SPINE  06/03/2015   SAVORY DILATION N/A 05/04/2014   Procedure: SAVORY DILATION;  Surgeon: Danie Binder, MD;  [facility];  Service: Endoscopy;  Laterality: N/A;   Family History  Problem Relation Age of Onset   Hypertension Mother    Hypertension Father    Heart attack Father    Stroke Father    Colon cancer Neg Hx    Colon polyps Neg Hx    Social History   Socioeconomic History   Marital status: Divorced    Spouse  name: Not on file   Number of children: 3   Years of education: Not on file   Highest education level: Not on file  Occupational History   Occupation: Sales  Tobacco Use   Smoking status: Former    Packs/day: 2.00    Years: 24.00    Total pack years: 48.00    Types: Cigarettes    Start date: 1963    Quit date: 12/24/1985    Years since quitting: 36.9   Smokeless tobacco: Never  Substance and Sexual Activity   Alcohol use: Yes    Alcohol/week: 6.0 standard drinks of alcohol    Types: 2 Glasses of wine, 4 Cans of beer per week   Drug use: No   Sexual activity: Yes  Other Topics Concern   Not on file  Social History Narrative   3 daughters, 2 live locally, one  lives in [place].   3 grandsons   Active in grandson's travel ball. Works part time for [employer].    Social Determinants of Health   Financial Resource Strain: Low Risk  (11/13/2022)   Overall Financial Resource Strain (CARDIA)    Difficulty of Paying Living Expenses: Not hard at all  Food Insecurity: No Food Insecurity (11/13/2022)   Hunger Vital Sign    Worried About Running Out of Food in the Last Year: Never true    Ran Out of Food in the Last Year: Never true  Transportation Needs: No Transportation Needs (11/13/2022)   PRAPARE - Hydrologist (Medical): No    Lack of Transportation (Non-Medical): No  Physical Activity: Sufficiently Active (11/13/2022)   Exercise Vital Sign    Days of Exercise per Week: 5 days    Minutes of Exercise per Session: 60 min  Stress: No Stress Concern Present (11/13/2022)   [place]    Feeling of Stress : Not at all  Social Connections: Moderately Integrated (11/13/2022)   Social Connection and Isolation Panel [NHANES]    Frequency of Communication with Friends and Family: More than three times a week    Frequency of Social Gatherings with Friends and Family: Three times a week     Attends Religious Services: More than 4 times per year    Active Member of Clubs or Organizations: Yes    Attends Music therapist: More than 4 times per year    Marital Status: Divorced    Tobacco Counseling Counseling given: Not Answered   Clinical Intake:  Pre-visit preparation completed: Yes  Pain : No/denies pain  Diabetes: Yes CBG done?: No Did pt. bring in CBG monitor from home?: No  How often do you need to have someone help you when you read instructions, pamphlets, or other written materials from your doctor or pharmacy?: 1 - Never  Diabetic?Yes  Nutrition Risk Assessment:  Has the patient had any N/V/D within the last 2 months?  No  Does the patient have any non-healing wounds?  No  Has the patient had any unintentional weight loss or weight gain?  No   Diabetes:  Is the patient diabetic?  Yes  If diabetic, was a CBG obtained today?  No  Did the patient bring in their glucometer from home?  No  How often do you monitor your CBG's? Daily .   Financial Strains and Diabetes Management:  Are you having any financial strains with the device, your supplies or your medication? Yes .  Does the patient want to be seen by Chronic Care Management for management of their diabetes?  No  Would the patient like to be referred to a Nutritionist or for Diabetic Management?  No   Diabetic Exams:  Diabetic Eye Exam: Completed 08/12/22 Diabetic Foot Exam: Completed 08/19/22   Interpreter Needed?: No  Information entered by :: Denman George LPN   Activities of Daily Living    11/13/2022   12:08 PM  In your present state of health, do you have any difficulty performing the following activities:  Hearing? 0  Vision? 0  Difficulty concentrating or making decisions? 0  Walking or climbing stairs? 0  Dressing or bathing? 0  Doing errands, shopping? 0  Preparing Food and eating ? N  Using the Toilet? N  In the past six months, have you accidently leaked  urine? N  Do you have problems  with loss of bowel control? N  Managing your Medications? N  Managing your Finances? N  Housekeeping or managing your Housekeeping? N    Patient Care Team: Coral Spikes, DO as PCP - General (Family Medicine) Belva Crome, MD as PCP - Cardiology (Cardiology) Danie Binder, MD (Inactive) as Consulting Physician (Gastroenterology)  Indicate any recent Medical Services you may have received from other than [facility] providers in the past year (date may be approximate).     Assessment:   This is a routine wellness examination for [place].  Hearing/Vision screen Hearing Screening - Comments:: Denies hearing difficulties  Vision Screening - Comments:: Wears rx glasses - up to date with routine eye exams with Dr. Macarthur Critchley   Dietary issues and exercise activities discussed: Current Exercise Habits: Home exercise routine, Type of exercise: walking, Time (Minutes): 30, Frequency (Times/Week): 5, Weekly Exercise (Minutes/Week): 150   Goals Addressed   None    Depression Screen    11/13/2022   12:03 PM 08/19/2022    9:07 AM 08/07/2021    9:04 AM 07/22/2021    8:46 AM 04/02/2020    9:29 AM 07/19/2018    8:36 AM 07/15/2017    8:37 AM  PHQ 2/9 Scores  PHQ - 2 Score 0 0 0 0 0 0 0    Fall Risk    11/13/2022   11:37 AM 08/19/2022    9:07 AM 08/07/2021    9:05 AM 07/22/2021    8:53 AM 04/02/2020    9:25 AM  Fall Risk   Falls in the past year? 0 0 0 0 0  Number falls in past yr: 0 0  0   Injury with Fall? 0 0  0   Risk for fall due to :  No Fall Risks No Fall Risks Impaired vision   Follow up Falls prevention discussed;Education provided;Falls evaluation completed Falls evaluation completed Falls evaluation completed Falls prevention discussed Falls evaluation completed    FALL RISK PREVENTION PERTAINING TO THE HOME:  Any stairs in or around the home? Yes  If so, are there any without handrails? No  Home free of loose throw rugs in walkways, pet beds,  electrical cords, etc? Yes  Adequate lighting in your home to reduce risk of falls? Yes   ASSISTIVE DEVICES UTILIZED TO PREVENT FALLS:  Life alert? No  Use of a cane, walker or w/c? No  Grab bars in the bathroom? Yes  Shower chair or bench in shower? No  Elevated toilet seat or a handicapped toilet? Yes   TIMED UP AND GO:  Was the test performed? No .  Telephonic visit   Cognitive Function:        11/13/2022   12:08 PM 07/22/2021    8:55 AM  6CIT Screen  What Year? 0 points 0 points  What month? 0 points 0 points  What time? 0 points 0 points  Count back from 20 0 points 0 points  Months in reverse 0 points 0 points  Repeat phrase 0 points 0 points  Total Score 0 points 0 points    Immunizations Immunization History  Administered Date(s) Administered   Fluad Quad(high Dose 65+) 07/09/2020, 08/19/2022   Influenza, High Dose Seasonal PF 08/08/2014, 07/25/2019   Influenza,inj,Quad PF,6+ Mos 07/03/2015, 07/06/2016, 07/15/2017, 07/19/2018   Influenza-Unspecified 08/23/2013, 07/22/2021   PNEUMOCOCCAL CONJUGATE-20 08/19/2022   Pneumococcal Conjugate-13 08/28/2015   Pneumococcal Polysaccharide-23 11/26/2012   Zoster Recombinat (Shingrix) 06/26/2017, 08/26/2017, 09/17/2017   Zoster, Live 12/14/2012  TDAP status: Due, Education has been provided regarding the importance of this vaccine. Advised may receive this vaccine at local pharmacy or Health Dept. Aware to provide a copy of the vaccination record if obtained from local pharmacy or Health Dept. Verbalized acceptance and understanding.  Flu Vaccine status: Up to date  Pneumococcal vaccine status: Up to date  Covid-19 vaccine status: Information provided on how to obtain vaccines.   Qualifies for Shingles Vaccine? Yes   Zostavax completed No   Shingrix Completed?: Yes  Screening Tests Health Maintenance  Topic Date Due   DTaP/Tdap/Td (1 - Tdap) Never done   HEMOGLOBIN A1C  02/18/2023   OPHTHALMOLOGY EXAM   08/13/2023   Diabetic kidney evaluation - eGFR measurement  08/20/2023   Diabetic kidney evaluation - Urine ACR  08/20/2023   FOOT EXAM  08/20/2023   Medicare Annual Wellness (AWV)  11/14/2023   COLONOSCOPY (Pts 45-46yr Insurance coverage will need to be confirmed)  08/03/2029   Pneumonia Vaccine 77 Years old  Completed   INFLUENZA VACCINE  Completed   Hepatitis C Screening  Completed   Zoster Vaccines- Shingrix  Completed   HPV VACCINES  Aged Out   COVID-19 Vaccine  Discontinued    Health Maintenance  Health Maintenance Due  Topic Date Due   DTaP/Tdap/Td (1 - Tdap) Never done    Colorectal cancer screening: Type of screening: Colonoscopy. Completed 08/04/19. Repeat every 10 years  Lung Cancer Screening: (Low Dose CT Chest recommended if Age 79-80years, 30 pack-year currently smoking OR have quit w/in 15years.) does not qualify.   Lung Cancer Screening Referral: n/a  Additional Screening:  Hepatitis C Screening: does qualify; Completed 12/31/15  Vision Screening: Recommended annual ophthalmology exams for early detection of glaucoma and other disorders of the eye. Is the patient up to date with their annual eye exam?  Yes  Who is the provider or what is the name of the office in which the patient attends annual eye exams? Dr. JMacarthur CritchleyIf pt is not established with a provider, would they like to be referred to a provider to establish care? No .   Dental Screening: Recommended annual dental exams for proper oral hygiene  Community Resource Referral / Chronic Care Management: CRR required this visit?  No   CCM required this visit?  No      Plan:     I have personally reviewed and noted the following in the patient's chart:   Medical and social history Use of alcohol, tobacco or illicit drugs  Current medications and supplements including opioid prescriptions. Patient is not currently taking opioid prescriptions. Functional ability and status Nutritional  status Physical activity Advanced directives List of other physicians Hospitalizations, surgeries, and ER visits in previous 12 months Vitals Screenings to include cognitive, depression, and falls Referrals and appointments  In addition, I have reviewed and discussed with patient certain preventive protocols, quality metrics, and best practice recommendations. A written personalized care plan for preventive services as well as general preventive health recommendations were provided to patient.     SVanetta Mulders [place]  16/20/3559  Due to this being a virtual visit, the after visit summary with patients personalized plan was offered to patient via mail or my-chart. per request, patient was mailed a copy of AVS.  Nurse Notes: Patient states that he is having a hard time with the copay for his Jardiance.

## 2022-11-13 NOTE — Patient Instructions (Signed)
Mr. Mitchell Oliver , Thank you for taking time to come for your Medicare Wellness Visit. I appreciate your ongoing commitment to your health goals. Please review the following plan we discussed and let me know if I can assist you in the future.   These are the goals we discussed:  Goals      Have 3 meals a day     Eat healthy meals        This is a list of the screening recommended for you and due dates:  Health Maintenance  Topic Date Due   DTaP/Tdap/Td vaccine (1 - Tdap) Never done   Hemoglobin A1C  02/18/2023   Eye exam for diabetics  08/13/2023   Yearly kidney function blood test for diabetes  08/20/2023   Yearly kidney health urinalysis for diabetes  08/20/2023   Complete foot exam   08/20/2023   Medicare Annual Wellness Visit  11/14/2023   Colon Cancer Screening  08/03/2029   Pneumonia Vaccine  Completed   Flu Shot  Completed   Hepatitis C Screening: USPSTF Recommendation to screen - Ages 18-79 yo.  Completed   Zoster (Shingles) Vaccine  Completed   HPV Vaccine  Aged Out   COVID-19 Vaccine  Discontinued    Advanced directives: Advance directive discussed with you today. I have provided a copy for you to complete at home and have notarized. Once this is complete please bring a copy in to our office so we can scan it into your chart.   Conditions/risks identified: Aim for 30 minutes of exercise or brisk walking, 6-8 glasses of water, and 5 servings of fruits and vegetables each day.   Next appointment: Follow up in one year for your annual wellness visit.   Preventive Care 33 Years and Older, Male  Preventive care refers to lifestyle choices and visits with your health care provider that can promote health and wellness. What does preventive care include? A yearly physical exam. This is also called an annual well check. Dental exams once or twice a year. Routine eye exams. Ask your health care provider how often you should have your eyes checked. Personal lifestyle choices,  including: Daily care of your teeth and gums. Regular physical activity. Eating a healthy diet. Avoiding tobacco and drug use. Limiting alcohol use. Practicing safe sex. Taking low doses of aspirin every day. Taking vitamin and mineral supplements as recommended by your health care provider. What happens during an annual well check? The services and screenings done by your health care provider during your annual well check will depend on your age, overall health, lifestyle risk factors, and family history of disease. Counseling  Your health care provider may ask you questions about your: Alcohol use. Tobacco use. Drug use. Emotional well-being. Home and relationship well-being. Sexual activity. Eating habits. History of falls. Memory and ability to understand (cognition). Work and work Statistician. Screening  You may have the following tests or measurements: Height, weight, and BMI. Blood pressure. Lipid and cholesterol levels. These may be checked every 5 years, or more frequently if you are over 15 years old. Skin check. Lung cancer screening. You may have this screening every year starting at age 56 if you have a 30-pack-year history of smoking and currently smoke or have quit within the past 15 years. Fecal occult blood test (FOBT) of the stool. You may have this test every year starting at age 21. Flexible sigmoidoscopy or colonoscopy. You may have a sigmoidoscopy every 5 years or a colonoscopy every 10  years starting at age 5. Prostate cancer screening. Recommendations will vary depending on your family history and other risks. Hepatitis C blood test. Hepatitis B blood test. Sexually transmitted disease (STD) testing. Diabetes screening. This is done by checking your blood sugar (glucose) after you have not eaten for a while (fasting). You may have this done every 1-3 years. Abdominal aortic aneurysm (AAA) screening. You may need this if you are a current or former  smoker. Osteoporosis. You may be screened starting at age 23 if you are at high risk. Talk with your health care provider about your test results, treatment options, and if necessary, the need for more tests. Vaccines  Your health care provider may recommend certain vaccines, such as: Influenza vaccine. This is recommended every year. Tetanus, diphtheria, and acellular pertussis (Tdap, Td) vaccine. You may need a Td booster every 10 years. Zoster vaccine. You may need this after age 38. Pneumococcal 13-valent conjugate (PCV13) vaccine. One dose is recommended after age 48. Pneumococcal polysaccharide (PPSV23) vaccine. One dose is recommended after age 23. Talk to your health care provider about which screenings and vaccines you need and how often you need them. This information is not intended to replace advice given to you by your health care provider. Make sure you discuss any questions you have with your health care provider. Document Released: 11/08/2015 Document Revised: 07/01/2016 Document Reviewed: 08/13/2015 Elsevier Interactive Patient Education  2017 [employer] Prevention in the Home Falls can cause injuries. They can happen to people of all ages. There are many things you can do to make your home safe and to help prevent falls. What can I do on the outside of my home? Regularly fix the edges of walkways and driveways and fix any cracks. Remove anything that might make you trip as you walk through a door, such as a raised step or threshold. Trim any bushes or trees on the path to your home. Use bright outdoor lighting. Clear any walking paths of anything that might make someone trip, such as rocks or tools. Regularly check to see if handrails are loose or broken. Make sure that both sides of any steps have handrails. Any raised decks and porches should have guardrails on the edges. Have any leaves, snow, or ice cleared regularly. Use sand or salt on walking paths during  winter. Clean up any spills in your garage right away. This includes oil or grease spills. What can I do in the bathroom? Use night lights. Install grab bars by the toilet and in the tub and shower. Do not use towel bars as grab bars. Use non-skid mats or decals in the tub or shower. If you need to sit down in the shower, use a plastic, non-slip stool. Keep the floor dry. Clean up any water that spills on the floor as soon as it happens. Remove soap buildup in the tub or shower regularly. Attach bath mats securely with double-sided non-slip rug tape. Do not have throw rugs and other things on the floor that can make you trip. What can I do in the bedroom? Use night lights. Make sure that you have a light by your bed that is easy to reach. Do not use any sheets or blankets that are too big for your bed. They should not hang down onto the floor. Have a firm chair that has side arms. You can use this for support while you get dressed. Do not have throw rugs and other things on the floor  that can make you trip. What can I do in the kitchen? Clean up any spills right away. Avoid walking on wet floors. Keep items that you use a lot in easy-to-reach places. If you need to reach something above you, use a strong step stool that has a grab bar. Keep electrical cords out of the way. Do not use floor polish or wax that makes floors slippery. If you must use wax, use non-skid floor wax. Do not have throw rugs and other things on the floor that can make you trip. What can I do with my stairs? Do not leave any items on the stairs. Make sure that there are handrails on both sides of the stairs and use them. Fix handrails that are broken or loose. Make sure that handrails are as long as the stairways. Check any carpeting to make sure that it is firmly attached to the stairs. Fix any carpet that is loose or worn. Avoid having throw rugs at the top or bottom of the stairs. If you do have throw rugs,  attach them to the floor with carpet tape. Make sure that you have a light switch at the top of the stairs and the bottom of the stairs. If you do not have them, ask someone to add them for you. What else can I do to help prevent falls? Wear shoes that: Do not have high heels. Have rubber bottoms. Are comfortable and fit you well. Are closed at the toe. Do not wear sandals. If you use a stepladder: Make sure that it is fully opened. Do not climb a closed stepladder. Make sure that both sides of the stepladder are locked into place. Ask someone to hold it for you, if possible. Clearly mark and make sure that you can see: Any grab bars or handrails. First and last steps. Where the edge of each step is. Use tools that help you move around (mobility aids) if they are needed. These include: Canes. Walkers. Scooters. Crutches. Turn on the lights when you go into a dark area. Replace any light bulbs as soon as they burn out. Set up your furniture so you have a clear path. Avoid moving your furniture around. If any of your floors are uneven, fix them. If there are any pets around you, be aware of where they are. Review your medicines with your doctor. Some medicines can make you feel dizzy. This can increase your chance of falling. Ask your doctor what other things that you can do to help prevent falls. This information is not intended to replace advice given to you by your health care provider. Make sure you discuss any questions you have with your health care provider. Document Released: 08/08/2009 Document Revised: 03/19/2016 Document Reviewed: 11/16/2014 Elsevier Interactive Patient Education  2017 [employer].

## 2022-11-19 ENCOUNTER — Other Ambulatory Visit: Payer: Self-pay | Admitting: Family Medicine

## 2022-12-14 ENCOUNTER — Other Ambulatory Visit: Payer: Self-pay | Admitting: Family Medicine

## 2022-12-14 DIAGNOSIS — Z9109 Other allergy status, other than to drugs and biological substances: Secondary | ICD-10-CM

## 2023-01-06 ENCOUNTER — Other Ambulatory Visit: Payer: Self-pay | Admitting: Family Medicine

## 2023-01-07 ENCOUNTER — Other Ambulatory Visit: Payer: Self-pay | Admitting: Family Medicine

## 2023-01-28 ENCOUNTER — Other Ambulatory Visit: Payer: Self-pay | Admitting: Family Medicine

## 2023-01-28 DIAGNOSIS — R0609 Other forms of dyspnea: Secondary | ICD-10-CM

## 2023-01-30 ENCOUNTER — Other Ambulatory Visit: Payer: Self-pay | Admitting: Family Medicine

## 2023-03-20 ENCOUNTER — Other Ambulatory Visit: Payer: Self-pay | Admitting: Family Medicine

## 2023-03-20 DIAGNOSIS — R0609 Other forms of dyspnea: Secondary | ICD-10-CM

## 2023-03-31 ENCOUNTER — Other Ambulatory Visit: Payer: Self-pay | Admitting: Family Medicine

## 2023-04-02 ENCOUNTER — Other Ambulatory Visit: Payer: Self-pay | Admitting: Family Medicine

## 2023-04-23 ENCOUNTER — Other Ambulatory Visit: Payer: Self-pay | Admitting: Family Medicine

## 2023-04-23 DIAGNOSIS — Z9109 Other allergy status, other than to drugs and biological substances: Secondary | ICD-10-CM

## 2023-04-28 ENCOUNTER — Other Ambulatory Visit: Payer: Self-pay | Admitting: Family Medicine

## 2023-06-06 ENCOUNTER — Other Ambulatory Visit: Payer: Self-pay | Admitting: Nurse Practitioner

## 2023-06-11 ENCOUNTER — Other Ambulatory Visit: Payer: Self-pay | Admitting: Family Medicine

## 2023-06-18 ENCOUNTER — Other Ambulatory Visit: Payer: Self-pay | Admitting: Family Medicine

## 2023-06-18 DIAGNOSIS — R0609 Other forms of dyspnea: Secondary | ICD-10-CM

## 2023-06-25 ENCOUNTER — Other Ambulatory Visit: Payer: Self-pay | Admitting: Family Medicine

## 2023-07-08 ENCOUNTER — Telehealth: Payer: Self-pay | Admitting: Family Medicine

## 2023-07-08 NOTE — Telephone Encounter (Signed)
Patient is requesting something cheaper than  jardiance 10mg   called into [facility] he states can't afford it,

## 2023-07-09 ENCOUNTER — Other Ambulatory Visit: Payer: Self-pay | Admitting: Family Medicine

## 2023-07-09 NOTE — Telephone Encounter (Signed)
[name], DO     Forward this to [facility]. He has diabetes and proteinuria. This is a good medication for him. She if she can help with cost.

## 2023-07-13 ENCOUNTER — Telehealth: Payer: Self-pay | Admitting: Pharmacist

## 2023-07-13 ENCOUNTER — Other Ambulatory Visit: Payer: Self-pay | Admitting: Family Medicine

## 2023-07-13 DIAGNOSIS — R0609 Other forms of dyspnea: Secondary | ICD-10-CM

## 2023-07-13 DIAGNOSIS — E1159 Type 2 diabetes mellitus with other circulatory complications: Secondary | ICD-10-CM

## 2023-07-13 NOTE — Telephone Encounter (Signed)
   77 year old male with type 2 diabetes (A1c 6.2, present proteinuria), hypertension, CAD status post angioplasty, COPD, history of melanoma, history of CVA, dyslipidemia.  Patient needs assistance with SGLT2 cost (Jardiance).  We may have to switch patient to [place] due to ease of patient assistance.  Appt to be scheduled with PharmD.  Kieth Brightly, PharmD, BCACP Clinical Pharmacist, [facility]

## 2023-07-14 ENCOUNTER — Telehealth: Payer: Self-pay

## 2023-07-14 NOTE — Progress Notes (Unsigned)
Care Guide Note  07/14/2023 Name: DAIR NECESSARY MRN: [id number] DOB: [birth year]  Referred by: [name], DO Reason for referral : Care Coordination (Outreach to schedule with Pharm d )   Mitchell Oliver is a 77 y.o. year old male who is a primary care patient of [name], DO. Mitchell Oliver was referred to the pharmacist for assistance related to DM.    An unsuccessful telephone outreach was attempted today to contact the patient who was referred to the pharmacy team for assistance with medication assistance. Additional attempts will be made to contact the patient.   [name], RMA Care Guide [facility]  [place], [place] [address] Direct Dial: [phone] [email or web address]

## 2023-07-16 ENCOUNTER — Other Ambulatory Visit: Payer: Self-pay | Admitting: Nurse Practitioner

## 2023-07-16 NOTE — Progress Notes (Unsigned)
Care Guide Note  07/16/2023 Name: TRITON ALTICE MRN: [id number] DOB: 1946-07-27  Referred by: [name], DO Reason for referral : Care Coordination (Outreach to schedule with Pharm d )   KHADAR CONBOY is a 77 y.o. year old male who is a primary care patient of [name], DO. TERRIANCE PAVESE was referred to the pharmacist for assistance related to DM.    A second unsuccessful telephone outreach was attempted today to contact the patient who was referred to the pharmacy team for assistance with medication assistance. Additional attempts will be made to contact the patient.  [name], RMA Care Guide [facility]  [place], [place] [address] Direct Dial: [phone] [email or web address]

## 2023-07-22 NOTE — Progress Notes (Signed)
Care Guide Note  07/22/2023 Name: Mitchell Oliver MRN: [id number] DOB: 1946-01-18  Referred by: [name], DO Reason for referral : Care Coordination (Outreach to schedule with Pharm d )   Mitchell Oliver is a 77 y.o. year old male who is a primary care patient of [name], DO. Mitchell Oliver was referred to the pharmacist for assistance related to DM.    Successful contact was made with the patient to discuss pharmacy services including being ready for the pharmacist to call at least 5 minutes before the scheduled appointment time, to have medication bottles and any blood sugar or blood pressure readings ready for review. The patient agreed to meet with the pharmacist via with the pharmacist via telephone visit on (date/time).  08/04/2023  [name], RMA Care Guide [facility]  [place], [place] [address] Direct Dial: [phone] [email or web address]

## 2023-07-28 ENCOUNTER — Other Ambulatory Visit: Payer: Self-pay | Admitting: Family Medicine

## 2023-07-29 ENCOUNTER — Encounter: Payer: Self-pay | Admitting: Family Medicine

## 2023-07-29 ENCOUNTER — Ambulatory Visit (INDEPENDENT_AMBULATORY_CARE_PROVIDER_SITE_OTHER): Payer: Medicare Other | Admitting: Family Medicine

## 2023-07-29 VITALS — BP 119/63 | HR 45 | Temp 97.3°F | Ht 74.0 in | Wt 226.0 lb

## 2023-07-29 DIAGNOSIS — R112 Nausea with vomiting, unspecified: Secondary | ICD-10-CM | POA: Diagnosis not present

## 2023-07-29 DIAGNOSIS — D509 Iron deficiency anemia, unspecified: Secondary | ICD-10-CM

## 2023-07-29 DIAGNOSIS — E1159 Type 2 diabetes mellitus with other circulatory complications: Secondary | ICD-10-CM | POA: Diagnosis not present

## 2023-07-29 MED ORDER — ONDANSETRON 4 MG PO TBDP: 4.0000 mg | ORAL_TABLET | Freq: Three times a day (TID) | ORAL | 0 refills | Status: DC | PRN

## 2023-07-29 NOTE — Patient Instructions (Signed)
Medication as directed.  Labs ASAP.  We will call with results.

## 2023-07-29 NOTE — Assessment & Plan Note (Signed)
Etiology and prognosis unclear at this time. Given weight loss, I am concerned. Labs for further evaluation today. Zofran as needed.

## 2023-07-29 NOTE — Progress Notes (Signed)
Subjective:  Patient ID: Mitchell Oliver, male    DOB: 1945/11/17  Age: 77 y.o. MRN: [id number]  CC: Chief Complaint  Patient presents with   Nausea    Yesterday and today   Diarrhea    One month on and off    HPI:  77 year old male with CAD, DM-2, HTN, COPD, Hx of stroke, Dyslipidemia presents for evaluation of the above.   Ongoing diarrhea, nausea for the past month. Has had some intermittent vomiting. Some abdominal pain but none currently. Current weight 226. Was 240 in August of 2023. No blood in the stool. No hematemesis. No medication or dietary changes. Has not been seen since last year.   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   CAD S/P percutaneous coronary angioplasty 07/22/2015   Essential hypertension, benign 06/22/2013   Dyslipidemia 06/22/2013   Type 2 diabetes mellitus with other circulatory complications (HCC) 06/22/2013   Erectile dysfunction 06/22/2013   History of CVA 2012 06/22/2013    Social Hx   Social History   Socioeconomic History   Marital status: Divorced    Spouse name: Not on file   Number of children: 3   Years of education: Not on file   Highest education level: Not on file  Occupational History   Occupation: Sales  Tobacco Use   Smoking status: Former    Current packs/day: 0.00    Average packs/day: 2.0 packs/day for 24.2 years (48.3 ttl pk-yrs)    Types: Cigarettes    Start date: 1963    Quit date: 12/24/1985    Years since quitting: 37.6   Smokeless tobacco: Never  Substance and Sexual Activity   Alcohol use: Yes    Alcohol/week: 6.0 standard drinks of alcohol    Types: 2 Glasses of wine, 4 Cans of beer per week   Drug use: No   Sexual activity: Yes  Other Topics Concern   Not on file  Social History Narrative   3 daughters, 2 live locally, one lives  in [place].   3 grandsons   Active in grandson's travel ball. Works part time for [employer].    Social Determinants of Health   Financial Resource Strain: Low Risk  (11/13/2022)   Overall Financial Resource Strain (CARDIA)    Difficulty of Paying Living Expenses: Not hard at all  Food Insecurity: No Food Insecurity (11/13/2022)   Hunger Vital Sign    Worried About Running Out of Food in the Last Year: Never true    Ran Out of Food in the Last Year: Never true  Transportation Needs: No Transportation Needs (11/13/2022)   PRAPARE - Administrator, Civil Service (Medical): No    Lack of Transportation (Non-Medical): No  Physical Activity: Sufficiently Active (11/13/2022)   Exercise Vital Sign    Days of Exercise per Week: 5 days    Minutes of Exercise per Session: 60 min  Stress: No Stress Concern Present (11/13/2022)   [employer] of Occupational Health - Occupational Stress Questionnaire    Feeling of Stress : Not at all  Social Connections: Moderately Integrated (11/13/2022)   Social Connection and Isolation Panel [NHANES]    Frequency of Communication with Friends and Family: More than three times a week    Frequency of Social Gatherings with  Friends and Family: Three times a week    Attends Religious Services: More than 4 times per year    Active Member of Clubs or Organizations: Yes    Attends Engineer, structural: More than 4 times per year    Marital Status: Divorced    Review of Systems Per HPI  Objective:  BP 119/63   Pulse (!) 45   Temp (!) 97.3 F (36.3 C)   Ht 6\' 2"  (1.88 m)   Wt 226 lb (102.5 kg)   SpO2 97%   BMI 29.02 kg/m      07/29/2023    4:08 PM 11/13/2022   11:36 AM 08/19/2022    9:01 AM  BP/Weight  Systolic BP 119 -- 149  Diastolic BP 63 -- 80  Wt. (Lbs) 226 230 239  BMI 29.02 kg/m2 29.53 kg/m2 30.69 kg/m2    Physical Exam Vitals and nursing note reviewed.  Constitutional:      General: He is not in acute  distress.    Appearance: Normal appearance.  HENT:     Head: Normocephalic and atraumatic.     Mouth/Throat:     Pharynx: Oropharynx is clear.  Eyes:     General:        Right eye: No discharge.        Left eye: No discharge.     Conjunctiva/sclera: Conjunctivae normal.  Cardiovascular:     Rate and Rhythm: Regular rhythm. Bradycardia present.     Heart sounds: Murmur heard.  Pulmonary:     Effort: Pulmonary effort is normal.     Breath sounds: Normal breath sounds. No wheezing or rales.  Abdominal:     General: There is no distension.     Palpations: Abdomen is soft.     Tenderness: There is no abdominal tenderness.  Neurological:     Mental Status: He is alert.     Lab Results  Component Value Date   WBC 9.0 08/19/2022   HGB 12.4 (L) 08/19/2022   HCT 39.8 08/19/2022   PLT 216 08/19/2022   GLUCOSE 91 08/19/2022   CHOL 95 (L) 08/19/2022   TRIG 68 08/19/2022   HDL 40 08/19/2022   LDLCALC 40 08/19/2022   ALT 30 08/19/2022   AST 34 08/19/2022   NA 134 08/19/2022   K 4.9 08/19/2022   CL 101 08/19/2022   CREATININE 1.09 08/19/2022   BUN 9 08/19/2022   CO2 18 (L) 08/19/2022   TSH 0.969 07/17/2015   PSA 0.71 07/03/2014   INR 1.11 07/18/2015   HGBA1C 6.2 (H) 08/19/2022     Assessment & Plan:   Problem List Items Addressed This Visit       Cardiovascular and Mediastinum   Type 2 diabetes mellitus with other circulatory complications (HCC)   Relevant Orders   Microalbumin / creatinine urine ratio   Hemoglobin A1c     Digestive   Nausea and vomiting - Primary    Etiology and prognosis unclear at this time. Given weight loss, I am concerned. Labs for further evaluation today. Zofran as needed.       Relevant Orders   CMP14+EGFR   Lipase     Other   Iron deficiency anemia   Relevant Orders   CBC   Iron, TIBC and Ferritin Panel    Meds ordered this encounter  Medications   ondansetron (ZOFRAN-ODT) 4 MG disintegrating tablet    Sig: Take 1 tablet  (4 mg total) by mouth every 8 (eight) hours  as needed for nausea or vomiting.    Dispense:  20 tablet    Refill:  0    Follow-up:  Return in about 2 weeks (around 08/12/2023).  Everlene Other DO [facility]

## 2023-07-31 LAB — CMP14+EGFR
ALT: 35 [IU]/L (ref 0–44)
AST: 37 [IU]/L (ref 0–40)
Albumin: 3.4 g/dL — ABNORMAL LOW (ref 3.8–4.8)
Alkaline Phosphatase: 102 [IU]/L (ref 44–121)
BUN/Creatinine Ratio: 5 — ABNORMAL LOW (ref 10–24)
BUN: 5 mg/dL — ABNORMAL LOW (ref 8–27)
Bilirubin Total: 1.3 mg/dL — ABNORMAL HIGH (ref 0.0–1.2)
CO2: 19 mmol/L — ABNORMAL LOW (ref 20–29)
Calcium: 9 mg/dL (ref 8.6–10.2)
Chloride: 102 mmol/L (ref 96–106)
Creatinine, Ser: 1.1 mg/dL (ref 0.76–1.27)
Globulin, Total: 2.8 g/dL (ref 1.5–4.5)
Glucose: 113 mg/dL — ABNORMAL HIGH (ref 70–99)
Potassium: 4.2 mmol/L (ref 3.5–5.2)
Sodium: 134 mmol/L (ref 134–144)
Total Protein: 6.2 g/dL (ref 6.0–8.5)
eGFR: 68 mL/min/{1.73_m2} (ref >59)

## 2023-07-31 LAB — IRON,TIBC AND FERRITIN PANEL
Ferritin: 113 ng/mL (ref 30–400)
Iron Saturation: 28 % (ref 15–55)
Iron: 68 ug/dL (ref 38–169)
Total Iron Binding Capacity: 241 ug/dL — ABNORMAL LOW (ref 250–450)
UIBC: 173 ug/dL (ref 111–343)

## 2023-07-31 LAB — MICROALBUMIN / CREATININE URINE RATIO
Creatinine, Urine: 105.9 mg/dL
Microalb/Creat Ratio: 175 mg/g{creat} — ABNORMAL HIGH (ref 0–29)
Microalbumin, Urine: 185.5 ug/mL

## 2023-07-31 LAB — CBC
Hematocrit: 44.7 % (ref 37.5–51.0)
Hemoglobin: 14.7 g/dL (ref 13.0–17.7)
MCH: 31.1 pg (ref 26.6–33.0)
MCHC: 32.9 g/dL (ref 31.5–35.7)
MCV: 95 fL (ref 79–97)
Platelets: 180 10*3/uL (ref 150–450)
RBC: 4.73 x10E6/uL (ref 4.14–5.80)
RDW: 13 % (ref 11.6–15.4)
WBC: 8.3 10*3/uL (ref 3.4–10.8)

## 2023-07-31 LAB — HEMOGLOBIN A1C
Est. average glucose Bld gHb Est-mCnc: 120 mg/dL
Hgb A1c MFr Bld: 5.8 % — ABNORMAL HIGH (ref 4.8–5.6)

## 2023-07-31 LAB — LIPASE: Lipase: 58 U/L (ref 13–78)

## 2023-08-02 ENCOUNTER — Telehealth: Payer: Self-pay | Admitting: Family Medicine

## 2023-08-02 ENCOUNTER — Other Ambulatory Visit: Payer: Self-pay

## 2023-08-02 DIAGNOSIS — R112 Nausea with vomiting, unspecified: Secondary | ICD-10-CM

## 2023-08-02 NOTE — Telephone Encounter (Signed)
Daughter (nicole) called about patient . She went to visit him yesterday. She states his eyes are weak,pale skin  and not eating much and still nausea. Please advise Daughter is not DPR.

## 2023-08-03 ENCOUNTER — Telehealth: Payer: Self-pay

## 2023-08-03 NOTE — Telephone Encounter (Signed)
Patient states has a gastroenterology in mind and will call back tomorrow with her information.

## 2023-08-04 ENCOUNTER — Telehealth: Payer: Self-pay | Admitting: Gastroenterology

## 2023-08-04 ENCOUNTER — Encounter: Payer: Self-pay | Admitting: Pharmacist

## 2023-08-04 ENCOUNTER — Other Ambulatory Visit: Payer: Medicare Other | Admitting: Pharmacist

## 2023-08-04 NOTE — Telephone Encounter (Signed)
Spoken with patient and informed , see notes

## 2023-08-05 NOTE — Telephone Encounter (Signed)
Good morning Dr. Barron Alvine,  We received a call from this patient wishing to transfer his care to you due to being recommended. He was a previous patient of [facility] and had a colonoscopy in 2020 but states he would like somewhere closer to his home so we won't have to travel far. He is needing to be seen for nausea and vomiting. Records are in Epic for you to review. Would you please advise on scheduling?  Thank you.

## 2023-08-12 ENCOUNTER — Ambulatory Visit: Payer: Medicare Other | Admitting: Family Medicine

## 2023-08-12 ENCOUNTER — Ambulatory Visit: Payer: Medicare Other | Admitting: Physician Assistant

## 2023-08-12 VITALS — BP 146/71 | HR 50 | Temp 97.3°F | Wt 228.0 lb

## 2023-08-12 DIAGNOSIS — R634 Abnormal weight loss: Secondary | ICD-10-CM | POA: Diagnosis not present

## 2023-08-12 DIAGNOSIS — R112 Nausea with vomiting, unspecified: Secondary | ICD-10-CM

## 2023-08-12 DIAGNOSIS — R197 Diarrhea, unspecified: Secondary | ICD-10-CM

## 2023-08-12 NOTE — Patient Instructions (Signed)
Referral is in again - Dr. Marletta Lor.  Call with concerns.  Follow up in 3 months or sooner if needed.

## 2023-08-15 NOTE — Progress Notes (Signed)
Subjective:  Patient ID: Mitchell Oliver, male    DOB: 1946/07/15  Age: 77 y.o. MRN: [id number]  CC:   Chief Complaint  Patient presents with   2 week follow up nausea, loose stool    Referral to local GI at [address]     HPI:  77 year old male with the below mentioned medical problems presents for follow up.  Patient has not yet seen GI. He initially wanted to get seen locally but then decided to be seen at [facility]. He has now changed his mind and wants to be seen locally. He states that nausea, vomiting, and diarrhea improved but then returned.  Work up unrevealing. Endorses compliance with Protonix and Zofran. Weight up a little.   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   CAD S/P percutaneous coronary angioplasty 07/22/2015   Essential hypertension, benign 06/22/2013   Dyslipidemia 06/22/2013   Type 2 diabetes mellitus with other circulatory complications (HCC) 06/22/2013   Erectile dysfunction 06/22/2013   History of CVA 2012 06/22/2013    Social Hx   Social History   Socioeconomic History   Marital status: Divorced    Spouse name: Not on file   Number of children: 3   Years of education: Not on file   Highest education level: Not on file  Occupational History   Occupation: Sales  Tobacco Use   Smoking status: Former    Current packs/day: 0.00    Average packs/day: 2.0 packs/day for 24.2 years (48.3 ttl pk-yrs)    Types: Cigarettes    Start date: 1963    Quit date: 12/24/1985    Years since quitting: 37.6   Smokeless tobacco: Never  Substance and Sexual Activity   Alcohol use: Yes    Alcohol/week: 6.0 standard drinks of alcohol    Types: 2 Glasses of wine, 4 Cans of beer per week   Drug use: No   Sexual activity: Yes  Other Topics Concern   Not on file  Social History Narrative   3  daughters, 2 live locally, one lives in [place].   3 grandsons   Active in grandson's travel ball. Works part time for [employer].    Social Determinants of Health   Financial Resource Strain: Low Risk  (11/13/2022)   Overall Financial Resource Strain (CARDIA)    Difficulty of Paying Living Expenses: Not hard at all  Food Insecurity: No Food Insecurity (11/13/2022)   Hunger Vital Sign    Worried About Running Out of Food in the Last Year: Never true    Ran Out of Food in the Last Year: Never true  Transportation Needs: No Transportation Needs (11/13/2022)   PRAPARE - Administrator, Civil Service (Medical): No    Lack of Transportation (Non-Medical): No  Physical Activity: Sufficiently Active (11/13/2022)   Exercise Vital Sign    Days of Exercise per Week: 5 days    Minutes of Exercise per Session: 60 min  Stress: No Stress Concern Present (11/13/2022)   [employer] of Occupational Health - Occupational Stress Questionnaire    Feeling of Stress : Not at all  Social Connections: Moderately Integrated (11/13/2022)   Social Connection and Isolation Panel [NHANES]    Frequency of Communication with Friends and Family: More than three times a week  Frequency of Social Gatherings with Friends and Family: Three times a week    Attends Religious Services: More than 4 times per year    Active Member of Clubs or Organizations: Yes    Attends Engineer, structural: More than 4 times per year    Marital Status: Divorced    Review of Systems Per HPI  Objective:  BP (!) 146/71   Pulse (!) 50   Temp (!) 97.3 F (36.3 C)   Wt 228 lb (103.4 kg)   SpO2 95%   BMI 29.27 kg/m      08/12/2023    3:05 PM 07/29/2023    4:08 PM 11/13/2022   11:36 AM  BP/Weight  Systolic BP 146 119 --  Diastolic BP 71 63 --  Wt. (Lbs) 228 226 230  BMI 29.27 kg/m2 29.02 kg/m2 29.53 kg/m2    Physical Exam Vitals and nursing note reviewed.  Constitutional:      General:  He is not in acute distress.    Appearance: Normal appearance.  HENT:     Head: Normocephalic and atraumatic.  Eyes:     General:        Right eye: No discharge.        Left eye: No discharge.     Conjunctiva/sclera: Conjunctivae normal.  Cardiovascular:     Rate and Rhythm: Normal rate and regular rhythm.  Pulmonary:     Effort: Pulmonary effort is normal.     Breath sounds: Normal breath sounds. No wheezing, rhonchi or rales.  Neurological:     Mental Status: He is alert.  Psychiatric:        Mood and Affect: Mood normal.        Behavior: Behavior normal.     Lab Results  Component Value Date   WBC 8.3 07/30/2023   HGB 14.7 07/30/2023   HCT 44.7 07/30/2023   PLT 180 07/30/2023   GLUCOSE 113 (H) 07/30/2023   CHOL 95 (L) 08/19/2022   TRIG 68 08/19/2022   HDL 40 08/19/2022   LDLCALC 40 08/19/2022   ALT 35 07/30/2023   AST 37 07/30/2023   NA 134 07/30/2023   K 4.2 07/30/2023   CL 102 07/30/2023   CREATININE 1.10 07/30/2023   BUN 5 (L) 07/30/2023   CO2 19 (L) 07/30/2023   TSH 0.969 07/17/2015   PSA 0.71 07/03/2014   INR 1.11 07/18/2015   HGBA1C 5.8 (H) 07/30/2023     Assessment & Plan:   Problem List Items Addressed This Visit       Digestive   Nausea and vomiting - Primary    Continue current meds. Referral placed to GI locally.      Relevant Orders   Ambulatory referral to Gastroenterology   Other Visit Diagnoses     Diarrhea, unspecified type       Relevant Orders   Ambulatory referral to Gastroenterology   Weight loss       Relevant Orders   Ambulatory referral to Gastroenterology       Follow-up:  Return in about 3 months (around 11/12/2023) for Follow up Chronic medical issues.  Everlene Other DO [facility]

## 2023-08-15 NOTE — Assessment & Plan Note (Signed)
Continue current meds. Referral placed to GI locally.

## 2023-08-18 ENCOUNTER — Encounter: Payer: Self-pay | Admitting: Internal Medicine

## 2023-08-18 ENCOUNTER — Ambulatory Visit (INDEPENDENT_AMBULATORY_CARE_PROVIDER_SITE_OTHER): Payer: Medicare Other | Admitting: Internal Medicine

## 2023-08-18 VITALS — BP 138/76 | HR 73 | Temp 97.5°F | Ht 74.0 in | Wt 234.6 lb

## 2023-08-18 DIAGNOSIS — R197 Diarrhea, unspecified: Secondary | ICD-10-CM

## 2023-08-18 DIAGNOSIS — R152 Fecal urgency: Secondary | ICD-10-CM

## 2023-08-18 DIAGNOSIS — R112 Nausea with vomiting, unspecified: Secondary | ICD-10-CM | POA: Diagnosis not present

## 2023-08-18 DIAGNOSIS — R109 Unspecified abdominal pain: Secondary | ICD-10-CM

## 2023-08-18 DIAGNOSIS — K219 Gastro-esophageal reflux disease without esophagitis: Secondary | ICD-10-CM

## 2023-08-18 DIAGNOSIS — R1030 Lower abdominal pain, unspecified: Secondary | ICD-10-CM

## 2023-08-18 MED ORDER — ONDANSETRON 4 MG PO TBDP: 4.0000 mg | ORAL_TABLET | Freq: Three times a day (TID) | ORAL | 0 refills | Status: DC | PRN

## 2023-08-18 MED ORDER — PANTOPRAZOLE SODIUM 40 MG PO TBEC: DELAYED_RELEASE_TABLET | ORAL | 3 refills | Status: DC

## 2023-08-18 NOTE — Progress Notes (Signed)
Primary Care Physician:  [name], DO Primary Gastroenterologist:  Dr. Marletta Lor  Chief Complaint  Patient presents with   New Patient (Initial Visit)    Patient here today as a new patient here. He had seen Dr. Jonette Eva in the past. Patient has been having a lot of nausea as of late was given zofran by PCP but has since ran out, and has had issues wih diarrhea. He has been taking imodium prn.    HPI:   Mitchell Oliver is a 77 y.o. male who presents to the clinic today by referral from his PCP Dr. Adriana Simas for evaluation.  Patient states he was in his normal state of health until approximately 1 month ago when he had sudden onset nausea vomiting diarrhea.  Nausea vomiting worse in the AM.  States he has only vomited a few times in his whole life so this was quite a drastic change.  Also notes associated diarrhea as well as fecal urgency.  Notes multiple loose bowel movements daily.  No melena hematochezia.  Does note abdominal pain primarily lower intermittent, mild to moderate.  Feels sharp.  Blood work 07/30/2023 largely unremarkable.  No new medications prior to onset of symptoms.  Does not recall eating out or any family picnic/gatherings prior to symptoms.  Fortunately, he states his symptoms have resolved as of 1 week ago.  Stools are formed now.  No further diarrhea.  Abdominal pain has resolved.  No further vomiting.  Does note 1 episode of nausea 2 days ago in the a.m. which subsided.  Does have chronic GERD which is well-controlled on pantoprazole daily.  Denies any dysphagia odynophagia.  No epigastric or chest pain.  EGD 05/04/2014 with distal esophageal stricture, gastritis, underwent esophageal dilation.  Biopsies with chronic gastritis, focal intestinal metaplasia, negative for H. pylori.   Past Medical History:  Diagnosis Date   Allergy    COPD (chronic obstructive pulmonary disease) (HCC)    CVA (cerebral vascular accident) (HCC) 2012   denies residual on  07/22/2015   ED (erectile dysfunction)    Family history of adverse reaction to anesthesia    "think my mother had real bad headaches after anesthesia"   GERD (gastroesophageal reflux disease)    Hypercholesterolemia    Hypertension    IFG (impaired fasting glucose)    NSTEMI (non-ST elevated myocardial infarction) (HCC)    Pneumonia ~ 2005    Past Surgical History:  Procedure Laterality Date   APPENDECTOMY  2009 [facility]   BACK SURGERY     BIOPSY  05/04/2014   Procedure: BIOPSY;  Surgeon: West Bali, MD;  [facility];  Service: Endoscopy;;   CARDIAC CATHETERIZATION N/A 07/18/2015   Procedure: Left Heart Cath and Coronary Angiography;  Surgeon: Lyn Records, MD;  Location: [facility] INVASIVE CV LAB;  Service: Cardiovascular;  Laterality: N/A;   CARDIAC CATHETERIZATION N/A 07/18/2015   Procedure: Coronary Stent Intervention;  Surgeon: Lyn Records, MD;  Location: [facility] INVASIVE CV LAB;  Service: Cardiovascular;  Laterality: N/A;   CARDIAC CATHETERIZATION N/A 07/22/2015   Procedure: Coronary Stent Intervention;  Surgeon: Lyn Records, MD;  Location: [facility] INVASIVE CV LAB;  Service: Cardiovascular;  Laterality: N/A;   COLONOSCOPY  2009   COLONOSCOPY N/A 08/04/2019   Procedure: COLONOSCOPY;  Surgeon: West Bali, MD;  Location: [facility];  Service: Endoscopy;  Laterality: N/A;  12:30   ESOPHAGOGASTRODUODENOSCOPY N/A 05/04/2014   Procedure: ESOPHAGOGASTRODUODENOSCOPY (EGD);  Surgeon: West Bali,  MD;  Location: [facility];  Service: Endoscopy;  Laterality: N/A;  9:15   POLYPECTOMY  08/04/2019   Procedure: POLYPECTOMY;  Surgeon: West Bali, MD;  Location: [facility];  Service: Endoscopy;;  hepatic flexure, ascending colon, transverse colon, splenic flexure, descending colon    POSTERIOR LAMINECTOMY / DECOMPRESSION LUMBAR SPINE  06/03/2015   SAVORY DILATION N/A 05/04/2014   Procedure: SAVORY DILATION;  Surgeon: West Bali, MD;  Location: [facility];  Service: Endoscopy;   Laterality: N/A;    Current Outpatient Medications  Medication Sig Dispense Refill   albuterol (VENTOLIN HFA) 108 (90 Base) MCG/ACT inhaler INHALE 2 PUFFS INTO THE LUNGS EVERY 6 HOURS AS NEEDED FOR WHEEZING OR SHORTNESS OF BREATH 8.5 g 1   amLODipine (NORVASC) 2.5 MG tablet TAKE 1 TABLET(2.5 MG) BY MOUTH DAILY 90 tablet 0   Ascorbic Acid (VITAMIN C PO) Take 4 tablets by mouth daily. Takes 4000 mg daily     atorvastatin (LIPITOR) 80 MG tablet TAKE 1 TABLET BY MOUTH DAILY 90 tablet 1   benazepril (LOTENSIN) 20 MG tablet TAKE 1 TABLET(20 MG) BY MOUTH DAILY 30 tablet 0   Cholecalciferol 25 MCG (1000 UT) tablet Take by mouth daily.     clopidogrel (PLAVIX) 75 MG tablet TAKE 1 TABLET(75 MG) BY MOUTH DAILY 90 tablet 0   Cyanocobalamin (VITAMIN B-12 CR) 1000 MCG TBCR Take 1 tablet by mouth daily.     dorzolamide-timolol (COSOPT) 22.3-6.8 MG/ML ophthalmic solution SMARTSIG:1 Drop(s) In Eye(s) Every 12 Hours     fish oil-omega-3 fatty acids 1000 MG capsule Take 2 g by mouth daily.     fluticasone (FLONASE) 50 MCG/ACT nasal spray SHAKE LIQUID AND USE 1 SPRAY IN EACH NOSTRIL DAILY AS NEEDED FOR ALLERGIES OR RHINITIS 16 g 1   Iron, Ferrous Sulfate, 325 (65 Fe) MG TABS Take 325 mg by mouth every other day. 45 tablet 1   JARDIANCE 10 MG TABS tablet TAKE 1 TABLET(10 MG) BY MOUTH DAILY 90 tablet 1   metoprolol succinate (TOPROL XL) 25 MG 24 hr tablet Take 1 tablet (25 mg total) by mouth daily. 90 tablet 3   nitroGLYCERIN (NITROSTAT) 0.4 MG SL tablet Place 1 tablet (0.4 mg total) under the tongue every 5 (five) minutes as needed for chest pain (maxium 3 tablets daily). Do not take within 36 hrs of Cialis use 25 tablet 2   ofloxacin (OCUFLOX) 0.3 % ophthalmic solution      OVER THE COUNTER MEDICATION IMMODIUM     pantoprazole (PROTONIX) 40 MG tablet TAKE 1 TABLET(40 MG) BY MOUTH EVERY MORNING 90 tablet 0   potassium chloride SA (KLOR-CON M) 20 MEQ tablet TAKE 1 TABLET(20 MEQ) BY MOUTH DAILY 90 tablet 0   Zinc  Acetate 50 MG CAPS Take 50 mg by mouth daily.      ondansetron (ZOFRAN-ODT) 4 MG disintegrating tablet Take 1 tablet (4 mg total) by mouth every 8 (eight) hours as needed for nausea or vomiting. (Patient not taking: Reported on 08/18/2023) 20 tablet 0   No current facility-administered medications for this visit.    Allergies as of 08/18/2023   (No Known Allergies)    Family History  Problem Relation Age of Onset   Hypertension Mother    Hypertension Father    Heart attack Father    Stroke Father    Colon cancer Neg Hx    Colon polyps Neg Hx     Social History   Socioeconomic History   Marital status:  Divorced    Spouse name: Not on file   Number of children: 3   Years of education: Not on file   Highest education level: Not on file  Occupational History   Occupation: Sales  Tobacco Use   Smoking status: Former    Current packs/day: 0.00    Average packs/day: 2.0 packs/day for 24.2 years (48.3 ttl pk-yrs)    Types: Cigarettes    Start date: 78    Quit date: 12/24/1985    Years since quitting: 37.6   Smokeless tobacco: Never  Vaping Use   Vaping status: Never Used  Substance and Sexual Activity   Alcohol use: Yes    Alcohol/week: 6.0 standard drinks of alcohol    Types: 2 Glasses of wine, 4 Cans of beer per week   Drug use: No   Sexual activity: Yes  Other Topics Concern   Not on file  Social History Narrative   3 daughters, 2 live locally, one lives in [place].   3 grandsons   Active in grandson's travel ball. Works part time for [employer].    Social Determinants of Health   Financial Resource Strain: Low Risk  (11/13/2022)   Overall Financial Resource Strain (CARDIA)    Difficulty of Paying Living Expenses: Not hard at all  Food Insecurity: No Food Insecurity (11/13/2022)   Hunger Vital Sign    Worried About Running Out of Food in the Last Year: Never true    Ran Out of Food in the Last Year: Never true  Transportation Needs: No  Transportation Needs (11/13/2022)   PRAPARE - Administrator, Civil Service (Medical): No    Lack of Transportation (Non-Medical): No  Physical Activity: Sufficiently Active (11/13/2022)   Exercise Vital Sign    Days of Exercise per Week: 5 days    Minutes of Exercise per Session: 60 min  Stress: No Stress Concern Present (11/13/2022)   [employer] of Occupational Health - Occupational Stress Questionnaire    Feeling of Stress : Not at all  Social Connections: Moderately Integrated (11/13/2022)   Social Connection and Isolation Panel [NHANES]    Frequency of Communication with Friends and Family: More than three times a week    Frequency of Social Gatherings with Friends and Family: Three times a week    Attends Religious Services: More than 4 times per year    Active Member of Clubs or Organizations: Yes    Attends Banker Meetings: More than 4 times per year    Marital Status: Divorced  Intimate Partner Violence: Not At Risk (11/13/2022)   Humiliation, Afraid, Rape, and Kick questionnaire    Fear of Current or Ex-Partner: No    Emotionally Abused: No    Physically Abused: No    Sexually Abused: No    Subjective: Review of Systems  Constitutional:  Negative for chills and fever.  HENT:  Negative for congestion and hearing loss.   Eyes:  Negative for blurred vision and double vision.  Respiratory:  Negative for cough and shortness of breath.   Cardiovascular:  Negative for chest pain and palpitations.  Gastrointestinal:  Positive for abdominal pain, diarrhea and nausea. Negative for blood in stool, constipation, heartburn, melena and vomiting.  Genitourinary:  Negative for dysuria and urgency.  Musculoskeletal:  Negative for joint pain and myalgias.  Skin:  Negative for itching and rash.  Neurological:  Negative for dizziness and headaches.  Psychiatric/Behavioral:  Negative for depression. The patient is not nervous/anxious.  Objective: BP  138/76 (BP Location: Right Arm, Patient Position: Sitting, Cuff Size: Large)   Pulse 73   Temp (!) 97.5 F (36.4 C) (Temporal)   Ht 6\' 2"  (1.88 m)   Wt 234 lb 9.6 oz (106.4 kg)   BMI 30.12 kg/m  Physical Exam Constitutional:      Appearance: Normal appearance.  HENT:     Head: Normocephalic and atraumatic.  Eyes:     Extraocular Movements: Extraocular movements intact.     Conjunctiva/sclera: Conjunctivae normal.  Cardiovascular:     Rate and Rhythm: Normal rate and regular rhythm.  Pulmonary:     Effort: Pulmonary effort is normal.     Breath sounds: Normal breath sounds.  Abdominal:     General: Bowel sounds are normal.     Palpations: Abdomen is soft.  Musculoskeletal:        General: Normal range of motion.     Cervical back: Normal range of motion and neck supple.  Skin:    General: Skin is warm.  Neurological:     General: No focal deficit present.     Mental Status: He is alert and oriented to person, place, and time.  Psychiatric:        Mood and Affect: Mood normal.        Behavior: Behavior normal.      Assessment: *Nausea vomiting *Diarrhea/fecal urgency *Abdominal pain *Chronic GERD  Plan: Unclear etiology of symptoms.  Possible infectious gastroenteritis which is slowly resolving.  Given improvement in his symptoms, I think we can continue to monitor for now.  If he has resurgence of his diarrhea, abdominal pain, and/or nausea vomiting, counseled patient to call our office and we can consider CT abdomen pelvis to further evaluate.  Ondansetron refilled today to take as needed if his nausea returns.  I have also sent in refills of his pantoprazole.   Thank you Dr. Adriana Simas for the kind referral.  08/18/2023 1:32 PM   Disclaimer: This note was dictated with voice recognition software. Similar sounding words can inadvertently be transcribed and may not be corrected upon review.

## 2023-08-18 NOTE — Patient Instructions (Signed)
I am happy to hear that you are feeling better.  You may have had a bad infectious gastroenteritis which is slowly resolving.  We will continue to monitor for now.  I will send in refills of your ondansetron to take as needed for nausea.  I have also sent in refills of your pantoprazole for your chronic acid reflux.  If you have resurgence of your symptoms then call our office and let us know and we will order a CT of your abdomen pelvis to further evaluate.  It was very nice meeting you today.  Dr. Marletta Lor

## 2023-08-23 ENCOUNTER — Other Ambulatory Visit: Payer: Self-pay | Admitting: Family Medicine

## 2023-08-25 ENCOUNTER — Ambulatory Visit: Payer: Medicare Other | Attending: Cardiovascular Disease | Admitting: Cardiovascular Disease

## 2023-08-25 ENCOUNTER — Encounter: Payer: Self-pay | Admitting: Cardiovascular Disease

## 2023-08-25 VITALS — BP 134/80 | HR 50 | Ht 74.0 in | Wt 235.2 lb

## 2023-08-25 DIAGNOSIS — I2510 Atherosclerotic heart disease of native coronary artery without angina pectoris: Secondary | ICD-10-CM | POA: Diagnosis not present

## 2023-08-25 DIAGNOSIS — R0609 Other forms of dyspnea: Secondary | ICD-10-CM | POA: Diagnosis not present

## 2023-08-25 DIAGNOSIS — Z9861 Coronary angioplasty status: Secondary | ICD-10-CM

## 2023-08-25 DIAGNOSIS — E785 Hyperlipidemia, unspecified: Secondary | ICD-10-CM

## 2023-08-25 NOTE — Progress Notes (Signed)
Cardiology Office Note:  .   Date:  08/25/2023  ID:  Mitchell Oliver, DOB [birth year], MRN [id number] PCP: Mitchell Sams, DO  [facility] Providers Cardiologist:  Mitchell Noe, MD (Inactive)    History of Present Illness: .    Oct. 30, 2024  Mitchell Oliver is a 77 y.o. male , previous patient of Dr. Linford Oliver is seen today for the first time    Hx of CAD, s/p  NSTEMI in 2016 with DES to the PDA and staged PCI to the diag, CVA, GERD, HTN, HLD , COPD   Doing fairly well  Has developed some dyspnea  over the past several weeks  Has DOE , while walking   Echocardiogram from 2016 reveals normal left ventricular systolic function.  He has grade 1 diastolic dysfunction.  He denies any chest discomfort.  He denies any angina.  Still working  [employer] buildings in [place]    ROS:   Studies Reviewed: Marland Kitchen   EKG Interpretation Date/Time:  Wednesday August 25 2023 09:48:51 EDT Ventricular Rate:  53 PR Interval:  204 QRS Duration:  84 QT Interval:  438 QTC Calculation: 410 R Axis:   50  Text Interpretation: Sinus bradycardia When compared with ECG of 23-Jul-2015 03:58, Criteria for Anterior infarct are no longer Present T wave inversion now evident in Inferior leads Nonspecific T wave abnormality no longer evident in Anterior leads Confirmed by Kristeen Miss ([id number]) on 08/25/2023 9:53:30 AM    EKG Interpretation Date/Time:  Wednesday August 25 2023 09:48:51 EDT Ventricular Rate:  53 PR Interval:  204 QRS Duration:  84 QT Interval:  438 QTC Calculation: 410 R Axis:   50  Text Interpretation: Sinus bradycardia When compared with ECG of 23-Jul-2015 03:58, Criteria for Anterior infarct are no longer Present T wave inversion now evident in Inferior leads Nonspecific T wave abnormality no longer evident in Anterior leads Confirmed by Kristeen Miss ([id number]) on 08/25/2023 9:53:30 AM   Risk Assessment/Calculations:             Physical Exam:   VS:  BP  134/80   Pulse (!) 50   Ht 6\' 2"  (1.88 m)   Wt 235 lb 3.2 oz (106.7 kg)   SpO2 98%   BMI 30.20 kg/m    Wt Readings from Last 3 Encounters:  08/25/23 235 lb 3.2 oz (106.7 kg)  08/18/23 234 lb 9.6 oz (106.4 kg)  08/12/23 228 lb (103.4 kg)    GEN: Well nourished, well developed in no acute distress NECK: No JVD; No carotid bruits CARDIAC: RR with occasional premature beats,  1-2/6 systolic murmur at upper left sternal border  RESPIRATORY:  Clear to auscultation without rales, wheezing or rhonchi  ABDOMEN: Soft, non-tender, non-distended EXTREMITIES:  trace - 1+ pitting edema  No deformity   ASSESSMENT AND PLAN: .   1.  Shortness of breath: Mitchell Oliver presents for further evaluation of some shortness of breath.  His symptoms are not at all similar to his previous episodes of unstable angina.  He admits to eating lots of salty foods on a daily basis.  Of asked him to reduce his salt intake.  He has a systolic murmur consistent with aortic valve disease on exam.  Will get another echocardiogram.  His previous echocardiogram was 8 years ago.  I have encouraged him to start walking on a regular basis.  I will see him again in 3 months for follow-up visit.  Dispo: 3 months   Signed, Kristeen Miss, MD

## 2023-08-25 NOTE — Patient Instructions (Signed)
Testing/Procedures: Echocardiogram Your physician has requested that you have an echocardiogram. Echocardiography is a painless test that uses sound waves to create images of your heart. It provides your doctor with information about the size and shape of your heart and how well your heart's chambers and valves are working. This procedure takes approximately one hour. There are no restrictions for this procedure. Please do NOT wear cologne, perfume, aftershave, or lotions (deodorant is allowed). Please arrive 15 minutes prior to your appointment time.  Follow-Up: At [facility], you and your health needs are our priority.  As part of our continuing mission to provide you with exceptional heart care, we have created designated Provider Care Teams.  These Care Teams include your primary Cardiologist (physician) and Advanced Practice Providers (APPs -  Physician Assistants and Nurse Practitioners) who all work together to provide you with the care you need, when you need it.  Your next appointment:   3 month(s)  Provider:   Kristeen Miss, MD

## 2023-09-04 ENCOUNTER — Other Ambulatory Visit: Payer: Self-pay | Admitting: Nurse Practitioner

## 2023-09-08 ENCOUNTER — Other Ambulatory Visit: Payer: Self-pay | Admitting: Family Medicine

## 2023-09-22 ENCOUNTER — Ambulatory Visit (HOSPITAL_COMMUNITY): Payer: Medicare Other | Attending: Cardiovascular Disease

## 2023-09-22 DIAGNOSIS — I2510 Atherosclerotic heart disease of native coronary artery without angina pectoris: Secondary | ICD-10-CM

## 2023-09-22 DIAGNOSIS — E785 Hyperlipidemia, unspecified: Secondary | ICD-10-CM

## 2023-09-22 DIAGNOSIS — R0609 Other forms of dyspnea: Secondary | ICD-10-CM

## 2023-09-22 DIAGNOSIS — Z9861 Coronary angioplasty status: Secondary | ICD-10-CM | POA: Diagnosis present

## 2023-09-22 LAB — ECHOCARDIOGRAM COMPLETE
AR max vel: 2.43 cm2
AV Area VTI: 2.46 cm2
AV Area mean vel: 2.31 cm2
AV Mean grad: 9.3 mm[Hg]
AV Peak grad: 17.5 mm[Hg]
Ao pk vel: 2.09 m/s
Area-P 1/2: 3.31 cm2
S' Lateral: 3.1 cm

## 2023-09-27 ENCOUNTER — Telehealth: Payer: Self-pay | Admitting: Cardiovascular Disease

## 2023-09-27 NOTE — Telephone Encounter (Signed)
Patient called again to follow-up on echocardiogram test results and next steps.

## 2023-09-27 NOTE — Telephone Encounter (Signed)
Normal LV systolic function with EF 55-60%.  Normal diastolic function Mild MR The AV has a mean gradient of 9.3 mmHg ( minimal AS).  This minimal aortic stenosis is likely the cause of his systolic murmur No Aortic insufficiency Mild pulmonic insufficiency Mild dilatation of the aortic root No significant cardiac structural abnormalities to explain his dyspnea    Called and spoke with patient and gave above information to him. Advised that he reach out to PCP or pulmonologist for next steps regarding his shortness of breath.

## 2023-09-27 NOTE — Telephone Encounter (Signed)
Patient would like a call back to discuss echo results. 

## 2023-09-28 ENCOUNTER — Other Ambulatory Visit: Payer: Self-pay | Admitting: Family Medicine

## 2023-10-02 ENCOUNTER — Other Ambulatory Visit: Payer: Self-pay | Admitting: Family Medicine

## 2023-10-04 ENCOUNTER — Inpatient Hospital Stay (HOSPITAL_COMMUNITY)
Admission: EM | Admit: 2023-10-04 | Discharge: 2023-10-07 | DRG: 871 | Disposition: A | Discharge status: Home / Self Care | Payer: Medicare Other | Attending: Internal Medicine | Admitting: Internal Medicine

## 2023-10-04 ENCOUNTER — Emergency Department (HOSPITAL_COMMUNITY): Payer: Medicare Other

## 2023-10-04 ENCOUNTER — Other Ambulatory Visit: Payer: Self-pay

## 2023-10-04 ENCOUNTER — Inpatient Hospital Stay (HOSPITAL_COMMUNITY): Payer: Medicare Other

## 2023-10-04 ENCOUNTER — Encounter (HOSPITAL_COMMUNITY): Payer: Self-pay | Admitting: *Deleted

## 2023-10-04 DIAGNOSIS — Z884 Allergy status to anesthetic agent status: Secondary | ICD-10-CM

## 2023-10-04 DIAGNOSIS — R188 Other ascites: Secondary | ICD-10-CM | POA: Diagnosis present

## 2023-10-04 DIAGNOSIS — G8929 Other chronic pain: Secondary | ICD-10-CM | POA: Diagnosis present

## 2023-10-04 DIAGNOSIS — K766 Portal hypertension: Secondary | ICD-10-CM | POA: Diagnosis present

## 2023-10-04 DIAGNOSIS — Z9049 Acquired absence of other specified parts of digestive tract: Secondary | ICD-10-CM

## 2023-10-04 DIAGNOSIS — R7989 Other specified abnormal findings of blood chemistry: Secondary | ICD-10-CM | POA: Diagnosis not present

## 2023-10-04 DIAGNOSIS — R634 Abnormal weight loss: Secondary | ICD-10-CM | POA: Diagnosis present

## 2023-10-04 DIAGNOSIS — R768 Other specified abnormal immunological findings in serum: Secondary | ICD-10-CM | POA: Diagnosis present

## 2023-10-04 DIAGNOSIS — N179 Acute kidney failure, unspecified: Secondary | ICD-10-CM | POA: Diagnosis present

## 2023-10-04 DIAGNOSIS — A419 Sepsis, unspecified organism: Secondary | ICD-10-CM | POA: Diagnosis present

## 2023-10-04 DIAGNOSIS — R748 Abnormal levels of other serum enzymes: Secondary | ICD-10-CM | POA: Diagnosis not present

## 2023-10-04 DIAGNOSIS — Z955 Presence of coronary angioplasty implant and graft: Secondary | ICD-10-CM

## 2023-10-04 DIAGNOSIS — R7401 Elevation of levels of liver transaminase levels: Secondary | ICD-10-CM

## 2023-10-04 DIAGNOSIS — J449 Chronic obstructive pulmonary disease, unspecified: Secondary | ICD-10-CM | POA: Diagnosis present

## 2023-10-04 DIAGNOSIS — Z6830 Body mass index (BMI) 30.0-30.9, adult: Secondary | ICD-10-CM | POA: Diagnosis not present

## 2023-10-04 DIAGNOSIS — J439 Emphysema, unspecified: Secondary | ICD-10-CM | POA: Diagnosis present

## 2023-10-04 DIAGNOSIS — K219 Gastro-esophageal reflux disease without esophagitis: Secondary | ICD-10-CM | POA: Diagnosis present

## 2023-10-04 DIAGNOSIS — I2510 Atherosclerotic heart disease of native coronary artery without angina pectoris: Secondary | ICD-10-CM

## 2023-10-04 DIAGNOSIS — Z8249 Family history of ischemic heart disease and other diseases of the circulatory system: Secondary | ICD-10-CM | POA: Diagnosis not present

## 2023-10-04 DIAGNOSIS — A4159 Other Gram-negative sepsis: Principal | ICD-10-CM | POA: Diagnosis present

## 2023-10-04 DIAGNOSIS — E7800 Pure hypercholesterolemia, unspecified: Secondary | ICD-10-CM | POA: Diagnosis present

## 2023-10-04 DIAGNOSIS — K8021 Calculus of gallbladder without cholecystitis with obstruction: Secondary | ICD-10-CM | POA: Diagnosis present

## 2023-10-04 DIAGNOSIS — E872 Acidosis, unspecified: Secondary | ICD-10-CM | POA: Diagnosis present

## 2023-10-04 DIAGNOSIS — R6520 Severe sepsis without septic shock: Secondary | ICD-10-CM | POA: Diagnosis present

## 2023-10-04 DIAGNOSIS — E669 Obesity, unspecified: Secondary | ICD-10-CM | POA: Diagnosis present

## 2023-10-04 DIAGNOSIS — I10 Essential (primary) hypertension: Secondary | ICD-10-CM | POA: Diagnosis present

## 2023-10-04 DIAGNOSIS — K8689 Other specified diseases of pancreas: Secondary | ICD-10-CM

## 2023-10-04 DIAGNOSIS — Z22359 Carrier of enterobacterales, unspecified: Secondary | ICD-10-CM | POA: Diagnosis not present

## 2023-10-04 DIAGNOSIS — Z9861 Coronary angioplasty status: Secondary | ICD-10-CM | POA: Diagnosis not present

## 2023-10-04 DIAGNOSIS — I252 Old myocardial infarction: Secondary | ICD-10-CM | POA: Diagnosis not present

## 2023-10-04 DIAGNOSIS — Z87891 Personal history of nicotine dependence: Secondary | ICD-10-CM

## 2023-10-04 DIAGNOSIS — J9601 Acute respiratory failure with hypoxia: Secondary | ICD-10-CM | POA: Diagnosis present

## 2023-10-04 DIAGNOSIS — Z79899 Other long term (current) drug therapy: Secondary | ICD-10-CM

## 2023-10-04 DIAGNOSIS — Z7984 Long term (current) use of oral hypoglycemic drugs: Secondary | ICD-10-CM

## 2023-10-04 DIAGNOSIS — D72829 Elevated white blood cell count, unspecified: Secondary | ICD-10-CM | POA: Diagnosis not present

## 2023-10-04 DIAGNOSIS — E1159 Type 2 diabetes mellitus with other circulatory complications: Secondary | ICD-10-CM | POA: Diagnosis present

## 2023-10-04 DIAGNOSIS — K529 Noninfective gastroenteritis and colitis, unspecified: Secondary | ICD-10-CM | POA: Diagnosis present

## 2023-10-04 DIAGNOSIS — Z7902 Long term (current) use of antithrombotics/antiplatelets: Secondary | ICD-10-CM

## 2023-10-04 DIAGNOSIS — A4150 Gram-negative sepsis, unspecified: Secondary | ICD-10-CM | POA: Diagnosis not present

## 2023-10-04 DIAGNOSIS — Z1152 Encounter for screening for COVID-19: Secondary | ICD-10-CM | POA: Diagnosis not present

## 2023-10-04 DIAGNOSIS — Z8673 Personal history of transient ischemic attack (TIA), and cerebral infarction without residual deficits: Secondary | ICD-10-CM

## 2023-10-04 DIAGNOSIS — Z860100 Personal history of colon polyps, unspecified: Secondary | ICD-10-CM

## 2023-10-04 DIAGNOSIS — A09 Infectious gastroenteritis and colitis, unspecified: Secondary | ICD-10-CM | POA: Diagnosis not present

## 2023-10-04 DIAGNOSIS — K7460 Unspecified cirrhosis of liver: Secondary | ICD-10-CM | POA: Diagnosis present

## 2023-10-04 DIAGNOSIS — Z8719 Personal history of other diseases of the digestive system: Secondary | ICD-10-CM

## 2023-10-04 DIAGNOSIS — Z85820 Personal history of malignant melanoma of skin: Secondary | ICD-10-CM

## 2023-10-04 DIAGNOSIS — R0902 Hypoxemia: Secondary | ICD-10-CM | POA: Diagnosis not present

## 2023-10-04 DIAGNOSIS — R1011 Right upper quadrant pain: Principal | ICD-10-CM

## 2023-10-04 LAB — CBG MONITORING, ED: Glucose-Capillary: 124 mg/dL — ABNORMAL HIGH (ref 70–99)

## 2023-10-04 LAB — IRON AND TIBC
Iron: 32 ug/dL — ABNORMAL LOW (ref 45–182)
Saturation Ratios: 13 % — ABNORMAL LOW (ref 17.9–39.5)
TIBC: 250 ug/dL (ref 250–450)
UIBC: 218 ug/dL

## 2023-10-04 LAB — URINALYSIS, ROUTINE W REFLEX MICROSCOPIC
Bacteria, UA: NONE SEEN
Bilirubin Urine: NEGATIVE
Glucose, UA: 150 mg/dL — AB
Hgb urine dipstick: NEGATIVE
Ketones, ur: NEGATIVE mg/dL
Leukocytes,Ua: NEGATIVE
Nitrite: NEGATIVE
Protein, ur: 100 mg/dL — AB
Specific Gravity, Urine: >1.046 — ABNORMAL HIGH (ref 1.005–1.030)
pH: 6 (ref 5.0–8.0)

## 2023-10-04 LAB — LACTIC ACID, PLASMA
Lactic Acid, Venous: 3.7 mmol/L (ref 0.5–1.9)
Lactic Acid, Venous: 3.1 mmol/L (ref 0.5–1.9)

## 2023-10-04 LAB — CBC
HCT: 53.7 % — ABNORMAL HIGH (ref 39.0–52.0)
Hemoglobin: 17.3 g/dL — ABNORMAL HIGH (ref 13.0–17.0)
MCH: 30.6 pg (ref 26.0–34.0)
MCHC: 32.2 g/dL (ref 30.0–36.0)
MCV: 95 fL (ref 80.0–100.0)
Platelets: 158 10*3/uL (ref 150–400)
RBC: 5.65 MIL/uL (ref 4.22–5.81)
RDW: 14.6 % (ref 11.5–15.5)
WBC: 18.4 10*3/uL — ABNORMAL HIGH (ref 4.0–10.5)
nRBC: 0 % (ref 0.0–0.2)

## 2023-10-04 LAB — PROCALCITONIN: Procalcitonin: 16.57 ng/mL

## 2023-10-04 LAB — COMPREHENSIVE METABOLIC PANEL
ALT: 120 U/L — ABNORMAL HIGH (ref 0–44)
AST: 194 U/L — ABNORMAL HIGH (ref 15–41)
Albumin: 2.8 g/dL — ABNORMAL LOW (ref 3.5–5.0)
Alkaline Phosphatase: 94 U/L (ref 38–126)
Anion gap: 13 (ref 5–15)
BUN: 9 mg/dL (ref 8–23)
CO2: 18 mmol/L — ABNORMAL LOW (ref 22–32)
Calcium: 9.5 mg/dL (ref 8.9–10.3)
Chloride: 106 mmol/L (ref 98–111)
Creatinine, Ser: 1.49 mg/dL — ABNORMAL HIGH (ref 0.61–1.24)
GFR, Estimated: 47 mL/min — ABNORMAL LOW (ref >60)
Glucose, Bld: 95 mg/dL (ref 70–99)
Potassium: 4.5 mmol/L (ref 3.5–5.1)
Sodium: 137 mmol/L (ref 135–145)
Total Bilirubin: 4.6 mg/dL — ABNORMAL HIGH (ref <1.2)
Total Protein: 6.1 g/dL — ABNORMAL LOW (ref 6.5–8.1)

## 2023-10-04 LAB — SARS CORONAVIRUS 2 BY RT PCR: SARS Coronavirus 2 by RT PCR: NEGATIVE

## 2023-10-04 LAB — PROTIME-INR
INR: 1.6 — ABNORMAL HIGH (ref 0.8–1.2)
Prothrombin Time: 19.1 s — ABNORMAL HIGH (ref 11.4–15.2)

## 2023-10-04 LAB — BRAIN NATRIURETIC PEPTIDE: B Natriuretic Peptide: 1308 pg/mL — ABNORMAL HIGH (ref 0.0–100.0)

## 2023-10-04 LAB — TROPONIN I (HIGH SENSITIVITY)
Troponin I (High Sensitivity): 9 ng/L (ref <18)
Troponin I (High Sensitivity): 8 ng/L (ref <18)

## 2023-10-04 LAB — LIPASE, BLOOD: Lipase: 37 U/L (ref 11–51)

## 2023-10-04 LAB — FERRITIN: Ferritin: 127 ng/mL (ref 24–336)

## 2023-10-04 MED ORDER — ONDANSETRON HCL 4 MG/2ML IJ SOLN
4.0000 mg | Freq: Once | INTRAMUSCULAR | Status: AC | PRN
  Administered 2023-10-04: 4 mg via INTRAVENOUS

## 2023-10-04 MED ORDER — FENTANYL CITRATE PF 50 MCG/ML IJ SOSY
25.0000 ug | PREFILLED_SYRINGE | Freq: Once | INTRAMUSCULAR | Status: AC
  Administered 2023-10-04: 25 ug via INTRAVENOUS
  Filled 2023-10-04: qty 1

## 2023-10-04 MED ORDER — SODIUM CHLORIDE 0.9 % IV SOLN: INTRAVENOUS | Status: AC

## 2023-10-04 MED ORDER — LIDOCAINE 5 % EX PTCH
1.0000 | MEDICATED_PATCH | CUTANEOUS | Status: DC
  Filled 2023-10-04: qty 1

## 2023-10-04 MED ORDER — IPRATROPIUM-ALBUTEROL 0.5-2.5 (3) MG/3ML IN SOLN
3.0000 mL | RESPIRATORY_TRACT | Status: DC | PRN
  Administered 2023-10-05 – 2023-10-07 (×3): 3 mL via RESPIRATORY_TRACT
  Filled 2023-10-04 (×3): qty 3

## 2023-10-04 MED ORDER — SODIUM CHLORIDE 0.9 % IV BOLUS
500.0000 mL | Freq: Once | INTRAVENOUS | Status: AC
  Administered 2023-10-04: 500 mL via INTRAVENOUS

## 2023-10-04 MED ORDER — ONDANSETRON HCL 4 MG/2ML IJ SOLN
INTRAMUSCULAR | Status: AC
  Filled 2023-10-04: qty 2

## 2023-10-04 MED ORDER — IOHEXOL 300 MG/ML  SOLN
100.0000 mL | Freq: Once | INTRAMUSCULAR | Status: AC | PRN
  Administered 2023-10-04: 100 mL via INTRAVENOUS

## 2023-10-04 MED ORDER — LIDOCAINE 5 % EX PTCH: 1.0000 | MEDICATED_PATCH | CUTANEOUS | Status: DC

## 2023-10-04 MED ORDER — PIPERACILLIN-TAZOBACTAM 3.375 G IVPB
3.3750 g | Freq: Three times a day (TID) | INTRAVENOUS | Status: DC
Start: 2023-10-04 — End: 2023-10-05
  Administered 2023-10-04 – 2023-10-05 (×3): 3.375 g via INTRAVENOUS
  Filled 2023-10-04 (×3): qty 50

## 2023-10-04 MED ORDER — FLUTICASONE PROPIONATE 50 MCG/ACT NA SUSP: 1.0000 | Freq: Every day | NASAL | Status: DC | PRN

## 2023-10-04 MED ORDER — PIPERACILLIN-TAZOBACTAM 3.375 G IVPB 30 MIN
3.3750 g | Freq: Once | INTRAVENOUS | Status: AC
Start: 2023-10-04 — End: 2023-10-04
  Administered 2023-10-04: 3.375 g via INTRAVENOUS
  Filled 2023-10-04: qty 50

## 2023-10-04 MED ORDER — ONDANSETRON HCL 4 MG PO TABS
4.0000 mg | ORAL_TABLET | Freq: Four times a day (QID) | ORAL | Status: DC | PRN
  Filled 2023-10-04: qty 1

## 2023-10-04 MED ORDER — GADOBUTROL 1 MMOL/ML IV SOLN
10.0000 mL | Freq: Once | INTRAVENOUS | Status: AC | PRN
  Administered 2023-10-04: 10 mL via INTRAVENOUS

## 2023-10-04 MED ORDER — SODIUM CHLORIDE 0.9 % IV SOLN
500.0000 mg | INTRAVENOUS | Status: DC
  Administered 2023-10-04: 500 mg via INTRAVENOUS
  Filled 2023-10-04: qty 5

## 2023-10-04 MED ORDER — ONDANSETRON HCL 4 MG/2ML IJ SOLN
4.0000 mg | Freq: Four times a day (QID) | INTRAMUSCULAR | Status: DC | PRN
Start: 2023-10-04 — End: 2023-10-07
  Administered 2023-10-04: 4 mg via INTRAVENOUS
  Filled 2023-10-04: qty 2

## 2023-10-04 MED ORDER — HEPARIN SODIUM (PORCINE) 5000 UNIT/ML IJ SOLN
5000.0000 [IU] | Freq: Three times a day (TID) | INTRAMUSCULAR | Status: DC
  Administered 2023-10-04 – 2023-10-05 (×2): 5000 [IU] via SUBCUTANEOUS
  Filled 2023-10-04 (×3): qty 1

## 2023-10-04 MED ORDER — LACTATED RINGERS IV BOLUS
1000.0000 mL | Freq: Once | INTRAVENOUS | Status: AC
  Administered 2023-10-04: 1000 mL via INTRAVENOUS

## 2023-10-04 MED ORDER — POLYETHYLENE GLYCOL 3350 17 G PO PACK: 17.0000 g | PACK | Freq: Every day | ORAL | Status: DC | PRN

## 2023-10-04 NOTE — ED Triage Notes (Signed)
Pt BIB [facility] for c/o abdominal pain that started this am  Last BM was Saturday  BP 130/75 P 60 O2 97%  Pt states he initially had some chest pain and he laid down and went back to sleep but when he woke back up he was having severe chest pain that has moved down to his abdomen and around to his lower back

## 2023-10-04 NOTE — Assessment & Plan Note (Signed)
Clear lung exam.  Hypoxic today.  Not on home O2. -Nebs as needed

## 2023-10-04 NOTE — Consult Note (Addendum)
Gastroenterology Consult   Referring Provider: [facility] Primary Care Physician:  [name], DO Primary Gastroenterologist:  Dr. Marletta Lor  Patient ID: Mitchell Oliver; [id number]; [birth year]   Admit date: 10/04/2023  LOS: 0 days   Date of Consultation: 10/04/2023  Reason for Consultation:  Abdominal pain, cirrhosis, enterocolitis  History of Present Illness   Mitchell Oliver is a 77 y.o. year old male with a history of prior CVA on Plavix, GERD, HTN, NSTEMI, hypercholesterolemia, COPD, last seen by our practice in late Oct 2024 to establish care again due to nausea, diarrhea, abdominal pain. Presented to the ED due to acute on chronic abdominal pain, chest pain, nausea.   In the ED: newly elevated LFTs with Tbili 4.6, AST 194, ALT 120, Hgb 17.3, WBC count 18.4, lipase 37, creatinine 1.49, blood cultures and lactic acid pending, BNP pending. 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 CXR with increasing interstitial changes of lungs.   Today: Patient notes at least month long history of intermittent abdominal pain located in upper abdomen, waxing and waning, sometimes with food aversions and concerned about diarrhea with eating. 2-3 episodes of diarrhea a day for past month or so. Woke up at 0230 this morning with new onset chest pain, which hasn't happened before. This then progressed to upper abdominal pain awakening him from his sleep. Last BM was Saturday. No BM today. He was actually constipated last week. Nausea is intermittent but no vomiting. No fever or chills. No overt GI bleeding such as melena or hematochezia. Decreased appetite noted with poor oral intake and unintentional weight loss over past year. Wife concerned about jaundice. No mental status changes or confusion.   He is feeling better currently after pain medication. IV  antibiotics have been started empirically. MRI/MRCP also has been ordered.  He does endorse 1-2 beers at most a night. History of increased beers (up to 6 pack) when he was much younger. No smoking. No FH colon cancer or polyps, liver disease, that he is aware.      EGD 05/04/2014 with distal esophageal stricture, gastritis, underwent esophageal dilation. Biopsies with chronic gastritis, focal intestinal metaplasia, negative for H. pylori.   Colonoscopy 2020 by Dr Darrick Penna: six 2-5 mm polyps, diverticulosis, torturous colon, external and internal hemorrhoids.    Past Medical History:  Diagnosis Date   Allergy    COPD (chronic obstructive pulmonary disease) (HCC)    CVA (cerebral vascular accident) (HCC) 2012   denies residual on 07/22/2015   ED (erectile dysfunction)    Family history of adverse reaction to anesthesia    "think my mother had real bad headaches after anesthesia"   GERD (gastroesophageal reflux disease)    Hypercholesterolemia    Hypertension    IFG (impaired fasting glucose)    NSTEMI (non-ST elevated myocardial infarction) (HCC)    Pneumonia ~ 2005    Past Surgical History:  Procedure Laterality Date   APPENDECTOMY  2009 [facility]   BACK SURGERY     BIOPSY  05/04/2014   Procedure: BIOPSY;  Surgeon: West Bali, MD;  Location: [facility];  Service: Endoscopy;;   CARDIAC CATHETERIZATION N/A 07/18/2015   Procedure: Left Heart Cath and Coronary Angiography;  Surgeon: Lyn Records, MD;  Location: [facility] INVASIVE CV LAB;  Service: Cardiovascular;  Laterality: N/A;   CARDIAC CATHETERIZATION N/A 07/18/2015   Procedure: Coronary Stent Intervention;  Surgeon: Lyn Records,  MD;  Location: [facility] INVASIVE CV LAB;  Service: Cardiovascular;  Laterality: N/A;   CARDIAC CATHETERIZATION N/A 07/22/2015   Procedure: Coronary Stent Intervention;  Surgeon: Lyn Records, MD;  Location: [facility] INVASIVE CV LAB;  Service: Cardiovascular;  Laterality: N/A;   COLONOSCOPY  2009   COLONOSCOPY N/A  08/04/2019   Procedure: COLONOSCOPY;  Surgeon: West Bali, MD;  Location: [facility];  Service: Endoscopy;  Laterality: N/A;  12:30   ESOPHAGOGASTRODUODENOSCOPY N/A 05/04/2014   Procedure: ESOPHAGOGASTRODUODENOSCOPY (EGD);  Surgeon: West Bali, MD;  Location: [facility];  Service: Endoscopy;  Laterality: N/A;  9:15   POLYPECTOMY  08/04/2019   Procedure: POLYPECTOMY;  Surgeon: West Bali, MD;  Location: [facility];  Service: Endoscopy;;  hepatic flexure, ascending colon, transverse colon, splenic flexure, descending colon    POSTERIOR LAMINECTOMY / DECOMPRESSION LUMBAR SPINE  06/03/2015   SAVORY DILATION N/A 05/04/2014   Procedure: SAVORY DILATION;  Surgeon: West Bali, MD;  Location: [facility];  Service: Endoscopy;  Laterality: N/A;    Prior to Admission medications   Medication Sig Start Date End Date Taking? Authorizing Provider  potassium chloride SA (KLOR-CON M) 20 MEQ tablet TAKE 1 TABLET(20 MEQ) BY MOUTH DAILY 09/29/23   Everlene Other G, DO  tiZANidine (ZANAFLEX) 2 MG tablet Take 2 mg by mouth 2 (two) times daily as needed. 09/20/23  Yes [provider]  albuterol (VENTOLIN HFA) 108 (90 Base) MCG/ACT inhaler INHALE 2 PUFFS INTO THE LUNGS EVERY 6 HOURS AS NEEDED FOR WHEEZING OR SHORTNESS OF BREATH 07/13/23   Everlene Other G, DO  amLODipine (NORVASC) 2.5 MG tablet TAKE 1 TABLET(2.5 MG) BY MOUTH DAILY 09/07/23   Nahser, Deloris Ping, MD  Ascorbic Acid (VITAMIN C PO) Take 4 tablets by mouth daily. Takes 4000 mg daily    [provider]  atorvastatin (LIPITOR) 80 MG tablet TAKE 1 TABLET BY MOUTH DAILY 09/08/23   Cook, Glasgow G, DO  benazepril (LOTENSIN) 20 MG tablet TAKE 1 TABLET(20 MG) BY MOUTH DAILY 08/24/23   [name], DO  Cholecalciferol 25 MCG (1000 UT) tablet Take by mouth daily.    [provider]  clopidogrel (PLAVIX) 75 MG tablet TAKE 1 TABLET(75 MG) BY MOUTH DAILY 07/12/23   Everlene Other G, DO  Cyanocobalamin (VITAMIN B-12 CR) 1000 MCG  TBCR Take 1 tablet by mouth daily.    [provider]  dorzolamide-timolol (COSOPT) 22.3-6.8 MG/ML ophthalmic solution SMARTSIG:1 Drop(s) In Eye(s) Every 12 Hours 03/28/21   [provider]  fish oil-omega-3 fatty acids 1000 MG capsule Take 2 g by mouth daily.    [provider]  fluticasone (FLONASE) 50 MCG/ACT nasal spray SHAKE LIQUID AND USE 1 SPRAY IN EACH NOSTRIL DAILY AS NEEDED FOR ALLERGIES OR RHINITIS 04/23/23   [name], DO  Iron, Ferrous Sulfate, 325 (65 Fe) MG TABS Take 325 mg by mouth every other day. 08/25/22   Cook, Jayce G, DO  JARDIANCE 10 MG TABS tablet TAKE 1 TABLET(10 MG) BY MOUTH DAILY 11/19/22   Cook, Jayce G, DO  latanoprost (XALATAN) 0.005 % ophthalmic solution Place into both eyes at bedtime. 08/21/23   [provider]  metoprolol succinate (TOPROL XL) 25 MG 24 hr tablet Take 1 tablet (25 mg total) by mouth daily. 07/23/22   Gaston Islam., NP  nitroGLYCERIN (NITROSTAT) 0.4 MG SL tablet Place 1 tablet (0.4 mg total) under the tongue every 5 (five) minutes as needed for chest pain (  maxium 3 tablets daily). Do not take within 36 hrs of Cialis use 12/21/16   Lyn Records, MD  ofloxacin (OCUFLOX) 0.3 % ophthalmic solution  07/22/22   [provider]  ondansetron (ZOFRAN-ODT) 4 MG disintegrating tablet Take 1 tablet (4 mg total) by mouth every 8 (eight) hours as needed for nausea or vomiting. 08/18/23   Lanelle Bal, DO  OVER THE COUNTER MEDICATION IMMODIUM    [provider]  pantoprazole (PROTONIX) 40 MG tablet TAKE 1 TABLET(40 MG) BY MOUTH EVERY MORNING 08/18/23   Lanelle Bal, DO  Zinc Acetate 50 MG CAPS Take 50 mg by mouth daily.     [provider]    Current Facility-Administered Medications  Medication Dose Route Frequency Provider Last Rate Last Admin   piperacillin-tazobactam (ZOSYN) IVPB 3.375 g  3.375 g Intravenous Q8H Royanne Foots, DO       Current Outpatient Medications  Medication  Sig Dispense Refill   potassium chloride SA (KLOR-CON M) 20 MEQ tablet TAKE 1 TABLET(20 MEQ) BY MOUTH DAILY 90 tablet 0   tiZANidine (ZANAFLEX) 2 MG tablet Take 2 mg by mouth 2 (two) times daily as needed.     albuterol (VENTOLIN HFA) 108 (90 Base) MCG/ACT inhaler INHALE 2 PUFFS INTO THE LUNGS EVERY 6 HOURS AS NEEDED FOR WHEEZING OR SHORTNESS OF BREATH 8.5 g 1   amLODipine (NORVASC) 2.5 MG tablet TAKE 1 TABLET(2.5 MG) BY MOUTH DAILY 90 tablet 3   Ascorbic Acid (VITAMIN C PO) Take 4 tablets by mouth daily. Takes 4000 mg daily     atorvastatin (LIPITOR) 80 MG tablet TAKE 1 TABLET BY MOUTH DAILY 90 tablet 1   benazepril (LOTENSIN) 20 MG tablet TAKE 1 TABLET(20 MG) BY MOUTH DAILY 30 tablet 2   Cholecalciferol 25 MCG (1000 UT) tablet Take by mouth daily.     clopidogrel (PLAVIX) 75 MG tablet TAKE 1 TABLET(75 MG) BY MOUTH DAILY 90 tablet 0   Cyanocobalamin (VITAMIN B-12 CR) 1000 MCG TBCR Take 1 tablet by mouth daily.     dorzolamide-timolol (COSOPT) 22.3-6.8 MG/ML ophthalmic solution SMARTSIG:1 Drop(s) In Eye(s) Every 12 Hours     fish oil-omega-3 fatty acids 1000 MG capsule Take 2 g by mouth daily.     fluticasone (FLONASE) 50 MCG/ACT nasal spray SHAKE LIQUID AND USE 1 SPRAY IN EACH NOSTRIL DAILY AS NEEDED FOR ALLERGIES OR RHINITIS 16 g 1   Iron, Ferrous Sulfate, 325 (65 Fe) MG TABS Take 325 mg by mouth every other day. 45 tablet 1   JARDIANCE 10 MG TABS tablet TAKE 1 TABLET(10 MG) BY MOUTH DAILY 90 tablet 1   latanoprost (XALATAN) 0.005 % ophthalmic solution Place into both eyes at bedtime.     metoprolol succinate (TOPROL XL) 25 MG 24 hr tablet Take 1 tablet (25 mg total) by mouth daily. 90 tablet 3   nitroGLYCERIN (NITROSTAT) 0.4 MG SL tablet Place 1 tablet (0.4 mg total) under the tongue every 5 (five) minutes as needed for chest pain (maxium 3 tablets daily). Do not take within 36 hrs of Cialis use 25 tablet 2   ofloxacin (OCUFLOX) 0.3 % ophthalmic solution      ondansetron (ZOFRAN-ODT) 4 MG  disintegrating tablet Take 1 tablet (4 mg total) by mouth every 8 (eight) hours as needed for nausea or vomiting. 30 tablet 0   OVER THE COUNTER MEDICATION IMMODIUM     pantoprazole (PROTONIX) 40 MG tablet TAKE 1 TABLET(40 MG) BY MOUTH EVERY MORNING 90 tablet 3  Zinc Acetate 50 MG CAPS Take 50 mg by mouth daily.       Allergies as of 10/04/2023   (No Known Allergies)    Family History  Problem Relation Age of Onset   Hypertension Mother    Hypertension Father    Heart attack Father    Stroke Father    Colon cancer Neg Hx    Colon polyps Neg Hx     Social History   Socioeconomic History   Marital status: Divorced    Spouse name: Not on file   Number of children: 3   Years of education: Not on file   Highest education level: Not on file  Occupational History   Occupation: Sales  Tobacco Use   Smoking status: Former    Current packs/day: 0.00    Average packs/day: 2.0 packs/day for 24.2 years (48.3 ttl pk-yrs)    Types: Cigarettes    Start date: 13    Quit date: 12/24/1985    Years since quitting: 37.8   Smokeless tobacco: Never  Vaping Use   Vaping status: Never Used  Substance and Sexual Activity   Alcohol use: Yes    Alcohol/week: 6.0 standard drinks of alcohol    Types: 2 Glasses of wine, 4 Cans of beer per week   Drug use: No   Sexual activity: Yes  Other Topics Concern   Not on file  Social History Narrative   3 daughters, 2 live locally, one lives in [place].   3 grandsons   Active in grandson's travel ball. Works part time for [employer].    Social Determinants of Health   Financial Resource Strain: Low Risk  (11/13/2022)   Overall Financial Resource Strain (CARDIA)    Difficulty of Paying Living Expenses: Not hard at all  Food Insecurity: No Food Insecurity (11/13/2022)   Hunger Vital Sign    Worried About Running Out of Food in the Last Year: Never true    Ran Out of Food in the Last Year: Never true  Transportation Needs: No  Transportation Needs (11/13/2022)   PRAPARE - Administrator, Civil Service (Medical): No    Lack of Transportation (Non-Medical): No  Physical Activity: Sufficiently Active (11/13/2022)   Exercise Vital Sign    Days of Exercise per Week: 5 days    Minutes of Exercise per Session: 60 min  Stress: No Stress Concern Present (11/13/2022)   [employer] of Occupational Health - Occupational Stress Questionnaire    Feeling of Stress : Not at all  Social Connections: Moderately Integrated (11/13/2022)   Social Connection and Isolation Panel [NHANES]    Frequency of Communication with Friends and Family: More than three times a week    Frequency of Social Gatherings with Friends and Family: Three times a week    Attends Religious Services: More than 4 times per year    Active Member of Clubs or Organizations: Yes    Attends Banker Meetings: More than 4 times per year    Marital Status: Divorced  Intimate Partner Violence: Not At Risk (11/13/2022)   Humiliation, Afraid, Rape, and Kick questionnaire    Fear of Current or Ex-Partner: No    Emotionally Abused: No    Physically Abused: No    Sexually Abused: No     Review of Systems   Gen: Denies any fever, chills, loss of appetite, change in weight or weight loss CV: Denies chest pain, heart palpitations, syncope, edema  Resp: Denies  shortness of breath with rest, cough, wheezing, coughing up blood, and pleurisy. GI: Denies vomiting blood, jaundice, and fecal incontinence.   Denies dysphagia or odynophagia. GU : Denies urinary burning, blood in urine, urinary frequency, and urinary incontinence. MS: Denies joint pain, limitation of movement, swelling, cramps, and atrophy.  Derm: Denies rash, itching, dry skin, hives. Psych: Denies depression, anxiety, memory loss, hallucinations, and confusion. Heme: Denies bruising or bleeding Neuro:  Denies any headaches, dizziness, paresthesias, shaking  Physical Exam    Vital Signs in last 24 hours: Temp:  [98.1 F (36.7 C)-98.2 F (36.8 C)] 98.1 F (36.7 C) (12/09 1537) Pulse Rate:  [54-67] 61 (12/09 1535) Resp:  [14-27] 24 (12/09 1535) BP: (116-133)/(62-76) 121/62 (12/09 1530) SpO2:  [88 %-96 %] 94 % (12/09 1535)    General:   Alert,  no acute distress, slight sallow-appearance Head:  Normocephalic and atraumatic. Ears:  hard of hearing Mouth:  No deformity or lesions, dentition normal. Lungs:  Clear throughout to auscultation.    Heart:  no murmurs Abdomen:  Soft, obese and round but non-tender or distended, no obvious HSM. Normal bowel sounds, no TTP Rectal: deferred   Msk:  Symmetrical without gross deformities. Normal posture. Extremities:  Without  edema. Neurologic:  Alert and  oriented x4. Psych:  Alert and cooperative. Normal mood and affect.  Intake/Output from previous day: No intake/output data recorded. Intake/Output this shift: No intake/output data recorded.    Labs/Studies   Recent Labs Recent Labs    10/04/23 1127  WBC 18.4*  HGB 17.3*  HCT 53.7*  PLT 158   BMET Recent Labs    10/04/23 1127  NA 137  K 4.5  CL 106  CO2 18*  GLUCOSE 95  BUN 9  CREATININE 1.49*  CALCIUM 9.5   LFT Recent Labs    10/04/23 1127  PROT 6.1*  ALBUMIN 2.8*  AST 194*  ALT 120*  ALKPHOS 94  BILITOT 4.6*     Radiology/Studies CT ABDOMEN PELVIS W CONTRAST  Result Date: 10/04/2023 CLINICAL DATA:  Acute onset abdominal pain radiating to the lower back EXAM: CT ABDOMEN AND PELVIS WITH CONTRAST TECHNIQUE: Multidetector CT imaging of the abdomen and pelvis was performed using the standard protocol following bolus administration of intravenous contrast. RADIATION DOSE REDUCTION: This exam was performed according to the departmental dose-optimization program which includes automated exposure control, adjustment of the mA and/or kV according to patient size and/or use of iterative reconstruction technique. CONTRAST:   OMNIPAQUE IOHEXOL 300 MG/ML  SOLN COMPARISON:  CT abdomen and pelvis dated 11/21/2015 FINDINGS: Lower chest: No focal consolidation or pulmonary nodule in the lung bases. No pleural effusion or pneumothorax demonstrated. Partially imaged heart size is normal. Coronary artery calcifications. Hepatobiliary: Cirrhotic morphology. Subcentimeter segment 5/6 hypodensity (2:25), too small to characterize. Mild periportal edema. No intra or extrahepatic biliary ductal dilation. Cholelithiasis. Pancreas: 4 mm hypodensity in the pancreatic body (4:51), too small to characterize. No main pancreatic ductal dilation. Spleen: Normal in size without focal abnormality. Adrenals/Urinary Tract: No adrenal nodules. No suspicious renal mass, calculi or hydronephrosis. No focal bladder wall thickening. Stomach/Bowel: Minimally dilated fluid-filled esophagus. Normal appearance of the stomach. Mild circumferential mural thickening of the ascending colon. A few mildly dilated loops of right hemi abdominal small bowel with interspersed areas of mild circumferential mural thickening. Moderate volume stool in the rectum. Colonic diverticulosis without acute diverticulitis. Appendectomy. Vascular/Lymphatic: Aortic atherosclerosis. Upper abdominal varices. 14 mm periportal lymph node (2:26). Reproductive: Prostate is unremarkable. Other: Small  volume ascites. No free air or fluid collection. A few scattered punctate calcifications along the inferior right hepatic lobe (2:34), unchanged. Musculoskeletal: No acute or abnormal lytic or blastic osseous lesions. Multilevel degenerative changes of the partially imaged thoracic and lumbar spine. Small fat-containing left inguinal hernia. IMPRESSION: 1. Mild circumferential mural thickening of the ascending colon and a few mildly dilated loops of right hemi abdominal small bowel with interspersed areas of mild circumferential mural thickening may represent infectious/inflammatory enterocolitis or  portal enterocolopathy. 2. 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 3. Cholelithiasis. 4. 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 No main ductal dilation, mass lesion, or abnormal enhancement. Follow-up imaging in 2 years can be considered with contrast-enhanced MRI/MRCP if the patient is a surgical candidate. Alternatively, attention on follow-up. 5.  Aortic Atherosclerosis (ICD10-I70.0). Electronically Signed   By: Agustin Cree M.D.   On: 10/04/2023 14:56   DG Chest Port 1 View  Result Date: 10/04/2023 CLINICAL DATA:  Chest pain EXAM: PORTABLE CHEST 1 VIEW COMPARISON:  X-ray 07/16/2015 FINDINGS: No consolidation, pneumothorax or effusion. No edema. Normal cardiopericardial silhouette. Increasing interstitial changes. Overlapping cardiac leads. Degenerative changes along the spine. IMPRESSION: Increasing interstitial changes of the lungs. Acute versus chronic. Please correlate with clinical presentation and recommend short follow-up. Electronically Signed   By: Karen Kays M.D.   On: 10/04/2023 12:20     Assessment   Mitchell Oliver is a 77 y.o. year old male  with a history of prior CVA on Plavix (last dose 10/03/23), GERD, HTN, NSTEMI, hypercholesterolemia, COPD, hard of hearing, last seen by our practice in late Oct 2024 to establish care due to nausea, diarrhea, abdominal pain and felt to have self-limiting gastroenteritis presentation. Now presenting to the ED with acutely worsening abdominal pain, new onset chest pain now resolved, nausea, and found to have acutely elevated LFTs and CT findings as above with cirrhosis, enterocolitis vs colopathy, and pancreatic lesion.    Acutely elevated LFTs: in setting of cirrhosis, previously normal. New finding of cirrhosis. No obvious obstruction on CT. Differentials broad including passage  of microlithiasis/sludge, possible elevation in setting of acute illness, etc. Will order serologies to have on file. Suspect cirrhosis due to [facility] but unable to exclude other etiologies. Will also add INR to calculate MELD. MRI/MRCP has been ordered by the ED for further characterization. CT does note a subcentimeter hypodensity too small to characterize, which can be further evaluated with ordered MRCP.  Abdominal pain: CT as noted above. Endorsing diarrhea intermittently for some time although no stools in over 24 hours; recommend stool studies if this recurs. Agree with empiric abx. No to low concern for SBP as very limited ascites and abdominal exam benign. Recommend colonoscopy as outpatient to further evaluation colon and pursue as inpatient if no improvement. Could also pursue CTA to evaluate mesenteric vasculature and ensure not dealing with chronic ischemia in light of his multiple risk factors.   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 Likely an incidental finding.   He will need ongoing cirrhosis care as outpatient, which was discussed with him at bedside today and significant other present. He is aware to abstain from alcohol going forward and the need for hepatoma screening and EGD as outpatient.     Plan / Recommendations    Stool studies if diarrhea recurs Agree with empiric IV antibiotics May  have clears, advance as tolerated Serologies ordered for acutely elevated LFTs MRI/MRCP as planned Lactic acid pending Colonoscopy as outpatient vs inpatient if no improvement Consider CTA if constellation of symptoms points to any chronic mesenteric ischemia picture: for now treat with supportive care Will need ongoing cirrhosis care as outpatient  Absolute alcohol cessation Will evaluate again in the morning EGD as outpatient Follow IPMN as outpatient     10/04/2023, 4:16 PM  Gelene Mink, PhD, ANP-BC [facility]

## 2023-10-04 NOTE — Progress Notes (Signed)
Date and time results received: 10/04/23 2143 (use smartphrase ".now" to insert current time)  Test: Lactic Acid  Critical Value: 3.1   Name of Provider Notified: Margo Aye DO  Orders Received? Or Actions Taken?: no new orders

## 2023-10-04 NOTE — Assessment & Plan Note (Signed)
Controlled.  A1c 5.8. ??  Tight control. -Hold Jardiance - Daily fasting CBG

## 2023-10-04 NOTE — ED Notes (Signed)
Pa took pt off O2 Noted that pt was 87% RA Placed pt back on 2LPM

## 2023-10-04 NOTE — Assessment & Plan Note (Signed)
Meeting severe sepsis criteria with leukocytosis of 18.4, and tachypnea respirate rate 14-26.  With evidence of endorgan dysfunction, lactic acidosis of 3.7, AKI, and hypoxia.  Source of infection-intra-abdominal pathology- enterocolitis, or liver pathology versus lung infection. -Follow-up blood cultures -IV Zosyn, add azithromycin -Trend lactic acid -1.5 L bolus, continue N/s 100cc/hr x 15hrs

## 2023-10-04 NOTE — Assessment & Plan Note (Addendum)
Soft to stable. -Hold Norvasc, and hold benazepril for contrast exposure - Pending med rec, resume metoprolol

## 2023-10-04 NOTE — Assessment & Plan Note (Signed)
Mild. Creatinine 1.49, baseline 0.9-1.1. -Hydrate, hold lisinopril

## 2023-10-04 NOTE — Assessment & Plan Note (Signed)
Presents with abdominal pain, diarrhea-that appears to have resolved, with leukocytosis, meeting severe sepsis criteria.  CT - mild circumferential mural thickening of the ascending colon, findings may represent infectious/inflammatory enterocolitis or portal enterocolopathy (Pls see detailed report). -IV Zosyn started in ED, will continue, add azithromycin for possible lung infection

## 2023-10-04 NOTE — Progress Notes (Signed)
Pharmacy Antibiotic Note  Mitchell Oliver is a 77 y.o. male admitted on 10/04/2023 with  intra-abdominal infection .  Pharmacy has been consulted for Zosyn dosing.  Plan: Zosyn 3.375g IV q8h (4 hour infusion).     Temp (24hrs), Avg:98.2 F (36.8 C), Min:98.1 F (36.7 C), Max:98.2 F (36.8 C)  Recent Labs  Lab 10/04/23 1127  WBC 18.4*  CREATININE 1.49*    CrCl cannot be calculated (Unknown ideal weight.).    No Known Allergies  Antimicrobials this admission: Zosyn 12/9 >>  Microbiology results: 12/9 BCx: pending   Thank you for allowing pharmacy to be a part of this patient's care.  Tad Moore 10/04/2023 3:40 PM

## 2023-10-04 NOTE — ED Notes (Signed)
Pt walked to restroom with 1 person assist Pt not steady on feet UA collected and sent to lab O2 sats 87-88% on 2LPM Increased in 4LPM, pt now 96%  Pt complains of back pain  Increased WBC, decreased sat and increased RR, verbal order to order lactic and cultures on pt

## 2023-10-04 NOTE — ED Notes (Signed)
SWAT at bedside to take pt upstairs Called 300 no room assigned yet

## 2023-10-04 NOTE — ED Notes (Signed)
Introduced self to pt and reattached to full monitor Noted that pt was 87% RA Placed pt on 2LPM OS via   Pt stated CP is 6/10 currently Pt stated that CP started last night and radiates into his ABD Pt waiting for CT and aware that UA is requested Daughter at bedside, call bell on stretcher

## 2023-10-04 NOTE — ED Notes (Signed)
Per GI at bedside pt can have water and ice chips

## 2023-10-04 NOTE — H&P (Signed)
History and Physical    Mitchell Oliver UEA:[id number] DOB: April 14, 1946 DOA: 10/04/2023  PCP: [name], DO   Patient coming from: Home  I have personally briefly reviewed patient's old medical records in [facility]  Chief Complaint: Abdominal Pain, Chest pain  HPI: Mitchell Oliver is a 77 y.o. male with medical history significant for COPD, coronary artery disease, hypertension, CVA, diabetes mellitus, NSTEMI. Patient presented to the ED with complaints of chest pain abdominal pain and back pain.  Reports over the past month he has had abdominal pain across his abdomen, with diarrhea 2-3 times daily.  Diarrhea resolved about a week ago.  His last bowel movement was 2 days ago.  He also reports sharp chest pains across his lower chest, radiating into his abdomen.  No fevers no chills. Reports increasing difficulty breathing over the past 2 months, he has chronic unchanged cough related to his allergies, and postnasal drip.  ED Course: Temperature 98.2.  Heart rate 50s to 60s.  Respiratory rate 14-26.  Blood pressure systolic 116-133.  O2 sats down to 88% on room air, currently on 2 L sats 92 to 94%. Elevated liver enzymes AST 194, ALT 120, total bilirubin 4.9.  With normal ALP of 94. Leukocytosis of 18.4. Mild elevation in creatinine 1.4. Troponin 9 > 8. Portable chest x-ray shows increasing interstitial changes of the lungs, acute versus chronic. EDP talked to GI Dr. Levon Hedger, recommended MRCP with see in consult. IV Zosyn started.  Review of Systems: As per HPI all other systems reviewed and negative.  Past Medical History:  Diagnosis Date   Allergy    COPD (chronic obstructive pulmonary disease) (HCC)    CVA (cerebral vascular accident) (HCC) 2012   denies residual on 07/22/2015   ED (erectile dysfunction)    Family history of adverse reaction to anesthesia    "think my mother had real bad headaches after anesthesia"   GERD (gastroesophageal reflux disease)     Hypercholesterolemia    Hypertension    IFG (impaired fasting glucose)    NSTEMI (non-ST elevated myocardial infarction) (HCC)    Pneumonia ~ 2005    Past Surgical History:  Procedure Laterality Date   APPENDECTOMY  2009 [facility]   BACK SURGERY     BIOPSY  05/04/2014   Procedure: BIOPSY;  Surgeon: West Bali, MD;  [facility];  Service: Endoscopy;;   CARDIAC CATHETERIZATION N/A 07/18/2015   Procedure: Left Heart Cath and Coronary Angiography;  Surgeon: Lyn Records, MD;  Location: [facility] INVASIVE CV LAB;  Service: Cardiovascular;  Laterality: N/A;   CARDIAC CATHETERIZATION N/A 07/18/2015   Procedure: Coronary Stent Intervention;  Surgeon: Lyn Records, MD;  Location: [facility] INVASIVE CV LAB;  Service: Cardiovascular;  Laterality: N/A;   CARDIAC CATHETERIZATION N/A 07/22/2015   Procedure: Coronary Stent Intervention;  Surgeon: Lyn Records, MD;  Location: [facility] INVASIVE CV LAB;  Service: Cardiovascular;  Laterality: N/A;   COLONOSCOPY  2009   COLONOSCOPY N/A 08/04/2019   Procedure: COLONOSCOPY;  Surgeon: West Bali, MD;  Location: [facility];  Service: Endoscopy;  Laterality: N/A;  12:30   ESOPHAGOGASTRODUODENOSCOPY N/A 05/04/2014   Procedure: ESOPHAGOGASTRODUODENOSCOPY (EGD);  Surgeon: West Bali, MD;  [facility]: [facility];  Service: Endoscopy;  Laterality: N/A;  9:15   POLYPECTOMY  08/04/2019   Procedure: POLYPECTOMY;  Surgeon: West Bali, MD;  Location: [facility];  Service: Endoscopy;;  hepatic flexure, ascending colon, transverse colon, splenic flexure, descending colon  POSTERIOR LAMINECTOMY / DECOMPRESSION LUMBAR SPINE  06/03/2015   SAVORY DILATION N/A 05/04/2014   Procedure: SAVORY DILATION;  Surgeon: West Bali, MD;  Location: [facility];  Service: Endoscopy;  Laterality: N/A;     reports that he quit smoking about 37 years ago. His smoking use included cigarettes. He started smoking about 61 years ago. He has a 48.3 pack-year smoking history. He has  never used smokeless tobacco. He reports current alcohol use of about 6.0 standard drinks of alcohol per week. He reports that he does not use drugs.  No Known Allergies  Family History  Problem Relation Age of Onset   Hypertension Mother    Hypertension Father    Heart attack Father    Stroke Father    Colon cancer Neg Hx    Colon polyps Neg Hx     Prior to Admission medications   Medication Sig Start Date End Date Taking? Authorizing Provider  potassium chloride SA (KLOR-CON M) 20 MEQ tablet TAKE 1 TABLET(20 MEQ) BY MOUTH DAILY 09/29/23   Everlene Other G, DO  tiZANidine (ZANAFLEX) 2 MG tablet Take 2 mg by mouth 2 (two) times daily as needed. 09/20/23  Yes [provider]  albuterol (VENTOLIN HFA) 108 (90 Base) MCG/ACT inhaler INHALE 2 PUFFS INTO THE LUNGS EVERY 6 HOURS AS NEEDED FOR WHEEZING OR SHORTNESS OF BREATH 07/13/23   Everlene Other G, DO  amLODipine (NORVASC) 2.5 MG tablet TAKE 1 TABLET(2.5 MG) BY MOUTH DAILY 09/07/23   Nahser, Deloris Ping, MD  Ascorbic Acid (VITAMIN C PO) Take 4 tablets by mouth daily. Takes 4000 mg daily    [provider]  atorvastatin (LIPITOR) 80 MG tablet TAKE 1 TABLET BY MOUTH DAILY 09/08/23   Cook, Velma G, DO  benazepril (LOTENSIN) 20 MG tablet TAKE 1 TABLET(20 MG) BY MOUTH DAILY 08/24/23   [name], DO  Cholecalciferol 25 MCG (1000 UT) tablet Take by mouth daily.    [provider]  clopidogrel (PLAVIX) 75 MG tablet TAKE 1 TABLET(75 MG) BY MOUTH DAILY 07/12/23   Everlene Other G, DO  Cyanocobalamin (VITAMIN B-12 CR) 1000 MCG TBCR Take 1 tablet by mouth daily.    [provider]  dorzolamide-timolol (COSOPT) 22.3-6.8 MG/ML ophthalmic solution SMARTSIG:1 Drop(s) In Eye(s) Every 12 Hours 03/28/21   [provider]  fish oil-omega-3 fatty acids 1000 MG capsule Take 2 g by mouth daily.    [provider]  fluticasone (FLONASE) 50 MCG/ACT nasal spray SHAKE LIQUID AND USE 1 SPRAY IN EACH NOSTRIL DAILY AS NEEDED FOR  ALLERGIES OR RHINITIS 04/23/23   [name], DO  Iron, Ferrous Sulfate, 325 (65 Fe) MG TABS Take 325 mg by mouth every other day. 08/25/22   Cook, Jayce G, DO  JARDIANCE 10 MG TABS tablet TAKE 1 TABLET(10 MG) BY MOUTH DAILY 11/19/22   Cook, Jayce G, DO  latanoprost (XALATAN) 0.005 % ophthalmic solution Place into both eyes at bedtime. 08/21/23   [provider]  metoprolol succinate (TOPROL XL) 25 MG 24 hr tablet Take 1 tablet (25 mg total) by mouth daily. 07/23/22   Gaston Islam., NP  nitroGLYCERIN (NITROSTAT) 0.4 MG SL tablet Place 1 tablet (0.4 mg total) under the tongue every 5 (five) minutes as needed for chest pain (maxium 3 tablets daily). Do not take within 36 hrs of Cialis use 12/21/16   Lyn Records, MD  ofloxacin (OCUFLOX) 0.3 % ophthalmic solution  07/22/22   [provider]  ondansetron (ZOFRAN-ODT) 4 MG disintegrating tablet Take 1 tablet (4 mg total) by mouth every 8 (eight) hours as needed for nausea or vomiting. 08/18/23   Lanelle Bal, DO  OVER THE COUNTER MEDICATION IMMODIUM    [provider]  pantoprazole (PROTONIX) 40 MG tablet TAKE 1 TABLET(40 MG) BY MOUTH EVERY MORNING 08/18/23   Lanelle Bal, DO  Zinc Acetate 50 MG CAPS Take 50 mg by mouth daily.     [provider]    Physical Exam: Vitals:   10/04/23 1530 10/04/23 1531 10/04/23 1535 10/04/23 1537  BP: 121/62     Pulse: (!) 58 (!) 57 61   Resp: (!) 26 (!) 23 (!) 24   Temp:    98.1 F (36.7 C)  TempSrc:    Oral  SpO2: (!) 89% (!) 89% 94%     Constitutional: Hard of hearing, calm, comfortable Vitals:   10/04/23 1530 10/04/23 1531 10/04/23 1535 10/04/23 1537  BP: 121/62     Pulse: (!) 58 (!) 57 61   Resp: (!) 26 (!) 23 (!) 24   Temp:    98.1 F (36.7 C)  TempSrc:    Oral  SpO2: (!) 89% (!) 89% 94%    Eyes: PERRL, lids and conjunctivae normal ENMT: Mucous membranes are moist.  Neck: normal, supple, no masses, no thyromegaly Respiratory: clear to  auscultation bilaterally, no wheezing, no crackles. Normal respiratory effort. No accessory muscle use.  Cardiovascular: Regular rate and rhythm, no murmurs / rubs / gallops. No extremity edema.  Extremities warm.   Abdomen: no tenderness, no masses palpated. No hepatosplenomegaly. Bowel sounds positive.  Musculoskeletal: no clubbing / cyanosis. No joint deformity upper and lower extremities.  Skin: no rashes, lesions, ulcers. No induration Neurologic: No facial asymmetry, moving extremities spontaneously, speech fluent.  Psychiatric: Normal judgment and insight. Alert and oriented x 3. Normal mood.   Labs on Admission: I have personally reviewed following labs and imaging studies  CBC: Recent Labs  Lab 10/04/23 1127  WBC 18.4*  HGB 17.3*  HCT 53.7*  MCV 95.0  PLT 158   Basic Metabolic Panel: Recent Labs  Lab 10/04/23 1127  NA 137  K 4.5  CL 106  CO2 18*  GLUCOSE 95  BUN 9  CREATININE 1.49*  CALCIUM 9.5   GFR: CrCl cannot be calculated (Unknown ideal weight.). Liver Function Tests: Recent Labs  Lab 10/04/23 1127  AST 194*  ALT 120*  ALKPHOS 94  BILITOT 4.6*  PROT 6.1*  ALBUMIN 2.8*   Recent Labs  Lab 10/04/23 1127  LIPASE 37   CBG: Recent Labs  Lab 10/04/23 1205  GLUCAP 124*   Urine analysis:    Component Value Date/Time   PROTEINUR 30 06/28/2014 0924   LEUKOCYTESUR Trace 06/28/2014 0924    Radiological Exams on Admission: CT ABDOMEN PELVIS W CONTRAST  Result Date: 10/04/2023 CLINICAL DATA:  Acute onset abdominal pain radiating to the lower back EXAM: CT ABDOMEN AND PELVIS WITH CONTRAST TECHNIQUE: Multidetector CT imaging of the abdomen and pelvis was performed using the standard protocol following bolus administration of intravenous contrast. RADIATION DOSE REDUCTION: This exam was performed according to the departmental dose-optimization program which includes automated exposure control, adjustment of the mA and/or kV according to patient size  and/or use of iterative reconstruction technique. CONTRAST:  OMNIPAQUE IOHEXOL 300 MG/ML  SOLN COMPARISON:  CT abdomen and pelvis dated 11/21/2015 FINDINGS: Lower chest: No focal consolidation or pulmonary nodule in the lung bases. No pleural  effusion or pneumothorax demonstrated. Partially imaged heart size is normal. Coronary artery calcifications. Hepatobiliary: Cirrhotic morphology. Subcentimeter segment 5/6 hypodensity (2:25), too small to characterize. Mild periportal edema. No intra or extrahepatic biliary ductal dilation. Cholelithiasis. Pancreas: 4 mm hypodensity in the pancreatic body (4:51), too small to characterize. No main pancreatic ductal dilation. Spleen: Normal in size without focal abnormality. Adrenals/Urinary Tract: No adrenal nodules. No suspicious renal mass, calculi or hydronephrosis. No focal bladder wall thickening. Stomach/Bowel: Minimally dilated fluid-filled esophagus. Normal appearance of the stomach. Mild circumferential mural thickening of the ascending colon. A few mildly dilated loops of right hemi abdominal small bowel with interspersed areas of mild circumferential mural thickening. Moderate volume stool in the rectum. Colonic diverticulosis without acute diverticulitis. Appendectomy. Vascular/Lymphatic: Aortic atherosclerosis. Upper abdominal varices. 14 mm periportal lymph node (2:26). Reproductive: Prostate is unremarkable. Other: Small volume ascites. No free air or fluid collection. A few scattered punctate calcifications along the inferior right hepatic lobe (2:34), unchanged. Musculoskeletal: No acute or abnormal lytic or blastic osseous lesions. Multilevel degenerative changes of the partially imaged thoracic and lumbar spine. Small fat-containing left inguinal hernia. IMPRESSION: 1. Mild circumferential mural thickening of the ascending colon and a few mildly dilated loops of right hemi abdominal small bowel with interspersed areas of mild circumferential mural  thickening may represent infectious/inflammatory enterocolitis or portal enterocolopathy. 2. 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 3. Cholelithiasis. 4. 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 No main ductal dilation, mass lesion, or abnormal enhancement. Follow-up imaging in 2 years can be considered with contrast-enhanced MRI/MRCP if the patient is a surgical candidate. Alternatively, attention on follow-up. 5.  Aortic Atherosclerosis (ICD10-I70.0). Electronically Signed   By: Agustin Cree M.D.   On: 10/04/2023 14:56   DG Chest Port 1 View  Result Date: 10/04/2023 CLINICAL DATA:  Chest pain EXAM: PORTABLE CHEST 1 VIEW COMPARISON:  X-ray 07/16/2015 FINDINGS: No consolidation, pneumothorax or effusion. No edema. Normal cardiopericardial silhouette. Increasing interstitial changes. Overlapping cardiac leads. Degenerative changes along the spine. IMPRESSION: Increasing interstitial changes of the lungs. Acute versus chronic. Please correlate with clinical presentation and recommend short follow-up. Electronically Signed   By: Karen Kays M.D.   On: 10/04/2023 12:20    EKG: Independently reviewed.  Sinus rhythm, PACs.  Rate 82.  QTc 466.  No significant change from prior.  Assessment/Plan Principal Problem:   Elevated liver enzymes Active Problems:   Enterocolitis   Acute hypoxic respiratory failure (HCC)   Essential hypertension, benign   Type 2 diabetes mellitus with other circulatory complications (HCC)   CAD S/P percutaneous coronary angioplasty   COPD with emphysema (HCC)   Severe sepsis (HCC)   Assessment and Plan: * Elevated liver enzymes Presents with abdominal pain, AST 194, ALT 120, T. bili 4.9 with normal ALP of 94.  Also with leukocytosis of 18.4.  Tachypnea 18-26.  With lactic acid of 3.7, mild AKI.  Afebrile  mental status is intact.  CT with findings suggestive of cirrhosis, hypertension, small volume ascites and upper abdominal varices, cholelithiasis. - EDP talked to GI Dr. Levon Hedger, MRCP ordered -Hepatitis work up- per GI -Trend liver enzymes -Hold statins -If increasing ascites may need to consider paracentesis, to rule out SBP  AKI (acute kidney injury) (HCC) Mild. Creatinine 1.49, baseline 0.9-1.1. -Hydrate, hold lisinopril  Severe sepsis (HCC) Meeting severe sepsis criteria with leukocytosis of 18.4, and tachypnea respirate  rate 14-26.  With evidence of endorgan dysfunction, lactic acidosis of 3.7, AKI, and hypoxia.  Source of infection-intra-abdominal pathology- enterocolitis, or liver pathology versus lung infection. -Follow-up blood cultures -IV Zosyn, add azithromycin -Trend lactic acid -1.5 L bolus, continue N/s 100cc/hr x 15hrs  Acute hypoxic respiratory failure (HCC) O2 sats down to 88% on room air.  Not on home O2.  History of COPD.  Chest x-ray showing increased interstitial changes acute versus chronic.  Reports increasing difficulty breathing over the past 2 months, with cough related to his allergies and postnasal drip.  Unremarkable lung exam. -Check pro-calcitonin -With leukocytosis of 18.4 , Zosyn started in ED, will add azithromycin -DuoNebs as needed - COVID check  Enterocolitis Presents with abdominal pain, diarrhea-that appears to have resolved, with leukocytosis, meeting severe sepsis criteria.  CT - mild circumferential mural thickening of the ascending colon, findings may represent infectious/inflammatory enterocolitis or portal enterocolopathy (Pls see detailed report). -IV Zosyn started in ED, will continue, add azithromycin for possible lung infection  CAD S/P percutaneous coronary angioplasty Presents with chest and abdominal pain.  PCI with DES 2016.  Troponin EKG unremarkable today.  Chest pain likely related to abdominal pathology. -Reports compliance  with Plavix hold for now pending MRI result - Hold Statins with elevated liver enzymes  COPD with emphysema (HCC) Clear lung exam.  Hypoxic today.  Not on home O2. -Nebs as needed  Type 2 diabetes mellitus with other circulatory complications (HCC) Controlled.  A1c 5.8. ??  Tight control. -Hold Jardiance - Daily fasting CBG  Essential hypertension, benign Soft to stable. -Hold Norvasc, and hold benazepril for contrast exposure - Pending med rec, resume metoprolol   DVT prophylaxis: heparin Code Status: FULL code Family Communication: Significant other at bedside Disposition Plan: ~ 2 days Consults called: GI Admission status: Inpt Tele I certify that at the point of admission it is my clinical judgment that the patient will require inpatient hospital care spanning beyond 2 midnights from the point of admission due to high intensity of service, high risk for further deterioration and high frequency of surveillance required.    Author: Onnie Boer, MD 10/04/2023 6:00 PM  For on call review [email or web address].

## 2023-10-04 NOTE — ED Provider Notes (Signed)
[place] EMERGENCY DEPARTMENT AT [facility] Provider Note   CSN: [id number] Arrival date & time: 10/04/23  1055     History  Chief Complaint  Patient presents with   Abdominal Pain    Mitchell Oliver is a 77 y.o. male.  HPI   77 year old male presents to the clinic with complaints of chest pain, abdominal pain, back pain.  Patient states that he woke this morning with lower central chest pain.  States that he went to urinate when symptoms began.  States he laid back down and symptoms resolved before waking up later in the morning with development of worsening chest pain.  States that chest pain then seem to go below her in his abdomen and then he noted some pain in his lower back.  Denies history of similar symptoms in the past.  Does report history of MI but states his current symptoms are not at all similar to what he experienced on medication.  Denies any shortness of breath, urinary symptoms, change in bowel habits.  Last bowel movement Saturday but states he typically goes a day or 2 in between bowel movements.  Denies any fever, chills, vomiting but has felt nauseous.  Past medical history significant for CVA, GERD, NSTEMI, hypertension, hypercholesterolemia, COPD, diabetes mellitus type 2, melanoma  Home Medications Prior to Admission medications   Medication Sig Start Date End Date Taking? Authorizing Provider  potassium chloride SA (KLOR-CON M) 20 MEQ tablet TAKE 1 TABLET(20 MEQ) BY MOUTH DAILY 09/29/23   Everlene Other G, DO  tiZANidine (ZANAFLEX) 2 MG tablet Take 2 mg by mouth 2 (two) times daily as needed. 09/20/23  Yes [provider]  albuterol (VENTOLIN HFA) 108 (90 Base) MCG/ACT inhaler INHALE 2 PUFFS INTO THE LUNGS EVERY 6 HOURS AS NEEDED FOR WHEEZING OR SHORTNESS OF BREATH 07/13/23   Everlene Other G, DO  amLODipine (NORVASC) 2.5 MG tablet TAKE 1 TABLET(2.5 MG) BY MOUTH DAILY 09/07/23   Nahser, Deloris Ping, MD  Ascorbic Acid (VITAMIN C PO) Take 4 tablets by  mouth daily. Takes 4000 mg daily    [provider]  atorvastatin (LIPITOR) 80 MG tablet TAKE 1 TABLET BY MOUTH DAILY 09/08/23   Cook, [place] G, DO  benazepril (LOTENSIN) 20 MG tablet TAKE 1 TABLET(20 MG) BY MOUTH DAILY 08/24/23   [name], DO  Cholecalciferol 25 MCG (1000 UT) tablet Take by mouth daily.    [provider]  clopidogrel (PLAVIX) 75 MG tablet TAKE 1 TABLET(75 MG) BY MOUTH DAILY 07/12/23   Everlene Other G, DO  Cyanocobalamin (VITAMIN B-12 CR) 1000 MCG TBCR Take 1 tablet by mouth daily.    [provider]  dorzolamide-timolol (COSOPT) 22.3-6.8 MG/ML ophthalmic solution SMARTSIG:1 Drop(s) In Eye(s) Every 12 Hours 03/28/21   [provider]  fish oil-omega-3 fatty acids 1000 MG capsule Take 2 g by mouth daily.    [provider]  fluticasone (FLONASE) 50 MCG/ACT nasal spray SHAKE LIQUID AND USE 1 SPRAY IN EACH NOSTRIL DAILY AS NEEDED FOR ALLERGIES OR RHINITIS 04/23/23   [name], DO  Iron, Ferrous Sulfate, 325 (65 Fe) MG TABS Take 325 mg by mouth every other day. 08/25/22   Cook, Jayce G, DO  JARDIANCE 10 MG TABS tablet TAKE 1 TABLET(10 MG) BY MOUTH DAILY 11/19/22   Cook, Jayce G, DO  latanoprost (XALATAN) 0.005 % ophthalmic solution Place into both eyes at bedtime. 08/21/23   [provider]  metoprolol succinate (TOPROL XL)  25 MG 24 hr tablet Take 1 tablet (25 mg total) by mouth daily. 07/23/22   Gaston Islam., NP  nitroGLYCERIN (NITROSTAT) 0.4 MG SL tablet Place 1 tablet (0.4 mg total) under the tongue every 5 (five) minutes as needed for chest pain (maxium 3 tablets daily). Do not take within 36 hrs of Cialis use 12/21/16   Lyn Records, MD  ofloxacin (OCUFLOX) 0.3 % ophthalmic solution  07/22/22   [provider]  ondansetron (ZOFRAN-ODT) 4 MG disintegrating tablet Take 1 tablet (4 mg total) by mouth every 8 (eight) hours as needed for nausea or vomiting. 08/18/23   Lanelle Bal, DO  OVER THE COUNTER MEDICATION  IMMODIUM    [provider]  pantoprazole (PROTONIX) 40 MG tablet TAKE 1 TABLET(40 MG) BY MOUTH EVERY MORNING 08/18/23   Lanelle Bal, DO  Zinc Acetate 50 MG CAPS Take 50 mg by mouth daily.     [provider]      Allergies    Patient has no known allergies.    Review of Systems   Review of Systems  All other systems reviewed and are negative.   Physical Exam Updated Vital Signs BP 115/70   Pulse (!) 58   Temp 98.1 F (36.7 C) (Oral)   Resp 19   SpO2 92%  Physical Exam Vitals and nursing note reviewed.  Constitutional:      General: He is not in acute distress.    Appearance: He is well-developed.  HENT:     Head: Normocephalic and atraumatic.  Eyes:     Conjunctiva/sclera: Conjunctivae normal.  Cardiovascular:     Rate and Rhythm: Normal rate and regular rhythm.     Comments: Pedal pulses 2+ bilaterally. Pulmonary:     Effort: Pulmonary effort is normal. No respiratory distress.     Breath sounds: Normal breath sounds.  Abdominal:     Palpations: Abdomen is soft.     Tenderness: There is abdominal tenderness in the right upper quadrant and epigastric area. There is no right CVA tenderness or left CVA tenderness.  Musculoskeletal:        General: No swelling.     Cervical back: Neck supple.  Skin:    General: Skin is warm and dry.     Capillary Refill: Capillary refill takes less than 2 seconds.  Neurological:     Mental Status: He is alert.  Psychiatric:        Mood and Affect: Mood normal.     ED Results / Procedures / Treatments   Labs (all labs ordered are listed, but only abnormal results are displayed) Labs Reviewed  COMPREHENSIVE METABOLIC PANEL - Abnormal; Notable for the following components:      Result Value   CO2 18 (*)    Creatinine, Ser 1.49 (*)    Total Protein 6.1 (*)    Albumin 2.8 (*)    AST 194 (*)    ALT 120 (*)    Total Bilirubin 4.6 (*)    GFR, Estimated 47 (*)    All other components within normal limits   CBC - Abnormal; Notable for the following components:   WBC 18.4 (*)    Hemoglobin 17.3 (*)    HCT 53.7 (*)    All other components within normal limits  URINALYSIS, ROUTINE W REFLEX MICROSCOPIC - Abnormal; Notable for the following components:   Color, Urine AMBER (*)    Specific Gravity, Urine >1.046 (*)    Glucose, UA  150 (*)    Protein, ur 100 (*)    Non Squamous Epithelial 0-5 (*)    All other components within normal limits  CBG MONITORING, ED - Abnormal; Notable for the following components:   Glucose-Capillary 124 (*)    All other components within normal limits  CULTURE, BLOOD (ROUTINE X 2)  CULTURE, BLOOD (ROUTINE X 2)  LIPASE, BLOOD  LACTIC ACID, PLASMA  LACTIC ACID, PLASMA  BRAIN NATRIURETIC PEPTIDE  HEPATITIS PANEL, ACUTE  PROTIME-INR  MITOCHONDRIAL ANTIBODIES  ANTI-SMOOTH MUSCLE ANTIBODY, IGG  ANA W/REFLEX IF POSITIVE  IGG, IGA, IGM  CERULOPLASMIN  IRON AND TIBC  FERRITIN  TROPONIN I (HIGH SENSITIVITY)  TROPONIN I (HIGH SENSITIVITY)    EKG None  Radiology CT ABDOMEN PELVIS W CONTRAST  Result Date: 10/04/2023 CLINICAL DATA:  Acute onset abdominal pain radiating to the lower back EXAM: CT ABDOMEN AND PELVIS WITH CONTRAST TECHNIQUE: Multidetector CT imaging of the abdomen and pelvis was performed using the standard protocol following bolus administration of intravenous contrast. RADIATION DOSE REDUCTION: This exam was performed according to the departmental dose-optimization program which includes automated exposure control, adjustment of the mA and/or kV according to patient size and/or use of iterative reconstruction technique. CONTRAST:  OMNIPAQUE IOHEXOL 300 MG/ML  SOLN COMPARISON:  CT abdomen and pelvis dated 11/21/2015 FINDINGS: Lower chest: No focal consolidation or pulmonary nodule in the lung bases. No pleural effusion or pneumothorax demonstrated. Partially imaged heart size is normal. Coronary artery calcifications. Hepatobiliary: Cirrhotic  morphology. Subcentimeter segment 5/6 hypodensity (2:25), too small to characterize. Mild periportal edema. No intra or extrahepatic biliary ductal dilation. Cholelithiasis. Pancreas: 4 mm hypodensity in the pancreatic body (4:51), too small to characterize. No main pancreatic ductal dilation. Spleen: Normal in size without focal abnormality. Adrenals/Urinary Tract: No adrenal nodules. No suspicious renal mass, calculi or hydronephrosis. No focal bladder wall thickening. Stomach/Bowel: Minimally dilated fluid-filled esophagus. Normal appearance of the stomach. Mild circumferential mural thickening of the ascending colon. A few mildly dilated loops of right hemi abdominal small bowel with interspersed areas of mild circumferential mural thickening. Moderate volume stool in the rectum. Colonic diverticulosis without acute diverticulitis. Appendectomy. Vascular/Lymphatic: Aortic atherosclerosis. Upper abdominal varices. 14 mm periportal lymph node (2:26). Reproductive: Prostate is unremarkable. Other: Small volume ascites. No free air or fluid collection. A few scattered punctate calcifications along the inferior right hepatic lobe (2:34), unchanged. Musculoskeletal: No acute or abnormal lytic or blastic osseous lesions. Multilevel degenerative changes of the partially imaged thoracic and lumbar spine. Small fat-containing left inguinal hernia. IMPRESSION: 1. Mild circumferential mural thickening of the ascending colon and a few mildly dilated loops of right hemi abdominal small bowel with interspersed areas of mild circumferential mural thickening may represent infectious/inflammatory enterocolitis or portal enterocolopathy. 2. 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 3. Cholelithiasis. 4. 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 No main ductal  dilation, mass lesion, or abnormal enhancement. Follow-up imaging in 2 years can be considered with contrast-enhanced MRI/MRCP if the patient is a surgical candidate. Alternatively, attention on follow-up. 5.  Aortic Atherosclerosis (ICD10-I70.0). Electronically Signed   By: Agustin Cree M.D.   On: 10/04/2023 14:56   DG Chest Port 1 View  Result Date: 10/04/2023 CLINICAL DATA:  Chest pain EXAM: PORTABLE CHEST 1 VIEW COMPARISON:  X-ray 07/16/2015 FINDINGS: No consolidation, pneumothorax or effusion. No edema. Normal cardiopericardial silhouette. Increasing interstitial changes. Overlapping  cardiac leads. Degenerative changes along the spine. IMPRESSION: Increasing interstitial changes of the lungs. Acute versus chronic. Please correlate with clinical presentation and recommend short follow-up. Electronically Signed   By: Karen Kays M.D.   On: 10/04/2023 12:20    Procedures Procedures    Medications Ordered in ED Medications  piperacillin-tazobactam (ZOSYN) IVPB 3.375 g (has no administration in time range)  fentaNYL (SUBLIMAZE) injection 25 mcg (25 mcg Intravenous Given 10/04/23 1215)  sodium chloride 0.9 % bolus 500 mL (500 mLs Intravenous Bolus 10/04/23 1221)  ondansetron (ZOFRAN) injection 4 mg ( Intravenous Not Given 10/04/23 1335)  iohexol (OMNIPAQUE) 300 MG/ML solution 100 mL (100 mLs Intravenous Contrast Given 10/04/23 1228)  piperacillin-tazobactam (ZOSYN) IVPB 3.375 g (0 g Intravenous Stopped 10/04/23 1607)    ED Course/ Medical Decision Making/ A&P Clinical Course as of 10/04/23 1646  Mon Oct 04, 2023  1534 Consulted gastroenterology Dr. Earmon Phoenix regarding the patient who recommended MRCP of abdomen admission through hospitalist. [CR]  [phone] [facility] hospitalist agreed with admission and assume further treatment/care. [CR]    Clinical Course User Index [CR] Peter Garter, PA                                 Medical Decision Making Amount and/or Complexity of Data  Reviewed Labs: ordered. Radiology: ordered.  Risk Prescription drug management. Decision regarding hospitalization.   This patient presents to the ED for concern of chest pain, abdominal pain, back pain, this involves an extensive number of treatment options, and is a complaint that carries with it a high risk of complications and morbidity.  The differential diagnosis includes ACS, gastritis, PUD, CBD pathology, cholecystitis, aortic dissection/aneurysm, other   Co morbidities that complicate the patient evaluation  See HPI   Additional history obtained:  Additional history obtained from EMR External records from outside source obtained and reviewed including hospital records   Lab Tests:  I Ordered, and personally interpreted labs.  The pertinent results include: Leukocytosis of 18.4.  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  Placed within range.  Decreased bicarb of 18 otherwise, electrolytes within normal limits.  Patient with elevated creatinine 1.49 normal BUN and GFR 47 which is somewhat near patient's baseline.  Transaminitis with AST, ALT of 194 120 respectively with elevation of total bilirubin of 4.6.  Initial troponin of 9 with repeat 8.   Imaging Studies ordered:  I ordered imaging studies including chest x-ray, CT I independently visualized and interpreted imaging which showed  Chest x-ray: Increased interstitial changes in the lungs. CT abdomen pelvis: Mild circumferential mural thickening of ascending colon.  Cirrhotic morphology with portal hypertension with small volume ascites.  Cholelithiasis.  Hypoattenuating focus in the pancreatic body measuring 4 mm.  Aortic atherosclerosis. I agree with the radiologist interpretation  Cardiac Monitoring: / EKG:  The patient was maintained on a cardiac monitor.  I personally viewed and interpreted the cardiac monitored which showed an underlying rhythm of: Sinus rhythm with PAC   Consultations Obtained:  See ED  course  Problem List / ED Course / Critical interventions / Medication management  Abdominal pain, transaminitis, pancreatic mass, cirrhosis I ordered medication including fentanyl, Zosyn, normal saline   Reevaluation of the patient after these medicines showed that the patient improved I have reviewed the patients home medicines and have made adjustments as needed   Social Determinants of Health:  Former cigarette use.  Denies illicit drug use.  Test / Admission - Considered:  Abdominal pain, transaminitis, pancreatic mass, cirrhosis Vitals signs significant for hypoxia after fentanyl was administered; patient subsequently placed on 2 L nasal cannula with prompt response.. Otherwise within normal range and stable throughout visit. Laboratory/imaging studies significant for: See above 77 year old male presents emergency department with complaints of lower chest/upper abdominal pain that began abruptly this morning.  On exam initially, patient with tenderness most so in epigastric and right upper quadrant region.  Patient's labs concerning for transaminitis, elevation of total bilirubin with normal lipase.  CT imaging showed multiple findings as above-circumferential mural thickening ascending colon, cirrhotic morphology with portal hypertension and small ascites, hypoattending focus pancreatic body, cholelithiasis, etc.  No obvious CT imaging findings concerning for CBD/pancreatic duct blockage/abnormality.  Given CT imaging findings in the setting of transaminitis as well as elevation total bilirubin, consulted GI who recommended admission and MRCP.  Will admit to hospitalist.         Final Clinical Impression(s) / ED Diagnoses Final diagnoses:  Right upper quadrant abdominal pain  Transaminitis  Pancreatic mass  Cirrhosis of liver with ascites, unspecified hepatic cirrhosis type [facility])    Rx / DC Orders ED Discharge Orders     None         Peter Garter,  [place] 10/04/23 1647    Royanne Foots, DO 10/10/23 2013

## 2023-10-04 NOTE — Assessment & Plan Note (Addendum)
Presents with chest and abdominal pain.  PCI with DES 2016.  Troponin EKG unremarkable today.  Chest pain likely related to abdominal pathology. -Reports compliance with Plavix hold for now pending MRI result - Hold Statins with elevated liver enzymes

## 2023-10-04 NOTE — Assessment & Plan Note (Addendum)
Presents with abdominal pain, AST 194, ALT 120, T. bili 4.9 with normal ALP of 94.  Also with leukocytosis of 18.4.  Tachypnea 18-26.  With lactic acid of 3.7, mild AKI.  Afebrile mental status is intact.  CT with findings suggestive of cirrhosis, hypertension, small volume ascites and upper abdominal varices, cholelithiasis. - EDP talked to GI Dr. Levon Hedger, MRCP ordered -Hepatitis work up- per GI -Trend liver enzymes -Hold statins -If increasing ascites may need to consider paracentesis, to rule out SBP

## 2023-10-04 NOTE — Assessment & Plan Note (Addendum)
O2 sats down to 88% on room air.  Not on home O2.  History of COPD.  Chest x-ray showing increased interstitial changes acute versus chronic.  Reports increasing difficulty breathing over the past 2 months, with cough related to his allergies and postnasal drip.  Unremarkable lung exam. -Check pro-calcitonin -With leukocytosis of 18.4 , Zosyn started in ED, will add azithromycin -DuoNebs as needed - COVID check

## 2023-10-05 ENCOUNTER — Inpatient Hospital Stay (HOSPITAL_COMMUNITY): Payer: Medicare Other

## 2023-10-05 DIAGNOSIS — N179 Acute kidney failure, unspecified: Secondary | ICD-10-CM | POA: Diagnosis not present

## 2023-10-05 DIAGNOSIS — R748 Abnormal levels of other serum enzymes: Secondary | ICD-10-CM | POA: Diagnosis not present

## 2023-10-05 DIAGNOSIS — A419 Sepsis, unspecified organism: Secondary | ICD-10-CM | POA: Diagnosis not present

## 2023-10-05 DIAGNOSIS — J439 Emphysema, unspecified: Secondary | ICD-10-CM | POA: Diagnosis not present

## 2023-10-05 LAB — BLOOD CULTURE ID PANEL (REFLEXED) - BCID2
A.calcoaceticus-baumannii: NOT DETECTED
Bacteroides fragilis: NOT DETECTED
CTX-M ESBL: NOT DETECTED
Candida albicans: NOT DETECTED
Candida auris: NOT DETECTED
Candida glabrata: NOT DETECTED
Candida krusei: NOT DETECTED
Candida parapsilosis: NOT DETECTED
Candida tropicalis: NOT DETECTED
Carbapenem resist OXA 48 LIKE: NOT DETECTED
Carbapenem resistance IMP: NOT DETECTED
Carbapenem resistance KPC: DETECTED — AB
Carbapenem resistance NDM: NOT DETECTED
Carbapenem resistance VIM: NOT DETECTED
Cryptococcus neoformans/gattii: NOT DETECTED
Enterobacter cloacae complex: NOT DETECTED
Enterobacterales: DETECTED — AB
Enterococcus Faecium: NOT DETECTED
Enterococcus faecalis: NOT DETECTED
Escherichia coli: NOT DETECTED
Haemophilus influenzae: NOT DETECTED
Klebsiella aerogenes: NOT DETECTED
Klebsiella oxytoca: NOT DETECTED
Klebsiella pneumoniae: NOT DETECTED
Listeria monocytogenes: NOT DETECTED
Neisseria meningitidis: NOT DETECTED
Proteus species: NOT DETECTED
Pseudomonas aeruginosa: NOT DETECTED
Salmonella species: NOT DETECTED
Serratia marcescens: NOT DETECTED
Staphylococcus aureus (BCID): NOT DETECTED
Staphylococcus epidermidis: NOT DETECTED
Staphylococcus lugdunensis: NOT DETECTED
Staphylococcus species: NOT DETECTED
Stenotrophomonas maltophilia: NOT DETECTED
Streptococcus agalactiae: NOT DETECTED
Streptococcus pneumoniae: NOT DETECTED
Streptococcus pyogenes: NOT DETECTED
Streptococcus species: NOT DETECTED

## 2023-10-05 LAB — BILIRUBIN, FRACTIONATED(TOT/DIR/INDIR)
Bilirubin, Direct: 1.9 mg/dL — ABNORMAL HIGH (ref 0.0–0.2)
Indirect Bilirubin: 1.1 mg/dL — ABNORMAL HIGH (ref 0.3–0.9)
Total Bilirubin: 3 mg/dL — ABNORMAL HIGH (ref <1.2)

## 2023-10-05 LAB — COMPREHENSIVE METABOLIC PANEL
ALT: 88 U/L — ABNORMAL HIGH (ref 0–44)
AST: 98 U/L — ABNORMAL HIGH (ref 15–41)
Albumin: 2.4 g/dL — ABNORMAL LOW (ref 3.5–5.0)
Alkaline Phosphatase: 72 U/L (ref 38–126)
Anion gap: 10 (ref 5–15)
BUN: 15 mg/dL (ref 8–23)
CO2: 16 mmol/L — ABNORMAL LOW (ref 22–32)
Calcium: 8.2 mg/dL — ABNORMAL LOW (ref 8.9–10.3)
Chloride: 105 mmol/L (ref 98–111)
Creatinine, Ser: 1.33 mg/dL — ABNORMAL HIGH (ref 0.61–1.24)
GFR, Estimated: 54 mL/min — ABNORMAL LOW (ref >60)
Glucose, Bld: 69 mg/dL — ABNORMAL LOW (ref 70–99)
Potassium: 4.8 mmol/L (ref 3.5–5.1)
Sodium: 131 mmol/L — ABNORMAL LOW (ref 135–145)
Total Bilirubin: 6.9 mg/dL — ABNORMAL HIGH (ref <1.2)
Total Protein: 5.3 g/dL — ABNORMAL LOW (ref 6.5–8.1)

## 2023-10-05 LAB — GLUCOSE, CAPILLARY
Glucose-Capillary: 108 mg/dL — ABNORMAL HIGH (ref 70–99)
Glucose-Capillary: 94 mg/dL (ref 70–99)
Glucose-Capillary: 80 mg/dL (ref 70–99)

## 2023-10-05 LAB — CBC
HCT: 42.6 % (ref 39.0–52.0)
Hemoglobin: 14 g/dL (ref 13.0–17.0)
MCH: 30.8 pg (ref 26.0–34.0)
MCHC: 32.9 g/dL (ref 30.0–36.0)
MCV: 93.6 fL (ref 80.0–100.0)
Platelets: 131 10*3/uL — ABNORMAL LOW (ref 150–400)
RBC: 4.55 MIL/uL (ref 4.22–5.81)
RDW: 14.8 % (ref 11.5–15.5)
WBC: 25.3 10*3/uL — ABNORMAL HIGH (ref 4.0–10.5)
nRBC: 0 % (ref 0.0–0.2)

## 2023-10-05 LAB — HEPATITIS PANEL, ACUTE
HCV Ab: NONREACTIVE
Hep A IgM: NONREACTIVE
Hep B C IgM: NONREACTIVE
Hepatitis B Surface Ag: NONREACTIVE

## 2023-10-05 LAB — PROTIME-INR
INR: 2.9 — ABNORMAL HIGH (ref 0.8–1.2)
Prothrombin Time: 30.9 s — ABNORMAL HIGH (ref 11.4–15.2)

## 2023-10-05 MED ORDER — DEXTROSE 5 % IV SOLN
2.5000 g | Freq: Three times a day (TID) | INTRAVENOUS | Status: DC
  Administered 2023-10-05 – 2023-10-07 (×5): 2.5 g via INTRAVENOUS
  Filled 2023-10-05 (×9): qty 12

## 2023-10-05 MED ORDER — TIZANIDINE HCL 2 MG PO TABS
2.0000 mg | ORAL_TABLET | Freq: Two times a day (BID) | ORAL | Status: DC | PRN
  Administered 2023-10-05: 2 mg via ORAL
  Filled 2023-10-05 (×2): qty 1

## 2023-10-05 MED ORDER — DORZOLAMIDE HCL-TIMOLOL MAL 2-0.5 % OP SOLN
1.0000 [drp] | Freq: Two times a day (BID) | OPHTHALMIC | Status: DC
Start: 2023-10-05 — End: 2023-10-07
  Administered 2023-10-05 – 2023-10-07 (×4): 1 [drp] via OPHTHALMIC
  Filled 2023-10-05: qty 10

## 2023-10-05 MED ORDER — ZINC SULFATE 220 (50 ZN) MG PO CAPS
220.0000 mg | ORAL_CAPSULE | Freq: Every evening | ORAL | Status: DC
  Administered 2023-10-05 – 2023-10-07 (×3): 220 mg via ORAL
  Filled 2023-10-05 (×3): qty 1

## 2023-10-05 MED ORDER — METOPROLOL SUCCINATE ER 25 MG PO TB24
25.0000 mg | ORAL_TABLET | Freq: Every day | ORAL | Status: DC
  Administered 2023-10-06: 25 mg via ORAL
  Filled 2023-10-05 (×2): qty 1

## 2023-10-05 MED ORDER — LATANOPROST 0.005 % OP SOLN
1.0000 [drp] | Freq: Every day | OPHTHALMIC | Status: DC
Start: 2023-10-05 — End: 2023-10-07
  Administered 2023-10-05 – 2023-10-06 (×2): 1 [drp] via OPHTHALMIC
  Filled 2023-10-05: qty 2.5

## 2023-10-05 MED ORDER — IBUPROFEN 400 MG PO TABS
400.0000 mg | ORAL_TABLET | Freq: Once | ORAL | Status: AC
  Administered 2023-10-05: 400 mg via ORAL
  Filled 2023-10-05: qty 1

## 2023-10-05 MED ORDER — PANTOPRAZOLE SODIUM 40 MG PO TBEC
40.0000 mg | DELAYED_RELEASE_TABLET | Freq: Every day | ORAL | Status: DC
  Administered 2023-10-06 – 2023-10-07 (×2): 40 mg via ORAL
  Filled 2023-10-05 (×2): qty 1

## 2023-10-05 NOTE — Inpatient Diabetes Management (Addendum)
Inpatient Diabetes Program Recommendations  AACE/ADA: New Consensus Statement on Inpatient Glycemic Control   Target Ranges:  Prepandial:   less than 140 mg/dL      Peak postprandial:   less than 180 mg/dL (1-2 hours)      Critically ill patients:  140 - 180 mg/dL    Latest Reference Range & Units 10/04/23 11:27 10/05/23 04:59  Glucose 70 - 99 mg/dL 95 69 (L)   Review of Glycemic Control  Diabetes history: DM2 Outpatient Diabetes medications: Jardiance 10 mg daily Current orders for Inpatient glycemic control: None  Inpatient Diabetes Program Recommendations:    Insulin: Please consider ordering CBGs AC&HS and Novolog 0-6 units TID with meals while inpatient.  Thanks, Orlando Penner, RN, MSN, CDCES Diabetes Coordinator Inpatient Diabetes Program [phone] (Team Pager from 8am to 5pm)

## 2023-10-05 NOTE — Plan of Care (Signed)

## 2023-10-05 NOTE — Progress Notes (Signed)
   10/05/23 1314  TOC Brief Assessment  Insurance and Status Reviewed  Patient has primary care physician Yes  Home environment has been reviewed Home alone  Prior level of function: independent  Prior/Current Home Services No current home services  Social Determinants of Health Reivew SDOH reviewed no interventions necessary  Readmission risk has been reviewed Yes  Transition of care needs no transition of care needs at this time   Transition of Care Department [facility]) has reviewed patient and no TOC needs have been identified at this time. We will continue to monitor patient advancement through interdisciplinary progression rounds. If new patient transition needs arise, please place a TOC consult.

## 2023-10-05 NOTE — Progress Notes (Signed)
PHARMACY - PHYSICIAN COMMUNICATION CRITICAL VALUE ALERT - BLOOD CULTURE IDENTIFICATION (BCID)  Mitchell Oliver is an 77 y.o. male who presented to [facility] on 10/04/2023 with a chief complaint of abdominal pain.   Assessment:  77 y.o. male with medical history significant for COPD, coronary artery disease, hypertension, CVA, diabetes mellitus, NSTEMI. Presented to [facility] with chest and abdominal pain. Elevated LFTs, GI workup is ongoing. Now with MDR bacteremia.   Name of physician (or Provider) Contacted: Thedore Mins (ID) Laural Benes [facility])  Current antibiotics: Zosyn  Changes to prescribed antibiotics recommended:  Will change to ceftazidime/avibactam 2.5g IV q8 hours  per protocol.  ID has been notified.   Results for orders placed or performed during the hospital encounter of 10/04/23  Blood Culture ID Panel (Reflexed) (Collected: 10/04/2023  3:25 PM)  Result Value Ref Range   Enterococcus faecalis NOT DETECTED NOT DETECTED   Enterococcus Faecium NOT DETECTED NOT DETECTED   Listeria monocytogenes NOT DETECTED NOT DETECTED   Staphylococcus species NOT DETECTED NOT DETECTED   Staphylococcus aureus (BCID) NOT DETECTED NOT DETECTED   Staphylococcus epidermidis NOT DETECTED NOT DETECTED   Staphylococcus lugdunensis NOT DETECTED NOT DETECTED   Streptococcus species NOT DETECTED NOT DETECTED   Streptococcus agalactiae NOT DETECTED NOT DETECTED   Streptococcus pneumoniae NOT DETECTED NOT DETECTED   Streptococcus pyogenes NOT DETECTED NOT DETECTED   A.calcoaceticus-baumannii NOT DETECTED NOT DETECTED   Bacteroides fragilis NOT DETECTED NOT DETECTED   Enterobacterales DETECTED (A) NOT DETECTED   Enterobacter cloacae complex NOT DETECTED NOT DETECTED   Escherichia coli NOT DETECTED NOT DETECTED   Klebsiella aerogenes NOT DETECTED NOT DETECTED   Klebsiella oxytoca NOT DETECTED NOT DETECTED   Klebsiella pneumoniae NOT DETECTED NOT DETECTED   Proteus species NOT DETECTED NOT DETECTED   Salmonella  species NOT DETECTED NOT DETECTED   Serratia marcescens NOT DETECTED NOT DETECTED   Haemophilus influenzae NOT DETECTED NOT DETECTED   Neisseria meningitidis NOT DETECTED NOT DETECTED   Pseudomonas aeruginosa NOT DETECTED NOT DETECTED   Stenotrophomonas maltophilia NOT DETECTED NOT DETECTED   Candida albicans NOT DETECTED NOT DETECTED   Candida auris NOT DETECTED NOT DETECTED   Candida glabrata NOT DETECTED NOT DETECTED   Candida krusei NOT DETECTED NOT DETECTED   Candida parapsilosis NOT DETECTED NOT DETECTED   Candida tropicalis NOT DETECTED NOT DETECTED   Cryptococcus neoformans/gattii NOT DETECTED NOT DETECTED   CTX-M ESBL NOT DETECTED NOT DETECTED   Carbapenem resistance IMP NOT DETECTED NOT DETECTED   Carbapenem resistance KPC DETECTED (A) NOT DETECTED   Carbapenem resistance NDM NOT DETECTED NOT DETECTED   Carbapenem resist OXA 48 LIKE NOT DETECTED NOT DETECTED   Carbapenem resistance VIM NOT DETECTED NOT DETECTED    Sheppard Coil PharmD., BCPS Clinical Pharmacist 10/05/2023 2:53 PM

## 2023-10-05 NOTE — Progress Notes (Addendum)
Subjective: Abdominal pain:  No recurrent abdominal pain after pain meds yesterday Mild nausea last night and this morning. No vomiting.   Diarrhea:  1 loose BM this morning. Prior to this was having normal bowel movements. Hasn't had diarrhea in about 3 weeks.   No known brbpr or melena.   Cirrhosis:  HE: None Edema/abdominal distension: None Bleeding: None.   Having shortness of breath with exertion for the last month.   Objective: Vital signs in last 24 hours: Temp:  [98.1 F (36.7 C)-98.6 F (37 C)] 98.1 F (36.7 C) (12/10 0813) Pulse Rate:  [54-67] 58 (12/10 0417) Resp:  [14-27] 20 (12/10 0813) BP: (103-133)/(61-88) 122/62 (12/10 0813) SpO2:  [88 %-98 %] 98 % (12/10 0813) Last BM Date : 10/05/23 General:   Alert and oriented, pleasant, NAD, jaundiced.  Head:  Normocephalic and atraumatic. Eyes: + scleral icterus.   Abdomen:  Bowel sounds present, soft, non-tender, non-distended. No HSM or hernias noted. No rebound or guarding. No masses appreciated  Msk:  Symmetrical without gross deformities. Normal posture. Extremities:  Without edema. Neurologic:  Alert and  oriented x4;  grossly normal neurologically. No asterixis.  Skin:  Warm and dry, intact without significant lesions.  Psych:  Normal mood and affect.  Intake/Output from previous day: 12/09 0701 - 12/10 0700 In: 926.7 [I.V.:626.7; IV Piggyback:300] Out: -  Intake/Output this shift: Total I/O In: 1898.4 [P.O.:118; I.V.:498; IV Piggyback:1282.5] Out: 0   Lab Results: Recent Labs    10/04/23 1127 10/05/23 0459  WBC 18.4* 25.3*  HGB 17.3* 14.0  HCT 53.7* 42.6  PLT 158 131*   BMET Recent Labs    10/04/23 1127 10/05/23 0459  NA 137 131*  K 4.5 4.8  CL 106 105  CO2 18* 16*  GLUCOSE 95 69*  BUN 9 15  CREATININE 1.49* 1.33*  CALCIUM 9.5 8.2*   LFT Recent Labs    10/04/23 1127 10/05/23 0459 10/05/23 0921  PROT 6.1* 5.3*  --   ALBUMIN 2.8* 2.4*  --   AST 194* 98*  --   ALT 120* 88*   --   ALKPHOS 94 72  --   BILITOT 4.6* 6.9* 3.0*  BILIDIR  --   --  1.9*  IBILI  --   --  1.1*   PT/INR Recent Labs    10/04/23 1851 10/05/23 0921  LABPROT 19.1* 30.9*  INR 1.6* 2.9*    Studies/Results: MR ABDOMEN W WO CONTRAST  Result Date: 10/04/2023 CLINICAL DATA:  Right upper quadrant abdominal pain, IPMN suspected EXAM: MRI ABDOMEN WITHOUT AND WITH CONTRAST TECHNIQUE: Multiplanar multisequence MR imaging of the abdomen was performed both before and after the administration of intravenous contrast. CONTRAST:  10mL GADAVIST GADOBUTROL 1 MMOL/ML IV SOLN COMPARISON:  CT abdomen pelvis, 10/04/2023 FINDINGS: Lower chest: No acute abnormality.  Trace pleural effusions. Hepatobiliary: Coarse contour of the liver. No focal liver lesion. Small gallstones. No gallbladder wall thickening, or biliary dilatation. Pancreas: Tiny fluid signal cystic lesion within the pancreatic body measuring 0.4 cm (series 4, image 16). No solid component or suspicious contrast enhancement no pancreatic ductal dilatation or surrounding inflammatory changes. Spleen: Normal in size without significant abnormality. Adrenals/Urinary Tract: Adrenal glands are unremarkable. Kidneys are normal, without renal calculi, solid lesion, or hydronephrosis. Stomach/Bowel: Stomach is within normal limits. No evidence of bowel wall thickening, distention, or inflammatory changes. Vascular/Lymphatic: Aortic atherosclerosis. Prominent portacaval lymph nodes, likely reactive in the setting of cirrhosis. Other: No abdominal wall hernia or abnormality. Small  volume ascites. Musculoskeletal: No acute or significant osseous findings. IMPRESSION: 1. Coarse contour of the liver, suggestive of cirrhosis. No focal liver lesion. 2. Small volume ascites. 3. Cholelithiasis without evidence of acute cholecystitis. 4. Tiny fluid signal cystic lesion within the pancreatic body measuring 0.4 cm. No solid component or suspicious contrast enhancement. No  pancreatic ductal dilatation or surrounding inflammatory changes. 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 Given tiny size and patient age, no specific further follow-up or characterization is required. 5. Trace pleural effusions. Aortic Atherosclerosis (ICD10-I70.0). Electronically Signed   By: Jearld Lesch M.D.   On: 10/04/2023 21:35   MR 3D Recon At Scanner  Result Date: 10/04/2023 CLINICAL DATA:  Right upper quadrant abdominal pain, IPMN suspected EXAM: MRI ABDOMEN WITHOUT AND WITH CONTRAST TECHNIQUE: Multiplanar multisequence MR imaging of the abdomen was performed both before and after the administration of intravenous contrast. CONTRAST:  10mL GADAVIST GADOBUTROL 1 MMOL/ML IV SOLN COMPARISON:  CT abdomen pelvis, 10/04/2023 FINDINGS: Lower chest: No acute abnormality.  Trace pleural effusions. Hepatobiliary: Coarse contour of the liver. No focal liver lesion. Small gallstones. No gallbladder wall thickening, or biliary dilatation. Pancreas: Tiny fluid signal cystic lesion within the pancreatic body measuring 0.4 cm (series 4, image 16). No solid component or suspicious contrast enhancement no pancreatic ductal dilatation or surrounding inflammatory changes. Spleen: Normal in size without significant abnormality. Adrenals/Urinary Tract: Adrenal glands are unremarkable. Kidneys are normal, without renal calculi, solid lesion, or hydronephrosis. Stomach/Bowel: Stomach is within normal limits. No evidence of bowel wall thickening, distention, or inflammatory changes. Vascular/Lymphatic: Aortic atherosclerosis. Prominent portacaval lymph nodes, likely reactive in the setting of cirrhosis. Other: No abdominal wall hernia or abnormality. Small volume ascites. Musculoskeletal: No acute or significant osseous findings. IMPRESSION: 1. Coarse contour of the liver, suggestive of cirrhosis. No focal liver lesion. 2. Small volume ascites. 3. Cholelithiasis without evidence of acute cholecystitis.  4. Tiny fluid signal cystic lesion within the pancreatic body measuring 0.4 cm. No solid component or suspicious contrast enhancement. No pancreatic ductal dilatation or surrounding inflammatory changes. 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 Given tiny size and patient age, no specific further follow-up or characterization is required. 5. Trace pleural effusions. Aortic Atherosclerosis (ICD10-I70.0). Electronically Signed   By: Jearld Lesch M.D.   On: 10/04/2023 21:35   CT ABDOMEN PELVIS W CONTRAST  Result Date: 10/04/2023 CLINICAL DATA:  Acute onset abdominal pain radiating to the lower back EXAM: CT ABDOMEN AND PELVIS WITH CONTRAST TECHNIQUE: Multidetector CT imaging of the abdomen and pelvis was performed using the standard protocol following bolus administration of intravenous contrast. RADIATION DOSE REDUCTION: This exam was performed according to the departmental dose-optimization program which includes automated exposure control, adjustment of the mA and/or kV according to patient size and/or use of iterative reconstruction technique. CONTRAST:  OMNIPAQUE IOHEXOL 300 MG/ML  SOLN COMPARISON:  CT abdomen and pelvis dated 11/21/2015 FINDINGS: Lower chest: No focal consolidation or pulmonary nodule in the lung bases. No pleural effusion or pneumothorax demonstrated. Partially imaged heart size is normal. Coronary artery calcifications. Hepatobiliary: Cirrhotic morphology. Subcentimeter segment 5/6 hypodensity (2:25), too small to characterize. Mild periportal edema. No intra or extrahepatic biliary ductal dilation. Cholelithiasis. Pancreas: 4 mm hypodensity in the pancreatic body (4:51), too small to characterize. No main pancreatic ductal dilation. Spleen: Normal in size without focal abnormality. Adrenals/Urinary Tract: No adrenal nodules. No suspicious renal mass, calculi or hydronephrosis. No focal bladder wall thickening. Stomach/Bowel: Minimally dilated  fluid-filled  esophagus. Normal appearance of the stomach. Mild circumferential mural thickening of the ascending colon. A few mildly dilated loops of right hemi abdominal small bowel with interspersed areas of mild circumferential mural thickening. Moderate volume stool in the rectum. Colonic diverticulosis without acute diverticulitis. Appendectomy. Vascular/Lymphatic: Aortic atherosclerosis. Upper abdominal varices. 14 mm periportal lymph node (2:26). Reproductive: Prostate is unremarkable. Other: Small volume ascites. No free air or fluid collection. A few scattered punctate calcifications along the inferior right hepatic lobe (2:34), unchanged. Musculoskeletal: No acute or abnormal lytic or blastic osseous lesions. Multilevel degenerative changes of the partially imaged thoracic and lumbar spine. Small fat-containing left inguinal hernia. IMPRESSION: 1. Mild circumferential mural thickening of the ascending colon and a few mildly dilated loops of right hemi abdominal small bowel with interspersed areas of mild circumferential mural thickening may represent infectious/inflammatory enterocolitis or portal enterocolopathy. 2. 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 3. Cholelithiasis. 4. 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 No main ductal dilation, mass lesion, or abnormal enhancement. Follow-up imaging in 2 years can be considered with contrast-enhanced MRI/MRCP if the patient is a surgical candidate. Alternatively, attention on follow-up. 5.  Aortic Atherosclerosis (ICD10-I70.0). Electronically Signed   By: Agustin Cree M.D.   On: 10/04/2023 14:56   DG Chest Port 1 View  Result Date: 10/04/2023 CLINICAL DATA:  Chest pain EXAM: PORTABLE CHEST 1 VIEW COMPARISON:  X-ray 07/16/2015 FINDINGS: No consolidation, pneumothorax or effusion. No edema. Normal  cardiopericardial silhouette. Increasing interstitial changes. Overlapping cardiac leads. Degenerative changes along the spine. IMPRESSION: Increasing interstitial changes of the lungs. Acute versus chronic. Please correlate with clinical presentation and recommend short follow-up. Electronically Signed   By: Karen Kays M.D.   On: 10/04/2023 12:20    Assessment:  77 y.o. year old male  with a history of prior CVA on Plavix (last dose 10/03/23), GERD, HTN, NSTEMI, hypercholesterolemia, COPD, hard of hearing, last seen by our practice in late Oct 2024 to establish care due to nausea, diarrhea, abdominal pain and felt to have self-limiting gastroenteritis presentation. Now presenting to the ED with acutely worsening abdominal pain, new onset chest pain now resolved, nausea, and found to have acutely elevated LFTs and CT findings with cirrhosis, enterocolitis vs colopathy, and pancreatic lesion.   Acutely elevated LFTs/bilirubin:  LFTs previously normal, acutely elevated on admission with AST 194, ALT 120, total bilirubin 4.6.  LFTs trending down with AST 98, ALT 88 today.  Bilirubin increased to 6.9, but down to 3.0 upon recheck this morning.  Considering patient had acute onset abdominal pain yesterday with acutely elevated LFTs that are now downtrending and patient reporting resolution of abdominal pain, suspect patient passed a gallstone through his bile ducts.  MRI was pursued yesterday and did again show gallstones but no biliary dilation.  While patient was also found to have a new diagnosis of cirrhosis this admission, I do not suspect that this is the driver behind his abdominal pain or acutely elevated LFTs; however, serologic workup is underway.  Acute hepatitis panel is negative.  No iron overload. Several other serologies still pending.   Abdominal pain:  Acute onset yesterday prior to presenting to the ER, now completely resolved. Query transient choledocholithiasis, now resolved considering  acutely elevated LFTs and bilirubin which are down trending. Notably, he may have been experiencing biliary colic previously as he has been dealing with intermittent  upper abdominal pain for the last month. There was question of enterocolitis vs colopathy on CT, but patient without symptoms to suggest colitis and MRI yesterday with no bowel wall thickening.   At this point, I suspect gallbladder/biliary etiology of his symptoms. May need to consider cholecystectomy at some point in the future, especially if symptoms return. He will need EGD in the outpatient setting due to cirrhosis and to evaluate his prior upper GI symptoms which also include decreased appetite/early satiety and unintentional weight loss over the last year. Also recommend colonoscopy to follow-up on CT findings.   Leukocytosis/lactic acidosis meeting severe sepsis criteria with AKI and hypoxia: WBC 18.4, lactic acid 3.7, and tachypneic but remaining afebrile on admission.  Associated AKI and hypoxia. Started on IV Zosyn empirically and received IV fluids.  WBC increased to 25.3 today.  Lactic acid trending down yesterday to 3.1. AKI slowly improving. Supplemental O2 discontinued. UA negative.  Blood cultures yesterday with gram-negative rods and aerobic bottles only.   Etiology not entirely clear and possibly multifactorial. May be biliary in etiology, possibly with transient biliary obstruction related to gallstones. Some concern for enterocolitis on CT versus portal colopathy, but no bowel wall thickening noted on MRI and patient with no current symptoms of colitis.  There is concern for respiratory illness in the setting of tachypnea and hypoxia.  Chest x-ray is pending.  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 Acute hepatitis panel is negative.  No iron overload. Several other serologies still pending.  Reported drinking max of 1-2 beers nightly and he has been advised to avoid all alcohol  moving forward.  Clinically, he fairly well compensated with no hepatic encephalopathy, abdominal distention, lower extremity edema, or overt bleeding.  Imaging is noting small volume ascites. Holding off on diuretics for now in light of AKI. INR is elevated, up to 2.9 today, though he is also on heparin.  I have reviewed this with hospitalist who plans to stop heparin.  MELD 3.0 is elevated at 29, driven by elevated INR and elevated bilirubin possible secondary to passage of gallstones as discussed above. Will need to continue to monitor LFTs, INR closely over the coming days. If MELD continues to rise, would need to consider transfer for transplant evaluation, but I suspect labs will improve.  He will need outpatient EGD.   IPMN:  Follow outpatient.   Plan: Stop heparin.  Continue broad-spectrum antibiotics. CBC, CMP, INR daily.  Monitor for HE/abdominal distension/LE edema.  Okay to advance diet as tolerated. Will need 2g/day sodium restriction.  Follow-up on pending serologies for cirrhosis evaluation. Monitor for diarrhea and obtain stool studies if this occurs. Outpatient EGD and colonoscopy.  Complete alcohol avoidance.  Ongoing cirrhosis care outpatient.  Follow IPMN outpatient.    LOS: 1 day    10/05/2023, 12:50 PM   Ermalinda Memos, [facility]

## 2023-10-05 NOTE — Progress Notes (Signed)
PROGRESS NOTE   Mitchell Oliver  FAO:[id number] DOB: [birth year] DOA: 10/04/2023 PCP: [name], DO   Chief Complaint  Patient presents with   Abdominal Pain   Level of care: Telemetry  Brief Admission History:  77 y.o. male with medical history significant for COPD, coronary artery disease, hypertension, CVA, diabetes mellitus, NSTEMI. Patient presented to the ED with complaints of chest pain abdominal pain and back pain.  Reports over the past month he has had abdominal pain across his abdomen, with diarrhea 2-3 times daily.  Diarrhea resolved about a week ago.  His last bowel movement was 2 days ago.  He also reports sharp chest pains across his lower chest, radiating into his abdomen.  No fevers no chills.  Reports increasing difficulty breathing over the past 2 months, he has chronic unchanged cough related to his allergies, and postnasal drip.   ED Course: Temperature 98.2.  Heart rate 50s to 60s.  Respiratory rate 14-26.  Blood pressure systolic 116-133.  O2 sats down to 88% on room air, currently on 2 L sats 92 to 94%. Elevated liver enzymes AST 194, ALT 120, total bilirubin 4.9.  With normal ALP of 94. Leukocytosis of 18.4. Mild elevation in creatinine 1.4. Troponin 9 > 8.  Portable chest x-ray shows increasing interstitial changes of the lungs, acute versus chronic. EDP talked to GI Dr. Levon Hedger, recommended MRCP with see in consult.  IV Zosyn started.   Assessment and Plan:  Elevated liver enzymes Presents with abdominal pain, AST 194, ALT 120, T. bili 4.9 with normal ALP of 94.  Also with leukocytosis of 18.4.  Tachypnea 18-26.  With lactic acid of 3.7, mild AKI.  Afebrile mental status is intact.  CT with findings suggestive of cirrhosis, hypertension, small volume ascites and upper abdominal varices, cholelithiasis. -EDP talked to GI Dr. Levon Hedger, MRCP ordered with liver cirrhosis noted  -Hepatitis work up  -Trend liver enzymes -Hold statins -If increasing ascites may  need to consider paracentesis, to rule out SBP -working closely with GI service   AKI (acute kidney injury)  Mild. Creatinine 1.49, baseline 0.9-1.1. 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  With evidence of endorgan dysfunction, lactic acidosis of 3.7, AKI, and hypoxia.  Source of infection-intra-abdominal pathology- enterocolitis, or liver pathology versus lung infection. -Follow-up blood cultures -ID Dr.Singh recommending ceftazidime/avibactam 2.5 g IV q8 hrs -Trend lactic acid -1.5 L bolus, continue N/s 100cc/hr x 15hrs  Acute hypoxic respiratory failure O2 sats down to 88% on room air.  Not on home O2.  History of COPD.  Chest x-ray showing increased interstitial changes acute versus chronic.  Reports increasing difficulty breathing over the past 2 months, with cough related to his allergies and postnasal drip.  Unremarkable lung exam. -pro-calcitonin elevated at 16.57  -DuoNebs as needed -COVID negative   Enterocolitis Presents with abdominal pain, diarrhea-that appears to have resolved, with leukocytosis, meeting severe sepsis criteria.  CT - mild circumferential mural thickening of the ascending colon, findings may represent infectious/inflammatory enterocolitis or portal enterocolopathy (Pls see detailed report). -IV Zosyn started in ED, but changed on 12/10 to ceftazidime/avibactam 2.5 g IV q8 hrs  COPD with emphysema Clear lung exam.  Hypoxic today.  Not on home O2. -Nebs as needed  CAD S/P percutaneous coronary angioplasty Presents with chest and abdominal pain.  PCI with DES 2016.  Troponin EKG unremarkable today.  Chest pain likely related  to abdominal pathology. -Reports compliance with Plavix  -Hold Statins with elevated liver enzymes  Type 2 diabetes mellitus with other circulatory complications Controlled.  A1c 5.8.  -Hold  Jardiance - Daily fasting CBG CBG (last 3)  Recent Labs    10/04/23 1205 10/05/23 1156 10/05/23 1634  GLUCAP 124* 108* 94   Essential hypertension, benign -Soft to stable -Hold Norvasc, and hold benazepril for contrast exposure -resume metoprolol  DVT prophylaxis:  INR is 2.9 Code Status: Full  Family Communication: wife updated at length 12/10  Disposition: Status is: Inpatient   Consultants:   Procedures:   Antimicrobials:    Subjective: Pt reported that he is feeling a little bit better.   Objective: Vitals:   10/04/23 2025 10/05/23 0417 10/05/23 0813 10/05/23 1402  BP: 116/62 103/68 122/62 129/63  Pulse: (!) 55 (!) 58  67  Resp:   20   Temp: 98.1 F (36.7 C) 98.2 F (36.8 C) 98.1 F (36.7 C) 100.3 F (37.9 C)  TempSrc: Oral Oral Oral Oral  SpO2: 97% 97% 98% 92%  Weight:    106.7 kg  Height:    6\' 2"  (1.88 m)    Intake/Output Summary (Last 24 hours) at 10/05/2023 1914 Last data filed at 10/05/2023 1644 Gross per 24 hour  Intake 3239.82 ml  Output 300 ml  Net 2939.82 ml   Filed Weights   10/05/23 1402  Weight: 106.7 kg   Examination:  General exam: Appears calm and comfortable  Respiratory system: Clear to auscultation. Respiratory effort normal. Cardiovascular system: normal S1 & S2 heard. No JVD, murmurs, rubs, gallops or clicks. No pedal edema. Gastrointestinal system: Abdomen is distended, soft and nontender. No organomegaly or masses felt. Normal bowel sounds heard. Central nervous system: Alert and oriented. No focal neurological deficits. Extremities: Symmetric 5 x 5 power. Skin: mildly jaundiced. Psychiatry: Judgement and insight appear normal. Mood & affect appropriate.   Data Reviewed: I have personally reviewed following labs and imaging studies  CBC: Recent Labs  Lab 10/04/23 1127 10/05/23 0459  WBC 18.4* 25.3*  HGB 17.3* 14.0  HCT 53.7* 42.6  MCV 95.0 93.6  PLT 158 131*    Basic Metabolic Panel: Recent Labs  Lab  10/04/23 1127 10/05/23 0459  NA 137 131*  K 4.5 4.8  CL 106 105  CO2 18* 16*  GLUCOSE 95 69*  BUN 9 15  CREATININE 1.49* 1.33*  CALCIUM 9.5 8.2*    CBG: Recent Labs  Lab 10/04/23 1205 10/05/23 1156 10/05/23 1634  GLUCAP 124* 108* 94    Recent Results (from the past 240 hour(s))  Blood culture (routine x 2)     Status: None (Preliminary result)   Collection Time: 10/04/23  3:25 PM   Specimen: BLOOD  Result Value Ref Range Status   Specimen Description BLOOD RIGHT ANTECUBITAL  Final   Special Requests   Final    BOTTLES DRAWN AEROBIC ONLY Blood Culture adequate volume   Culture  Setup Time   Final    GRAM NEGATIVE RODS AEROBIC BOTTLE ONLY CRITICAL VALUE NOTED.  VALUE IS CONSISTENT WITH PREVIOUSLY REPORTED AND CALLED VALUE. Performed at [facility], [address], [place], [place] [address]    Culture GRAM NEGATIVE RODS  Final   Report Status PENDING  Incomplete  Blood culture (routine x 2)     Status: None (Preliminary result)   Collection Time: 10/04/23  3:25 PM   Specimen: BLOOD  Result Value Ref Range Status   Specimen Description  Final    BLOOD BLOOD LEFT HAND Performed at [facility], [address] [place], [place] [address]    Special Requests   Final    BOTTLES DRAWN AEROBIC ONLY Blood Culture results may not be optimal due to an inadequate volume of blood received in culture bottles Performed at [facility], [address], [place], [place] [address]    Culture  Setup Time   Final    GRAM NEGATIVE RODS AEROBIC BOTTLE ONLY CRITICAL RESULT CALLED TO, READ BACK BY AND VERIFIED WITH: MICHELLE CATES LAND AT 1006 10/05/2023 BY T KENNEDY CRITICAL RESULT CALLED TO, READ BACK BY AND VERIFIED WITH: PHARMD F.WILSON AT 1447 AT 10/05/2023 BY T.SAAD. Performed at [facility], [address] [place], [place] [address]    Culture GRAM NEGATIVE RODS  Final   Report Status PENDING  Incomplete  Blood Culture ID Panel (Reflexed)     Status: Abnormal    Collection Time: 10/04/23  3:25 PM  Result Value Ref Range Status   Enterococcus faecalis NOT DETECTED NOT DETECTED Final   Enterococcus Faecium NOT DETECTED NOT DETECTED Final   Listeria monocytogenes NOT DETECTED NOT DETECTED Final   Staphylococcus species NOT DETECTED NOT DETECTED Final   Staphylococcus aureus (BCID) NOT DETECTED NOT DETECTED Final   Staphylococcus epidermidis NOT DETECTED NOT DETECTED Final   Staphylococcus lugdunensis NOT DETECTED NOT DETECTED Final   Streptococcus species NOT DETECTED NOT DETECTED Final   Streptococcus agalactiae NOT DETECTED NOT DETECTED Final   Streptococcus pneumoniae NOT DETECTED NOT DETECTED Final   Streptococcus pyogenes NOT DETECTED NOT DETECTED Final   A.calcoaceticus-baumannii NOT DETECTED NOT DETECTED Final   Bacteroides fragilis NOT DETECTED NOT DETECTED Final   Enterobacterales DETECTED (A) NOT DETECTED Final    Comment: Enterobacterales represent a large order of gram negative bacteria, not a single organism. Refer to culture for further identification. CRITICAL RESULT CALLED TO, READ BACK BY AND VERIFIED WITH: PHARMD F.WILSON AT 1447 AT 10/05/2023 BY T.SAAD.    Enterobacter cloacae complex NOT DETECTED NOT DETECTED Final   Escherichia coli NOT DETECTED NOT DETECTED Final   Klebsiella aerogenes NOT DETECTED NOT DETECTED Final   Klebsiella oxytoca NOT DETECTED NOT DETECTED Final   Klebsiella pneumoniae NOT DETECTED NOT DETECTED Final   Proteus species NOT DETECTED NOT DETECTED Final   Salmonella species NOT DETECTED NOT DETECTED Final   Serratia marcescens NOT DETECTED NOT DETECTED Final   Haemophilus influenzae NOT DETECTED NOT DETECTED Final   Neisseria meningitidis NOT DETECTED NOT DETECTED Final   Pseudomonas aeruginosa NOT DETECTED NOT DETECTED Final   Stenotrophomonas maltophilia NOT DETECTED NOT DETECTED Final   Candida albicans NOT DETECTED NOT DETECTED Final   Candida auris NOT DETECTED NOT DETECTED Final   Candida  glabrata NOT DETECTED NOT DETECTED Final   Candida krusei NOT DETECTED NOT DETECTED Final   Candida parapsilosis NOT DETECTED NOT DETECTED Final   Candida tropicalis NOT DETECTED NOT DETECTED Final   Cryptococcus neoformans/gattii NOT DETECTED NOT DETECTED Final   CTX-M ESBL NOT DETECTED NOT DETECTED Final   Carbapenem resistance IMP NOT DETECTED NOT DETECTED Final   Carbapenem resistance KPC DETECTED (A) NOT DETECTED Final    Comment: CRITICAL RESULT CALLED TO, READ BACK BY AND VERIFIED WITH: PHARMD F.WILSON AT 1447 AT 10/05/2023 BY T.SAAD. (NOTE) Carbapenemase resistant enterobacterales detected. Recommend ceftazidime/avibactam as intitial therapy.     Carbapenem resistance NDM NOT DETECTED NOT DETECTED Final   Carbapenem resist OXA 48 LIKE NOT DETECTED NOT DETECTED Final  Carbapenem resistance VIM NOT DETECTED NOT DETECTED Final    Comment: Performed at [facility], [address], [place], [place] [address]  SARS Coronavirus 2 by RT PCR (hospital order, performed in [facility] hospital lab) *cepheid single result test* Anterior Nasal Swab     Status: None   Collection Time: 10/04/23  8:18 PM   Specimen: Anterior Nasal Swab  Result Value Ref Range Status   SARS Coronavirus 2 by RT PCR NEGATIVE NEGATIVE Final    Comment: (NOTE) SARS-CoV-2 target nucleic acids are NOT DETECTED.  The SARS-CoV-2 RNA is generally detectable in upper and lower respiratory specimens during the acute phase of infection. The lowest concentration of SARS-CoV-2 viral copies this assay can detect is 250 copies / mL. A negative result does not preclude SARS-CoV-2 infection and should not be used as the sole basis for treatment or other patient management decisions.  A negative result may occur with improper specimen collection / handling, submission of specimen other than nasopharyngeal swab, presence of viral mutation(s) within the areas targeted by this assay, and inadequate number of viral  copies (<250 copies / mL). A negative result must be combined with clinical observations, patient history, and epidemiological information.  Fact Sheet for Patients:   RoadLapTop.co.za  Fact Sheet for Healthcare Providers: http://kim-miller.com/  This test is not yet approved or  cleared by the [place] FDA and has been authorized for detection and/or diagnosis of SARS-CoV-2 by FDA under an Emergency Use Authorization (EUA).  This EUA will remain in effect (meaning this test can be used) for the duration of the COVID-19 declaration under Section 564(b)(1) of the Act, 21 U.S.C. section 360bbb-3(b)(1), unless the authorization is terminated or revoked sooner.  Performed at [facility], [address], [place], [place] [address]   Culture, blood (Routine X 2) w Reflex to ID Panel     Status: None (Preliminary result)   Collection Time: 10/05/23  3:58 PM   Specimen: BLOOD  Result Value Ref Range Status   Specimen Description BLOOD BLOOD RIGHT HAND  Final   Special Requests   Final    BOTTLES DRAWN AEROBIC AND ANAEROBIC Blood Culture results may not be optimal due to an inadequate volume of blood received in culture bottles Performed at [facility], [address], [place], [place] [address]    Culture PENDING  Incomplete   Report Status PENDING  Incomplete  Culture, blood (Routine X 2) w Reflex to ID Panel     Status: None (Preliminary result)   Collection Time: 10/05/23  4:17 PM   Specimen: BLOOD  Result Value Ref Range Status   Specimen Description BLOOD BLOOD RIGHT HAND  Final   Special Requests   Final    BOTTLES DRAWN AEROBIC ONLY Blood Culture results may not be optimal due to an inadequate volume of blood received in culture bottles Performed at [facility], [address] [place], [place] [address]    Culture PENDING  Incomplete   Report Status PENDING  Incomplete    Radiology Studies: DG CHEST PORT 1 VIEW  Result  Date: 10/05/2023 CLINICAL DATA:  Leukocytosis.  Abnormal chest x-ray. EXAM: PORTABLE CHEST 1 VIEW COMPARISON:  October 04, 2023. FINDINGS: Stable cardiomediastinal silhouette. Minimally increased reticular densities are noted throughout both lungs suggesting worsening edema or possibly atypical inflammation. Bony thorax is unremarkable. IMPRESSION: Minimally increased reticular densities are noted bilaterally as described above. Electronically Signed   By: Lupita Raider M.D.   On: 10/05/2023  13:40   MR ABDOMEN W WO CONTRAST  Result Date: 10/04/2023 CLINICAL DATA:  Right upper quadrant abdominal pain, IPMN suspected EXAM: MRI ABDOMEN WITHOUT AND WITH CONTRAST TECHNIQUE: Multiplanar multisequence MR imaging of the abdomen was performed both before and after the administration of intravenous contrast. CONTRAST:  10mL GADAVIST GADOBUTROL 1 MMOL/ML IV SOLN COMPARISON:  CT abdomen pelvis, 10/04/2023 FINDINGS: Lower chest: No acute abnormality.  Trace pleural effusions. Hepatobiliary: Coarse contour of the liver. No focal liver lesion. Small gallstones. No gallbladder wall thickening, or biliary dilatation. Pancreas: Tiny fluid signal cystic lesion within the pancreatic body measuring 0.4 cm (series 4, image 16). No solid component or suspicious contrast enhancement no pancreatic ductal dilatation or surrounding inflammatory changes. Spleen: Normal in size without significant abnormality. Adrenals/Urinary Tract: Adrenal glands are unremarkable. Kidneys are normal, without renal calculi, solid lesion, or hydronephrosis. Stomach/Bowel: Stomach is within normal limits. No evidence of bowel wall thickening, distention, or inflammatory changes. Vascular/Lymphatic: Aortic atherosclerosis. Prominent portacaval lymph nodes, likely reactive in the setting of cirrhosis. Other: No abdominal wall hernia or abnormality. Small volume ascites. Musculoskeletal: No acute or significant osseous findings. IMPRESSION: 1. Coarse contour  of the liver, suggestive of cirrhosis. No focal liver lesion. 2. Small volume ascites. 3. Cholelithiasis without evidence of acute cholecystitis. 4. Tiny fluid signal cystic lesion within the pancreatic body measuring 0.4 cm. No solid component or suspicious contrast enhancement. No pancreatic ductal dilatation or surrounding inflammatory changes. 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 Given tiny size and patient age, no specific further follow-up or characterization is required. 5. Trace pleural effusions. Aortic Atherosclerosis (ICD10-I70.0). Electronically Signed   By: Jearld Lesch M.D.   On: 10/04/2023 21:35   MR 3D Recon At Scanner  Result Date: 10/04/2023 CLINICAL DATA:  Right upper quadrant abdominal pain, IPMN suspected EXAM: MRI ABDOMEN WITHOUT AND WITH CONTRAST TECHNIQUE: Multiplanar multisequence MR imaging of the abdomen was performed both before and after the administration of intravenous contrast. CONTRAST:  10mL GADAVIST GADOBUTROL 1 MMOL/ML IV SOLN COMPARISON:  CT abdomen pelvis, 10/04/2023 FINDINGS: Lower chest: No acute abnormality.  Trace pleural effusions. Hepatobiliary: Coarse contour of the liver. No focal liver lesion. Small gallstones. No gallbladder wall thickening, or biliary dilatation. Pancreas: Tiny fluid signal cystic lesion within the pancreatic body measuring 0.4 cm (series 4, image 16). No solid component or suspicious contrast enhancement no pancreatic ductal dilatation or surrounding inflammatory changes. Spleen: Normal in size without significant abnormality. Adrenals/Urinary Tract: Adrenal glands are unremarkable. Kidneys are normal, without renal calculi, solid lesion, or hydronephrosis. Stomach/Bowel: Stomach is within normal limits. No evidence of bowel wall thickening, distention, or inflammatory changes. Vascular/Lymphatic: Aortic atherosclerosis. Prominent portacaval lymph nodes, likely reactive in the setting of cirrhosis. Other: No abdominal  wall hernia or abnormality. Small volume ascites. Musculoskeletal: No acute or significant osseous findings. IMPRESSION: 1. Coarse contour of the liver, suggestive of cirrhosis. No focal liver lesion. 2. Small volume ascites. 3. Cholelithiasis without evidence of acute cholecystitis. 4. Tiny fluid signal cystic lesion within the pancreatic body measuring 0.4 cm. No solid component or suspicious contrast enhancement. No pancreatic ductal dilatation or surrounding inflammatory changes. 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 Given tiny size and patient age, no specific further follow-up or characterization is required. 5. Trace pleural effusions. Aortic Atherosclerosis (ICD10-I70.0). Electronically Signed   By: Jearld Lesch M.D.   On: 10/04/2023 21:35   CT ABDOMEN PELVIS W CONTRAST  Result Date: 10/04/2023 CLINICAL DATA:  Acute onset abdominal pain radiating to the lower back EXAM: CT ABDOMEN AND PELVIS WITH CONTRAST TECHNIQUE: Multidetector CT imaging of the abdomen and pelvis was performed using the standard protocol following bolus administration of intravenous contrast. RADIATION DOSE REDUCTION: This exam was performed according to the departmental dose-optimization program which includes automated exposure control, adjustment of the mA and/or kV according to patient size and/or use of iterative reconstruction technique. CONTRAST:  OMNIPAQUE IOHEXOL 300 MG/ML  SOLN COMPARISON:  CT abdomen and pelvis dated 11/21/2015 FINDINGS: Lower chest: No focal consolidation or pulmonary nodule in the lung bases. No pleural effusion or pneumothorax demonstrated. Partially imaged heart size is normal. Coronary artery calcifications. Hepatobiliary: Cirrhotic morphology. Subcentimeter segment 5/6 hypodensity (2:25), too small to characterize. Mild periportal edema. No intra or extrahepatic biliary ductal dilation. Cholelithiasis. Pancreas: 4 mm hypodensity in the pancreatic body (4:51), too small  to characterize. No main pancreatic ductal dilation. Spleen: Normal in size without focal abnormality. Adrenals/Urinary Tract: No adrenal nodules. No suspicious renal mass, calculi or hydronephrosis. No focal bladder wall thickening. Stomach/Bowel: Minimally dilated fluid-filled esophagus. Normal appearance of the stomach. Mild circumferential mural thickening of the ascending colon. A few mildly dilated loops of right hemi abdominal small bowel with interspersed areas of mild circumferential mural thickening. Moderate volume stool in the rectum. Colonic diverticulosis without acute diverticulitis. Appendectomy. Vascular/Lymphatic: Aortic atherosclerosis. Upper abdominal varices. 14 mm periportal lymph node (2:26). Reproductive: Prostate is unremarkable. Other: Small volume ascites. No free air or fluid collection. A few scattered punctate calcifications along the inferior right hepatic lobe (2:34), unchanged. Musculoskeletal: No acute or abnormal lytic or blastic osseous lesions. Multilevel degenerative changes of the partially imaged thoracic and lumbar spine. Small fat-containing left inguinal hernia. IMPRESSION: 1. Mild circumferential mural thickening of the ascending colon and a few mildly dilated loops of right hemi abdominal small bowel with interspersed areas of mild circumferential mural thickening may represent infectious/inflammatory enterocolitis or portal enterocolopathy. 2. 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 3. Cholelithiasis. 4. 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 No main ductal dilation, mass lesion, or abnormal enhancement. Follow-up imaging in 2 years can be considered with contrast-enhanced MRI/MRCP if the patient is a surgical candidate. Alternatively, attention on follow-up. 5.  Aortic Atherosclerosis (ICD10-I70.0).  Electronically Signed   By: Agustin Cree M.D.   On: 10/04/2023 14:56   DG Chest Port 1 View  Result Date: 10/04/2023 CLINICAL DATA:  Chest pain EXAM: PORTABLE CHEST 1 VIEW COMPARISON:  X-ray 07/16/2015 FINDINGS: No consolidation, pneumothorax or effusion. No edema. Normal cardiopericardial silhouette. Increasing interstitial changes. Overlapping cardiac leads. Degenerative changes along the spine. IMPRESSION: Increasing interstitial changes of the lungs. Acute versus chronic. Please correlate with clinical presentation and recommend short follow-up. Electronically Signed   By: Karen Kays M.D.   On: 10/04/2023 12:20    Scheduled Meds:  dorzolamide-timolol  1 drop Both Eyes BID   latanoprost  1 drop Both Eyes QHS   [START ON 10/06/2023] metoprolol succinate  25 mg Oral Daily   [START ON 10/06/2023] pantoprazole  40 mg Oral Daily   zinc sulfate (50mg  elemental zinc)  220 mg Oral QPM   Continuous Infusions:  ceftazidime-avibactam (AVYCAZ) 2.5 g in dextrose 5 % 50 mL IVPB      LOS: 1 day   Time spent: 57 mins  Naava Janeway Laural Benes, MD How to contact the [facility] Attending or  Consulting provider 7A - 7P or covering provider during after hours 7P -7A, for this patient?  Check the care team in [facility] and look for a) attending/consulting [facility] provider listed and b) the [facility] team listed Log into [email or web address] to find provider on call.  Locate the [facility] provider you are looking for under [facility] and page to a number that you can be directly reached. If you still have difficulty reaching the provider, please page the [facility] (Director on Call) for the Hospitalists listed on amion for assistance.  10/05/2023, 7:14 PM

## 2023-10-05 NOTE — Hospital Course (Signed)
77 y.o. male with medical history significant for COPD, coronary artery disease, hypertension, CVA, diabetes mellitus, NSTEMI. Patient presented to the ED with complaints of chest pain abdominal pain and back pain.  Reports over the past month he has had abdominal pain across his abdomen, with diarrhea 2-3 times daily.  Diarrhea resolved about a week ago.  His last bowel movement was 2 days ago.  He also reports sharp chest pains across his lower chest, radiating into his abdomen.  No fevers no chills.  Reports increasing difficulty breathing over the past 2 months, he has chronic unchanged cough related to his allergies, and postnasal drip.   ED Course: Temperature 98.2.  Heart rate 50s to 60s.  Respiratory rate 14-26.  Blood pressure systolic 116-133.  O2 sats down to 88% on room air, currently on 2 L sats 92 to 94%. Elevated liver enzymes AST 194, ALT 120, total bilirubin 4.9.  With normal ALP of 94. Leukocytosis of 18.4. Mild elevation in creatinine 1.4. Troponin 9 > 8.  Portable chest x-ray shows increasing interstitial changes of the lungs, acute versus chronic. EDP talked to GI Dr. Levon Hedger, recommended MRCP with see in consult.  IV Zosyn started.

## 2023-10-06 DIAGNOSIS — Z22359 Carrier of enterobacterales, unspecified: Secondary | ICD-10-CM

## 2023-10-06 DIAGNOSIS — A09 Infectious gastroenteritis and colitis, unspecified: Secondary | ICD-10-CM

## 2023-10-06 DIAGNOSIS — R748 Abnormal levels of other serum enzymes: Secondary | ICD-10-CM | POA: Diagnosis not present

## 2023-10-06 LAB — COMPREHENSIVE METABOLIC PANEL
ALT: 66 U/L — ABNORMAL HIGH (ref 0–44)
AST: 61 U/L — ABNORMAL HIGH (ref 15–41)
Albumin: 2.2 g/dL — ABNORMAL LOW (ref 3.5–5.0)
Alkaline Phosphatase: 78 U/L (ref 38–126)
Anion gap: 11 (ref 5–15)
BUN: 19 mg/dL (ref 8–23)
CO2: 18 mmol/L — ABNORMAL LOW (ref 22–32)
Calcium: 8.1 mg/dL — ABNORMAL LOW (ref 8.9–10.3)
Chloride: 100 mmol/L (ref 98–111)
Creatinine, Ser: 1.19 mg/dL (ref 0.61–1.24)
GFR, Estimated: >60 mL/min (ref >60)
Glucose, Bld: 73 mg/dL (ref 70–99)
Potassium: 3.6 mmol/L (ref 3.5–5.1)
Sodium: 129 mmol/L — ABNORMAL LOW (ref 135–145)
Total Bilirubin: 6.2 mg/dL — ABNORMAL HIGH (ref <1.2)
Total Protein: 5.2 g/dL — ABNORMAL LOW (ref 6.5–8.1)

## 2023-10-06 LAB — CERULOPLASMIN: Ceruloplasmin: 18.1 mg/dL (ref 16.0–31.0)

## 2023-10-06 LAB — CBC WITH DIFFERENTIAL/PLATELET
Abs Immature Granulocytes: 0.06 10*3/uL (ref 0.00–0.07)
Basophils Absolute: 0.1 10*3/uL (ref 0.0–0.1)
Basophils Relative: 0 %
Eosinophils Absolute: 0.2 10*3/uL (ref 0.0–0.5)
Eosinophils Relative: 2 %
HCT: 38.1 % — ABNORMAL LOW (ref 39.0–52.0)
Hemoglobin: 12.9 g/dL — ABNORMAL LOW (ref 13.0–17.0)
Immature Granulocytes: 1 %
Lymphocytes Relative: 8 %
Lymphs Abs: 1 10*3/uL (ref 0.7–4.0)
MCH: 31 pg (ref 26.0–34.0)
MCHC: 33.9 g/dL (ref 30.0–36.0)
MCV: 91.6 fL (ref 80.0–100.0)
Monocytes Absolute: 0.5 10*3/uL (ref 0.1–1.0)
Monocytes Relative: 4 %
Neutro Abs: 10.7 10*3/uL — ABNORMAL HIGH (ref 1.7–7.7)
Neutrophils Relative %: 85 %
Platelets: 100 10*3/uL — ABNORMAL LOW (ref 150–400)
RBC: 4.16 MIL/uL — ABNORMAL LOW (ref 4.22–5.81)
RDW: 14.8 % (ref 11.5–15.5)
WBC: 12.5 10*3/uL — ABNORMAL HIGH (ref 4.0–10.5)
nRBC: 0 % (ref 0.0–0.2)

## 2023-10-06 LAB — GLUCOSE, CAPILLARY
Glucose-Capillary: 103 mg/dL — ABNORMAL HIGH (ref 70–99)
Glucose-Capillary: 110 mg/dL — ABNORMAL HIGH (ref 70–99)
Glucose-Capillary: 88 mg/dL (ref 70–99)
Glucose-Capillary: 91 mg/dL (ref 70–99)
Glucose-Capillary: 93 mg/dL (ref 70–99)

## 2023-10-06 LAB — PROTIME-INR
INR: 1.9 — ABNORMAL HIGH (ref 0.8–1.2)
Prothrombin Time: 21.8 s — ABNORMAL HIGH (ref 11.4–15.2)

## 2023-10-06 LAB — ENA+DNA/DS+ANTICH+CENTRO+JO...
Anti JO-1: <0.2 AI (ref 0.0–0.9)
Centromere Ab Screen: <0.2 AI (ref 0.0–0.9)
Chromatin Ab SerPl-aCnc: <0.2 AI (ref 0.0–0.9)
ENA SM Ab Ser-aCnc: <0.2 AI (ref 0.0–0.9)
Ribonucleic Protein: 1.3 AI — ABNORMAL HIGH (ref 0.0–0.9)
SSA (Ro) (ENA) Antibody, IgG: <0.2 AI (ref 0.0–0.9)
SSB (La) (ENA) Antibody, IgG: <0.2 AI (ref 0.0–0.9)
Scleroderma (Scl-70) (ENA) Antibody, IgG: <0.2 AI (ref 0.0–0.9)
ds DNA Ab: 2 [IU]/mL (ref 0–9)

## 2023-10-06 LAB — ANA W/REFLEX IF POSITIVE: Anti Nuclear Antibody (ANA): POSITIVE — AB

## 2023-10-06 LAB — IGG, IGA, IGM
IgA: 656 mg/dL — ABNORMAL HIGH (ref 61–437)
IgG (Immunoglobin G), Serum: 1241 mg/dL (ref 603–1613)
IgM (Immunoglobulin M), Srm: 138 mg/dL (ref 15–143)

## 2023-10-06 LAB — LIPASE, BLOOD: Lipase: 55 U/L — ABNORMAL HIGH (ref 11–51)

## 2023-10-06 NOTE — Consult Note (Signed)
Virtual Visit via Telephone/Video Note   I connected with Mitchell Oliver   On 10/06/2023 at 10:59 AM  by Video and verified that I am speaking with the correct person using two identifiers.   I discussed the limitations, risks, security and privacy concerns of performing an evaluation and management service by telephone and the availability of in person appointments. I also discussed with the patient that there may be a patient responsible charge related to this service. The patient expressed understanding and agreed to proceed.   Location:   Patient: [facility] Provider: Lucien Mons    Date of Admission:  10/04/2023   Total days of inpatient antibiotics 2        Reason for Consult: KPC bacteremia    Principal Problem:   Elevated liver enzymes Active Problems:   Essential hypertension, benign   Type 2 diabetes mellitus with other circulatory complications (HCC)   CAD S/P percutaneous coronary angioplasty   COPD with emphysema (HCC)   Enterocolitis   Acute hypoxic respiratory failure (HCC)   Severe sepsis (HCC)   AKI (acute kidney injury) [facility])   Assessment: 77 year old male with history of CVA on Plavix, GERD, hypertension, NSTEMI hypercholesteremia, COPD, chronic diarrhea for about a month and a half with abdominal pain admitted with: #Enterobacterales, KPC producing bacteremia likely secondary to GI source #Chronic diarrhea #Transaminitis-improving #Elevated T. bili - Patient presented with worsening abdominal pain, chest pain and back pain.  Last bowel movement was 2 days prior to admission.  On arrival temp of 98.2, WBC 20 5K, ALT/AST 120/194, T. bili 4.9, ALP 94.  CT on admission showed mural thickening of ascending colon with few mildly dilated loops which could be inflammatory/infectious enterocolitis, cirrhotic morphology of portal hypertension, upper abdominal varices, cholelithiasis, 4 mm focus in pancreas recommended follow-up imaging in 2 years. - MRCP suggestive of cirrhosis,  cholelithiasis without acute cholecystitis no further characterization required for cystic lesion of pancreas. - GI consulted and noted that less likely that abdominal pain started as BP.  Consider diagnostic paracentesis as ascites present.  Explained pathogenesis, prognosis and treatment for liver cirrhosis.  May undergo EGD outpatient.  Will colonoscopy inpatient clinic clinical progression. - I spoke to patient and daughters(the phone) and they noted that he has been on antibiotics about a couple times a year for the past 10 years or so for sinus infections.  Antibiotics initially were Augmentin then transition to Levaquin.  They noted Augmentin stop working.  I think okay to to continue Avycaz at this point.  He does seem lower risk for KPC organism.  Recommendations:  --Continue Avycaz - Hep panel noted to be negative - ANA positive with RNP positive(mildly 1.3)-> unclear if this is leading to cirrhotic picture.  Of note patient has been drinking 1-2 beers almost every night. - Blood cultures for speciation.  Blood cultures reported prior to epic has been started as well. Microbiology:   Antibiotics: Pip-tazo 12/9 - 12/10 Avycaz 12/10-present   Cultures: Blood 12/92/2 GNR with BC ID Enterobacterales, K PC - 12/10 Urine  Other   HPI: Mitchell Oliver is a 77 y.o. male with history of CVA, NSTEMI, COPD and hypertension Plavix presented to [facility], ED with chronic abdominal pain and nausea.  Noted to have intermittent upper abdominal pain and watery diarrhea for few weeks.  Noted to have transitive nidus and T. bili of 4.6.  MRCP showed cholelithiasis, normal gallbladder.  No evidence of choledocholithiasis.  ID engaged as [facility] ID showed Enterobacter Alice  with KBC gene.  Patient initially on pip-tazo switched to Avycaz. - Spoke to patient and his 2 daughters present over the phone.  They noted that he has been on Augmentin then Levaquin about twice a year for 10 years for sinus  infections.  No outside the [place] travel.   Review of Systems: Review of Systems  All other systems reviewed and are negative.   Past Medical History:  Diagnosis Date   Allergy    COPD (chronic obstructive pulmonary disease) (HCC)    CVA (cerebral vascular accident) (HCC) 2012   denies residual on 07/22/2015   ED (erectile dysfunction)    Family history of adverse reaction to anesthesia    "think my mother had real bad headaches after anesthesia"   GERD (gastroesophageal reflux disease)    Hypercholesterolemia    Hypertension    IFG (impaired fasting glucose)    NSTEMI (non-ST elevated myocardial infarction) (HCC)    Pneumonia ~ 2005    Social History   Tobacco Use   Smoking status: Former    Current packs/day: 0.00    Average packs/day: 2.0 packs/day for 24.2 years (48.3 ttl pk-yrs)    Types: Cigarettes    Start date: 20    Quit date: 12/24/1985    Years since quitting: 37.8   Smokeless tobacco: Never  Vaping Use   Vaping status: Never Used  Substance Use Topics   Alcohol use: Yes    Alcohol/week: 6.0 standard drinks of alcohol    Types: 2 Glasses of wine, 4 Cans of beer per week   Drug use: No    Family History  Problem Relation Age of Onset   Hypertension Mother    Hypertension Father    Heart attack Father    Stroke Father    Colon cancer Neg Hx    Colon polyps Neg Hx    Scheduled Meds:  dorzolamide-timolol  1 drop Both Eyes BID   latanoprost  1 drop Both Eyes QHS   metoprolol succinate  25 mg Oral Daily   pantoprazole  40 mg Oral Daily   zinc sulfate (50mg  elemental zinc)  220 mg Oral QPM   Continuous Infusions:  ceftazidime-avibactam (AVYCAZ) 2.5 g in dextrose 5 % 50 mL IVPB 2.5 g (10/06/23 0536)   PRN Meds:.fluticasone, ipratropium-albuterol, ondansetron **OR** ondansetron (ZOFRAN) IV, polyethylene glycol, tiZANidine Allergies  Allergen Reactions   Lidocaine Hives    Rash    OBJECTIVE: Blood pressure (!) 153/80, pulse (!) 53, temperature 98.1  F (36.7 C), temperature source Oral, resp. rate 20, height 6\' 2"  (1.88 m), weight 106.7 kg, SpO2 93%.  Jaundiced  Lab Results Lab Results  Component Value Date   WBC 12.5 (H) 10/06/2023   HGB 12.9 (L) 10/06/2023   HCT 38.1 (L) 10/06/2023   MCV 91.6 10/06/2023   PLT 100 (L) 10/06/2023    Lab Results  Component Value Date   CREATININE 1.19 10/06/2023   BUN 19 10/06/2023   NA 129 (L) 10/06/2023   K 3.6 10/06/2023   CL 100 10/06/2023   CO2 18 (L) 10/06/2023    Lab Results  Component Value Date   ALT 66 (H) 10/06/2023   AST 61 (H) 10/06/2023   ALKPHOS 78 10/06/2023   BILITOT 6.2 (H) 10/06/2023       Danelle Earthly, MD [facility] 10/06/2023, 10:49 AM   I have personally spent 82 minutes involved in face-to-face and non-face-to-face activities for this patient on the  day of the visit. Professional time spent includes the following activities: Preparing to see the patient (review of tests), Obtaining and/or reviewing separately obtained history (admission/discharge record), Performing a medically appropriate examination and/or evaluation , Ordering medications/tests/procedures, referring and communicating with other health care professionals, Documenting clinical information in the EMR, Independently interpreting results (not separately reported), Communicating results to the patient/family/caregiver, Counseling and educating the patient/family/caregiver and Care coordination (not separately reported).

## 2023-10-06 NOTE — Progress Notes (Signed)
Subjective: Patient feeling okay this morning. Denies any nausea, vomiting or abdominal pain. Family at bedside states he did spike a fever around 102 last night. Afebrile this morning. Has had an episode of diarrhea this morning after antibiotic was changed yesterday.   Objective: Vital signs in last 24 hours: Temp:  [98.1 F (36.7 C)-102.1 F (38.9 C)] 98.1 F (36.7 C) (12/11 0524) Pulse Rate:  [46-67] 57 (12/11 0524) Resp:  [20] 20 (12/10 2153) BP: (109-140)/(50-77) 140/70 (12/11 0524) SpO2:  [91 %-93 %] 93 % (12/11 0524) Weight:  [106.7 kg] 106.7 kg (12/10 1402) Last BM Date : 10/05/23 General:   Alert and oriented, pleasant Head:  Normocephalic and atraumatic. Eyes:  No icterus, sclera clear. Conjuctiva pink.  Heart:  S1, S2 present, no murmurs noted.  Lungs: Clear to auscultation bilaterally, without wheezing, rales, or rhonchi.  Abdomen:  Bowel sounds present, soft, non-tender, non-distended. No HSM or hernias noted. No rebound or guarding. No masses appreciated  Msk:  Symmetrical without gross deformities. Normal posture. Extremities:  Without clubbing or edema. Neurologic:  Alert and  oriented x4;  grossly normal neurologically. Skin:  Warm and dry, intact without significant lesions.  Psych:  Alert and cooperative. Normal mood and affect.  Intake/Output from previous day: 12/10 0701 - 12/11 0700 In: 2363.2 [P.O.:476; I.V.:498; IV Piggyback:1389.2] Out: 1000 [Urine:1000] Intake/Output this shift: No intake/output data recorded.  Lab Results: Recent Labs    10/04/23 1127 10/05/23 0459 10/06/23 0457  WBC 18.4* 25.3* 12.5*  HGB 17.3* 14.0 12.9*  HCT 53.7* 42.6 38.1*  PLT 158 131* 100*   BMET Recent Labs    10/04/23 1127 10/05/23 0459 10/06/23 0457  NA 137 131* 129*  K 4.5 4.8 3.6  CL 106 105 100  CO2 18* 16* 18*  GLUCOSE 95 69* 73  BUN 9 15 19   CREATININE 1.49* 1.33* 1.19  CALCIUM 9.5 8.2* 8.1*   LFT Recent Labs    10/04/23 1127 10/05/23 0459  10/05/23 0921 10/06/23 0457  PROT 6.1* 5.3*  --  5.2*  ALBUMIN 2.8* 2.4*  --  2.2*  AST 194* 98*  --  61*  ALT 120* 88*  --  66*  ALKPHOS 94 72  --  78  BILITOT 4.6* 6.9* 3.0* 6.2*  BILIDIR  --   --  1.9*  --   IBILI  --   --  1.1*  --    PT/INR Recent Labs    10/05/23 0921 10/06/23 0457  LABPROT 30.9* 21.8*  INR 2.9* 1.9*   Hepatitis Panel Recent Labs    10/04/23 1851  HEPBSAG NON REACTIVE  HCVAB NON REACTIVE  HEPAIGM NON REACTIVE  HEPBIGM NON REACTIVE    Studies/Results: DG CHEST PORT 1 VIEW  Result Date: 10/05/2023 CLINICAL DATA:  Leukocytosis.  Abnormal chest x-ray. EXAM: PORTABLE CHEST 1 VIEW COMPARISON:  October 04, 2023. FINDINGS: Stable cardiomediastinal silhouette. Minimally increased reticular densities are noted throughout both lungs suggesting worsening edema or possibly atypical inflammation. Bony thorax is unremarkable. IMPRESSION: Minimally increased reticular densities are noted bilaterally as described above. Electronically Signed   By: Lupita Raider M.D.   On: 10/05/2023 13:40   MR ABDOMEN W WO CONTRAST  Result Date: 10/04/2023 CLINICAL DATA:  Right upper quadrant abdominal pain, IPMN suspected EXAM: MRI ABDOMEN WITHOUT AND WITH CONTRAST TECHNIQUE: Multiplanar multisequence MR imaging of the abdomen was performed both before and after the administration of intravenous contrast. CONTRAST:  10mL GADAVIST GADOBUTROL 1 MMOL/ML IV SOLN COMPARISON:  CT  abdomen pelvis, 10/04/2023 FINDINGS: Lower chest: No acute abnormality.  Trace pleural effusions. Hepatobiliary: Coarse contour of the liver. No focal liver lesion. Small gallstones. No gallbladder wall thickening, or biliary dilatation. Pancreas: Tiny fluid signal cystic lesion within the pancreatic body measuring 0.4 cm (series 4, image 16). No solid component or suspicious contrast enhancement no pancreatic ductal dilatation or surrounding inflammatory changes. Spleen: Normal in size without significant  abnormality. Adrenals/Urinary Tract: Adrenal glands are unremarkable. Kidneys are normal, without renal calculi, solid lesion, or hydronephrosis. Stomach/Bowel: Stomach is within normal limits. No evidence of bowel wall thickening, distention, or inflammatory changes. Vascular/Lymphatic: Aortic atherosclerosis. Prominent portacaval lymph nodes, likely reactive in the setting of cirrhosis. Other: No abdominal wall hernia or abnormality. Small volume ascites. Musculoskeletal: No acute or significant osseous findings. IMPRESSION: 1. Coarse contour of the liver, suggestive of cirrhosis. No focal liver lesion. 2. Small volume ascites. 3. Cholelithiasis without evidence of acute cholecystitis. 4. Tiny fluid signal cystic lesion within the pancreatic body measuring 0.4 cm. No solid component or suspicious contrast enhancement. No pancreatic ductal dilatation or surrounding inflammatory changes. 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 Given tiny size and patient age, no specific further follow-up or characterization is required. 5. Trace pleural effusions. Aortic Atherosclerosis (ICD10-I70.0). Electronically Signed   By: Jearld Lesch M.D.   On: 10/04/2023 21:35   MR 3D Recon At Scanner  Result Date: 10/04/2023 CLINICAL DATA:  Right upper quadrant abdominal pain, IPMN suspected EXAM: MRI ABDOMEN WITHOUT AND WITH CONTRAST TECHNIQUE: Multiplanar multisequence MR imaging of the abdomen was performed both before and after the administration of intravenous contrast. CONTRAST:  10mL GADAVIST GADOBUTROL 1 MMOL/ML IV SOLN COMPARISON:  CT abdomen pelvis, 10/04/2023 FINDINGS: Lower chest: No acute abnormality.  Trace pleural effusions. Hepatobiliary: Coarse contour of the liver. No focal liver lesion. Small gallstones. No gallbladder wall thickening, or biliary dilatation. Pancreas: Tiny fluid signal cystic lesion within the pancreatic body measuring 0.4 cm (series 4, image 16). No solid component or  suspicious contrast enhancement no pancreatic ductal dilatation or surrounding inflammatory changes. Spleen: Normal in size without significant abnormality. Adrenals/Urinary Tract: Adrenal glands are unremarkable. Kidneys are normal, without renal calculi, solid lesion, or hydronephrosis. Stomach/Bowel: Stomach is within normal limits. No evidence of bowel wall thickening, distention, or inflammatory changes. Vascular/Lymphatic: Aortic atherosclerosis. Prominent portacaval lymph nodes, likely reactive in the setting of cirrhosis. Other: No abdominal wall hernia or abnormality. Small volume ascites. Musculoskeletal: No acute or significant osseous findings. IMPRESSION: 1. Coarse contour of the liver, suggestive of cirrhosis. No focal liver lesion. 2. Small volume ascites. 3. Cholelithiasis without evidence of acute cholecystitis. 4. Tiny fluid signal cystic lesion within the pancreatic body measuring 0.4 cm. No solid component or suspicious contrast enhancement. No pancreatic ductal dilatation or surrounding inflammatory changes. 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 Given tiny size and patient age, no specific further follow-up or characterization is required. 5. Trace pleural effusions. Aortic Atherosclerosis (ICD10-I70.0). Electronically Signed   By: Jearld Lesch M.D.   On: 10/04/2023 21:35   CT ABDOMEN PELVIS W CONTRAST  Result Date: 10/04/2023 CLINICAL DATA:  Acute onset abdominal pain radiating to the lower back EXAM: CT ABDOMEN AND PELVIS WITH CONTRAST TECHNIQUE: Multidetector CT imaging of the abdomen and pelvis was performed using the standard protocol following bolus administration of intravenous contrast. RADIATION DOSE REDUCTION: This exam was performed according to the departmental dose-optimization program which includes automated exposure control, adjustment of the mA  and/or kV according to patient size and/or use of iterative reconstruction technique. CONTRAST:   OMNIPAQUE IOHEXOL 300 MG/ML  SOLN COMPARISON:  CT abdomen and pelvis dated 11/21/2015 FINDINGS: Lower chest: No focal consolidation or pulmonary nodule in the lung bases. No pleural effusion or pneumothorax demonstrated. Partially imaged heart size is normal. Coronary artery calcifications. Hepatobiliary: Cirrhotic morphology. Subcentimeter segment 5/6 hypodensity (2:25), too small to characterize. Mild periportal edema. No intra or extrahepatic biliary ductal dilation. Cholelithiasis. Pancreas: 4 mm hypodensity in the pancreatic body (4:51), too small to characterize. No main pancreatic ductal dilation. Spleen: Normal in size without focal abnormality. Adrenals/Urinary Tract: No adrenal nodules. No suspicious renal mass, calculi or hydronephrosis. No focal bladder wall thickening. Stomach/Bowel: Minimally dilated fluid-filled esophagus. Normal appearance of the stomach. Mild circumferential mural thickening of the ascending colon. A few mildly dilated loops of right hemi abdominal small bowel with interspersed areas of mild circumferential mural thickening. Moderate volume stool in the rectum. Colonic diverticulosis without acute diverticulitis. Appendectomy. Vascular/Lymphatic: Aortic atherosclerosis. Upper abdominal varices. 14 mm periportal lymph node (2:26). Reproductive: Prostate is unremarkable. Other: Small volume ascites. No free air or fluid collection. A few scattered punctate calcifications along the inferior right hepatic lobe (2:34), unchanged. Musculoskeletal: No acute or abnormal lytic or blastic osseous lesions. Multilevel degenerative changes of the partially imaged thoracic and lumbar spine. Small fat-containing left inguinal hernia. IMPRESSION: 1. Mild circumferential mural thickening of the ascending colon and a few mildly dilated loops of right hemi abdominal small bowel with interspersed areas of mild circumferential mural thickening may represent infectious/inflammatory enterocolitis or  portal enterocolopathy. 2. 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 3. Cholelithiasis. 4. 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 No main ductal dilation, mass lesion, or abnormal enhancement. Follow-up imaging in 2 years can be considered with contrast-enhanced MRI/MRCP if the patient is a surgical candidate. Alternatively, attention on follow-up. 5.  Aortic Atherosclerosis (ICD10-I70.0). Electronically Signed   By: Agustin Cree M.D.   On: 10/04/2023 14:56   DG Chest Port 1 View  Result Date: 10/04/2023 CLINICAL DATA:  Chest pain EXAM: PORTABLE CHEST 1 VIEW COMPARISON:  X-ray 07/16/2015 FINDINGS: No consolidation, pneumothorax or effusion. No edema. Normal cardiopericardial silhouette. Increasing interstitial changes. Overlapping cardiac leads. Degenerative changes along the spine. IMPRESSION: Increasing interstitial changes of the lungs. Acute versus chronic. Please correlate with clinical presentation and recommend short follow-up. Electronically Signed   By: Karen Kays M.D.   On: 10/04/2023 12:20    Assessment: 77 year old male  with a history of prior CVA on Plavix (last dose 10/03/23), GERD, HTN, NSTEMI, hypercholesterolemia, COPD, hard of hearing, last seen by our practice in late Oct 2024 to establish care due to nausea, diarrhea, abdominal pain and felt to have self-limiting gastroenteritis presentation. Now presenting to the ED with acutely worsening abdominal pain, new onset chest pain now resolved, nausea, and found to have acutely elevated LFTs and CT findings with cirrhosis, enterocolitis vs colopathy, and pancreatic lesion.    Acutely elevated LFTs/bilirubin:  LFTs previously normal, acutely elevated on admission with AST 194, ALT 120, total bilirubin 4.6.  LFTs continue to trend down.  Bilirubin increased to 6.9,  down to  3.0 upon recheck yesterday but back up to 6.2 this morning. Lipase also now mildly elevated at 55. Considering patient had acute onset abdominal pain yesterday with acutely elevated LFTs that are  now downtrending and patient reporting resolution of abdominal pain, suspect patient passed a gallstone through his bile ducts.  MRI Monday showed gallstones but no biliary dilation.  While patient was also found to have a new diagnosis of cirrhosis this admission,less likely this is driving his elevated LFTs or abdominal pain, however, serologic workup is underway.  Acute hepatitis panel is negative.  No iron overload. ANA positie but awaiting reflex. Several other serologies still pending.    Abdominal pain:  Acute onset Monday. Query transient choledocholithiasis, now resolved considering acutely elevated LFTs and bilirubin which are down trending. Notably, he may have been experiencing biliary colic previously as he has been dealing with intermittent upper abdominal pain for the last month. There was question of enterocolitis vs colopathy on CT, but patient without symptoms to suggest colitis and MRI yesterday with no bowel wall thickening.    At this point,  gallbladder/biliary etiology of his symptoms is most likely. General surgery has seen patient and does not feel that urgent cholecystectomy is warranted, though May need to consider cholecystectomy at some point in the future, especially if symptoms return. He will need EGD in the outpatient setting due to cirrhosis and to evaluate his prior UGI symptoms which also include decreased appetite/early satiety and unintentional weight loss over the last year. Also recommend colonoscopy to follow-up on CT findings.    Leukocytosis/lactic acidosis meeting severe sepsis criteria with AKI and hypoxia: WBC peaked at 25.3 yesterday, lactic acid 3.7,  afebrile on admission though did spike a fever last night.  Associated AKI and hypoxia. on IV Avycaz for  Enterobacterales in blood culture ID panel, ID on board. Lactic acid trended down to 3.1. AKI slowly improving.  UA negative.  Blood cultures yesterday with no growth.    Etiology likely multifactorial though ID panel positive for Enterobacterales which may be the driving force. Some concern for enterocolitis on CT versus portal colopathy, but no bowel wall thickening noted on MRI. Did note an episode of diarrhea after antibiotic therapy was changed but no clinical signs of colitis prior to.  There is concern for respiratory illness in the setting of tachypnea and hypoxia.  Chest x-ray yesterday with possible worsening edema or atypical inflammation.   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 Acute hepatitis panel is negative.  No iron overload.  Reported drinking max of 1-2 beers nightly and he has been advised to avoid all alcohol moving forward.  Clinically, he fairly well compensated with no HE, abdominal distention, lower extremity edema, or overt bleeding.  Imaging is noting small volume ascites. Holding off on diuretics for now in light of AKI. INR was elevated, up to 2.9 yesterday, though 1.9 today. MELD 3.0 today is 27 (29 yesterday) though driven by elevated INR and elevated bilirubin possible secondary to passage of gallstones as discussed above. Will need to continue to monitor LFTs, INR closely over the coming days. If MELD continues to rise, would need to consider transfer for transplant evaluation, but I suspect labs will improve.  He will need outpatient EGD.    IPMN:  Follow outpatient.    Plan: MELD labs daily Follow for liver serologies Monitor for HE/abdominal distention 2g sodium diet Outpatient EGD/colonoscopy Complete ETOH avoidance Ongoing outpatient cirrhosis care Follow IPMN outpatient  Continue antibiotics per ID   LOS: 2 days    10/06/2023, 8:48 AM   Coron Rossano L. Jeanmarie Hubert, MSN, APRN, AGNP-C Adult-Gerontology Nurse  Practitioner [facility]

## 2023-10-06 NOTE — Progress Notes (Signed)
PROGRESS NOTE    Mitchell Oliver  QMV:[id number] DOB: 12-Sep-1946 DOA: 10/04/2023 PCP: [name], DO   Brief Narrative:    77 y.o. male with medical history significant for COPD, coronary artery disease, hypertension, CVA, diabetes mellitus, NSTEMI. Patient presented to the ED with complaints of chest pain abdominal pain and back pain.  Patient was admitted for severe sepsis from Enterobacter bacteremia along with elevated liver enzymes of unknown etiology, possibly related to sepsis physiology.  Assessment & Plan:   Principal Problem:   Elevated liver enzymes Active Problems:   Enterocolitis   Acute hypoxic respiratory failure (HCC)   Severe sepsis (HCC)   AKI (acute kidney injury) (HCC)   CAD S/P percutaneous coronary angioplasty   Essential hypertension, benign   Type 2 diabetes mellitus with other circulatory complications (HCC)   COPD with emphysema (HCC)  Assessment and Plan:   Elevated liver enzymes Presents with abdominal pain, AST 194, ALT 120, T. bili 4.9 with normal ALP of 94.  Also with leukocytosis of 18.4.  Tachypnea 18-26.  With lactic acid of 3.7, mild AKI.  Afebrile mental status is intact.  CT with findings suggestive of cirrhosis, hypertension, small volume ascites and upper abdominal varices, cholelithiasis. -EDP talked to GI Dr. Levon Hedger, MRCP ordered with liver cirrhosis noted  -Hepatitis work up negative -Trend liver enzymes -Hold statins -If increasing ascites may need to consider paracentesis, to rule out SBP -working closely with GI service  -Appreciate general surgery recommendations with no need for any intervention noted at this time.  Diet advanced to heart healthy   AKI (acute kidney injury) -resolved -Hydrate, hold lisinopril   Severe sepsis from enterobacterales (carbapenem resistance) Meeting severe sepsis criteria with leukocytosis of 18.4, and tachypnea respirate rate 14-26.  With evidence of endorgan dysfunction, lactic acidosis of  3.7, AKI, and hypoxia.  Source of infection-intra-abdominal pathology- enterocolitis, or liver pathology versus lung infection. -Follow-up blood cultures -ID Dr.Singh recommending ceftazidime/avibactam 2.5 g IV q8 hrs -Trend lactic acid in am -1.5 L bolus, continue N/s 100cc/hr x 15hrs   Acute hypoxic respiratory failure O2 sats down to 88% on room air.  Not on home O2.  History of COPD.  Chest x-ray showing increased interstitial changes acute versus chronic.  Reports increasing difficulty breathing over the past 2 months, with cough related to his allergies and postnasal drip.  Unremarkable lung exam. -pro-calcitonin elevated at 16.57  -DuoNebs as needed -COVID negative    Enterocolitis Presents with abdominal pain, diarrhea-that appears to have resolved, with leukocytosis, meeting severe sepsis criteria.  CT - mild circumferential mural thickening of the ascending colon, findings may represent infectious/inflammatory enterocolitis or portal enterocolopathy (Pls see detailed report). -IV Zosyn started in ED, but changed on 12/10 to ceftazidime/avibactam 2.5 g IV q8 hrs   COPD with emphysema Clear lung exam.  Hypoxic today.  Not on home O2. -Nebs as needed   CAD S/P percutaneous coronary angioplasty Presents with chest and abdominal pain.  PCI with DES 2016.  Troponin EKG unremarkable today.  Chest pain likely related to abdominal pathology. -Reports compliance with Plavix  -Hold Statins with elevated liver enzymes   Type 2 diabetes mellitus with other circulatory complications Controlled.  A1c 5.8.  -Hold Jardiance - Daily fasting CBG  Essential hypertension, benign -Soft to stable -Hold Norvasc, and hold benazepril for contrast exposure -resume metoprolol   Obesity BMI 30.2   DVT prophylaxis: Elevated INR, SCDs Code Status: Full Family Communication: Significant other at bedside Disposition Plan:  Status is: Inpatient Remains inpatient appropriate because: Need for IV  medications   Consultants:  GI ID GS  Procedures:  None  Antimicrobials:  Anti-infectives (From admission, onward)    Start     Dose/Rate Route Frequency Ordered Stop   10/05/23 1800  ceftazidime-avibactam (AVYCAZ) 2.5 g in dextrose 5 % 50 mL IVPB        2.5 g 25 mL/hr over 2 Hours Intravenous Every 8 hours 10/05/23 1514     10/04/23 2200  piperacillin-tazobactam (ZOSYN) IVPB 3.375 g  Status:  Discontinued        3.375 g 12.5 mL/hr over 240 Minutes Intravenous Every 8 hours 10/04/23 1536 10/05/23 1514   10/04/23 1815  azithromycin (ZITHROMAX) 500 mg in sodium chloride 0.9 % 250 mL IVPB  Status:  Discontinued        500 mg 250 mL/hr over 60 Minutes Intravenous Every 24 hours 10/04/23 1800 10/05/23 0756   10/04/23 1530  piperacillin-tazobactam (ZOSYN) IVPB 3.375 g        3.375 g 100 mL/hr over 30 Minutes Intravenous  Once 10/04/23 1527 10/04/23 1607       Subjective: Patient seen and evaluated today and is overall feeling better and ready for dietary advancement.  Denies abdominal pain, nausea, or vomiting..  Objective: Vitals:   10/06/23 0212 10/06/23 0524 10/06/23 0914 10/06/23 1239  BP: 127/77 (!) 140/70 (!) 153/80 (!) 153/88  Pulse: 62 (!) 57 (!) 53 (!) 57  Resp:    (!) 21  Temp: 98.4 F (36.9 C) 98.1 F (36.7 C)  98.6 F (37 C)  TempSrc: Oral Oral    SpO2: 93% 93%  93%  Weight:      Height:        Intake/Output Summary (Last 24 hours) at 10/06/2023 1300 Last data filed at 10/06/2023 0900 Gross per 24 hour  Intake 464.71 ml  Output 1000 ml  Net -535.29 ml   Filed Weights   10/05/23 1402  Weight: 106.7 kg    Examination:  General exam: Appears calm and comfortable  Respiratory system: Clear to auscultation. Respiratory effort normal. Cardiovascular system: S1 & S2 heard, RRR.  Gastrointestinal system: Abdomen is soft Central nervous system: Alert and awake Extremities: No edema Skin: No significant lesions noted Psychiatry: Flat  affect.    Data Reviewed: I have personally reviewed following labs and imaging studies  CBC: Recent Labs  Lab 10/04/23 1127 10/05/23 0459 10/06/23 0457  WBC 18.4* 25.3* 12.5*  NEUTROABS  --   --  10.7*  HGB 17.3* 14.0 12.9*  HCT 53.7* 42.6 38.1*  MCV 95.0 93.6 91.6  PLT 158 131* 100*   Basic Metabolic Panel: Recent Labs  Lab 10/04/23 1127 10/05/23 0459 10/06/23 0457  NA 137 131* 129*  K 4.5 4.8 3.6  CL 106 105 100  CO2 18* 16* 18*  GLUCOSE 95 69* 73  BUN 9 15 19   CREATININE 1.49* 1.33* 1.19  CALCIUM 9.5 8.2* 8.1*   GFR: Estimated Creatinine Clearance: 65.5 mL/min (by C-G formula based on SCr of 1.19 mg/dL). Liver Function Tests: Recent Labs  Lab 10/04/23 1127 10/05/23 0459 10/05/23 0921 10/06/23 0457  AST 194* 98*  --  61*  ALT 120* 88*  --  66*  ALKPHOS 94 72  --  78  BILITOT 4.6* 6.9* 3.0* 6.2*  PROT 6.1* 5.3*  --  5.2*  ALBUMIN 2.8* 2.4*  --  2.2*   Recent Labs  Lab 10/04/23 1127 10/06/23 0457  LIPASE 37 55*  No results for input(s): "AMMONIA" in the last 168 hours. Coagulation Profile: Recent Labs  Lab 10/04/23 1851 10/05/23 0921 10/06/23 0457  INR 1.6* 2.9* 1.9*   Cardiac Enzymes: No results for input(s): "CKTOTAL", "CKMB", "CKMBINDEX", "TROPONINI" in the last 168 hours. BNP (last 3 results) No results for input(s): "PROBNP" in the last 8760 hours. HbA1C: No results for input(s): "HGBA1C" in the last 72 hours. CBG: Recent Labs  Lab 10/05/23 1156 10/05/23 1634 10/05/23 1953 10/06/23 0744 10/06/23 1144  GLUCAP 108* 94 80 88 110*   Lipid Profile: No results for input(s): "CHOL", "HDL", "LDLCALC", "TRIG", "CHOLHDL", "LDLDIRECT" in the last 72 hours. Thyroid Function Tests: No results for input(s): "TSH", "T4TOTAL", "FREET4", "T3FREE", "THYROIDAB" in the last 72 hours. Anemia Panel: Recent Labs    10/04/23 1851  FERRITIN 127  TIBC 250  IRON 32*   Sepsis Labs: Recent Labs  Lab 10/04/23 1525 10/04/23 1851 10/04/23 2058   PROCALCITON  --  16.57  --   LATICACIDVEN 3.7*  --  3.1*    Recent Results (from the past 240 hour(s))  Blood culture (routine x 2)     Status: None (Preliminary result)   Collection Time: 10/04/23  3:25 PM   Specimen: BLOOD  Result Value Ref Range Status   Specimen Description BLOOD RIGHT ANTECUBITAL  Final   Special Requests   Final    BOTTLES DRAWN AEROBIC ONLY Blood Culture adequate volume   Culture  Setup Time   Final    GRAM NEGATIVE RODS AEROBIC BOTTLE ONLY CRITICAL VALUE NOTED.  VALUE IS CONSISTENT WITH PREVIOUSLY REPORTED AND CALLED VALUE. Performed at [facility], [address] [place], [place] [address]    Culture GRAM NEGATIVE RODS  Final   Report Status PENDING  Incomplete  Blood culture (routine x 2)     Status: Abnormal (Preliminary result)   Collection Time: 10/04/23  3:25 PM   Specimen: BLOOD  Result Value Ref Range Status   Specimen Description   Final    BLOOD BLOOD LEFT HAND Performed at [facility], [address] [place], [place] [address]    Special Requests   Final    BOTTLES DRAWN AEROBIC ONLY Blood Culture results may not be optimal due to an inadequate volume of blood received in culture bottles Performed at [facility], [address] [place], [place] [address]    Culture  Setup Time   Final    GRAM NEGATIVE RODS AEROBIC BOTTLE ONLY CRITICAL RESULT CALLED TO, READ BACK BY AND VERIFIED WITH: MICHELLE CATES LAND AT 1006 10/05/2023 BY T KENNEDY CRITICAL RESULT CALLED TO, READ BACK BY AND VERIFIED WITH: PHARMD F.WILSON AT 1447 AT 10/05/2023 BY T.SAAD.    Culture (A)  Final    SERRATIA LIQUEFACIENS SUSCEPTIBILITIES TO FOLLOW Performed at [facility], [address] [place], [place] [address]    Report Status PENDING  Incomplete  Blood Culture ID Panel (Reflexed)     Status: Abnormal   Collection Time: 10/04/23  3:25 PM  Result Value Ref Range Status   Enterococcus faecalis NOT DETECTED NOT DETECTED Final   Enterococcus Faecium NOT  DETECTED NOT DETECTED Final   Listeria monocytogenes NOT DETECTED NOT DETECTED Final   Staphylococcus species NOT DETECTED NOT DETECTED Final   Staphylococcus aureus (BCID) NOT DETECTED NOT DETECTED Final   Staphylococcus epidermidis NOT DETECTED NOT DETECTED Final   Staphylococcus lugdunensis NOT DETECTED NOT DETECTED Final   Streptococcus species NOT DETECTED NOT DETECTED Final   Streptococcus agalactiae  NOT DETECTED NOT DETECTED Final   Streptococcus pneumoniae NOT DETECTED NOT DETECTED Final   Streptococcus pyogenes NOT DETECTED NOT DETECTED Final   A.calcoaceticus-baumannii NOT DETECTED NOT DETECTED Final   Bacteroides fragilis NOT DETECTED NOT DETECTED Final   Enterobacterales DETECTED (A) NOT DETECTED Final    Comment: Enterobacterales represent a large order of gram negative bacteria, not a single organism. Refer to culture for further identification. CRITICAL RESULT CALLED TO, READ BACK BY AND VERIFIED WITH: PHARMD F.WILSON AT 1447 AT 10/05/2023 BY T.SAAD.    Enterobacter cloacae complex NOT DETECTED NOT DETECTED Final   Escherichia coli NOT DETECTED NOT DETECTED Final   Klebsiella aerogenes NOT DETECTED NOT DETECTED Final   Klebsiella oxytoca NOT DETECTED NOT DETECTED Final   Klebsiella pneumoniae NOT DETECTED NOT DETECTED Final   Proteus species NOT DETECTED NOT DETECTED Final   Salmonella species NOT DETECTED NOT DETECTED Final   Serratia marcescens NOT DETECTED NOT DETECTED Final   Haemophilus influenzae NOT DETECTED NOT DETECTED Final   Neisseria meningitidis NOT DETECTED NOT DETECTED Final   Pseudomonas aeruginosa NOT DETECTED NOT DETECTED Final   Stenotrophomonas maltophilia NOT DETECTED NOT DETECTED Final   Candida albicans NOT DETECTED NOT DETECTED Final   Candida auris NOT DETECTED NOT DETECTED Final   Candida glabrata NOT DETECTED NOT DETECTED Final   Candida krusei NOT DETECTED NOT DETECTED Final   Candida parapsilosis NOT DETECTED NOT DETECTED Final   Candida  tropicalis NOT DETECTED NOT DETECTED Final   Cryptococcus neoformans/gattii NOT DETECTED NOT DETECTED Final   CTX-M ESBL NOT DETECTED NOT DETECTED Final   Carbapenem resistance IMP NOT DETECTED NOT DETECTED Final   Carbapenem resistance KPC DETECTED (A) NOT DETECTED Final    Comment: CRITICAL RESULT CALLED TO, READ BACK BY AND VERIFIED WITH: PHARMD F.WILSON AT 1447 AT 10/05/2023 BY T.SAAD. (NOTE) Carbapenemase resistant enterobacterales detected. Recommend ceftazidime/avibactam as intitial therapy.     Carbapenem resistance NDM NOT DETECTED NOT DETECTED Final   Carbapenem resist OXA 48 LIKE NOT DETECTED NOT DETECTED Final   Carbapenem resistance VIM NOT DETECTED NOT DETECTED Final    Comment: Performed at [facility], [address] [place], [place] [address]  SARS Coronavirus 2 by RT PCR (hospital order, performed in [facility] hospital lab) *cepheid single result test* Anterior Nasal Swab     Status: None   Collection Time: 10/04/23  8:18 PM   Specimen: Anterior Nasal Swab  Result Value Ref Range Status   SARS Coronavirus 2 by RT PCR NEGATIVE NEGATIVE Final    Comment: (NOTE) SARS-CoV-2 target nucleic acids are NOT DETECTED.  The SARS-CoV-2 RNA is generally detectable in upper and lower respiratory specimens during the acute phase of infection. The lowest concentration of SARS-CoV-2 viral copies this assay can detect is 250 copies / mL. A negative result does not preclude SARS-CoV-2 infection and should not be used as the sole basis for treatment or other patient management decisions.  A negative result may occur with improper specimen collection / handling, submission of specimen other than nasopharyngeal swab, presence of viral mutation(s) within the areas targeted by this assay, and inadequate number of viral copies (<250 copies / mL). A negative result must be combined with clinical observations, patient history, and epidemiological information.  Fact Sheet for  Patients:   RoadLapTop.co.za  Fact Sheet for Healthcare Providers: http://kim-miller.com/  This test is not yet approved or  cleared by the [place] FDA and has been authorized for detection and/or diagnosis of SARS-CoV-2  by FDA under an Emergency Use Authorization (EUA).  This EUA will remain in effect (meaning this test can be used) for the duration of the COVID-19 declaration under Section 564(b)(1) of the Act, 21 U.S.C. section 360bbb-3(b)(1), unless the authorization is terminated or revoked sooner.  Performed at [facility], [address], [place], [place] [address]   Culture, blood (Routine X 2) w Reflex to ID Panel     Status: None (Preliminary result)   Collection Time: 10/05/23  3:58 PM   Specimen: BLOOD  Result Value Ref Range Status   Specimen Description BLOOD BLOOD RIGHT HAND  Final   Special Requests   Final    BOTTLES DRAWN AEROBIC AND ANAEROBIC Blood Culture results may not be optimal due to an inadequate volume of blood received in culture bottles   Culture   Final    NO GROWTH < 24 HOURS Performed at [facility], [address] [place], [place] [address]    Report Status PENDING  Incomplete  Culture, blood (Routine X 2) w Reflex to ID Panel     Status: None (Preliminary result)   Collection Time: 10/05/23  4:17 PM   Specimen: BLOOD  Result Value Ref Range Status   Specimen Description BLOOD BLOOD RIGHT HAND  Final   Special Requests   Final    BOTTLES DRAWN AEROBIC ONLY Blood Culture results may not be optimal due to an inadequate volume of blood received in culture bottles   Culture   Final    NO GROWTH < 24 HOURS Performed at [facility], [address] [place], [place] [address]    Report Status PENDING  Incomplete         Radiology Studies: DG CHEST PORT 1 VIEW  Result Date: 10/05/2023 CLINICAL DATA:  Leukocytosis.  Abnormal chest x-ray. EXAM: PORTABLE CHEST 1 VIEW COMPARISON:  October 04, 2023. FINDINGS: Stable cardiomediastinal silhouette. Minimally increased reticular densities are noted throughout both lungs suggesting worsening edema or possibly atypical inflammation. Bony thorax is unremarkable. IMPRESSION: Minimally increased reticular densities are noted bilaterally as described above. Electronically Signed   By: Lupita Raider M.D.   On: 10/05/2023 13:40   MR ABDOMEN W WO CONTRAST  Result Date: 10/04/2023 CLINICAL DATA:  Right upper quadrant abdominal pain, IPMN suspected EXAM: MRI ABDOMEN WITHOUT AND WITH CONTRAST TECHNIQUE: Multiplanar multisequence MR imaging of the abdomen was performed both before and after the administration of intravenous contrast. CONTRAST:  10mL GADAVIST GADOBUTROL 1 MMOL/ML IV SOLN COMPARISON:  CT abdomen pelvis, 10/04/2023 FINDINGS: Lower chest: No acute abnormality.  Trace pleural effusions. Hepatobiliary: Coarse contour of the liver. No focal liver lesion. Small gallstones. No gallbladder wall thickening, or biliary dilatation. Pancreas: Tiny fluid signal cystic lesion within the pancreatic body measuring 0.4 cm (series 4, image 16). No solid component or suspicious contrast enhancement no pancreatic ductal dilatation or surrounding inflammatory changes. Spleen: Normal in size without significant abnormality. Adrenals/Urinary Tract: Adrenal glands are unremarkable. Kidneys are normal, without renal calculi, solid lesion, or hydronephrosis. Stomach/Bowel: Stomach is within normal limits. No evidence of bowel wall thickening, distention, or inflammatory changes. Vascular/Lymphatic: Aortic atherosclerosis. Prominent portacaval lymph nodes, likely reactive in the setting of cirrhosis. Other: No abdominal wall hernia or abnormality. Small volume ascites. Musculoskeletal: No acute or significant osseous findings. IMPRESSION: 1. Coarse contour of the liver, suggestive of cirrhosis. No focal liver lesion. 2. Small volume ascites. 3. Cholelithiasis without evidence  of acute cholecystitis. 4. Tiny fluid signal cystic lesion within the pancreatic  body measuring 0.4 cm. No solid component or suspicious contrast enhancement. No pancreatic ductal dilatation or surrounding inflammatory changes. 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 Given tiny size and patient age, no specific further follow-up or characterization is required. 5. Trace pleural effusions. Aortic Atherosclerosis (ICD10-I70.0). Electronically Signed   By: Jearld Lesch M.D.   On: 10/04/2023 21:35   MR 3D Recon At Scanner  Result Date: 10/04/2023 CLINICAL DATA:  Right upper quadrant abdominal pain, IPMN suspected EXAM: MRI ABDOMEN WITHOUT AND WITH CONTRAST TECHNIQUE: Multiplanar multisequence MR imaging of the abdomen was performed both before and after the administration of intravenous contrast. CONTRAST:  10mL GADAVIST GADOBUTROL 1 MMOL/ML IV SOLN COMPARISON:  CT abdomen pelvis, 10/04/2023 FINDINGS: Lower chest: No acute abnormality.  Trace pleural effusions. Hepatobiliary: Coarse contour of the liver. No focal liver lesion. Small gallstones. No gallbladder wall thickening, or biliary dilatation. Pancreas: Tiny fluid signal cystic lesion within the pancreatic body measuring 0.4 cm (series 4, image 16). No solid component or suspicious contrast enhancement no pancreatic ductal dilatation or surrounding inflammatory changes. Spleen: Normal in size without significant abnormality. Adrenals/Urinary Tract: Adrenal glands are unremarkable. Kidneys are normal, without renal calculi, solid lesion, or hydronephrosis. Stomach/Bowel: Stomach is within normal limits. No evidence of bowel wall thickening, distention, or inflammatory changes. Vascular/Lymphatic: Aortic atherosclerosis. Prominent portacaval lymph nodes, likely reactive in the setting of cirrhosis. Other: No abdominal wall hernia or abnormality. Small volume ascites. Musculoskeletal: No acute or significant osseous findings. IMPRESSION: 1.  Coarse contour of the liver, suggestive of cirrhosis. No focal liver lesion. 2. Small volume ascites. 3. Cholelithiasis without evidence of acute cholecystitis. 4. Tiny fluid signal cystic lesion within the pancreatic body measuring 0.4 cm. No solid component or suspicious contrast enhancement. No pancreatic ductal dilatation or surrounding inflammatory changes. 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 Given tiny size and patient age, no specific further follow-up or characterization is required. 5. Trace pleural effusions. Aortic Atherosclerosis (ICD10-I70.0). Electronically Signed   By: Jearld Lesch M.D.   On: 10/04/2023 21:35        Scheduled Meds:  dorzolamide-timolol  1 drop Both Eyes BID   latanoprost  1 drop Both Eyes QHS   metoprolol succinate  25 mg Oral Daily   pantoprazole  40 mg Oral Daily   zinc sulfate (50mg  elemental zinc)  220 mg Oral QPM   Continuous Infusions:  ceftazidime-avibactam (AVYCAZ) 2.5 g in dextrose 5 % 50 mL IVPB 2.5 g (10/06/23 0536)     LOS: 2 days    Time spent: 55 minutes    Alvia Tory D Sherryll Burger, DO [facility]  If 7PM-7AM, please contact night-coverage [email or web address] 10/06/2023, 1:00 PM

## 2023-10-06 NOTE — Consult Note (Signed)
Reason for Consult: Cholelithiasis Referring Physician: Dr. Marletta Lor, gastroenterology  Mitchell Oliver is an 77 y.o. male.  HPI: Patient is a 77 year old white male with multiple medical problems including a prior CVA, NSTEMI, COPD, and hypertension on Plavix who presented to [facility] with chronic abdominal pain and nausea.  He states he has had some intermittent upper abdominal pain and watery diarrhea for the past few months.  He has been seen by GI in the past.  In the emergency room, he was noted to have transaminitis with a total bilirubin of 4.6.  His white blood cell count was also noted to be elevated 18.4.  Troponins were negative.  He was noted to have some possible inflammatory changes in the ascending colon.  He also was noted to have the new findings of cirrhosis.  An MRCP was performed which revealed cholelithiasis but a normal gallbladder.  Normal common bile duct was noted and no evidence of choledocholithiasis was seen.  He did have 1 out of 2 blood cultures at the time of admission that were positive for Enterobacter.  Follow-up blood cultures have been negative.  His bilirubin climbed to 6.9 yesterday with a repeat value of 3, it is now 6.2.  His LFTs are decreasing.  Of note was the fact that his BNP was 1300 at the time of admission.  He states he has no abdominal pain at the present time and feels much better.  He denies any right upper quadrant abdominal pain.  Past Medical History:  Diagnosis Date   Allergy    COPD (chronic obstructive pulmonary disease) (HCC)    CVA (cerebral vascular accident) (HCC) 2012   denies residual on 07/22/2015   ED (erectile dysfunction)    Family history of adverse reaction to anesthesia    "think my mother had real bad headaches after anesthesia"   GERD (gastroesophageal reflux disease)    Hypercholesterolemia    Hypertension    IFG (impaired fasting glucose)    NSTEMI (non-ST elevated myocardial infarction) (HCC)    Pneumonia ~ 2005     Past Surgical History:  Procedure Laterality Date   APPENDECTOMY  2009 [facility]   BACK SURGERY     BIOPSY  05/04/2014   Procedure: BIOPSY;  Surgeon: West Bali, MD;  [facility];  Service: Endoscopy;;   CARDIAC CATHETERIZATION N/A 07/18/2015   Procedure: Left Heart Cath and Coronary Angiography;  Surgeon: Lyn Records, MD;  Location: [facility] INVASIVE CV LAB;  Service: Cardiovascular;  Laterality: N/A;   CARDIAC CATHETERIZATION N/A 07/18/2015   Procedure: Coronary Stent Intervention;  Surgeon: Lyn Records, MD;  Location: [facility] INVASIVE CV LAB;  Service: Cardiovascular;  Laterality: N/A;   CARDIAC CATHETERIZATION N/A 07/22/2015   Procedure: Coronary Stent Intervention;  Surgeon: Lyn Records, MD;  Location: [facility] INVASIVE CV LAB;  Service: Cardiovascular;  Laterality: N/A;   COLONOSCOPY  2009   COLONOSCOPY N/A 08/04/2019   Procedure: COLONOSCOPY;  Surgeon: West Bali, MD;  [facility];  Service: Endoscopy;  Laterality: N/A;  12:30   ESOPHAGOGASTRODUODENOSCOPY N/A 05/04/2014   Procedure: ESOPHAGOGASTRODUODENOSCOPY (EGD);  Surgeon: West Bali, MD;  [facility];  Service: Endoscopy;  Laterality: N/A;  9:15   POLYPECTOMY  08/04/2019   Procedure: POLYPECTOMY;  Surgeon: West Bali, MD;  [facility];  Service: Endoscopy;;  hepatic flexure, ascending colon, transverse colon, splenic flexure, descending colon    POSTERIOR LAMINECTOMY / DECOMPRESSION LUMBAR SPINE  06/03/2015  SAVORY DILATION N/A 05/04/2014   Procedure: SAVORY DILATION;  Surgeon: West Bali, MD;  [facility];  Service: Endoscopy;  Laterality: N/A;    Family History  Problem Relation Age of Onset   Hypertension Mother    Hypertension Father    Heart attack Father    Stroke Father    Colon cancer Neg Hx    Colon polyps Neg Hx     Social History:  reports that he quit smoking about 37 years ago. His smoking use included cigarettes. He started smoking about 61  years ago. He has a 48.3 pack-year smoking history. He has never used smokeless tobacco. He reports current alcohol use of about 6.0 standard drinks of alcohol per week. He reports that he does not use drugs.  Allergies:  Allergies  Allergen Reactions   Lidocaine Hives    Rash    Medications: I have reviewed the patient's current medications. Prior to Admission:  Medications Prior to Admission  Medication Sig Dispense Refill Last Dose   albuterol (VENTOLIN HFA) 108 (90 Base) MCG/ACT inhaler INHALE 2 PUFFS INTO THE LUNGS EVERY 6 HOURS AS NEEDED FOR WHEEZING OR SHORTNESS OF BREATH (Patient taking differently: Inhale 2 puffs into the lungs daily.) 8.5 g 1 10/04/2023   amLODipine (NORVASC) 2.5 MG tablet TAKE 1 TABLET(2.5 MG) BY MOUTH DAILY 90 tablet 3 10/03/2023   Ascorbic Acid (VITAMIN C PO) Take 4,000 mg by mouth daily.   10/03/2023   atorvastatin (LIPITOR) 80 MG tablet TAKE 1 TABLET BY MOUTH DAILY 90 tablet 1 10/03/2023   benazepril (LOTENSIN) 20 MG tablet TAKE 1 TABLET(20 MG) BY MOUTH DAILY (Patient taking differently: Take 20 mg by mouth every evening.) 30 tablet 2 10/03/2023   Cholecalciferol 25 MCG (1000 UT) tablet Take 1,000 Units by mouth daily.   10/03/2023   clopidogrel (PLAVIX) 75 MG tablet TAKE 1 TABLET(75 MG) BY MOUTH DAILY 90 tablet 0 10/03/2023 at 0800   Cyanocobalamin (VITAMIN B-12 CR) 1000 MCG TBCR Take 1 tablet by mouth every evening.   10/03/2023   dorzolamide-timolol (COSOPT) 22.3-6.8 MG/ML ophthalmic solution Place 1 drop into both eyes 2 (two) times daily.   10/04/2023   fluticasone (FLONASE) 50 MCG/ACT nasal spray SHAKE LIQUID AND USE 1 SPRAY IN EACH NOSTRIL DAILY AS NEEDED FOR ALLERGIES OR RHINITIS 16 g 1 10/04/2023   Iron, Ferrous Sulfate, 325 (65 Fe) MG TABS Take 325 mg by mouth every other day. 45 tablet 1 10/02/2023   JARDIANCE 10 MG TABS tablet TAKE 1 TABLET(10 MG) BY MOUTH DAILY 90 tablet 1 10/03/2023   latanoprost (XALATAN) 0.005 % ophthalmic solution Place 1 drop into both  eyes at bedtime.   10/03/2023   metoprolol succinate (TOPROL XL) 25 MG 24 hr tablet Take 1 tablet (25 mg total) by mouth daily. 90 tablet 3 10/03/2023   ondansetron (ZOFRAN-ODT) 4 MG disintegrating tablet Take 1 tablet (4 mg total) by mouth every 8 (eight) hours as needed for nausea or vomiting. 30 tablet 0 10/03/2023   pantoprazole (PROTONIX) 40 MG tablet TAKE 1 TABLET(40 MG) BY MOUTH EVERY MORNING 90 tablet 3 10/03/2023   potassium chloride SA (KLOR-CON M) 20 MEQ tablet TAKE 1 TABLET(20 MEQ) BY MOUTH DAILY 90 tablet 0 10/03/2023   Zinc Acetate 50 MG CAPS Take 50 mg by mouth every evening.   10/03/2023   tiZANidine (ZANAFLEX) 2 MG tablet Take 2 mg by mouth 2 (two) times daily as needed. (Patient not taking: Reported on 10/04/2023)   Not Taking  Results for orders placed or performed during the hospital encounter of 10/04/23 (from the past 48 hour(s))  Comprehensive metabolic panel     Status: Abnormal   Collection Time: 10/04/23 11:27 AM  Result Value Ref Range   Sodium 137 135 - 145 mmol/L   Potassium 4.5 3.5 - 5.1 mmol/L   Chloride 106 98 - 111 mmol/L   CO2 18 (L) 22 - 32 mmol/L   Glucose, Bld 95 70 - 99 mg/dL    Comment: Glucose reference range applies only to samples taken after fasting for at least 8 hours.   BUN 9 8 - 23 mg/dL   Creatinine, Ser 2.95 (H) 0.61 - 1.24 mg/dL   Calcium 9.5 8.9 - 62.1 mg/dL   Total Protein 6.1 (L) 6.5 - 8.1 g/dL   Albumin 2.8 (L) 3.5 - 5.0 g/dL   AST 308 (H) 15 - 41 U/L   ALT 120 (H) 0 - 44 U/L   Alkaline Phosphatase 94 38 - 126 U/L   Total Bilirubin 4.6 (H) <1.2 mg/dL   GFR, Estimated 47 (L) >60 mL/min    Comment: (NOTE) Calculated using the CKD-EPI Creatinine Equation (2021)    Anion gap 13 5 - 15    Comment: Performed at [facility], [address] [place], [place] [address]  CBC     Status: Abnormal   Collection Time: 10/04/23 11:27 AM  Result Value Ref Range   WBC 18.4 (H) 4.0 - 10.5 K/uL   RBC 5.65 4.22 - 5.81 MIL/uL   Hemoglobin 17.3 (H)  13.0 - 17.0 g/dL   HCT 69.6 (H) 29.5 - 28.4 %   MCV 95.0 80.0 - 100.0 fL   MCH 30.6 26.0 - 34.0 pg   MCHC 32.2 30.0 - 36.0 g/dL   RDW 13.2 44.0 - 10.2 %   Platelets 158 150 - 400 K/uL   nRBC 0.0 0.0 - 0.2 %    Comment: Performed at [facility], [address] [place], [place] [address]  Troponin I (High Sensitivity)     Status: None   Collection Time: 10/04/23 11:27 AM  Result Value Ref Range   Troponin I (High Sensitivity) 9 <18 ng/L    Comment: (NOTE) Elevated high sensitivity troponin I (hsTnI) values and significant  changes across serial measurements may suggest ACS but many other  chronic and acute conditions are known to elevate hsTnI results.  Refer to the "Links" section for chest pain algorithms and additional  guidance. Performed at [facility], [address] [place], [place] [address]   Lipase, blood     Status: None   Collection Time: 10/04/23 11:27 AM  Result Value Ref Range   Lipase 37 11 - 51 U/L    Comment: Performed at [facility], [address], [place], [place] [address]  CBG monitoring, ED     Status: Abnormal   Collection Time: 10/04/23 12:05 PM  Result Value Ref Range   Glucose-Capillary 124 (H) 70 - 99 mg/dL    Comment: Glucose reference range applies only to samples taken after fasting for at least 8 hours.  Troponin I (High Sensitivity)     Status: None   Collection Time: 10/04/23  1:08 PM  Result Value Ref Range   Troponin I (High Sensitivity) 8 <18 ng/L    Comment: (NOTE) Elevated high sensitivity troponin I (hsTnI) values and significant  changes across serial measurements may suggest ACS but many other  chronic and acute conditions are known to elevate hsTnI results.  Refer to the "Links" section for chest pain algorithms and additional  guidance. Performed at [facility], [address], [place], [place] [address]   Urinalysis, Routine w reflex microscopic -Urine, Clean Catch     Status: Abnormal   Collection Time: 10/04/23  2:42 PM   Result Value Ref Range   Color, Urine AMBER (A) YELLOW    Comment: BIOCHEMICALS MAY BE AFFECTED BY COLOR   APPearance CLEAR CLEAR   Specific Gravity, Urine >1.046 (H) 1.005 - 1.030   pH 6.0 5.0 - 8.0   Glucose, UA 150 (A) NEGATIVE mg/dL   Hgb urine dipstick NEGATIVE NEGATIVE   Bilirubin Urine NEGATIVE NEGATIVE   Ketones, ur NEGATIVE NEGATIVE mg/dL   Protein, ur 604 (A) NEGATIVE mg/dL   Nitrite NEGATIVE NEGATIVE   Leukocytes,Ua NEGATIVE NEGATIVE   RBC / HPF 0-5 0 - 5 RBC/hpf   WBC, UA 6-10 0 - 5 WBC/hpf   Bacteria, UA NONE SEEN NONE SEEN   Squamous Epithelial / HPF 0-5 0 - 5 /HPF   Non Squamous Epithelial 0-5 (A) NONE SEEN    Comment: Performed at [facility], [address], [place], [place] [address]  Lactic acid, plasma     Status: Abnormal   Collection Time: 10/04/23  3:25 PM  Result Value Ref Range   Lactic Acid, Venous 3.7 (HH) 0.5 - 1.9 mmol/L    Comment: CRITICAL RESULT CALLED TO, READ BACK BY AND VERIFIED WITH TILDY,V ON 10/04/23 AT 1737 BY LOY,C Performed at [facility], [address] [place], [place] [address]   Blood culture (routine x 2)     Status: None (Preliminary result)   Collection Time: 10/04/23  3:25 PM   Specimen: BLOOD  Result Value Ref Range   Specimen Description BLOOD RIGHT ANTECUBITAL    Special Requests      BOTTLES DRAWN AEROBIC ONLY Blood Culture adequate volume   Culture  Setup Time      GRAM NEGATIVE RODS AEROBIC BOTTLE ONLY CRITICAL VALUE NOTED.  VALUE IS CONSISTENT WITH PREVIOUSLY REPORTED AND CALLED VALUE. Performed at [facility], [address], [place], [place] [address]    Culture GRAM NEGATIVE RODS    Report Status PENDING   Blood culture (routine x 2)     Status: None (Preliminary result)   Collection Time: 10/04/23  3:25 PM   Specimen: BLOOD  Result Value Ref Range   Specimen Description      BLOOD BLOOD LEFT HAND Performed at [facility], [address], [place], [place] [address]    Special Requests      BOTTLES DRAWN  AEROBIC ONLY Blood Culture results may not be optimal due to an inadequate volume of blood received in culture bottles Performed at [facility], [address] [place], [place] [address]    Culture  Setup Time      GRAM NEGATIVE RODS AEROBIC BOTTLE ONLY CRITICAL RESULT CALLED TO, READ BACK BY AND VERIFIED WITH: MICHELLE CATES LAND AT 1006 10/05/2023 BY T KENNEDY CRITICAL RESULT CALLED TO, READ BACK BY AND VERIFIED WITH: PHARMD F.WILSON AT 1447 AT 10/05/2023 BY T.SAAD. Performed at [facility], [address], [place], [place] [address]    Culture GRAM NEGATIVE RODS    Report Status PENDING   Blood Culture ID Panel (Reflexed)     Status: Abnormal   Collection Time: 10/04/23  3:25 PM  Result Value Ref Range   Enterococcus faecalis NOT DETECTED NOT DETECTED   Enterococcus Faecium NOT DETECTED NOT  DETECTED   Listeria monocytogenes NOT DETECTED NOT DETECTED   Staphylococcus species NOT DETECTED NOT DETECTED   Staphylococcus aureus (BCID) NOT DETECTED NOT DETECTED   Staphylococcus epidermidis NOT DETECTED NOT DETECTED   Staphylococcus lugdunensis NOT DETECTED NOT DETECTED   Streptococcus species NOT DETECTED NOT DETECTED   Streptococcus agalactiae NOT DETECTED NOT DETECTED   Streptococcus pneumoniae NOT DETECTED NOT DETECTED   Streptococcus pyogenes NOT DETECTED NOT DETECTED   A.calcoaceticus-baumannii NOT DETECTED NOT DETECTED   Bacteroides fragilis NOT DETECTED NOT DETECTED   Enterobacterales DETECTED (A) NOT DETECTED    Comment: Enterobacterales represent a large order of gram negative bacteria, not a single organism. Refer to culture for further identification. CRITICAL RESULT CALLED TO, READ BACK BY AND VERIFIED WITH: PHARMD F.WILSON AT 1447 AT 10/05/2023 BY T.SAAD.    Enterobacter cloacae complex NOT DETECTED NOT DETECTED   Escherichia coli NOT DETECTED NOT DETECTED   Klebsiella aerogenes NOT DETECTED NOT DETECTED   Klebsiella oxytoca NOT DETECTED NOT DETECTED    Klebsiella pneumoniae NOT DETECTED NOT DETECTED   Proteus species NOT DETECTED NOT DETECTED   Salmonella species NOT DETECTED NOT DETECTED   Serratia marcescens NOT DETECTED NOT DETECTED   Haemophilus influenzae NOT DETECTED NOT DETECTED   Neisseria meningitidis NOT DETECTED NOT DETECTED   Pseudomonas aeruginosa NOT DETECTED NOT DETECTED   Stenotrophomonas maltophilia NOT DETECTED NOT DETECTED   Candida albicans NOT DETECTED NOT DETECTED   Candida auris NOT DETECTED NOT DETECTED   Candida glabrata NOT DETECTED NOT DETECTED   Candida krusei NOT DETECTED NOT DETECTED   Candida parapsilosis NOT DETECTED NOT DETECTED   Candida tropicalis NOT DETECTED NOT DETECTED   Cryptococcus neoformans/gattii NOT DETECTED NOT DETECTED   CTX-M ESBL NOT DETECTED NOT DETECTED   Carbapenem resistance IMP NOT DETECTED NOT DETECTED   Carbapenem resistance KPC DETECTED (A) NOT DETECTED    Comment: CRITICAL RESULT CALLED TO, READ BACK BY AND VERIFIED WITH: PHARMD F.WILSON AT 1447 AT 10/05/2023 BY T.SAAD. (NOTE) Carbapenemase resistant enterobacterales detected. Recommend ceftazidime/avibactam as intitial therapy.     Carbapenem resistance NDM NOT DETECTED NOT DETECTED   Carbapenem resist OXA 48 LIKE NOT DETECTED NOT DETECTED   Carbapenem resistance VIM NOT DETECTED NOT DETECTED    Comment: Performed at [facility], [address] [place], [place] [address]  Brain natriuretic peptide     Status: Abnormal   Collection Time: 10/04/23  6:51 PM  Result Value Ref Range   B Natriuretic Peptide 1,308.0 (H) 0.0 - 100.0 pg/mL    Comment: Performed at [facility], [address] [place], [place] [address]  Hepatitis panel, acute     Status: None   Collection Time: 10/04/23  6:51 PM  Result Value Ref Range   Hepatitis B Surface Ag NON REACTIVE NON REACTIVE   HCV Ab NON REACTIVE NON REACTIVE    Comment: (NOTE) Nonreactive HCV antibody screen is consistent with no HCV infections,  unless recent infection  is suspected or other evidence exists to indicate HCV infection.     Hep A IgM NON REACTIVE NON REACTIVE   Hep B C IgM NON REACTIVE NON REACTIVE    Comment: Performed at [facility], [address] [place], [place] [address]  Protime-INR     Status: Abnormal   Collection Time: 10/04/23  6:51 PM  Result Value Ref Range   Prothrombin Time 19.1 (H) 11.4 - 15.2 seconds   INR 1.6 (H) 0.8 - 1.2    Comment: (NOTE)  INR goal varies based on device and disease states. Performed at [facility], [address], [place], [place] [address]   ANA w/Reflex if Positive     Status: Abnormal   Collection Time: 10/04/23  6:51 PM  Result Value Ref Range   Anti Nuclear Antibody (ANA) Positive (A) Negative    Comment: (NOTE) Performed At: [facility] [address] [place], [place] [id number] Jolene Schimke MD YN:[id number]   IgG, IgA, IgM     Status: Abnormal   Collection Time: 10/04/23  6:51 PM  Result Value Ref Range   IgG (Immunoglobin G), Serum 1,241 603 - 1,613 mg/dL   IgA 657 (H) 61 - 846 mg/dL   IgM (Immunoglobulin M), Srm 138 15 - 143 mg/dL    Comment: (NOTE) Performed At: [facility] [address] [place], [place] [id number] Jolene Schimke MD LK:[id number]   Ceruloplasmin     Status: None   Collection Time: 10/04/23  6:51 PM  Result Value Ref Range   Ceruloplasmin 18.1 16.0 - 31.0 mg/dL    Comment: (NOTE) Performed At: [facility] [address] [place], [place] [id number] Jolene Schimke MD QV:[id number]   Iron and TIBC     Status: Abnormal   Collection Time: 10/04/23  6:51 PM  Result Value Ref Range   Iron 32 (L) 45 - 182 ug/dL   TIBC 329 518 - 841 ug/dL   Saturation Ratios 13 (L) 17.9 - 39.5 %   UIBC 218 ug/dL    Comment: Performed at [facility], [address], [place], [place] [address]  Ferritin     Status: None   Collection Time: 10/04/23  6:51 PM  Result Value Ref Range   Ferritin 127 24 - 336 ng/mL    Comment: ICTERUS AT THIS  LEVEL MAY AFFECT RESULT ICTERUS AT THIS LEVEL MAY AFFECT RESULT Performed at [facility], [address] [place], [place] [address]   Procalcitonin     Status: None   Collection Time: 10/04/23  6:51 PM  Result Value Ref Range   Procalcitonin 16.57 ng/mL    Comment:        Interpretation: PCT >= 10 ng/mL: Important systemic inflammatory response, almost exclusively due to severe bacterial sepsis or septic shock. (NOTE)       Sepsis PCT Algorithm           Lower Respiratory Tract                                      Infection PCT Algorithm    ----------------------------     ----------------------------         PCT < 0.25 ng/mL                PCT < 0.10 ng/mL          Strongly encourage             Strongly discourage   discontinuation of antibiotics    initiation of antibiotics    ----------------------------     -----------------------------       PCT 0.25 - 0.50 ng/mL            PCT 0.10 - 0.25 ng/mL               OR       >80% decrease in PCT            Discourage initiation of  antibiotics      Encourage discontinuation           of antibiotics    ----------------------------     -----------------------------         PCT >= 0.50 ng/mL              PCT 0.26 - 0.50 ng/mL                AND       <80% decrease in PCT             Encourage initiation of                                             antibiotics       Encourage continuation           of antibiotics    ----------------------------     -----------------------------        PCT >= 0.50 ng/mL                  PCT > 0.50 ng/mL               AND         increase in PCT                  Strongly encourage                                      initiation of antibiotics    Strongly encourage escalation           of antibiotics                                     -----------------------------                                           PCT <= 0.25 ng/mL                                                  OR                                        > 80% decrease in PCT                                      Discontinue / Do not initiate                                             antibiotics  Performed at [facility], [address], [place], [place] [address]   ENA+DNA/DS+ANTICH+CENTRO+JO...     Status: Abnormal   Collection Time: 10/04/23  6:51 PM  Result Value Ref Range   ds DNA Ab 2 0 - 9 IU/mL    Comment: (NOTE)                                   Negative      <5                                   Equivocal  5 - 9                                   Positive      >9    Ribonucleic Protein 1.3 (H) 0.0 - 0.9 AI   ENA SM Ab Ser-aCnc <0.2 0.0 - 0.9 AI   Scleroderma (Scl-70) (ENA) Antibody, IgG <0.2 0.0 - 0.9 AI   SSA (Ro) (ENA) Antibody, IgG <0.2 0.0 - 0.9 AI   SSB (La) (ENA) Antibody, IgG <0.2 0.0 - 0.9 AI   Chromatin Ab SerPl-aCnc <0.2 0.0 - 0.9 AI   Anti JO-1 <0.2 0.0 - 0.9 AI   Centromere Ab Screen <0.2 0.0 - 0.9 AI   See below: Comment     Comment: (NOTE) Autoantibody                       Disease Association ------------------------------------------------------------                        Condition                  Frequency ---------------------   ------------------------   --------- Antinuclear Antibody,    SLE, mixed connective Direct (ANA-D)           tissue diseases ---------------------   ------------------------   --------- dsDNA                    SLE                        40 - 60% ---------------------   ------------------------   --------- Chromatin                Drug induced SLE                90%                         SLE                        48 - 97% ---------------------   ------------------------   --------- SSA (Ro)                 SLE                        25 - 35%                         Sjogren's Syndrome         40 - 70%                         Neonatal Lupus  100% ---------------------   ------------------------    --------- SSB (La)                 SLE                              10%                         Sjogren's Syndrome              30% ---------------------   -----------------------    --------- Sm (anti-Smith)          SLE                        15 - 30% ---------------------   -----------------------    --------- RNP                      Mixed Connective Tissue                         Disease                         95% (U1 nRNP,                SLE                        30 - 50% anti-ribonucleoprotein)  Polymyositis and/or                         Dermatomyositis                 20% ---------------------   ------------------------   --------- Scl-70 (antiDNA          Scleroderma (diffuse)      20 - 35% topoisomerase)           Crest                           13% ---------------------   ------------------------   --------- Jo-1                     Polymyositis and/or                         Dermatomyositis            20 - 40% ---------------------   ------------------------   --------- Centromere B             Scleroderma -  Crest                         variant                         80% Performed At: [facility] [employer] [address] [place], [place] [id number] Jolene Schimke MD QM:[id number]   SARS Coronavirus 2 by RT PCR (hospital order, performed in [facility] hospital lab) *cepheid single result test* Anterior Nasal Swab     Status: None   Collection Time: 10/04/23  8:18 PM   Specimen: Anterior Nasal Swab  Result Value Ref Range   SARS Coronavirus 2 by RT PCR NEGATIVE  NEGATIVE    Comment: (NOTE) SARS-CoV-2 target nucleic acids are NOT DETECTED.  The SARS-CoV-2 RNA is generally detectable in upper and lower respiratory specimens during the acute phase of infection. The lowest concentration of SARS-CoV-2 viral copies this assay can detect is 250 copies / mL. A negative result does not preclude SARS-CoV-2 infection and should not be used as the sole basis for treatment or  other patient management decisions.  A negative result may occur with improper specimen collection / handling, submission of specimen other than nasopharyngeal swab, presence of viral mutation(s) within the areas targeted by this assay, and inadequate number of viral copies (<250 copies / mL). A negative result must be combined with clinical observations, patient history, and epidemiological information.  Fact Sheet for Patients:   [employer]  Fact Sheet for Healthcare Providers: http://kim-miller.com/  This test is not yet approved or  cleared by the [place] FDA and has been authorized for detection and/or diagnosis of SARS-CoV-2 by FDA under an Emergency Use Authorization (EUA).  This EUA will remain in effect (meaning this test can be used) for the duration of the COVID-19 declaration under Section 564(b)(1) of the Act, 21 U.S.C. section 360bbb-3(b)(1), unless the authorization is terminated or revoked sooner.  Performed at [facility], [address] [place], [place] [address]   Lactic acid, plasma     Status: Abnormal   Collection Time: 10/04/23  8:58 PM  Result Value Ref Range   Lactic Acid, Venous 3.1 (HH) 0.5 - 1.9 mmol/L    Comment: CRITICAL RESULT CALLED TO, READ BACK BY AND VERIFIED WITH S. VILLALOVOS AT 2243 ON 12.09.24 BY ADGER J Performed at [facility] [address] [place], [place] [address]   Comprehensive metabolic panel     Status: Abnormal   Collection Time: 10/05/23  4:59 AM  Result Value Ref Range   Sodium 131 (L) 135 - 145 mmol/L   Potassium 4.8 3.5 - 5.1 mmol/L   Chloride 105 98 - 111 mmol/L   CO2 16 (L) 22 - 32 mmol/L   Glucose, Bld 69 (L) 70 - 99 mg/dL    Comment: Glucose reference range applies only to samples taken after fasting for at least 8 hours.   BUN 15 8 - 23 mg/dL   Creatinine, Ser 0.98 (H) 0.61 - 1.24 mg/dL   Calcium 8.2 (L) 8.9 - 10.3 mg/dL   Total Protein 5.3 (L) 6.5 - 8.1  g/dL   Albumin 2.4 (L) 3.5 - 5.0 g/dL   AST 98 (H) 15 - 41 U/L   ALT 88 (H) 0 - 44 U/L   Alkaline Phosphatase 72 38 - 126 U/L   Total Bilirubin 6.9 (H) <1.2 mg/dL   GFR, Estimated 54 (L) >60 mL/min    Comment: (NOTE) Calculated using the CKD-EPI Creatinine Equation (2021)    Anion gap 10 5 - 15    Comment: Performed at [facility], [address], [place], [place] [address]  CBC     Status: Abnormal   Collection Time: 10/05/23  4:59 AM  Result Value Ref Range   WBC 25.3 (H) 4.0 - 10.5 K/uL    Comment: REPEATED TO VERIFY WHITE COUNT CONFIRMED ON SMEAR    RBC 4.55 4.22 - 5.81 MIL/uL   Hemoglobin 14.0 13.0 - 17.0 g/dL   HCT 78.2 95.6 - 21.3 %   MCV 93.6 80.0 - 100.0 fL   MCH 30.8 26.0 - 34.0 pg   MCHC 32.9 30.0 - 36.0 g/dL  RDW 14.8 11.5 - 15.5 %   Platelets 131 (L) 150 - 400 K/uL   nRBC 0.0 0.0 - 0.2 %    Comment: Performed at [facility], [address], [place], [place] [address]  Protime-INR     Status: Abnormal   Collection Time: 10/05/23  9:21 AM  Result Value Ref Range   Prothrombin Time 30.9 (H) 11.4 - 15.2 seconds   INR 2.9 (H) 0.8 - 1.2    Comment: (NOTE) INR goal varies based on device and disease states. Performed at [facility], [address], [place], [place] [address]   Bilirubin, fractionated(tot/dir/indir)     Status: Abnormal   Collection Time: 10/05/23  9:21 AM  Result Value Ref Range   Total Bilirubin 3.0 (H) <1.2 mg/dL   Bilirubin, Direct 1.9 (H) 0.0 - 0.2 mg/dL   Indirect Bilirubin 1.1 (H) 0.3 - 0.9 mg/dL    Comment: Performed at [facility], [address], [place], [place] [address]  Glucose, capillary     Status: Abnormal   Collection Time: 10/05/23 11:56 AM  Result Value Ref Range   Glucose-Capillary 108 (H) 70 - 99 mg/dL    Comment: Glucose reference range applies only to samples taken after fasting for at least 8 hours.   Comment 1 Notify RN    Comment 2 Document in Chart   Culture, blood (Routine X 2) w Reflex to ID Panel     Status:  None (Preliminary result)   Collection Time: 10/05/23  3:58 PM   Specimen: BLOOD  Result Value Ref Range   Specimen Description BLOOD BLOOD RIGHT HAND    Special Requests      BOTTLES DRAWN AEROBIC AND ANAEROBIC Blood Culture results may not be optimal due to an inadequate volume of blood received in culture bottles   Culture      NO GROWTH < 24 HOURS Performed at [facility], [address] [place], [place] [address]    Report Status PENDING   Culture, blood (Routine X 2) w Reflex to ID Panel     Status: None (Preliminary result)   Collection Time: 10/05/23  4:17 PM   Specimen: BLOOD  Result Value Ref Range   Specimen Description BLOOD BLOOD RIGHT HAND    Special Requests      BOTTLES DRAWN AEROBIC ONLY Blood Culture results may not be optimal due to an inadequate volume of blood received in culture bottles   Culture      NO GROWTH < 24 HOURS Performed at [facility], [address], [place], [place] [address]    Report Status PENDING   Glucose, capillary     Status: None   Collection Time: 10/05/23  4:34 PM  Result Value Ref Range   Glucose-Capillary 94 70 - 99 mg/dL    Comment: Glucose reference range applies only to samples taken after fasting for at least 8 hours.   Comment 1 Notify RN    Comment 2 Document in Chart   Glucose, capillary     Status: None   Collection Time: 10/05/23  7:53 PM  Result Value Ref Range   Glucose-Capillary 80 70 - 99 mg/dL    Comment: Glucose reference range applies only to samples taken after fasting for at least 8 hours.  CBC with Differential/Platelet     Status: Abnormal   Collection Time: 10/06/23  4:57 AM  Result Value Ref Range   WBC 12.5 (H) 4.0 - 10.5 K/uL   RBC 4.16 (L) 4.22 -  5.81 MIL/uL   Hemoglobin 12.9 (L) 13.0 - 17.0 g/dL   HCT 78.2 (L) 95.6 - 21.3 %   MCV 91.6 80.0 - 100.0 fL   MCH 31.0 26.0 - 34.0 pg   MCHC 33.9 30.0 - 36.0 g/dL   RDW 08.6 57.8 - 46.9 %   Platelets 100 (L) 150 - 400 K/uL    Comment: Immature Platelet  Fraction may be clinically indicated, consider ordering this additional test [id number] REPEATED TO VERIFY    nRBC 0.0 0.0 - 0.2 %   Neutrophils Relative % 85 %   Neutro Abs 10.7 (H) 1.7 - 7.7 K/uL   Lymphocytes Relative 8 %   Lymphs Abs 1.0 0.7 - 4.0 K/uL   Monocytes Relative 4 %   Monocytes Absolute 0.5 0.1 - 1.0 K/uL   Eosinophils Relative 2 %   Eosinophils Absolute 0.2 0.0 - 0.5 K/uL   Basophils Relative 0 %   Basophils Absolute 0.1 0.0 - 0.1 K/uL   Immature Granulocytes 1 %   Abs Immature Granulocytes 0.06 0.00 - 0.07 K/uL    Comment: Performed at [facility], [address], [place], [place] [address]  Comprehensive metabolic panel     Status: Abnormal   Collection Time: 10/06/23  4:57 AM  Result Value Ref Range   Sodium 129 (L) 135 - 145 mmol/L   Potassium 3.6 3.5 - 5.1 mmol/L   Chloride 100 98 - 111 mmol/L   CO2 18 (L) 22 - 32 mmol/L   Glucose, Bld 73 70 - 99 mg/dL    Comment: Glucose reference range applies only to samples taken after fasting for at least 8 hours.   BUN 19 8 - 23 mg/dL   Creatinine, Ser 1.02 0.61 - 1.24 mg/dL   Calcium 8.1 (L) 8.9 - 10.3 mg/dL   Total Protein 5.2 (L) 6.5 - 8.1 g/dL   Albumin 2.2 (L) 3.5 - 5.0 g/dL   AST 61 (H) 15 - 41 U/L   ALT 66 (H) 0 - 44 U/L   Alkaline Phosphatase 78 38 - 126 U/L   Total Bilirubin 6.2 (H) <1.2 mg/dL   GFR, Estimated >72 >53 mL/min    Comment: (NOTE) Calculated using the CKD-EPI Creatinine Equation (2021)    Anion gap 11 5 - 15    Comment: Performed at [facility], [address] [place], [place] [address]  Protime-INR     Status: Abnormal   Collection Time: 10/06/23  4:57 AM  Result Value Ref Range   Prothrombin Time 21.8 (H) 11.4 - 15.2 seconds   INR 1.9 (H) 0.8 - 1.2    Comment: (NOTE) INR goal varies based on device and disease states. Performed at [facility], [address] [place], [place] [address]   Lipase, blood     Status: Abnormal   Collection Time: 10/06/23  4:57 AM  Result Value Ref  Range   Lipase 55 (H) 11 - 51 U/L    Comment: Performed at [facility], [address], [place], [place] [address]  Glucose, capillary     Status: None   Collection Time: 10/06/23  7:44 AM  Result Value Ref Range   Glucose-Capillary 88 70 - 99 mg/dL    Comment: Glucose reference range applies only to samples taken after fasting for at least 8 hours.    DG CHEST PORT 1 VIEW  Result Date: 10/05/2023 CLINICAL DATA:  Leukocytosis.  Abnormal chest x-ray. EXAM: PORTABLE CHEST 1 VIEW COMPARISON:  October 04, 2023. FINDINGS: Stable  cardiomediastinal silhouette. Minimally increased reticular densities are noted throughout both lungs suggesting worsening edema or possibly atypical inflammation. Bony thorax is unremarkable. IMPRESSION: Minimally increased reticular densities are noted bilaterally as described above. Electronically Signed   By: Lupita Raider M.D.   On: 10/05/2023 13:40   MR ABDOMEN W WO CONTRAST  Result Date: 10/04/2023 CLINICAL DATA:  Right upper quadrant abdominal pain, IPMN suspected EXAM: MRI ABDOMEN WITHOUT AND WITH CONTRAST TECHNIQUE: Multiplanar multisequence MR imaging of the abdomen was performed both before and after the administration of intravenous contrast. CONTRAST:  10mL GADAVIST GADOBUTROL 1 MMOL/ML IV SOLN COMPARISON:  CT abdomen pelvis, 10/04/2023 FINDINGS: Lower chest: No acute abnormality.  Trace pleural effusions. Hepatobiliary: Coarse contour of the liver. No focal liver lesion. Small gallstones. No gallbladder wall thickening, or biliary dilatation. Pancreas: Tiny fluid signal cystic lesion within the pancreatic body measuring 0.4 cm (series 4, image 16). No solid component or suspicious contrast enhancement no pancreatic ductal dilatation or surrounding inflammatory changes. Spleen: Normal in size without significant abnormality. Adrenals/Urinary Tract: Adrenal glands are unremarkable. Kidneys are normal, without renal calculi, solid lesion, or hydronephrosis.  Stomach/Bowel: Stomach is within normal limits. No evidence of bowel wall thickening, distention, or inflammatory changes. Vascular/Lymphatic: Aortic atherosclerosis. Prominent portacaval lymph nodes, likely reactive in the setting of cirrhosis. Other: No abdominal wall hernia or abnormality. Small volume ascites. Musculoskeletal: No acute or significant osseous findings. IMPRESSION: 1. Coarse contour of the liver, suggestive of cirrhosis. No focal liver lesion. 2. Small volume ascites. 3. Cholelithiasis without evidence of acute cholecystitis. 4. Tiny fluid signal cystic lesion within the pancreatic body measuring 0.4 cm. No solid component or suspicious contrast enhancement. No pancreatic ductal dilatation or surrounding inflammatory changes. 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 Given tiny size and patient age, no specific further follow-up or characterization is required. 5. Trace pleural effusions. Aortic Atherosclerosis (ICD10-I70.0). Electronically Signed   By: Jearld Lesch M.D.   On: 10/04/2023 21:35   MR 3D Recon At Scanner  Result Date: 10/04/2023 CLINICAL DATA:  Right upper quadrant abdominal pain, IPMN suspected EXAM: MRI ABDOMEN WITHOUT AND WITH CONTRAST TECHNIQUE: Multiplanar multisequence MR imaging of the abdomen was performed both before and after the administration of intravenous contrast. CONTRAST:  10mL GADAVIST GADOBUTROL 1 MMOL/ML IV SOLN COMPARISON:  CT abdomen pelvis, 10/04/2023 FINDINGS: Lower chest: No acute abnormality.  Trace pleural effusions. Hepatobiliary: Coarse contour of the liver. No focal liver lesion. Small gallstones. No gallbladder wall thickening, or biliary dilatation. Pancreas: Tiny fluid signal cystic lesion within the pancreatic body measuring 0.4 cm (series 4, image 16). No solid component or suspicious contrast enhancement no pancreatic ductal dilatation or surrounding inflammatory changes. Spleen: Normal in size without significant  abnormality. Adrenals/Urinary Tract: Adrenal glands are unremarkable. Kidneys are normal, without renal calculi, solid lesion, or hydronephrosis. Stomach/Bowel: Stomach is within normal limits. No evidence of bowel wall thickening, distention, or inflammatory changes. Vascular/Lymphatic: Aortic atherosclerosis. Prominent portacaval lymph nodes, likely reactive in the setting of cirrhosis. Other: No abdominal wall hernia or abnormality. Small volume ascites. Musculoskeletal: No acute or significant osseous findings. IMPRESSION: 1. Coarse contour of the liver, suggestive of cirrhosis. No focal liver lesion. 2. Small volume ascites. 3. Cholelithiasis without evidence of acute cholecystitis. 4. Tiny fluid signal cystic lesion within the pancreatic body measuring 0.4 cm. No solid component or suspicious contrast enhancement. No pancreatic ductal dilatation or surrounding inflammatory changes. 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 Given  tiny size and patient age, no specific further follow-up or characterization is required. 5. Trace pleural effusions. Aortic Atherosclerosis (ICD10-I70.0). Electronically Signed   By: Jearld Lesch M.D.   On: 10/04/2023 21:35   CT ABDOMEN PELVIS W CONTRAST  Result Date: 10/04/2023 CLINICAL DATA:  Acute onset abdominal pain radiating to the lower back EXAM: CT ABDOMEN AND PELVIS WITH CONTRAST TECHNIQUE: Multidetector CT imaging of the abdomen and pelvis was performed using the standard protocol following bolus administration of intravenous contrast. RADIATION DOSE REDUCTION: This exam was performed according to the departmental dose-optimization program which includes automated exposure control, adjustment of the mA and/or kV according to patient size and/or use of iterative reconstruction technique. CONTRAST:  OMNIPAQUE IOHEXOL 300 MG/ML  SOLN COMPARISON:  CT abdomen and pelvis dated 11/21/2015 FINDINGS: Lower chest: No focal consolidation or pulmonary  nodule in the lung bases. No pleural effusion or pneumothorax demonstrated. Partially imaged heart size is normal. Coronary artery calcifications. Hepatobiliary: Cirrhotic morphology. Subcentimeter segment 5/6 hypodensity (2:25), too small to characterize. Mild periportal edema. No intra or extrahepatic biliary ductal dilation. Cholelithiasis. Pancreas: 4 mm hypodensity in the pancreatic body (4:51), too small to characterize. No main pancreatic ductal dilation. Spleen: Normal in size without focal abnormality. Adrenals/Urinary Tract: No adrenal nodules. No suspicious renal mass, calculi or hydronephrosis. No focal bladder wall thickening. Stomach/Bowel: Minimally dilated fluid-filled esophagus. Normal appearance of the stomach. Mild circumferential mural thickening of the ascending colon. A few mildly dilated loops of right hemi abdominal small bowel with interspersed areas of mild circumferential mural thickening. Moderate volume stool in the rectum. Colonic diverticulosis without acute diverticulitis. Appendectomy. Vascular/Lymphatic: Aortic atherosclerosis. Upper abdominal varices. 14 mm periportal lymph node (2:26). Reproductive: Prostate is unremarkable. Other: Small volume ascites. No free air or fluid collection. A few scattered punctate calcifications along the inferior right hepatic lobe (2:34), unchanged. Musculoskeletal: No acute or abnormal lytic or blastic osseous lesions. Multilevel degenerative changes of the partially imaged thoracic and lumbar spine. Small fat-containing left inguinal hernia. IMPRESSION: 1. Mild circumferential mural thickening of the ascending colon and a few mildly dilated loops of right hemi abdominal small bowel with interspersed areas of mild circumferential mural thickening may represent infectious/inflammatory enterocolitis or portal enterocolopathy. 2. 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 3.  Cholelithiasis. 4. 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 No main ductal dilation, mass lesion, or abnormal enhancement. Follow-up imaging in 2 years can be considered with contrast-enhanced MRI/MRCP if the patient is a surgical candidate. Alternatively, attention on follow-up. 5.  Aortic Atherosclerosis (ICD10-I70.0). Electronically Signed   By: Agustin Cree M.D.   On: 10/04/2023 14:56   DG Chest Port 1 View  Result Date: 10/04/2023 CLINICAL DATA:  Chest pain EXAM: PORTABLE CHEST 1 VIEW COMPARISON:  X-ray 07/16/2015 FINDINGS: No consolidation, pneumothorax or effusion. No edema. Normal cardiopericardial silhouette. Increasing interstitial changes. Overlapping cardiac leads. Degenerative changes along the spine. IMPRESSION: Increasing interstitial changes of the lungs. Acute versus chronic. Please correlate with clinical presentation and recommend short follow-up. Electronically Signed   By: Karen Kays M.D.   On: 10/04/2023 12:20    ROS:  Pertinent items are noted in HPI.  Blood pressure (!) 140/70, pulse (!) 57, temperature 98.1 F (36.7 C), temperature source Oral, resp. rate 20, height 6\' 2"  (1.88 m), weight 106.7 kg, SpO2 93%. Physical Exam: Patient is a well-developed well-nourished white male in  no acute distress Head is normocephalic, atraumatic Abdomen is soft, nontender, nondistended.  No right upper quadrant abdominal pain was elicited.  No rigidity is noted.  Assessment/Plan: Impression: Hyperbilirubinemia with transaminitis of unknown etiology.  He does have a mildly elevated lipase today and this would be suggestive of passing a stone.  As his white blood cell count has significantly improved and clinically he looks well, no need for urgent cholecystectomy at the present time.  I also do not feel a cholecystostomy tube is warranted.  His elevated total bilirubin may be secondary to the passing  of the stone as well as intrinsic hepatic disease.  Patient is responding to antibiotic therapy.  This was discussed with the patient and family.  Will follow with you.  Will advance to heart healthy diet.  Franky Macho 10/06/2023, 9:14 AM

## 2023-10-06 NOTE — Plan of Care (Signed)

## 2023-10-07 DIAGNOSIS — D72829 Elevated white blood cell count, unspecified: Secondary | ICD-10-CM

## 2023-10-07 DIAGNOSIS — R0902 Hypoxemia: Secondary | ICD-10-CM

## 2023-10-07 DIAGNOSIS — R7989 Other specified abnormal findings of blood chemistry: Secondary | ICD-10-CM | POA: Diagnosis not present

## 2023-10-07 DIAGNOSIS — A4150 Gram-negative sepsis, unspecified: Secondary | ICD-10-CM

## 2023-10-07 DIAGNOSIS — K8689 Other specified diseases of pancreas: Secondary | ICD-10-CM | POA: Diagnosis not present

## 2023-10-07 DIAGNOSIS — K7460 Unspecified cirrhosis of liver: Secondary | ICD-10-CM

## 2023-10-07 DIAGNOSIS — E872 Acidosis, unspecified: Secondary | ICD-10-CM

## 2023-10-07 DIAGNOSIS — R748 Abnormal levels of other serum enzymes: Secondary | ICD-10-CM | POA: Diagnosis not present

## 2023-10-07 LAB — CBC WITH DIFFERENTIAL/PLATELET
Abs Immature Granulocytes: 0.04 10*3/uL (ref 0.00–0.07)
Basophils Absolute: 0 10*3/uL (ref 0.0–0.1)
Basophils Relative: 0 %
Eosinophils Absolute: 0.2 10*3/uL (ref 0.0–0.5)
Eosinophils Relative: 2 %
HCT: 39.4 % (ref 39.0–52.0)
Hemoglobin: 13.8 g/dL (ref 13.0–17.0)
Immature Granulocytes: 1 %
Lymphocytes Relative: 13 %
Lymphs Abs: 1.1 10*3/uL (ref 0.7–4.0)
MCH: 31.2 pg (ref 26.0–34.0)
MCHC: 35 g/dL (ref 30.0–36.0)
MCV: 88.9 fL (ref 80.0–100.0)
Monocytes Absolute: 0.6 10*3/uL (ref 0.1–1.0)
Monocytes Relative: 7 %
Neutro Abs: 6.8 10*3/uL (ref 1.7–7.7)
Neutrophils Relative %: 77 %
Platelets: 96 10*3/uL — ABNORMAL LOW (ref 150–400)
RBC: 4.43 MIL/uL (ref 4.22–5.81)
RDW: 14.4 % (ref 11.5–15.5)
WBC: 8.8 10*3/uL (ref 4.0–10.5)
nRBC: 0 % (ref 0.0–0.2)

## 2023-10-07 LAB — COMPREHENSIVE METABOLIC PANEL
ALT: 66 U/L — ABNORMAL HIGH (ref 0–44)
AST: 70 U/L — ABNORMAL HIGH (ref 15–41)
Albumin: 2.3 g/dL — ABNORMAL LOW (ref 3.5–5.0)
Alkaline Phosphatase: 110 U/L (ref 38–126)
Anion gap: 9 (ref 5–15)
BUN: 12 mg/dL (ref 8–23)
CO2: 19 mmol/L — ABNORMAL LOW (ref 22–32)
Calcium: 8.2 mg/dL — ABNORMAL LOW (ref 8.9–10.3)
Chloride: 101 mmol/L (ref 98–111)
Creatinine, Ser: 0.86 mg/dL (ref 0.61–1.24)
GFR, Estimated: >60 mL/min (ref >60)
Glucose, Bld: 77 mg/dL (ref 70–99)
Potassium: 3.5 mmol/L (ref 3.5–5.1)
Sodium: 129 mmol/L — ABNORMAL LOW (ref 135–145)
Total Bilirubin: 6.1 mg/dL — ABNORMAL HIGH (ref <1.2)
Total Protein: 5.4 g/dL — ABNORMAL LOW (ref 6.5–8.1)

## 2023-10-07 LAB — BILIRUBIN, DIRECT: Bilirubin, Direct: 3.7 mg/dL — ABNORMAL HIGH (ref 0.0–0.2)

## 2023-10-07 LAB — GLUCOSE, CAPILLARY
Glucose-Capillary: 79 mg/dL (ref 70–99)
Glucose-Capillary: 174 mg/dL — ABNORMAL HIGH (ref 70–99)
Glucose-Capillary: 102 mg/dL — ABNORMAL HIGH (ref 70–99)

## 2023-10-07 LAB — MAGNESIUM: Magnesium: 1.7 mg/dL (ref 1.7–2.4)

## 2023-10-07 LAB — CULTURE, BLOOD (ROUTINE X 2): Special Requests: ADEQUATE

## 2023-10-07 LAB — MITOCHONDRIAL ANTIBODIES: Mitochondrial M2 Ab, IgG: <20 U (ref 0.0–20.0)

## 2023-10-07 LAB — ANTI-SMOOTH MUSCLE ANTIBODY, IGG: F-Actin IgG: 17 U (ref 0–19)

## 2023-10-07 MED ORDER — METRONIDAZOLE 500 MG PO TABS
500.0000 mg | ORAL_TABLET | Freq: Two times a day (BID) | ORAL | Status: DC
  Administered 2023-10-07: 500 mg via ORAL
  Filled 2023-10-07: qty 1

## 2023-10-07 MED ORDER — POLYETHYLENE GLYCOL 3350 17 G PO PACK: 17.0000 g | PACK | Freq: Every day | ORAL | 0 refills | Status: DC | PRN

## 2023-10-07 MED ORDER — METRONIDAZOLE 500 MG PO TABS: 500.0000 mg | ORAL_TABLET | Freq: Two times a day (BID) | ORAL | 0 refills | Status: AC

## 2023-10-07 MED ORDER — LEVOFLOXACIN 750 MG PO TABS
750.0000 mg | ORAL_TABLET | Freq: Every day | ORAL | Status: DC
  Administered 2023-10-07: 750 mg via ORAL
  Filled 2023-10-07: qty 1

## 2023-10-07 MED ORDER — LEVOFLOXACIN 750 MG PO TABS: 750.0000 mg | ORAL_TABLET | Freq: Every day | ORAL | 0 refills | Status: AC

## 2023-10-07 MED ORDER — CEFTAZIDIME-AVIBACTAM 2.5 (2-0.5) G IV SOLR
2.5000 g | Freq: Once | INTRAVENOUS | Status: AC
  Administered 2023-10-07: 2.5 g via INTRAVENOUS
  Filled 2023-10-07: qty 12

## 2023-10-07 MED ORDER — ONDANSETRON HCL 4 MG PO TABS: 4.0000 mg | ORAL_TABLET | Freq: Four times a day (QID) | ORAL | 0 refills | Status: AC | PRN

## 2023-10-07 NOTE — Care Management Important Message (Signed)
Important Message  Patient Details  Name: Mitchell Oliver MRN: [id number] Date of Birth: 02-13-1946   Important Message Given:  Yes - Medicare IM (patient on isolation, unable to reach by phone, copy mailed to address on file)     Corey Harold 10/07/2023, 4:22 PM

## 2023-10-07 NOTE — Discharge Summary (Signed)
Physician Discharge Summary  Mitchell Oliver ZOX:[id number] DOB: [birth year] DOA: 10/04/2023  PCP: [name], DO  Admit date: 10/04/2023  Discharge date: 10/07/2023  Admitted From:Home  Disposition:  Home  Recommendations for Outpatient Follow-up:  Follow up with PCP in 1-2 weeks Follow-up with GI with referral sent to reevaluate hyperbilirubinemia along with transaminitis Continue on Levaquin and Flagyl for total 2-week course as recommended per ID given Enterobacterales bacteremia Continue other home medications as noted below Avoid alcohol use  Home Health: None  Equipment/Devices: None  Discharge Condition:Stable  CODE STATUS: Full  Diet recommendation: Heart Healthy/carb modified  Brief/Interim Summary:  77 y.o. male with medical history significant for COPD, coronary artery disease, hypertension, CVA, diabetes mellitus, NSTEMI. Patient presented to the ED with complaints of chest pain abdominal pain and back pain.  Patient was admitted for severe sepsis from Enterobacter bacteremia along with elevated liver enzymes of unknown etiology, but appears to be related to some microlithiasis and possibly some passed gallstones.  MRI reviewed with radiology with no concerning findings noted.  He has been transitioned to oral antibiotics with Levaquin and Flagyl per ID and will be scheduled to see GI in the next few days outpatient to recheck his lab tests.  He is currently tolerating diet and has no further symptomatology of nausea or vomiting or abdominal pain, fevers or chills.  Discussion had with family members regarding the need to present back to the ED should he have any of the symptoms.  He will otherwise work on finishing the course of antibiotics and follow-up outpatient.  Discharge Diagnoses:  Principal Problem:   Elevated liver enzymes Active Problems:   Enterocolitis   Acute hypoxic respiratory failure (HCC)   Severe sepsis (HCC)   AKI (acute kidney injury) (HCC)    CAD S/P percutaneous coronary angioplasty   Essential hypertension, benign   Type 2 diabetes mellitus with other circulatory complications (HCC)   COPD with emphysema (HCC)  Principal discharge diagnosis: Transaminitis/hyperbilirubinemia as well as severe sepsis with Enterobacterales bacteremia.  Discharge Instructions  Discharge Instructions     Ambulatory referral to Gastroenterology   Complete by: As directed    What is the reason for referral?: Other   Diet - low sodium heart healthy   Complete by: As directed    Increase activity slowly   Complete by: As directed       Allergies as of 10/07/2023       Reactions   Lidocaine Hives   Rash        Medication List     STOP taking these medications    atorvastatin 80 MG tablet Commonly known as: LIPITOR       TAKE these medications    albuterol 108 (90 Base) MCG/ACT inhaler Commonly known as: VENTOLIN HFA INHALE 2 PUFFS INTO THE LUNGS EVERY 6 HOURS AS NEEDED FOR WHEEZING OR SHORTNESS OF BREATH What changed: when to take this   amLODipine 2.5 MG tablet Commonly known as: NORVASC TAKE 1 TABLET(2.5 MG) BY MOUTH DAILY   benazepril 20 MG tablet Commonly known as: LOTENSIN TAKE 1 TABLET(20 MG) BY MOUTH DAILY What changed: See the new instructions.   Cholecalciferol 25 MCG (1000 UT) tablet Take 1,000 Units by mouth daily.   clopidogrel 75 MG tablet Commonly known as: PLAVIX TAKE 1 TABLET(75 MG) BY MOUTH DAILY   dorzolamide-timolol 2-0.5 % ophthalmic solution Commonly known as: COSOPT Place 1 drop into both eyes 2 (two) times daily.   fluticasone 50 MCG/ACT nasal  spray Commonly known as: FLONASE SHAKE LIQUID AND USE 1 SPRAY IN EACH NOSTRIL DAILY AS NEEDED FOR ALLERGIES OR RHINITIS   Iron (Ferrous Sulfate) 325 (65 Fe) MG Tabs Take 325 mg by mouth every other day.   Jardiance 10 MG Tabs tablet Generic drug: empagliflozin TAKE 1 TABLET(10 MG) BY MOUTH DAILY   latanoprost 0.005 % ophthalmic  solution Commonly known as: XALATAN Place 1 drop into both eyes at bedtime.   levofloxacin 750 MG tablet Commonly known as: LEVAQUIN Take 1 tablet (750 mg total) by mouth daily at 6 PM for 12 days.   metoprolol succinate 25 MG 24 hr tablet Commonly known as: Toprol XL Take 1 tablet (25 mg total) by mouth daily.   metroNIDAZOLE 500 MG tablet Commonly known as: FLAGYL Take 1 tablet (500 mg total) by mouth every 12 (twelve) hours for 12 days.   ondansetron 4 MG disintegrating tablet Commonly known as: ZOFRAN-ODT Take 1 tablet (4 mg total) by mouth every 8 (eight) hours as needed for nausea or vomiting.   ondansetron 4 MG tablet Commonly known as: ZOFRAN Take 1 tablet (4 mg total) by mouth every 6 (six) hours as needed for nausea.   pantoprazole 40 MG tablet Commonly known as: PROTONIX TAKE 1 TABLET(40 MG) BY MOUTH EVERY MORNING   polyethylene glycol 17 g packet Commonly known as: MIRALAX / GLYCOLAX Take 17 g by mouth daily as needed for mild constipation.   potassium chloride SA 20 MEQ tablet Commonly known as: KLOR-CON M TAKE 1 TABLET(20 MEQ) BY MOUTH DAILY   tiZANidine 2 MG tablet Commonly known as: ZANAFLEX Take 2 mg by mouth 2 (two) times daily as needed.   Vitamin B-12 CR 1000 MCG Tbcr Take 1 tablet by mouth every evening.   VITAMIN C PO Take 4,000 mg by mouth daily.   Zinc Acetate 50 MG Caps Take 50 mg by mouth every evening.        Follow-up Information     [name], DO. Schedule an appointment as soon as possible for a visit in 1 week(s).   Specialty: Family Medicine Contact information: [address] [place] [address] [phone]         [facility]. Go to.   Contact information: [address] [place] [address] [phone]               Allergies  Allergen Reactions   Lidocaine Hives    Rash    Consultations: GI ID General Surgery   Procedures/Studies: DG  CHEST PORT 1 VIEW Result Date: 10/05/2023 CLINICAL DATA:  Leukocytosis.  Abnormal chest x-ray. EXAM: PORTABLE CHEST 1 VIEW COMPARISON:  October 04, 2023. FINDINGS: Stable cardiomediastinal silhouette. Minimally increased reticular densities are noted throughout both lungs suggesting worsening edema or possibly atypical inflammation. Bony thorax is unremarkable. IMPRESSION: Minimally increased reticular densities are noted bilaterally as described above. Electronically Signed   By: Lupita Raider M.D.   On: 10/05/2023 13:40   MR ABDOMEN W WO CONTRAST Result Date: 10/04/2023 CLINICAL DATA:  Right upper quadrant abdominal pain, IPMN suspected EXAM: MRI ABDOMEN WITHOUT AND WITH CONTRAST TECHNIQUE: Multiplanar multisequence MR imaging of the abdomen was performed both before and after the administration of intravenous contrast. CONTRAST:  10mL GADAVIST GADOBUTROL 1 MMOL/ML IV SOLN COMPARISON:  CT abdomen pelvis, 10/04/2023 FINDINGS: Lower chest: No acute abnormality.  Trace pleural effusions. Hepatobiliary: Coarse contour of the liver. No focal liver lesion. Small gallstones. No gallbladder wall thickening, or  biliary dilatation. Pancreas: Tiny fluid signal cystic lesion within the pancreatic body measuring 0.4 cm (series 4, image 16). No solid component or suspicious contrast enhancement no pancreatic ductal dilatation or surrounding inflammatory changes. Spleen: Normal in size without significant abnormality. Adrenals/Urinary Tract: Adrenal glands are unremarkable. Kidneys are normal, without renal calculi, solid lesion, or hydronephrosis. Stomach/Bowel: Stomach is within normal limits. No evidence of bowel wall thickening, distention, or inflammatory changes. Vascular/Lymphatic: Aortic atherosclerosis. Prominent portacaval lymph nodes, likely reactive in the setting of cirrhosis. Other: No abdominal wall hernia or abnormality. Small volume ascites. Musculoskeletal: No acute or significant osseous findings.  IMPRESSION: 1. Coarse contour of the liver, suggestive of cirrhosis. No focal liver lesion. 2. Small volume ascites. 3. Cholelithiasis without evidence of acute cholecystitis. 4. Tiny fluid signal cystic lesion within the pancreatic body measuring 0.4 cm. No solid component or suspicious contrast enhancement. No pancreatic ductal dilatation or surrounding inflammatory changes. 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 Given tiny size and patient age, no specific further follow-up or characterization is required. 5. Trace pleural effusions. Aortic Atherosclerosis (ICD10-I70.0). Electronically Signed   By: Jearld Lesch M.D.   On: 10/04/2023 21:35   MR 3D Recon At Scanner Result Date: 10/04/2023 CLINICAL DATA:  Right upper quadrant abdominal pain, IPMN suspected EXAM: MRI ABDOMEN WITHOUT AND WITH CONTRAST TECHNIQUE: Multiplanar multisequence MR imaging of the abdomen was performed both before and after the administration of intravenous contrast. CONTRAST:  10mL GADAVIST GADOBUTROL 1 MMOL/ML IV SOLN COMPARISON:  CT abdomen pelvis, 10/04/2023 FINDINGS: Lower chest: No acute abnormality.  Trace pleural effusions. Hepatobiliary: Coarse contour of the liver. No focal liver lesion. Small gallstones. No gallbladder wall thickening, or biliary dilatation. Pancreas: Tiny fluid signal cystic lesion within the pancreatic body measuring 0.4 cm (series 4, image 16). No solid component or suspicious contrast enhancement no pancreatic ductal dilatation or surrounding inflammatory changes. Spleen: Normal in size without significant abnormality. Adrenals/Urinary Tract: Adrenal glands are unremarkable. Kidneys are normal, without renal calculi, solid lesion, or hydronephrosis. Stomach/Bowel: Stomach is within normal limits. No evidence of bowel wall thickening, distention, or inflammatory changes. Vascular/Lymphatic: Aortic atherosclerosis. Prominent portacaval lymph nodes, likely reactive in the setting of  cirrhosis. Other: No abdominal wall hernia or abnormality. Small volume ascites. Musculoskeletal: No acute or significant osseous findings. IMPRESSION: 1. Coarse contour of the liver, suggestive of cirrhosis. No focal liver lesion. 2. Small volume ascites. 3. Cholelithiasis without evidence of acute cholecystitis. 4. Tiny fluid signal cystic lesion within the pancreatic body measuring 0.4 cm. No solid component or suspicious contrast enhancement. No pancreatic ductal dilatation or surrounding inflammatory changes. 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 Given tiny size and patient age, no specific further follow-up or characterization is required. 5. Trace pleural effusions. Aortic Atherosclerosis (ICD10-I70.0). Electronically Signed   By: Jearld Lesch M.D.   On: 10/04/2023 21:35   CT ABDOMEN PELVIS W CONTRAST Result Date: 10/04/2023 CLINICAL DATA:  Acute onset abdominal pain radiating to the lower back EXAM: CT ABDOMEN AND PELVIS WITH CONTRAST TECHNIQUE: Multidetector CT imaging of the abdomen and pelvis was performed using the standard protocol following bolus administration of intravenous contrast. RADIATION DOSE REDUCTION: This exam was performed according to the departmental dose-optimization program which includes automated exposure control, adjustment of the mA and/or kV according to patient size and/or use of iterative reconstruction technique. CONTRAST:  OMNIPAQUE IOHEXOL 300 MG/ML  SOLN COMPARISON:  CT abdomen and pelvis dated 11/21/2015 FINDINGS: Lower chest:  No focal consolidation or pulmonary nodule in the lung bases. No pleural effusion or pneumothorax demonstrated. Partially imaged heart size is normal. Coronary artery calcifications. Hepatobiliary: Cirrhotic morphology. Subcentimeter segment 5/6 hypodensity (2:25), too small to characterize. Mild periportal edema. No intra or extrahepatic biliary ductal dilation. Cholelithiasis. Pancreas: 4 mm hypodensity in the  pancreatic body (4:51), too small to characterize. No main pancreatic ductal dilation. Spleen: Normal in size without focal abnormality. Adrenals/Urinary Tract: No adrenal nodules. No suspicious renal mass, calculi or hydronephrosis. No focal bladder wall thickening. Stomach/Bowel: Minimally dilated fluid-filled esophagus. Normal appearance of the stomach. Mild circumferential mural thickening of the ascending colon. A few mildly dilated loops of right hemi abdominal small bowel with interspersed areas of mild circumferential mural thickening. Moderate volume stool in the rectum. Colonic diverticulosis without acute diverticulitis. Appendectomy. Vascular/Lymphatic: Aortic atherosclerosis. Upper abdominal varices. 14 mm periportal lymph node (2:26). Reproductive: Prostate is unremarkable. Other: Small volume ascites. No free air or fluid collection. A few scattered punctate calcifications along the inferior right hepatic lobe (2:34), unchanged. Musculoskeletal: No acute or abnormal lytic or blastic osseous lesions. Multilevel degenerative changes of the partially imaged thoracic and lumbar spine. Small fat-containing left inguinal hernia. IMPRESSION: 1. Mild circumferential mural thickening of the ascending colon and a few mildly dilated loops of right hemi abdominal small bowel with interspersed areas of mild circumferential mural thickening may represent infectious/inflammatory enterocolitis or portal enterocolopathy. 2. 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 3. Cholelithiasis. 4. 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 No main ductal dilation, mass lesion, or abnormal enhancement. Follow-up imaging in 2 years can be considered with contrast-enhanced MRI/MRCP if the patient is a surgical candidate. Alternatively, attention on follow-up. 5.   Aortic Atherosclerosis (ICD10-I70.0). Electronically Signed   By: Agustin Cree M.D.   On: 10/04/2023 14:56   DG Chest Port 1 View Result Date: 10/04/2023 CLINICAL DATA:  Chest pain EXAM: PORTABLE CHEST 1 VIEW COMPARISON:  X-ray 07/16/2015 FINDINGS: No consolidation, pneumothorax or effusion. No edema. Normal cardiopericardial silhouette. Increasing interstitial changes. Overlapping cardiac leads. Degenerative changes along the spine. IMPRESSION: Increasing interstitial changes of the lungs. Acute versus chronic. Please correlate with clinical presentation and recommend short follow-up. Electronically Signed   By: Karen Kays M.D.   On: 10/04/2023 12:20   ECHOCARDIOGRAM COMPLETE Result Date: 09/22/2023    ECHOCARDIOGRAM REPORT   Patient Name:   DARRIC TULLAR Date of Exam: 09/22/2023 Medical Rec #:  [id number]        Height:       74.0 in Accession #:    [id number]       Weight:       235.2 lb Date of Birth:  Nov 30, 1945         BSA:          2.329 m Patient Age:    77 years         BP:           134/80 mmHg Patient Gender: M                HR:           60 bpm. Exam Location:  [address] Procedure: 2D Echo, 3D Echo, Cardiac Doppler and Color Doppler Indications:    R06.09 Dyspnea  History:        Patient has prior history of Echocardiogram examinations, most  recent 07/18/2015. CAD and Previous Myocardial Infarction, COPD                 and Stroke, Aortic Valve Disease, Signs/Symptoms:Dyspnea; Risk                 Factors:Former Smoker, Dyslipidemia, Hypertension and Family                 History of Coronary Artery Disease.  Sonographer:    Farrel Conners RDCS Referring Phys: [name] IMPRESSIONS  1. Left ventricular ejection fraction, by estimation, is 55 to 60%. The left ventricle has normal function. The left ventricle has no regional wall motion abnormalities. Left ventricular diastolic parameters were normal.  2. Right ventricular systolic function is normal. The right ventricular  size is normal.  3. Left atrial size was moderately dilated.  4. Right atrial size was mildly dilated.  5. The mitral valve is abnormal. Mild mitral valve regurgitation. No evidence of mitral stenosis. Moderate mitral annular calcification.  6. Mean gradient 9.3 peak 17.5 mmHg DVI 0.54 and AVA 2.3 cm2 . The aortic valve is tricuspid. There is moderate calcification of the aortic valve. Aortic valve regurgitation is not visualized. Aortic valve sclerosis/calcification is present, without any  evidence of aortic stenosis.  7. Aortic dilatation noted. There is mild dilatation of the aortic root, measuring 39 mm.  8. The inferior vena cava is normal in size with greater than 50% respiratory variability, suggesting right atrial pressure of 3 mmHg. FINDINGS  Left Ventricle: Left ventricular ejection fraction, by estimation, is 55 to 60%. The left ventricle has normal function. The left ventricle has no regional wall motion abnormalities. The left ventricular internal cavity size was normal in size. There is  no left ventricular hypertrophy. Left ventricular diastolic parameters were normal. Right Ventricle: The right ventricular size is normal. No increase in right ventricular wall thickness. Right ventricular systolic function is normal. Left Atrium: Left atrial size was moderately dilated. Right Atrium: Right atrial size was mildly dilated. Pericardium: There is no evidence of pericardial effusion. Mitral Valve: The mitral valve is abnormal. There is moderate thickening of the mitral valve leaflet(s). There is moderate calcification of the mitral valve leaflet(s). Moderate mitral annular calcification. Mild mitral valve regurgitation. No evidence of mitral valve stenosis. Tricuspid Valve: The tricuspid valve is normal in structure. Tricuspid valve regurgitation is mild . No evidence of tricuspid stenosis. Aortic Valve: Mean gradient 9.3 peak 17.5 mmHg DVI 0.54 and AVA 2.3 cm2. The aortic valve is tricuspid. There is  moderate calcification of the aortic valve. Aortic valve regurgitation is not visualized. Aortic valve sclerosis/calcification is present, without any evidence of aortic stenosis. Aortic valve mean gradient measures 9.3 mmHg. Aortic valve peak gradient measures 17.5 mmHg. Aortic valve area, by VTI measures 2.46 cm. Pulmonic Valve: The pulmonic valve was normal in structure. Pulmonic valve regurgitation is mild. No evidence of pulmonic stenosis. Aorta: Aortic dilatation noted. There is mild dilatation of the aortic root, measuring 39 mm. Venous: The inferior vena cava is normal in size with greater than 50% respiratory variability, suggesting right atrial pressure of 3 mmHg. IAS/Shunts: No atrial level shunt detected by color flow Doppler.  LEFT VENTRICLE PLAX 2D LVIDd:         5.20 cm   Diastology LVIDs:         3.10 cm   LV e' medial:    6.64 cm/s LV PW:         1.00 cm  LV E/e' medial:  15.2 LV IVS:        1.00 cm   LV e' lateral:   9.03 cm/s LVOT diam:     2.40 cm   LV E/e' lateral: 11.2 LV SV:         126 LV SV Index:   54 LVOT Area:     4.52 cm                           3D Volume EF:                          3D EF:        61 %                          LV EDV:       162 ml                          LV ESV:       63 ml                          LV SV:        99 ml RIGHT VENTRICLE RV Basal diam:  4.30 cm RV Mid diam:    3.50 cm RV S prime:     13.20 cm/s TAPSE (M-mode): 2.2 cm RVSP:           31.3 mmHg LEFT ATRIUM              Index        RIGHT ATRIUM           Index LA diam:        4.90 cm  2.10 cm/m   RA Pressure: 3.00 mmHg LA Vol (A2C):   83.6 ml  35.90 ml/m  RA Area:     25.80 cm LA Vol (A4C):   117.0 ml 50.25 ml/m  RA Volume:   82.10 ml  35.26 ml/m LA Biplane Vol: 107.0 ml 45.95 ml/m  AORTIC VALVE AV Area (Vmax):    2.43 cm AV Area (Vmean):   2.31 cm AV Area (VTI):     2.46 cm AV Vmax:           209.33 cm/s AV Vmean:          142.333 cm/s AV VTI:            0.511 m AV Peak Grad:      17.5 mmHg AV  Mean Grad:      9.3 mmHg LVOT Vmax:         112.50 cm/s LVOT Vmean:        72.600 cm/s LVOT VTI:          0.278 m LVOT/AV VTI ratio: 0.54  AORTA Ao Root diam: 3.90 cm Ao Asc diam:  3.60 cm MITRAL VALVE                TRICUSPID VALVE MV Area (PHT): cm          TR Peak grad:   28.3 mmHg MV Decel Time: 229 msec     TR Vmax:        266.00 cm/s MV E velocity: 101.00 cm/s  Estimated RAP:  3.00 mmHg MV A velocity: 85.00 cm/s   RVSP:  31.3 mmHg MV E/A ratio:  1.19                             SHUNTS                             Systemic VTI:  0.28 m                             Systemic Diam: 2.40 cm Charlton Haws MD Electronically signed by Charlton Haws MD Signature Date/Time: 09/22/2023/3:02:24 PM    Final      Discharge Exam: Vitals:   10/07/23 1128 10/07/23 1457  BP:  (!) 157/88  Pulse:  65  Resp:    Temp:  98.4 F (36.9 C)  SpO2: 95% 92%   Vitals:   10/07/23 0510 10/07/23 0852 10/07/23 1128 10/07/23 1457  BP: 130/60 (!) 162/82  (!) 157/88  Pulse: 60 60  65  Resp:      Temp: 98.1 F (36.7 C)   98.4 F (36.9 C)  TempSrc: Oral   Oral  SpO2: 94%  95% 92%  Weight:      Height:        General: Pt is alert, awake, not in acute distress Cardiovascular: RRR, S1/S2 +, no rubs, no gallops Respiratory: CTA bilaterally, no wheezing, no rhonchi Abdominal: Soft, NT, ND, bowel sounds + Extremities: no edema, no cyanosis    The results of significant diagnostics from this hospitalization (including imaging, microbiology, ancillary and laboratory) are listed below for reference.     Microbiology: Recent Results (from the past 240 hours)  Blood culture (routine x 2)     Status: Abnormal   Collection Time: 10/04/23  3:25 PM   Specimen: BLOOD  Result Value Ref Range Status   Specimen Description   Final    BLOOD RIGHT ANTECUBITAL Performed at [facility], [address], [place], [place] [address]    Special Requests   Final    BOTTLES DRAWN AEROBIC ONLY Blood Culture adequate  volume Performed at [facility], [address] [place], [place] [address]    Culture  Setup Time   Final    GRAM NEGATIVE RODS AEROBIC BOTTLE ONLY CRITICAL VALUE NOTED.  VALUE IS CONSISTENT WITH PREVIOUSLY REPORTED AND CALLED VALUE. Performed at [facility], [address] [place], [place] [address]    Culture (A)  Final    SERRATIA LIQUEFACIENS SUSCEPTIBILITIES PERFORMED ON PREVIOUS CULTURE WITHIN THE LAST 5 DAYS. Performed at [facility], [address], [place], [place] [address]    Report Status 10/07/2023 FINAL  Final  Blood culture (routine x 2)     Status: Abnormal   Collection Time: 10/04/23  3:25 PM   Specimen: BLOOD  Result Value Ref Range Status   Specimen Description   Final    BLOOD BLOOD LEFT HAND Performed at [facility], [address] [place], [place] [address]    Special Requests   Final    BOTTLES DRAWN AEROBIC ONLY Blood Culture results may not be optimal due to an inadequate volume of blood received in culture bottles Performed at [facility], [address] [place], [place] [address]    Culture  Setup Time   Final    GRAM NEGATIVE RODS AEROBIC BOTTLE ONLY CRITICAL RESULT CALLED TO, READ BACK BY AND VERIFIED WITH: MICHELLE CATES  LAND AT 1006 10/05/2023 BY T KENNEDY CRITICAL RESULT CALLED TO, READ BACK BY AND VERIFIED WITH: PHARMD F.WILSON AT 1447 AT 10/05/2023 BY T.SAAD. Performed at [facility], [address], [place], [place] [address]    Culture SERRATIA LIQUEFACIENS (A)  Final   Report Status 10/07/2023 FINAL  Final   Organism ID, Bacteria SERRATIA LIQUEFACIENS  Final      Susceptibility   Serratia liquefaciens - MIC*    CEFEPIME <=0.12 SENSITIVE Sensitive     CEFTAZIDIME <=1 SENSITIVE Sensitive     CEFTRIAXONE <=0.25 SENSITIVE Sensitive     CIPROFLOXACIN <=0.25 SENSITIVE Sensitive     GENTAMICIN <=1 SENSITIVE Sensitive     IMIPENEM 1 SENSITIVE Sensitive     TRIMETH/SULFA <=20 SENSITIVE Sensitive     PIP/TAZO <=4 SENSITIVE  Sensitive ug/mL    * SERRATIA LIQUEFACIENS  Blood Culture ID Panel (Reflexed)     Status: Abnormal   Collection Time: 10/04/23  3:25 PM  Result Value Ref Range Status   Enterococcus faecalis NOT DETECTED NOT DETECTED Final   Enterococcus Faecium NOT DETECTED NOT DETECTED Final   Listeria monocytogenes NOT DETECTED NOT DETECTED Final   Staphylococcus species NOT DETECTED NOT DETECTED Final   Staphylococcus aureus (BCID) NOT DETECTED NOT DETECTED Final   Staphylococcus epidermidis NOT DETECTED NOT DETECTED Final   Staphylococcus lugdunensis NOT DETECTED NOT DETECTED Final   Streptococcus species NOT DETECTED NOT DETECTED Final   Streptococcus agalactiae NOT DETECTED NOT DETECTED Final   Streptococcus pneumoniae NOT DETECTED NOT DETECTED Final   Streptococcus pyogenes NOT DETECTED NOT DETECTED Final   A.calcoaceticus-baumannii NOT DETECTED NOT DETECTED Final   Bacteroides fragilis NOT DETECTED NOT DETECTED Final   Enterobacterales DETECTED (A) NOT DETECTED Final    Comment: Enterobacterales represent a large order of gram negative bacteria, not a single organism. Refer to culture for further identification. CRITICAL RESULT CALLED TO, READ BACK BY AND VERIFIED WITH: PHARMD F.WILSON AT 1447 AT 10/05/2023 BY T.SAAD.    Enterobacter cloacae complex NOT DETECTED NOT DETECTED Final   Escherichia coli NOT DETECTED NOT DETECTED Final   Klebsiella aerogenes NOT DETECTED NOT DETECTED Final   Klebsiella oxytoca NOT DETECTED NOT DETECTED Final   Klebsiella pneumoniae NOT DETECTED NOT DETECTED Final   Proteus species NOT DETECTED NOT DETECTED Final   Salmonella species NOT DETECTED NOT DETECTED Final   Serratia marcescens NOT DETECTED NOT DETECTED Final   Haemophilus influenzae NOT DETECTED NOT DETECTED Final   Neisseria meningitidis NOT DETECTED NOT DETECTED Final   Pseudomonas aeruginosa NOT DETECTED NOT DETECTED Final   Stenotrophomonas maltophilia NOT DETECTED NOT DETECTED Final   Candida  albicans NOT DETECTED NOT DETECTED Final   Candida auris NOT DETECTED NOT DETECTED Final   Candida glabrata NOT DETECTED NOT DETECTED Final   Candida krusei NOT DETECTED NOT DETECTED Final   Candida parapsilosis NOT DETECTED NOT DETECTED Final   Candida tropicalis NOT DETECTED NOT DETECTED Final   Cryptococcus neoformans/gattii NOT DETECTED NOT DETECTED Final   CTX-M ESBL NOT DETECTED NOT DETECTED Final   Carbapenem resistance IMP NOT DETECTED NOT DETECTED Final   Carbapenem resistance KPC DETECTED (A) NOT DETECTED Final    Comment: CRITICAL RESULT CALLED TO, READ BACK BY AND VERIFIED WITH: PHARMD F.WILSON AT 1447 AT 10/05/2023 BY T.SAAD. (NOTE) Carbapenemase resistant enterobacterales detected. Recommend ceftazidime/avibactam as intitial therapy.     Carbapenem resistance NDM NOT DETECTED NOT DETECTED Final   Carbapenem resist OXA 48 LIKE NOT DETECTED NOT DETECTED Final  Carbapenem resistance VIM NOT DETECTED NOT DETECTED Final    Comment: Performed at [facility], [address], [place], [place] [address]  SARS Coronavirus 2 by RT PCR (hospital order, performed in [facility] hospital lab) *cepheid single result test* Anterior Nasal Swab     Status: None   Collection Time: 10/04/23  8:18 PM   Specimen: Anterior Nasal Swab  Result Value Ref Range Status   SARS Coronavirus 2 by RT PCR NEGATIVE NEGATIVE Final    Comment: (NOTE) SARS-CoV-2 target nucleic acids are NOT DETECTED.  The SARS-CoV-2 RNA is generally detectable in upper and lower respiratory specimens during the acute phase of infection. The lowest concentration of SARS-CoV-2 viral copies this assay can detect is 250 copies / mL. A negative result does not preclude SARS-CoV-2 infection and should not be used as the sole basis for treatment or other patient management decisions.  A negative result may occur with improper specimen collection / handling, submission of specimen other than nasopharyngeal swab, presence  of viral mutation(s) within the areas targeted by this assay, and inadequate number of viral copies (<250 copies / mL). A negative result must be combined with clinical observations, patient history, and epidemiological information.  Fact Sheet for Patients:   RoadLapTop.co.za  Fact Sheet for Healthcare Providers: http://kim-miller.com/  This test is not yet approved or  cleared by the [place] FDA and has been authorized for detection and/or diagnosis of SARS-CoV-2 by FDA under an Emergency Use Authorization (EUA).  This EUA will remain in effect (meaning this test can be used) for the duration of the COVID-19 declaration under Section 564(b)(1) of the Act, 21 U.S.C. section 360bbb-3(b)(1), unless the authorization is terminated or revoked sooner.  Performed at [facility], [address] [place], [place] [address]   Culture, blood (Routine X 2) w Reflex to ID Panel     Status: None (Preliminary result)   Collection Time: 10/05/23  3:58 PM   Specimen: BLOOD  Result Value Ref Range Status   Specimen Description BLOOD BLOOD RIGHT HAND  Final   Special Requests   Final    BOTTLES DRAWN AEROBIC AND ANAEROBIC Blood Culture results may not be optimal due to an inadequate volume of blood received in culture bottles   Culture   Final    NO GROWTH 2 DAYS Performed at [facility], [address], [place], [place] [address]    Report Status PENDING  Incomplete  Culture, blood (Routine X 2) w Reflex to ID Panel     Status: None (Preliminary result)   Collection Time: 10/05/23  4:17 PM   Specimen: BLOOD  Result Value Ref Range Status   Specimen Description BLOOD BLOOD RIGHT HAND  Final   Special Requests   Final    BOTTLES DRAWN AEROBIC ONLY Blood Culture results may not be optimal due to an inadequate volume of blood received in culture bottles   Culture   Final    NO GROWTH 2 DAYS Performed at [facility], [address]  [place], [place] [address]    Report Status PENDING  Incomplete     Labs: BNP (last 3 results) Recent Labs    10/04/23 1851  BNP 1,308.0*   Basic Metabolic Panel: Recent Labs  Lab 10/04/23 1127 10/05/23 0459 10/06/23 0457 10/07/23 0429  NA 137 131* 129* 129*  K 4.5 4.8 3.6 3.5  CL 106 105 100 101  CO2 18* 16* 18* 19*  GLUCOSE 95 69* 73 77  BUN 9 15 19 12   CREATININE 1.49* 1.33* 1.19 0.86  CALCIUM 9.5 8.2* 8.1* 8.2*  MG  --   --   --  1.7   Liver Function Tests: Recent Labs  Lab 10/04/23 1127 10/05/23 0459 10/05/23 0921 10/06/23 0457 10/07/23 0429  AST 194* 98*  --  61* 70*  ALT 120* 88*  --  66* 66*  ALKPHOS 94 72  --  78 110  BILITOT 4.6* 6.9* 3.0* 6.2* 6.1*  PROT 6.1* 5.3*  --  5.2* 5.4*  ALBUMIN 2.8* 2.4*  --  2.2* 2.3*   Recent Labs  Lab 10/04/23 1127 10/06/23 0457  LIPASE 37 55*   No results for input(s): "AMMONIA" in the last 168 hours. CBC: Recent Labs  Lab 10/04/23 1127 10/05/23 0459 10/06/23 0457 10/07/23 0429  WBC 18.4* 25.3* 12.5* 8.8  NEUTROABS  --   --  10.7* 6.8  HGB 17.3* 14.0 12.9* 13.8  HCT 53.7* 42.6 38.1* 39.4  MCV 95.0 93.6 91.6 88.9  PLT 158 131* 100* 96*   Cardiac Enzymes: No results for input(s): "CKTOTAL", "CKMB", "CKMBINDEX", "TROPONINI" in the last 168 hours. BNP: Invalid input(s): "POCBNP" CBG: Recent Labs  Lab 10/06/23 1635 10/06/23 2209 10/06/23 2333 10/07/23 0806 10/07/23 1205  GLUCAP 103* 91 93 79 174*   D-Dimer No results for input(s): "DDIMER" in the last 72 hours. Hgb A1c No results for input(s): "HGBA1C" in the last 72 hours. Lipid Profile No results for input(s): "CHOL", "HDL", "LDLCALC", "TRIG", "CHOLHDL", "LDLDIRECT" in the last 72 hours. Thyroid function studies No results for input(s): "TSH", "T4TOTAL", "T3FREE", "THYROIDAB" in the last 72 hours.  Invalid input(s): "FREET3" Anemia work up Recent Labs    10/04/23 1851  FERRITIN 127  TIBC 250  IRON 32*   Urinalysis    Component Value  Date/Time   COLORURINE AMBER (A) 10/04/2023 1442   APPEARANCEUR CLEAR 10/04/2023 1442   LABSPEC >1.046 (H) 10/04/2023 1442   PHURINE 6.0 10/04/2023 1442   GLUCOSEU 150 (A) 10/04/2023 1442   HGBUR NEGATIVE 10/04/2023 1442   BILIRUBINUR NEGATIVE 10/04/2023 1442   KETONESUR NEGATIVE 10/04/2023 1442   PROTEINUR 100 (A) 10/04/2023 1442   NITRITE NEGATIVE 10/04/2023 1442   LEUKOCYTESUR NEGATIVE 10/04/2023 1442   Sepsis Labs Recent Labs  Lab 10/04/23 1127 10/05/23 0459 10/06/23 0457 10/07/23 0429  WBC 18.4* 25.3* 12.5* 8.8   Microbiology Recent Results (from the past 240 hours)  Blood culture (routine x 2)     Status: Abnormal   Collection Time: 10/04/23  3:25 PM   Specimen: BLOOD  Result Value Ref Range Status   Specimen Description   Final    BLOOD RIGHT ANTECUBITAL Performed at [facility], [address], [place], [place] [address]    Special Requests   Final    BOTTLES DRAWN AEROBIC ONLY Blood Culture adequate volume Performed at [facility], [address] [place], [place] [address]    Culture  Setup Time   Final    GRAM NEGATIVE RODS AEROBIC BOTTLE ONLY CRITICAL VALUE NOTED.  VALUE IS CONSISTENT WITH PREVIOUSLY REPORTED AND CALLED VALUE. Performed at [facility], [address] [place], [place] [address]    Culture (A)  Final    SERRATIA LIQUEFACIENS SUSCEPTIBILITIES PERFORMED ON PREVIOUS CULTURE WITHIN THE LAST 5 DAYS. Performed at [facility], [address], [place], [place] [address]    Report Status 10/07/2023 FINAL  Final  Blood culture (routine x 2)     Status: Abnormal  Collection Time: 10/04/23  3:25 PM   Specimen: BLOOD  Result Value Ref Range Status   Specimen Description   Final    BLOOD BLOOD LEFT HAND Performed at [facility], [address], [place], [place] [address]    Special Requests   Final    BOTTLES DRAWN AEROBIC ONLY Blood Culture results may not be optimal due to an inadequate volume of blood received in culture  bottles Performed at [facility], [address] [place], [place] [address]    Culture  Setup Time   Final    GRAM NEGATIVE RODS AEROBIC BOTTLE ONLY CRITICAL RESULT CALLED TO, READ BACK BY AND VERIFIED WITH: MICHELLE CATES LAND AT 1006 10/05/2023 BY T KENNEDY CRITICAL RESULT CALLED TO, READ BACK BY AND VERIFIED WITH: PHARMD F.WILSON AT 1447 AT 10/05/2023 BY T.SAAD. Performed at [facility], [address], [place], [place] [address]    Culture SERRATIA LIQUEFACIENS (A)  Final   Report Status 10/07/2023 FINAL  Final   Organism ID, Bacteria SERRATIA LIQUEFACIENS  Final      Susceptibility   Serratia liquefaciens - MIC*    CEFEPIME <=0.12 SENSITIVE Sensitive     CEFTAZIDIME <=1 SENSITIVE Sensitive     CEFTRIAXONE <=0.25 SENSITIVE Sensitive     CIPROFLOXACIN <=0.25 SENSITIVE Sensitive     GENTAMICIN <=1 SENSITIVE Sensitive     IMIPENEM 1 SENSITIVE Sensitive     TRIMETH/SULFA <=20 SENSITIVE Sensitive     PIP/TAZO <=4 SENSITIVE Sensitive ug/mL    * SERRATIA LIQUEFACIENS  Blood Culture ID Panel (Reflexed)     Status: Abnormal   Collection Time: 10/04/23  3:25 PM  Result Value Ref Range Status   Enterococcus faecalis NOT DETECTED NOT DETECTED Final   Enterococcus Faecium NOT DETECTED NOT DETECTED Final   Listeria monocytogenes NOT DETECTED NOT DETECTED Final   Staphylococcus species NOT DETECTED NOT DETECTED Final   Staphylococcus aureus (BCID) NOT DETECTED NOT DETECTED Final   Staphylococcus epidermidis NOT DETECTED NOT DETECTED Final   Staphylococcus lugdunensis NOT DETECTED NOT DETECTED Final   Streptococcus species NOT DETECTED NOT DETECTED Final   Streptococcus agalactiae NOT DETECTED NOT DETECTED Final   Streptococcus pneumoniae NOT DETECTED NOT DETECTED Final   Streptococcus pyogenes NOT DETECTED NOT DETECTED Final   A.calcoaceticus-baumannii NOT DETECTED NOT DETECTED Final   Bacteroides fragilis NOT DETECTED NOT DETECTED Final   Enterobacterales DETECTED (A) NOT  DETECTED Final    Comment: Enterobacterales represent a large order of gram negative bacteria, not a single organism. Refer to culture for further identification. CRITICAL RESULT CALLED TO, READ BACK BY AND VERIFIED WITH: PHARMD F.WILSON AT 1447 AT 10/05/2023 BY T.SAAD.    Enterobacter cloacae complex NOT DETECTED NOT DETECTED Final   Escherichia coli NOT DETECTED NOT DETECTED Final   Klebsiella aerogenes NOT DETECTED NOT DETECTED Final   Klebsiella oxytoca NOT DETECTED NOT DETECTED Final   Klebsiella pneumoniae NOT DETECTED NOT DETECTED Final   Proteus species NOT DETECTED NOT DETECTED Final   Salmonella species NOT DETECTED NOT DETECTED Final   Serratia marcescens NOT DETECTED NOT DETECTED Final   Haemophilus influenzae NOT DETECTED NOT DETECTED Final   Neisseria meningitidis NOT DETECTED NOT DETECTED Final   Pseudomonas aeruginosa NOT DETECTED NOT DETECTED Final   Stenotrophomonas maltophilia NOT DETECTED NOT DETECTED Final   Candida albicans NOT DETECTED NOT DETECTED Final   Candida auris NOT DETECTED NOT DETECTED Final   Candida glabrata NOT DETECTED NOT DETECTED Final   Candida krusei NOT DETECTED NOT  DETECTED Final   Candida parapsilosis NOT DETECTED NOT DETECTED Final   Candida tropicalis NOT DETECTED NOT DETECTED Final   Cryptococcus neoformans/gattii NOT DETECTED NOT DETECTED Final   CTX-M ESBL NOT DETECTED NOT DETECTED Final   Carbapenem resistance IMP NOT DETECTED NOT DETECTED Final   Carbapenem resistance KPC DETECTED (A) NOT DETECTED Final    Comment: CRITICAL RESULT CALLED TO, READ BACK BY AND VERIFIED WITH: PHARMD F.WILSON AT 1447 AT 10/05/2023 BY T.SAAD. (NOTE) Carbapenemase resistant enterobacterales detected. Recommend ceftazidime/avibactam as intitial therapy.     Carbapenem resistance NDM NOT DETECTED NOT DETECTED Final   Carbapenem resist OXA 48 LIKE NOT DETECTED NOT DETECTED Final   Carbapenem resistance VIM NOT DETECTED NOT DETECTED Final    Comment:  Performed at [facility], [address] [place], [place] [address]  SARS Coronavirus 2 by RT PCR (hospital order, performed in [facility] hospital lab) *cepheid single result test* Anterior Nasal Swab     Status: None   Collection Time: 10/04/23  8:18 PM   Specimen: Anterior Nasal Swab  Result Value Ref Range Status   SARS Coronavirus 2 by RT PCR NEGATIVE NEGATIVE Final    Comment: (NOTE) SARS-CoV-2 target nucleic acids are NOT DETECTED.  The SARS-CoV-2 RNA is generally detectable in upper and lower respiratory specimens during the acute phase of infection. The lowest concentration of SARS-CoV-2 viral copies this assay can detect is 250 copies / mL. A negative result does not preclude SARS-CoV-2 infection and should not be used as the sole basis for treatment or other patient management decisions.  A negative result may occur with improper specimen collection / handling, submission of specimen other than nasopharyngeal swab, presence of viral mutation(s) within the areas targeted by this assay, and inadequate number of viral copies (<250 copies / mL). A negative result must be combined with clinical observations, patient history, and epidemiological information.  Fact Sheet for Patients:   RoadLapTop.co.za  Fact Sheet for Healthcare Providers: http://kim-miller.com/  This test is not yet approved or  cleared by the [place] FDA and has been authorized for detection and/or diagnosis of SARS-CoV-2 by FDA under an Emergency Use Authorization (EUA).  This EUA will remain in effect (meaning this test can be used) for the duration of the COVID-19 declaration under Section 564(b)(1) of the Act, 21 U.S.C. section 360bbb-3(b)(1), unless the authorization is terminated or revoked sooner.  Performed at [facility], [address] [place], [place] [address]   Culture, blood (Routine X 2) w Reflex to ID Panel     Status: None  (Preliminary result)   Collection Time: 10/05/23  3:58 PM   Specimen: BLOOD  Result Value Ref Range Status   Specimen Description BLOOD BLOOD RIGHT HAND  Final   Special Requests   Final    BOTTLES DRAWN AEROBIC AND ANAEROBIC Blood Culture results may not be optimal due to an inadequate volume of blood received in culture bottles   Culture   Final    NO GROWTH 2 DAYS Performed at [facility], [address] [place], [place] [address]    Report Status PENDING  Incomplete  Culture, blood (Routine X 2) w Reflex to ID Panel     Status: None (Preliminary result)   Collection Time: 10/05/23  4:17 PM   Specimen: BLOOD  Result Value Ref Range Status   Specimen Description BLOOD BLOOD RIGHT HAND  Final   Special Requests   Final    BOTTLES DRAWN AEROBIC ONLY Blood  Culture results may not be optimal due to an inadequate volume of blood received in culture bottles   Culture   Final    NO GROWTH 2 DAYS Performed at [facility], [address], [place], [place] [address]    Report Status PENDING  Incomplete     Time coordinating discharge: 35 minutes  SIGNED:   Erick Blinks, DO [facility] 10/07/2023, 4:05 PM  If 7PM-7AM, please contact night-coverage [email or web address]

## 2023-10-07 NOTE — Progress Notes (Addendum)
Gastroenterology Progress Note   Referring Provider: No ref. provider found Primary Care Physician:  Mitchell Sams, DO Primary Gastroenterologist:  Mitchell Duos. Marletta Lor, DO Patient ID: Mitchell Oliver; [id number]; February 08, 1946   Subjective:    Appetite remains poor. No n/v. Abdominal pain resolved. Notes his diarrhea had essentially resolved prior to admission and actually became constipated in setting of imodium use. Since admission having some loose stools. No melena, brbpr.   Objective:   Vital signs in last 24 hours: Temp:  [98.1 F (36.7 C)-98.6 F (37 C)] 98.1 F (36.7 C) (12/12 0510) Pulse Rate:  [53-68] 60 (12/12 0510) Resp:  [19-21] 19 (12/11 2207) BP: (130-154)/(60-88) 130/60 (12/12 0510) SpO2:  [92 %-94 %] 94 % (12/12 0510) Last BM Date : 10/05/23 General:   Alert,  Well-developed, well-nourished, pleasant and cooperative in NAD. Hard of hearing. Girlfriend at bedside.  Head:  Normocephalic and atraumatic. Eyes:  Sclera clear, no icterus.   Abdomen:  Soft, nontender and nondistended.  Normal bowel sounds, without guarding, and without rebound.   Extremities:  Without clubbing, deformity or edema. Neurologic:  Alert and  oriented x4;  grossly normal neurologically. Skin:  Intact without significant lesions or rashes. Psych:  Alert and cooperative. Normal mood and affect.  Intake/Output from previous day: 12/11 0701 - 12/12 0700 In: 860 [P.O.:860] Out: 900 [Urine:900] Intake/Output this shift: No intake/output data recorded.  Lab Results: CBC Recent Labs    10/05/23 0459 10/06/23 0457 10/07/23 0429  WBC 25.3* 12.5* 8.8  HGB 14.0 12.9* 13.8  HCT 42.6 38.1* 39.4  MCV 93.6 91.6 88.9  PLT 131* 100* 96*   BMET Recent Labs    10/05/23 0459 10/06/23 0457 10/07/23 0429  NA 131* 129* 129*  K 4.8 3.6 3.5  CL 105 100 101  CO2 16* 18* 19*  GLUCOSE 69* 73 77  BUN 15 19 12   CREATININE 1.33* 1.19 0.86  CALCIUM 8.2* 8.1* 8.2*   LFTs Recent Labs     10/05/23 0459 10/05/23 0921 10/06/23 0457 10/07/23 0429  BILITOT 6.9* 3.0* 6.2* 6.1*  BILIDIR  --  1.9*  --   --   IBILI  --  1.1*  --   --   ALKPHOS 72  --  78 110  AST 98*  --  61* 70*  ALT 88*  --  66* 66*  PROT 5.3*  --  5.2* 5.4*  ALBUMIN 2.4*  --  2.2* 2.3*   Recent Labs    10/04/23 1127 10/06/23 0457  LIPASE 37 55*   PT/INR Recent Labs    10/04/23 1851 10/05/23 0921 10/06/23 0457  LABPROT 19.1* 30.9* 21.8*  INR 1.6* 2.9* 1.9*         Imaging Studies: DG CHEST PORT 1 VIEW Result Date: 10/05/2023 CLINICAL DATA:  Leukocytosis.  Abnormal chest x-ray. EXAM: PORTABLE CHEST 1 VIEW COMPARISON:  October 04, 2023. FINDINGS: Stable cardiomediastinal silhouette. Minimally increased reticular densities are noted throughout both lungs suggesting worsening edema or possibly atypical inflammation. Bony thorax is unremarkable. IMPRESSION: Minimally increased reticular densities are noted bilaterally as described above. Electronically Signed   By: Lupita Raider M.D.   On: 10/05/2023 13:40   MR ABDOMEN W WO CONTRAST Result Date: 10/04/2023 CLINICAL DATA:  Right upper quadrant abdominal pain, IPMN suspected EXAM: MRI ABDOMEN WITHOUT AND WITH CONTRAST TECHNIQUE: Multiplanar multisequence MR imaging of the abdomen was performed both before and after the administration of intravenous contrast. CONTRAST:  10mL GADAVIST GADOBUTROL 1 MMOL/ML IV  SOLN COMPARISON:  CT abdomen pelvis, 10/04/2023 FINDINGS: Lower chest: No acute abnormality.  Trace pleural effusions. Hepatobiliary: Coarse contour of the liver. No focal liver lesion. Small gallstones. No gallbladder wall thickening, or biliary dilatation. Pancreas: Tiny fluid signal cystic lesion within the pancreatic body measuring 0.4 cm (series 4, image 16). No solid component or suspicious contrast enhancement no pancreatic ductal dilatation or surrounding inflammatory changes. Spleen: Normal in size without significant abnormality.  Adrenals/Urinary Tract: Adrenal glands are unremarkable. Kidneys are normal, without renal calculi, solid lesion, or hydronephrosis. Stomach/Bowel: Stomach is within normal limits. No evidence of bowel wall thickening, distention, or inflammatory changes. Vascular/Lymphatic: Aortic atherosclerosis. Prominent portacaval lymph nodes, likely reactive in the setting of cirrhosis. Other: No abdominal wall hernia or abnormality. Small volume ascites. Musculoskeletal: No acute or significant osseous findings. IMPRESSION: 1. Coarse contour of the liver, suggestive of cirrhosis. No focal liver lesion. 2. Small volume ascites. 3. Cholelithiasis without evidence of acute cholecystitis. 4. Tiny fluid signal cystic lesion within the pancreatic body measuring 0.4 cm. No solid component or suspicious contrast enhancement. No pancreatic ductal dilatation or surrounding inflammatory changes. 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 Given tiny size and patient age, no specific further follow-up or characterization is required. 5. Trace pleural effusions. Aortic Atherosclerosis (ICD10-I70.0). Electronically Signed   By: Jearld Lesch M.D.   On: 10/04/2023 21:35   MR 3D Recon At Scanner Result Date: 10/04/2023 CLINICAL DATA:  Right upper quadrant abdominal pain, IPMN suspected EXAM: MRI ABDOMEN WITHOUT AND WITH CONTRAST TECHNIQUE: Multiplanar multisequence MR imaging of the abdomen was performed both before and after the administration of intravenous contrast. CONTRAST:  10mL GADAVIST GADOBUTROL 1 MMOL/ML IV SOLN COMPARISON:  CT abdomen pelvis, 10/04/2023 FINDINGS: Lower chest: No acute abnormality.  Trace pleural effusions. Hepatobiliary: Coarse contour of the liver. No focal liver lesion. Small gallstones. No gallbladder wall thickening, or biliary dilatation. Pancreas: Tiny fluid signal cystic lesion within the pancreatic body measuring 0.4 cm (series 4, image 16). No solid component or suspicious contrast  enhancement no pancreatic ductal dilatation or surrounding inflammatory changes. Spleen: Normal in size without significant abnormality. Adrenals/Urinary Tract: Adrenal glands are unremarkable. Kidneys are normal, without renal calculi, solid lesion, or hydronephrosis. Stomach/Bowel: Stomach is within normal limits. No evidence of bowel wall thickening, distention, or inflammatory changes. Vascular/Lymphatic: Aortic atherosclerosis. Prominent portacaval lymph nodes, likely reactive in the setting of cirrhosis. Other: No abdominal wall hernia or abnormality. Small volume ascites. Musculoskeletal: No acute or significant osseous findings. IMPRESSION: 1. Coarse contour of the liver, suggestive of cirrhosis. No focal liver lesion. 2. Small volume ascites. 3. Cholelithiasis without evidence of acute cholecystitis. 4. Tiny fluid signal cystic lesion within the pancreatic body measuring 0.4 cm. No solid component or suspicious contrast enhancement. No pancreatic ductal dilatation or surrounding inflammatory changes. 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 Given tiny size and patient age, no specific further follow-up or characterization is required. 5. Trace pleural effusions. Aortic Atherosclerosis (ICD10-I70.0). Electronically Signed   By: Jearld Lesch M.D.   On: 10/04/2023 21:35   CT ABDOMEN PELVIS W CONTRAST Result Date: 10/04/2023 CLINICAL DATA:  Acute onset abdominal pain radiating to the lower back EXAM: CT ABDOMEN AND PELVIS WITH CONTRAST TECHNIQUE: Multidetector CT imaging of the abdomen and pelvis was performed using the standard protocol following bolus administration of intravenous contrast. RADIATION DOSE REDUCTION: This exam was performed according to the departmental dose-optimization program which includes automated exposure control, adjustment of  the mA and/or kV according to patient size and/or use of iterative reconstruction technique. CONTRAST:  OMNIPAQUE IOHEXOL 300  MG/ML  SOLN COMPARISON:  CT abdomen and pelvis dated 11/21/2015 FINDINGS: Lower chest: No focal consolidation or pulmonary nodule in the lung bases. No pleural effusion or pneumothorax demonstrated. Partially imaged heart size is normal. Coronary artery calcifications. Hepatobiliary: Cirrhotic morphology. Subcentimeter segment 5/6 hypodensity (2:25), too small to characterize. Mild periportal edema. No intra or extrahepatic biliary ductal dilation. Cholelithiasis. Pancreas: 4 mm hypodensity in the pancreatic body (4:51), too small to characterize. No main pancreatic ductal dilation. Spleen: Normal in size without focal abnormality. Adrenals/Urinary Tract: No adrenal nodules. No suspicious renal mass, calculi or hydronephrosis. No focal bladder wall thickening. Stomach/Bowel: Minimally dilated fluid-filled esophagus. Normal appearance of the stomach. Mild circumferential mural thickening of the ascending colon. A few mildly dilated loops of right hemi abdominal small bowel with interspersed areas of mild circumferential mural thickening. Moderate volume stool in the rectum. Colonic diverticulosis without acute diverticulitis. Appendectomy. Vascular/Lymphatic: Aortic atherosclerosis. Upper abdominal varices. 14 mm periportal lymph node (2:26). Reproductive: Prostate is unremarkable. Other: Small volume ascites. No free air or fluid collection. A few scattered punctate calcifications along the inferior right hepatic lobe (2:34), unchanged. Musculoskeletal: No acute or abnormal lytic or blastic osseous lesions. Multilevel degenerative changes of the partially imaged thoracic and lumbar spine. Small fat-containing left inguinal hernia. IMPRESSION: 1. Mild circumferential mural thickening of the ascending colon and a few mildly dilated loops of right hemi abdominal small bowel with interspersed areas of mild circumferential mural thickening may represent infectious/inflammatory enterocolitis or portal enterocolopathy. 2.  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 3. Cholelithiasis. 4. 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 No main ductal dilation, mass lesion, or abnormal enhancement. Follow-up imaging in 2 years can be considered with contrast-enhanced MRI/MRCP if the patient is a surgical candidate. Alternatively, attention on follow-up. 5.  Aortic Atherosclerosis (ICD10-I70.0). Electronically Signed   By: Agustin Cree M.D.   On: 10/04/2023 14:56   DG Chest Port 1 View Result Date: 10/04/2023 CLINICAL DATA:  Chest pain EXAM: PORTABLE CHEST 1 VIEW COMPARISON:  X-ray 07/16/2015 FINDINGS: No consolidation, pneumothorax or effusion. No edema. Normal cardiopericardial silhouette. Increasing interstitial changes. Overlapping cardiac leads. Degenerative changes along the spine. IMPRESSION: Increasing interstitial changes of the lungs. Acute versus chronic. Please correlate with clinical presentation and recommend short follow-up. Electronically Signed   By: Karen Kays M.D.   On: 10/04/2023 12:20   ECHOCARDIOGRAM COMPLETE Result Date: 09/22/2023    ECHOCARDIOGRAM REPORT   Patient Name:   HANFORD TSUNG Date of Exam: 09/22/2023 Medical Rec #:  [id number]        Height:       74.0 in Accession #:    [id number]       Weight:       235.2 lb Date of Birth:  [birth year]         BSA:          2.329 m Patient Age:    77 years         BP:           134/80 mmHg Patient Gender: M                HR:           60 bpm. Exam Location:  [facility]  Procedure: 2D Echo, 3D Echo, Cardiac Doppler and Color Doppler Indications:    R06.09 Dyspnea  History:        Patient has prior history of Echocardiogram examinations, most                 recent 07/18/2015. CAD and Previous Myocardial Infarction, COPD                 and Stroke, Aortic Valve Disease, Signs/Symptoms:Dyspnea; Risk                  Factors:Former Smoker, Dyslipidemia, Hypertension and Family                 History of Coronary Artery Disease.  Sonographer:    Farrel Conners RDCS Referring Phys: Mitchell Oliver IMPRESSIONS  1. Left ventricular ejection fraction, by estimation, is 55 to 60%. The left ventricle has normal function. The left ventricle has no regional wall motion abnormalities. Left ventricular diastolic parameters were normal.  2. Right ventricular systolic function is normal. The right ventricular size is normal.  3. Left atrial size was moderately dilated.  4. Right atrial size was mildly dilated.  5. The mitral valve is abnormal. Mild mitral valve regurgitation. No evidence of mitral stenosis. Moderate mitral annular calcification.  6. Mean gradient 9.3 peak 17.5 mmHg DVI 0.54 and AVA 2.3 cm2 . The aortic valve is tricuspid. There is moderate calcification of the aortic valve. Aortic valve regurgitation is not visualized. Aortic valve sclerosis/calcification is present, without any  evidence of aortic stenosis.  7. Aortic dilatation noted. There is mild dilatation of the aortic root, measuring 39 mm.  8. The inferior vena cava is normal in size with greater than 50% respiratory variability, suggesting right atrial pressure of 3 mmHg. FINDINGS  Left Ventricle: Left ventricular ejection fraction, by estimation, is 55 to 60%. The left ventricle has normal function. The left ventricle has no regional wall motion abnormalities. The left ventricular internal cavity size was normal in size. There is  no left ventricular hypertrophy. Left ventricular diastolic parameters were normal. Right Ventricle: The right ventricular size is normal. No increase in right ventricular wall thickness. Right ventricular systolic function is normal. Left Atrium: Left atrial size was moderately dilated. Right Atrium: Right atrial size was mildly dilated. Pericardium: There is no evidence of pericardial effusion. Mitral Valve: The mitral valve is  abnormal. There is moderate thickening of the mitral valve leaflet(s). There is moderate calcification of the mitral valve leaflet(s). Moderate mitral annular calcification. Mild mitral valve regurgitation. No evidence of mitral valve stenosis. Tricuspid Valve: The tricuspid valve is normal in structure. Tricuspid valve regurgitation is mild . No evidence of tricuspid stenosis. Aortic Valve: Mean gradient 9.3 peak 17.5 mmHg DVI 0.54 and AVA 2.3 cm2. The aortic valve is tricuspid. There is moderate calcification of the aortic valve. Aortic valve regurgitation is not visualized. Aortic valve sclerosis/calcification is present, without any evidence of aortic stenosis. Aortic valve mean gradient measures 9.3 mmHg. Aortic valve peak gradient measures 17.5 mmHg. Aortic valve area, by VTI measures 2.46 cm. Pulmonic Valve: The pulmonic valve was normal in structure. Pulmonic valve regurgitation is mild. No evidence of pulmonic stenosis. Aorta: Aortic dilatation noted. There is mild dilatation of the aortic root, measuring 39 mm. Venous: The inferior vena cava is normal in size with greater than 50% respiratory variability, suggesting right atrial pressure of 3 mmHg. IAS/Shunts: No atrial level shunt detected by color flow Doppler.  LEFT VENTRICLE PLAX 2D  LVIDd:         5.20 cm   Diastology LVIDs:         3.10 cm   LV e' medial:    6.64 cm/s LV PW:         1.00 cm   LV E/e' medial:  15.2 LV IVS:        1.00 cm   LV e' lateral:   9.03 cm/s LVOT diam:     2.40 cm   LV E/e' lateral: 11.2 LV SV:         126 LV SV Index:   54 LVOT Area:     4.52 cm                           3D Volume EF:                          3D EF:        61 %                          LV EDV:       162 ml                          LV ESV:       63 ml                          LV SV:        99 ml RIGHT VENTRICLE RV Basal diam:  4.30 cm RV Mid diam:    3.50 cm RV S prime:     13.20 cm/s TAPSE (M-mode): 2.2 cm RVSP:           31.3 mmHg LEFT ATRIUM               Index        RIGHT ATRIUM           Index LA diam:        4.90 cm  2.10 cm/m   RA Pressure: 3.00 mmHg LA Vol (A2C):   83.6 ml  35.90 ml/m  RA Area:     25.80 cm LA Vol (A4C):   117.0 ml 50.25 ml/m  RA Volume:   82.10 ml  35.26 ml/m LA Biplane Vol: 107.0 ml 45.95 ml/m  AORTIC VALVE AV Area (Vmax):    2.43 cm AV Area (Vmean):   2.31 cm AV Area (VTI):     2.46 cm AV Vmax:           209.33 cm/s AV Vmean:          142.333 cm/s AV VTI:            0.511 m AV Peak Grad:      17.5 mmHg AV Mean Grad:      9.3 mmHg LVOT Vmax:         112.50 cm/s LVOT Vmean:        72.600 cm/s LVOT VTI:          0.278 m LVOT/AV VTI ratio: 0.54  AORTA Ao Root diam: 3.90 cm Ao Asc diam:  3.60 cm MITRAL VALVE                TRICUSPID VALVE MV Area (PHT): cm  TR Peak grad:   28.3 mmHg MV Decel Time: 229 msec     TR Vmax:        266.00 cm/s MV E velocity: 101.00 cm/s  Estimated RAP:  3.00 mmHg MV A velocity: 85.00 cm/s   RVSP:           31.3 mmHg MV E/A ratio:  1.19                             SHUNTS                             Systemic VTI:  0.28 m                             Systemic Diam: 2.40 cm Charlton Haws MD Electronically signed by Charlton Haws MD Signature Date/Time: 09/22/2023/3:02:24 PM    Final   [2 weeks]  Assessment:   78 year old male  with a history of prior CVA on Plavix (last dose 10/03/23), GERD, HTN, NSTEMI, hypercholesterolemia, COPD, hard of hearing, last seen by our practice in late Oct 2024 to establish care due to nausea, diarrhea, abdominal pain and felt to have self-limiting gastroenteritis presentation. Now presenting to the ED with acutely worsening abdominal pain, new onset chest pain now resolved, nausea, and found to have acutely elevated LFTs and CT findings with cirrhosis, enterocolitis vs colopathy, and pancreatic lesion.    Acutely elevated LFTs/bilirubin:  LFTs previously normal in 07/2023, acutely elevated on admission with AST 194, ALT 120, total bilirubin 4.6.  AST/ALT continue to  trend downward, down to 70/66 respectively today.  Total bilirubin remains elevated at 6.1 with direct bilirubin of 3.7 today.  Alkaline phosphatase has remained normal. Considering patient had acute onset abdominal pain with acutely elevated LFTs that are now downtrending and patient reporting resolution of abdominal pain, suspect patient passed a gallstone.  MRI Monday showed gallstones but no biliary dilation.  While patient was also found to have a new diagnosis of cirrhosis this admission,less likely this is driving his elevated LFTs or abdominal pain, however, serologic workup is underway.  Acute hepatitis panel is negative.  No iron overload. ANA positive but awaiting reflex. Several other serologies still pending.    Abdominal pain:  Acute onset Monday. Query transient choledocholithiasis, now resolved considering acutely elevated LFTs and bilirubin which are down trending. Notably, he may have been experiencing biliary colic previously as he has been dealing with intermittent upper abdominal pain for the last month. There was question of enterocolitis vs colopathy on CT. Patient with diarrhea for several weeks prior to admission, but constipated last week with use of imodium. Now with recurrent loose stools, ?antibiotic related.     At this point,  gallbladder/biliary etiology of his symptoms is most likely. General surgery has seen patient and does not feel that urgent cholecystectomy is warranted, though may need to consider cholecystectomy at some point in the future, especially if symptoms return. He will need EGD in the outpatient setting due to cirrhosis and to evaluate his prior UGI symptoms which also include decreased appetite/early satiety and unintentional weight loss over the last year. Also recommend colonoscopy to follow-up on CT findings.    Very limited amount of ascites on imaging, clinically abdomen nondistended and soft. Doubt source of his infection or pain.  Leukocytosis/lactic  acidosis meeting  severe sepsis criteria with AKI and hypoxia: WBC peaked at 25.3, lactic acid 3.7, afebrile on admission though did spike a fever earlier in admission.  Associated AKI and hypoxia. on IV Avycaz for Enterobacterales in blood culture ID panel, ID on board. Lactic acid trended down to 3.1. AKI resolved.  UA negative.  Blood cultures from 12/10 with no growth.    Etiology likely multifactorial though ID panel positive for Enterobacterales which may be the driving force. Some concern for enterocolitis on CT versus portal colopathy. Did note an episode of diarrhea after antibiotic therapy was changed but no clinical signs of colitis prior to.  There is concern for respiratory illness in the setting of tachypnea and hypoxia.  Chest x-ray yesterday with possible worsening edema or atypical inflammation.  Afebrile for 24 hours.   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 Acute hepatitis panel is negative.  No iron overload.  Reported drinking max of 1-2 beers nightly and he has been advised to avoid all alcohol moving forward.  Clinically, he fairly well compensated with no HE, abdominal distention, lower extremity edema, or overt bleeding.  Imaging is noting small volume ascites. Holding off on diuretics for now in light of AKI. INR was elevated, up to 2.9, though down to 1.9 yesterday. MELD 3.0 29-->27 though driven by elevated INR and elevated bilirubin possible secondary to passage of gallstones as discussed above.  He will need outpatient EGD due to varices noted on CT.   IPMN:  Follow outpatient.       Plan:   Outpatient EGD due to cirrhosis (varices noted on imagine) and evaluate prior upper GI symptoms over the past year including decreased appetite/early satiety and unintentional weight loss. Outpatient colonoscopy to follow-up on abnormal colon CT findings. Complete alcohol avoidance. Continue to trend LFTs.  Follow liver serologies. 2 gram sodium  diet.  Follow IPMN (pancreatic lesions) as outpatient. Continue antibiotics per infectious diseases.     LOS: 3 days   Leanna Battles. Dixon Boos [facility] [phone] 12/12/20247:50 AM   Addendum: spoke with daughter, [name], updated her regarding her dad. Leanna Battles. Dixon Boos [facility] [phone] 12/12/20243:15 PM

## 2023-10-07 NOTE — Progress Notes (Signed)
Mobility Specialist Progress Note:    10/07/23 1015  Mobility  Activity Ambulated with assistance in hallway  Level of Assistance Minimal assist, patient does 75% or more  Assistive Device Front wheel walker  Distance Ambulated (ft) 140 ft  Range of Motion/Exercises Active;All extremities  Activity Response Tolerated well  Mobility Referral Yes  Mobility visit 1 Mobility  Mobility Specialist Start Time (ACUTE ONLY) 0955  Mobility Specialist Stop Time (ACUTE ONLY) 1015  Mobility Specialist Time Calculation (min) (ACUTE ONLY) 20 min   Pt received in chair, family in room. Agreeable to mobility, required MinA to stand and ambulate with RW. Tolerated well, pt fatigued at EOS. SpO2 93% on RA. Returned to chair, alarm on. Call bell in reach, all needs met.   Lawerance Bach Mobility Specialist Please contact via Special educational needs teacher or  Rehab office at [phone]

## 2023-10-07 NOTE — Progress Notes (Addendum)
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 Sensitive    CEFTAZIDIME <=1 SENSITIVE Sensitive    CEFTRIAXONE <=0.25 SENS... Sensitive    CIPROFLOXACIN <=0.25 SENS... Sensitive    GENTAMICIN <=1 SENSITIVE Sensitive    IMIPENEM 1 SENSITIVE Sensitive    PIP/TAZO <=4 SENSITI... Sensitive    TRIMETH/SULFA <=20 SENSIT... Sensitive   -Stop avycaz, as serratia is pan sensitive -Start levaqiun 750 mg PO bid +  metronidazole 500 mg PO bid to complete 2 weeks of abx EOT 12/22 for bacteremia 2/2 likely GI source. Spoke with daughter in regards to plan.  -ID will sign off

## 2023-10-07 NOTE — Progress Notes (Signed)
Mobility Specialist Progress Note:    10/07/23 1418  Mobility  Activity Ambulated with assistance in hallway  Level of Assistance Contact guard assist, steadying assist  Assistive Device Front wheel walker  Distance Ambulated (ft) 200 ft  Range of Motion/Exercises Active;All extremities  Activity Response Tolerated well  Mobility Referral Yes  Mobility visit 1 Mobility  Mobility Specialist Start Time (ACUTE ONLY) 1335  Mobility Specialist Stop Time (ACUTE ONLY) 1350  Mobility Specialist Time Calculation (min) (ACUTE ONLY) 15 min   Pt received in chair, daughter in room. Eager for mobility, required CGA to stand and ambulate with RW. Tolerated well, asx throughout. Returned to room, left pt supine. Bed alarm on, all needs met.   Lawerance Bach Mobility Specialist Please contact via Special educational needs teacher or  Rehab office at [phone]

## 2023-10-07 NOTE — Progress Notes (Signed)
  Subjective: No abdominal pain.  Objective: Vital signs in last 24 hours: Temp:  [98.1 F (36.7 C)-98.6 F (37 C)] 98.1 F (36.7 C) (12/12 0510) Pulse Rate:  [57-68] 60 (12/12 0852) Resp:  [19-21] 19 (12/11 2207) BP: (130-162)/(60-88) 162/82 (12/12 0852) SpO2:  [92 %-94 %] 94 % (12/12 0510) Last BM Date : 10/06/23  Intake/Output from previous day: 12/11 0701 - 12/12 0700 In: 860 [P.O.:860] Out: 900 [Urine:900] Intake/Output this shift: Total I/O In: -  Out: 500 [Urine:500]  General appearance: alert, cooperative, and no distress GI: soft, non-tender; bowel sounds normal; no masses,  no organomegaly  Lab Results:  Recent Labs    10/06/23 0457 10/07/23 0429  WBC 12.5* 8.8  HGB 12.9* 13.8  HCT 38.1* 39.4  PLT 100* 96*   BMET Recent Labs    10/06/23 0457 10/07/23 0429  NA 129* 129*  K 3.6 3.5  CL 100 101  CO2 18* 19*  GLUCOSE 73 77  BUN 19 12  CREATININE 1.19 0.86  CALCIUM 8.1* 8.2*   PT/INR Recent Labs    10/05/23 0921 10/06/23 0457  LABPROT 30.9* 21.8*  INR 2.9* 1.9*    Studies/Results: No results found.  Anti-infectives: Anti-infectives (From admission, onward)    Start     Dose/Rate Route Frequency Ordered Stop   10/07/23 1800  levofloxacin (LEVAQUIN) tablet 750 mg        750 mg Oral Daily-1800 10/07/23 1046     10/07/23 1145  metroNIDAZOLE (FLAGYL) tablet 500 mg        500 mg Oral Every 12 hours 10/07/23 1046     10/05/23 1800  ceftazidime-avibactam (AVYCAZ) 2.5 g in dextrose 5 % 50 mL IVPB        2.5 g 25 mL/hr over 2 Hours Intravenous Every 8 hours 10/05/23 1514 10/07/23 1300   10/04/23 2200  piperacillin-tazobactam (ZOSYN) IVPB 3.375 g  Status:  Discontinued        3.375 g 12.5 mL/hr over 240 Minutes Intravenous Every 8 hours 10/04/23 1536 10/05/23 1514   10/04/23 1815  azithromycin (ZITHROMAX) 500 mg in sodium chloride 0.9 % 250 mL IVPB  Status:  Discontinued        500 mg 250 mL/hr over 60 Minutes Intravenous Every 24 hours  10/04/23 1800 10/05/23 0756   10/04/23 1530  piperacillin-tazobactam (ZOSYN) IVPB 3.375 g        3.375 g 100 mL/hr over 30 Minutes Intravenous  Once 10/04/23 1527 10/04/23 1607       Assessment/Plan: Impression: Cholelithiasis with normalizing LFTs.  I do suspect he did pass a gallstone.  His cirrhosis may be a component of his transaminitis.  At this point, no need for acute surgical intervention at the present time. Plan: I can see the patient as an outpatient once he has undergone endoscopic workup of his cirrhosis.  Will follow peripherally with you.  LOS: 3 days    Franky Macho 10/07/2023

## 2023-10-07 NOTE — Plan of Care (Signed)

## 2023-10-07 NOTE — Progress Notes (Signed)
Reviewed MR Abd with and without contrast with radiology regarding common bile duct evaluation. Dr. Tasia Catchings recommended relook to see if CBD adequately evaluated on this study as it was not a MRCP. Per radiologist, CBD 3mm. No filling defect. Radiologist did not think MRCP would provide any added value at this time.   Leanna Battles. [facility] [phone] 12/12/202412:14 PM

## 2023-10-08 ENCOUNTER — Telehealth: Payer: Self-pay | Admitting: *Deleted

## 2023-10-08 NOTE — Transitions of Care (Post Inpatient/ED Visit) (Signed)
10/08/2023  Name: Mitchell Oliver MRN: [id number] DOB: [birth year]  Today's TOC FU Call Status: Today's TOC FU Call Status:: Successful TOC FU Call Completed TOC FU Call Complete Date: 10/08/23 Patient's Name and Date of Birth confirmed.  Transition Care Management Follow-up Telephone Call Date of Discharge: 10/07/23 [facility] Type of Discharge: Inpatient Admission Primary Inpatient Discharge Diagnosis:: Elevated liver enzymes How have you been since you were released from the hospital?: Better (pt states energy level is better, resting alot, ambulating well,has walker if needed, appetite fair, will discuss with GI on 12/19, bowels movements wnl) Any questions or concerns?: No  Items Reviewed: Did you receive and understand the discharge instructions provided?: Yes Medications obtained,verified, and reconciled?: Yes (Medications Reviewed) Any new allergies since your discharge?: No Dietary orders reviewed?: Yes Type of Diet Ordered:: heart heatlhy, low sodium, carb modified Do you have support at home?: Yes People in Home: alone Name of Support/Comfort Primary Source: daughter Alben Spittle  and girfriend Okey Regal Patient / daughter declined enrollment in 30 day program  Medications Reviewed Today: Medications Reviewed Today     Reviewed by Audrie Gallus, RN (Registered Nurse) on 10/08/23 at 1340  Med List Status: <None>   Medication Order Taking? Sig Documenting Provider Last Dose Status Informant  albuterol (VENTOLIN HFA) 108 (90 Base) MCG/ACT inhaler [id number] Yes INHALE 2 PUFFS INTO THE LUNGS EVERY 6 HOURS AS NEEDED FOR WHEEZING OR SHORTNESS OF BREATH  Patient taking differently: Inhale 2 puffs into the lungs daily.   [name], DO Taking Active Self, Child           Med Note Elesa Massed, Laqueta Carina Oct 04, 2023  7:49 PM) Pt needs a refill  amLODipine (NORVASC) 2.5 MG tablet [id number] Yes TAKE 1 TABLET(2.5 MG) BY MOUTH DAILY Nahser, Deloris Ping, MD  Taking Active Self, Child  Ascorbic Acid (VITAMIN C PO) [id number] Yes Take 4,000 mg by mouth daily. [provider] Taking Active Self, Child  benazepril (LOTENSIN) 20 MG tablet [id number] Yes TAKE 1 TABLET(20 MG) BY MOUTH DAILY  Patient taking differently: Take 20 mg by mouth every evening.   [name], DO Taking Active Self, Child  Cholecalciferol 25 MCG (1000 UT) tablet [id number] Yes Take 1,000 Units by mouth daily. [provider] Taking Active Self, Child  clopidogrel (PLAVIX) 75 MG tablet [id number] Yes TAKE 1 TABLET(75 MG) BY MOUTH DAILY [name], DO Taking Active Self, Child  Cyanocobalamin (VITAMIN B-12 CR) 1000 MCG TBCR [id number] Yes Take 1 tablet by mouth every evening. [provider] Taking Active Self, Child           Med Note Arvil Chaco Aug 31, 2016  1:56 PM)    dorzolamide-timolol (COSOPT) 22.3-6.8 MG/ML ophthalmic solution [id number] Yes Place 1 drop into both eyes 2 (two) times daily. [provider] Taking Active Self, Child  fluticasone (FLONASE) 50 MCG/ACT nasal spray [id number] Yes SHAKE LIQUID AND USE 1 SPRAY IN EACH NOSTRIL DAILY AS NEEDED FOR ALLERGIES OR RHINITIS [name], DO Taking Active Self, Child  Iron, Ferrous Sulfate, 325 (65 Fe) MG TABS [id number] Yes Take 325 mg by mouth every other day. [name], DO Taking Active Self, Child  JARDIANCE 10 MG TABS tablet [id number] Yes TAKE 1 TABLET(10 MG) BY MOUTH DAILY [name], DO Taking Active Self, Child  latanoprost (XALATAN) 0.005 % ophthalmic solution [id number] Yes Place 1 drop into both eyes at  bedtime. [provider] Taking Active Self, Child  levofloxacin (LEVAQUIN) 750 MG tablet [id number] Yes Take 1 tablet (750 mg total) by mouth daily at 6 PM for 12 days. Sherryll Burger, Pratik D, DO Taking Active   metoprolol succinate (TOPROL XL) 25 MG 24 hr tablet [id number] Yes Take 1 tablet (25 mg total) by mouth daily. Gaston Islam., NP Taking Active Self,  Child  metroNIDAZOLE (FLAGYL) 500 MG tablet [id number] Yes Take 1 tablet (500 mg total) by mouth every 12 (twelve) hours for 12 days. Sherryll Burger, Pratik D, DO Taking Active   ondansetron (ZOFRAN) 4 MG tablet [id number] Yes Take 1 tablet (4 mg total) by mouth every 6 (six) hours as needed for nausea. Sherryll Burger, Pratik D, DO Taking Active   ondansetron (ZOFRAN-ODT) 4 MG disintegrating tablet [id number] Yes Take 1 tablet (4 mg total) by mouth every 8 (eight) hours as needed for nausea or vomiting. Lanelle Bal, DO Taking Active Self, Child  pantoprazole (PROTONIX) 40 MG tablet [id number] Yes TAKE 1 TABLET(40 MG) BY MOUTH EVERY MORNING Lanelle Bal, DO Taking Active Self, Child  polyethylene glycol (MIRALAX / GLYCOLAX) 17 g packet [id number] Yes Take 17 g by mouth daily as needed for mild constipation. Sherryll Burger, Pratik D, DO Taking Active   potassium chloride SA (KLOR-CON M) 20 MEQ tablet [id number] Yes TAKE 1 TABLET(20 MEQ) BY MOUTH DAILY [name], DO Taking Active Self, Child  tiZANidine (ZANAFLEX) 2 MG tablet [id number] Yes Take 2 mg by mouth 2 (two) times daily as needed. [provider] Taking Active Self, Child           Med Note Elesa Massed, Laqueta Carina Oct 04, 2023  7:57 PM) Pt's daughter stated he is "funny" about muscle relaxers and pain pills, doesn't usually ever want to take any of them but informed her that he would be willing to take one right now due to his back hurting so badly.  Zinc Acetate 50 MG CAPS [id number] Yes Take 50 mg by mouth every evening. [provider] Taking Active Self, Child           Med Note Arvil Chaco Aug 31, 2016  1:57 PM)              Home Care and Equipment/Supplies: Were Home Health Services Ordered?: No Any new equipment or medical supplies ordered?: No  Functional Questionnaire: Do you need assistance with bathing/showering or dressing?: Yes (has shower seat) Do you need assistance with meal preparation?: No Do you need  assistance with eating?: No Do you have difficulty maintaining continence: No Do you need assistance with getting out of bed/getting out of a chair/moving?: No Do you have difficulty managing or taking your medications?: No  Follow up appointments reviewed: PCP Follow-up appointment confirmed?: Yes MD Provider Line [phone] Given: No Date of PCP follow-up appointment?: 11/12/23 (daughter states they will not make sooner appointment, pt will follow up with specialist next week) Follow-up Provider: Everlene Other DO  @920  am Specialist Hospital Follow-up appointment confirmed?: Yes Date of Specialist follow-up appointment?: 10/14/23 Follow-Up Specialty Provider:: GI  Earnest Bailey  @ 1030 am Do you need transportation to your follow-up appointment?: No Do you understand care options if your condition(s) worsen?: Yes-patient verbalized understanding  SDOH Interventions Today    Flowsheet Row Most Recent Value  SDOH Interventions   Food Insecurity Interventions Intervention Not Indicated  Housing Interventions Intervention Not Indicated  Transportation Interventions Intervention Not Indicated  Utilities Interventions Intervention Not Indicated       Irving Shows [facility], BSN RN Care Manager/ Transition of Care [place]/ [facility] [phone]

## 2023-10-10 LAB — CULTURE, BLOOD (ROUTINE X 2)
Culture: NO GROWTH
Culture: NO GROWTH

## 2023-10-11 ENCOUNTER — Telehealth: Payer: Self-pay | Admitting: Gastroenterology

## 2023-10-11 DIAGNOSIS — R197 Diarrhea, unspecified: Secondary | ICD-10-CM

## 2023-10-11 NOTE — Telephone Encounter (Signed)
Received call from daughter, Alben Spittle. She is worried about his appetite, jaundice. No abdominal pain. Having diarrhea since discharge.   I have placed orders for stool studies and labs. Daughter will pick up stool kit.

## 2023-10-14 ENCOUNTER — Encounter: Payer: Self-pay | Admitting: Internal Medicine

## 2023-10-14 ENCOUNTER — Ambulatory Visit: Payer: Medicare Other | Admitting: Internal Medicine

## 2023-10-14 VITALS — BP 146/78 | HR 58 | Temp 98.6°F | Ht 74.0 in | Wt 255.8 lb

## 2023-10-14 DIAGNOSIS — B179 Acute viral hepatitis, unspecified: Secondary | ICD-10-CM | POA: Diagnosis not present

## 2023-10-14 DIAGNOSIS — K8020 Calculus of gallbladder without cholecystitis without obstruction: Secondary | ICD-10-CM

## 2023-10-14 DIAGNOSIS — R7989 Other specified abnormal findings of blood chemistry: Secondary | ICD-10-CM

## 2023-10-14 DIAGNOSIS — R110 Nausea: Secondary | ICD-10-CM

## 2023-10-14 DIAGNOSIS — K759 Inflammatory liver disease, unspecified: Secondary | ICD-10-CM

## 2023-10-14 DIAGNOSIS — D72829 Elevated white blood cell count, unspecified: Secondary | ICD-10-CM

## 2023-10-14 DIAGNOSIS — K7460 Unspecified cirrhosis of liver: Secondary | ICD-10-CM

## 2023-10-14 DIAGNOSIS — D136 Benign neoplasm of pancreas: Secondary | ICD-10-CM

## 2023-10-14 DIAGNOSIS — K529 Noninfective gastroenteritis and colitis, unspecified: Secondary | ICD-10-CM

## 2023-10-14 DIAGNOSIS — E875 Hyperkalemia: Secondary | ICD-10-CM

## 2023-10-14 DIAGNOSIS — R188 Other ascites: Secondary | ICD-10-CM

## 2023-10-14 LAB — COMPREHENSIVE METABOLIC PANEL
ALT: 52 [IU]/L — ABNORMAL HIGH (ref 0–44)
AST: 58 [IU]/L — ABNORMAL HIGH (ref 0–40)
Albumin: 3 g/dL — ABNORMAL LOW (ref 3.8–4.8)
Alkaline Phosphatase: 200 [IU]/L — ABNORMAL HIGH (ref 44–121)
BUN/Creatinine Ratio: 7 — ABNORMAL LOW (ref 10–24)
BUN: 9 mg/dL (ref 8–27)
Bilirubin Total: 6.5 mg/dL — ABNORMAL HIGH (ref 0.0–1.2)
CO2: 19 mmol/L — ABNORMAL LOW (ref 20–29)
Calcium: 8.7 mg/dL (ref 8.6–10.2)
Chloride: 98 mmol/L (ref 96–106)
Creatinine, Ser: 1.25 mg/dL (ref 0.76–1.27)
Globulin, Total: 3 g/dL (ref 1.5–4.5)
Glucose: 125 mg/dL — ABNORMAL HIGH (ref 70–99)
Potassium: 5.3 mmol/L — ABNORMAL HIGH (ref 3.5–5.2)
Sodium: 131 mmol/L — ABNORMAL LOW (ref 134–144)
Total Protein: 6 g/dL (ref 6.0–8.5)
eGFR: 59 mL/min/{1.73_m2} — ABNORMAL LOW (ref >59)

## 2023-10-14 LAB — CBC WITH DIFFERENTIAL/PLATELET
Basophils Absolute: 0.1 10*3/uL (ref 0.0–0.2)
Basos: 1 %
EOS (ABSOLUTE): 0.3 10*3/uL (ref 0.0–0.4)
Eos: 2 %
Hematocrit: 45.8 % (ref 37.5–51.0)
Hemoglobin: 16 g/dL (ref 13.0–17.7)
Immature Grans (Abs): 0.3 10*3/uL — ABNORMAL HIGH (ref 0.0–0.1)
Immature Granulocytes: 2 %
Lymphocytes Absolute: 2 10*3/uL (ref 0.7–3.1)
Lymphs: 14 %
MCH: 31.1 pg (ref 26.6–33.0)
MCHC: 34.9 g/dL (ref 31.5–35.7)
MCV: 89 fL (ref 79–97)
Monocytes Absolute: 0.8 10*3/uL (ref 0.1–0.9)
Monocytes: 6 %
Neutrophils Absolute: 10.8 10*3/uL — ABNORMAL HIGH (ref 1.4–7.0)
Neutrophils: 75 %
Platelets: 193 10*3/uL (ref 150–450)
RBC: 5.14 x10E6/uL (ref 4.14–5.80)
RDW: 12.9 % (ref 11.6–15.4)
WBC: 14.3 10*3/uL — ABNORMAL HIGH (ref 3.4–10.8)

## 2023-10-14 LAB — PROTIME-INR
INR: 1.4 — ABNORMAL HIGH (ref 0.9–1.2)
Prothrombin Time: 15.4 s — ABNORMAL HIGH (ref 9.1–12.0)

## 2023-10-14 MED ORDER — FLUCONAZOLE 150 MG PO TABS: ORAL_TABLET | ORAL | 0 refills | Status: DC

## 2023-10-14 NOTE — Progress Notes (Signed)
Referring Provider: [name], DO Primary Care Physician:  [name], DO Primary GI:  Dr. Marletta Lor  Chief Complaint  Patient presents with   Follow-up    Follow up from hospital. Pt states he feels 100% better    HPI:   Mitchell Oliver is a 77 y.o. male who presents to clinic today for hospital follow-up visit.  Recently admitted to [facility] 10/04/2023 after presenting with acute abdominal pain, sepsis, AKI, abnormal LFTs.  Found to have Enterobacter bacteremia, source felt to be biliary in nature possibly passed microlithiasis versus gallstone.  See below for further details.  Acutely elevated LFTs/bilirubin:  LFTs previously normal in 07/2023, acutely elevated on admission with AST 194, ALT 120, total bilirubin 4.6.  AST/ALT continue to trend downward, down to 70/66 respectively on day of DC.  Total bilirubin remained elevated at 6.1 with direct bilirubin of 3.7.  MRI 10/04/23 showed gallstones but no biliary dilation.  Serological workup including acute hepatitis panel negative, iron studies normal, ceruloplasmin negative, ANA positive though IgG normal, ASMA normal, AMA normal.   Abdominal pain:  Acute prior to admission. Query transient choledocholithiasis, now resolved considering acutely elevated LFTs and bilirubin which are down trending. Notably, he may have been experiencing biliary colic previously as he has been dealing with intermittent upper abdominal pain for the last month. There was question of enterocolitis vs colopathy on CT. Patient with diarrhea for several weeks prior to admission.   Leukocytosis/lactic acidosis meeting severe sepsis criteria with AKI and hypoxia: WBC peaked at 25.3, lactic acid 3.7, afebrile on admission though did spike a fever during hospital stay.  Associated AKI and hypoxia. on IV Avycaz for Enterobacterales in blood culture ID panel, ID consulted. Lactic acid trended down to 3.1. AKI resolved.  UA negative.  Blood cultures from  12/10 with no growth.    Currently on course of p.o. levofloxacin.   Cirrhosis:  New diagnosis, likely secondary to [facility], serologic workup as above. Reported drinking max of 1-2 beers nightly and he has been advised to avoid all alcohol moving forward.  Clinically, he fairly well compensated with no HE, abdominal distention, lower extremity edema, or overt bleeding.  Imaging is noting small volume ascites.  INR was elevated, up to 2.9, though down to 1.9 yesterday. MELD 3.0 29-->27 though driven by elevated INR and elevated bilirubin possible secondary to passage of gallstones as discussed above.  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   IPMN:  Small, 4 mm, recommended surveillance imaging in 2 years.  Today, states he is doing much better.  Accompanied by his daughter [name].  Abdominal pain has resolved.  Diarrhea  improved.  Fatigue improving.  Does note intermittent nausea as well as poor appetite.  Blood work 10/13/2023 with sodium 131, potassium 5.3, alk phos 200, albumin 3.0, AST 58, ALT 52, T. bili 6.5, WBC 14.3.  Past Medical History:  Diagnosis Date   Allergy    COPD (chronic obstructive pulmonary disease) (HCC)    CVA (cerebral vascular accident) (HCC) 2012   denies residual on 07/22/2015   ED (erectile dysfunction)    Family history of adverse reaction to anesthesia    "think my mother had real bad headaches after anesthesia"   GERD (gastroesophageal reflux disease)    Hypercholesterolemia    Hypertension    IFG (impaired fasting glucose)    NSTEMI (non-ST elevated myocardial infarction) (HCC)    Pneumonia ~ 2005    Past  Surgical History:  Procedure Laterality Date   APPENDECTOMY  2009 [facility]   BACK SURGERY     BIOPSY  05/04/2014   Procedure: BIOPSY;  Surgeon: West Bali, MD;  [facility];  Service: Endoscopy;;   CARDIAC CATHETERIZATION N/A 07/18/2015   Procedure: Left Heart Cath and Coronary Angiography;  Surgeon: Lyn Records, MD;  Location: [facility]  INVASIVE CV LAB;  Service: Cardiovascular;  Laterality: N/A;   CARDIAC CATHETERIZATION N/A 07/18/2015   Procedure: Coronary Stent Intervention;  Surgeon: Lyn Records, MD;  Location: [facility] INVASIVE CV LAB;  Service: Cardiovascular;  Laterality: N/A;   CARDIAC CATHETERIZATION N/A 07/22/2015   Procedure: Coronary Stent Intervention;  Surgeon: Lyn Records, MD;  Location: [facility] INVASIVE CV LAB;  Service: Cardiovascular;  Laterality: N/A;   COLONOSCOPY  2009   COLONOSCOPY N/A 08/04/2019   Procedure: COLONOSCOPY;  Surgeon: West Bali, MD;  Location: [facility];  Service: Endoscopy;  Laterality: N/A;  12:30   ESOPHAGOGASTRODUODENOSCOPY N/A 05/04/2014   Procedure: ESOPHAGOGASTRODUODENOSCOPY (EGD);  Surgeon: West Bali, MD;  Location: [facility];  Service: Endoscopy;  Laterality: N/A;  9:15   POLYPECTOMY  08/04/2019   Procedure: POLYPECTOMY;  Surgeon: West Bali, MD;  Location[facility];  Service: Endoscopy;;  hepatic flexure, ascending colon, transverse colon, splenic flexure, descending colon    POSTERIOR LAMINECTOMY / DECOMPRESSION LUMBAR SPINE  06/03/2015   SAVORY DILATION N/A 05/04/2014   Procedure: SAVORY DILATION;  Surgeon: West Bali, MD;  Location: [facility];  Service: Endoscopy;  Laterality: N/A;    Current Outpatient Medications  Medication Sig Dispense Refill   albuterol (VENTOLIN HFA) 108 (90 Base) MCG/ACT inhaler INHALE 2 PUFFS INTO THE LUNGS EVERY 6 HOURS AS NEEDED FOR WHEEZING OR SHORTNESS OF BREATH (Patient taking differently: Inhale 2 puffs into the lungs daily.) 8.5 g 1   amLODipine (NORVASC) 2.5 MG tablet TAKE 1 TABLET(2.5 MG) BY MOUTH DAILY 90 tablet 3   Ascorbic Acid (VITAMIN C PO) Take 4,000 mg by mouth daily.     benazepril (LOTENSIN) 20 MG tablet TAKE 1 TABLET(20 MG) BY MOUTH DAILY (Patient taking differently: Take 20 mg by mouth every evening.) 30 tablet 2   Cholecalciferol 25 MCG (1000 UT) tablet Take 1,000 Units by mouth daily.     clopidogrel  (PLAVIX) 75 MG tablet TAKE 1 TABLET(75 MG) BY MOUTH DAILY 90 tablet 0   Cyanocobalamin (VITAMIN B-12 CR) 1000 MCG TBCR Take 1 tablet by mouth every evening.     dorzolamide-timolol (COSOPT) 22.3-6.8 MG/ML ophthalmic solution Place 1 drop into both eyes 2 (two) times daily.     fluticasone (FLONASE) 50 MCG/ACT nasal spray SHAKE LIQUID AND USE 1 SPRAY IN EACH NOSTRIL DAILY AS NEEDED FOR ALLERGIES OR RHINITIS 16 g 1   Iron, Ferrous Sulfate, 325 (65 Fe) MG TABS Take 325 mg by mouth every other day. 45 tablet 1   JARDIANCE 10 MG TABS tablet TAKE 1 TABLET(10 MG) BY MOUTH DAILY 90 tablet 1   latanoprost (XALATAN) 0.005 % ophthalmic solution Place 1 drop into both eyes at bedtime.     levofloxacin (LEVAQUIN) 750 MG tablet Take 1 tablet (750 mg total) by mouth daily at 6 PM for 12 days. 12 tablet 0   metoprolol succinate (TOPROL XL) 25 MG 24 hr tablet Take 1 tablet (25 mg total) by mouth daily. 90 tablet 3   metroNIDAZOLE (FLAGYL) 500 MG tablet Take 1 tablet (500 mg total) by mouth every 12 (  twelve) hours for 12 days. 24 tablet 0   ondansetron (ZOFRAN) 4 MG tablet Take 1 tablet (4 mg total) by mouth every 6 (six) hours as needed for nausea. 20 tablet 0   ondansetron (ZOFRAN-ODT) 4 MG disintegrating tablet Take 1 tablet (4 mg total) by mouth every 8 (eight) hours as needed for nausea or vomiting. 30 tablet 0   pantoprazole (PROTONIX) 40 MG tablet TAKE 1 TABLET(40 MG) BY MOUTH EVERY MORNING 90 tablet 3   polyethylene glycol (MIRALAX / GLYCOLAX) 17 g packet Take 17 g by mouth daily as needed for mild constipation. 14 each 0   potassium chloride SA (KLOR-CON M) 20 MEQ tablet TAKE 1 TABLET(20 MEQ) BY MOUTH DAILY 90 tablet 0   tiZANidine (ZANAFLEX) 2 MG tablet Take 2 mg by mouth 2 (two) times daily as needed.     Zinc Acetate 50 MG CAPS Take 50 mg by mouth every evening.     No current facility-administered medications for this visit.    Allergies as of 10/14/2023 - Review Complete 10/14/2023  Allergen  Reaction Noted   Lidocaine Hives 10/04/2023    Family History  Problem Relation Age of Onset   Hypertension Mother    Hypertension Father    Heart attack Father    Stroke Father    Colon cancer Neg Hx    Colon polyps Neg Hx     Social History   Socioeconomic History   Marital status: Divorced    Spouse name: Not on file   Number of children: 3   Years of education: Not on file   Highest education level: Not on file  Occupational History   Occupation: Sales  Tobacco Use   Smoking status: Former    Current packs/day: 0.00    Average packs/day: 2.0 packs/day for 24.2 years (48.3 ttl pk-yrs)    Types: Cigarettes    Start date: 1963    Quit date: 12/24/1985    Years since quitting: 37.8   Smokeless tobacco: Never  Vaping Use   Vaping status: Never Used  Substance and Sexual Activity   Alcohol use: Yes    Alcohol/week: 6.0 standard drinks of alcohol    Types: 2 Glasses of wine, 4 Cans of beer per week   Drug use: No   Sexual activity: Yes  Other Topics Concern   Not on file  Social History Narrative   3 daughters, 2 live locally, one lives in [place].   3 grandsons   Active in grandson's travel ball. Works part time for [employer].    Social Drivers of Corporate investment banker Strain: Low Risk  (11/13/2022)   Overall Financial Resource Strain (CARDIA)    Difficulty of Paying Living Expenses: Not hard at all  Food Insecurity: No Food Insecurity (10/08/2023)   Hunger Vital Sign    Worried About Running Out of Food in the Last Year: Never true    Ran Out of Food in the Last Year: Never true  Transportation Needs: No Transportation Needs (10/08/2023)   PRAPARE - Administrator, Civil Service (Medical): No    Lack of Transportation (Non-Medical): No  Physical Activity: Sufficiently Active (11/13/2022)   Exercise Vital Sign    Days of Exercise per Week: 5 days    Minutes of Exercise per Session: 60 min  Stress: No Stress Concern Present  (11/13/2022)   [employer] of Occupational Health - Occupational Stress Questionnaire    Feeling of Stress : Not at  all  Social Connections: Moderately Integrated (11/13/2022)   Social Connection and Isolation Panel [NHANES]    Frequency of Communication with Friends and Family: More than three times a week    Frequency of Social Gatherings with Friends and Family: Three times a week    Attends Religious Services: More than 4 times per year    Active Member of Clubs or Organizations: Yes    Attends Banker Meetings: More than 4 times per year    Marital Status: Divorced    Subjective: Review of Systems  Constitutional:  Negative for chills and fever.  HENT:  Negative for congestion and hearing loss.   Eyes:  Negative for blurred vision and double vision.  Respiratory:  Negative for cough and shortness of breath.   Cardiovascular:  Negative for chest pain and palpitations.  Gastrointestinal:  Negative for abdominal pain, blood in stool, constipation, diarrhea, heartburn, melena and vomiting.  Genitourinary:  Negative for dysuria and urgency.  Musculoskeletal:  Negative for joint pain and myalgias.  Skin:  Negative for itching and rash.  Neurological:  Negative for dizziness and headaches.  Psychiatric/Behavioral:  Negative for depression. The patient is not nervous/anxious.      Objective: BP (!) 151/76   Pulse (!) 58   Temp 98.6 F (37 C)   Ht 6\' 2"  (1.88 m)   Wt 255 lb 12.8 oz (116 kg)   BMI 32.84 kg/m  Physical Exam Constitutional:      Appearance: Normal appearance.  HENT:     Head: Normocephalic and atraumatic.  Eyes:     Extraocular Movements: Extraocular movements intact.     Conjunctiva/sclera: Conjunctivae normal.  Cardiovascular:     Rate and Rhythm: Normal rate and regular rhythm.  Pulmonary:     Effort: Pulmonary effort is normal.     Breath sounds: Normal breath sounds.  Abdominal:     General: Bowel sounds are normal.     Palpations:  Abdomen is soft.  Musculoskeletal:        General: Normal range of motion.     Cervical back: Normal range of motion and neck supple.  Skin:    General: Skin is warm.  Neurological:     General: No focal deficit present.     Mental Status: He is alert and oriented to person, place, and time.  Psychiatric:        Mood and Affect: Mood normal.        Behavior: Behavior normal.     Assessment/Plan:  1.  Acute hepatitis-aminotransferases improved, however alk phos elevated 200, T. bili remains elevated at 6.5.  His abdominal pain has resolved.  Serological workup including acute hepatitis panel negative, iron studies normal, ceruloplasmin negative, ANA positive though IgG normal, ASMA normal, AMA normal.  I would expect his bilirubin to have improved though still remains elevated.  Alk Phos also worse. Unclear etiology.  Felt to be biliary related during hospitalization with transient biliary obstruction/possible cholangitis.  Continue on levofloxacin for full course.  Possibly related to colitis, diarrhea. Patient to collect stool sample.   Recheck blood work Monday.  May need to consider liver biopsy to further evaluate.  No new medications prior to admission to hospital besides BCAA  Will likely need cholecystectomy in the future.   2.  Leukocytosis-white count of 14.3 from 8.8 on day of discharge.  No fevers.  Continue p.o. levofloxacin for Enterobacter bacteremia.  Discussed with PCP who agrees to trend labs.  If patient has evidence  of fever, abdominal pain, diarrhea he needs to go to the emergency room for evaluation.  3.  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  Avoid all alcohol going forward.  INR steadily improving, 1.4 yesterday.  MELD 3.0 23 largely driven by bilirubin level.  4.  Colitis-?  Colopathy in the setting of cirrhosis. Stool studies ordered, has had difficultly with collecting. Needs updated colonoscopy though we will hold off  for now.  Discuss on follow-up visit.   5.  IPMN-small, 4 mm, recommended surveillance imaging in 2 years.  6.  Hyperkalemia-patient told to hold his p.o. potassium.  Recheck Monday.  Follow-up in 3 to 4 weeks.   10/14/2023 10:45 AM   Disclaimer: This note was dictated with voice recognition software. Similar sounding words can inadvertently be transcribed and may not be corrected upon review.

## 2023-10-14 NOTE — Patient Instructions (Addendum)
I am going to recheck your blood work on Monday at [facility].  We will call with results.  I am going to reach out to hepatology to discuss your case further.  If they have any further recommendations, we will call you and let you know.  You may require liver biopsy in the future.  You will likely need to have your gallbladder removed at some point in the future as well.  We also need to perform upper endoscopy and colonoscopy though we can delay these procedures for now as they are not urgent.  I want you to stop your oral potassium for now as your potassium level is mildly elevated.  We will recheck this on Monday as well.  For your yeast infection, would recommend over-the-counter Lotrimin cream.  I did send in 2 doses of Diflucan to take if this does not help.  Follow-up with me in 3 to 4 weeks.  I hope you both have a fantastic Christmas.  Dr. Marletta Lor

## 2023-10-16 ENCOUNTER — Encounter: Payer: Self-pay | Admitting: Internal Medicine

## 2023-10-17 LAB — CLOSTRIDIUM DIFFICILE EIA: C difficile Toxins A+B, EIA: NEGATIVE

## 2023-10-18 ENCOUNTER — Ambulatory Visit: Payer: Medicare Other | Admitting: Family Medicine

## 2023-10-18 ENCOUNTER — Telehealth: Payer: Self-pay | Admitting: Family Medicine

## 2023-10-18 ENCOUNTER — Ambulatory Visit (INDEPENDENT_AMBULATORY_CARE_PROVIDER_SITE_OTHER): Payer: Medicare Other | Admitting: Family Medicine

## 2023-10-18 VITALS — BP 148/77 | HR 59 | Temp 97.7°F | Ht 74.0 in

## 2023-10-18 DIAGNOSIS — K529 Noninfective gastroenteritis and colitis, unspecified: Secondary | ICD-10-CM | POA: Diagnosis not present

## 2023-10-18 DIAGNOSIS — J3489 Other specified disorders of nose and nasal sinuses: Secondary | ICD-10-CM | POA: Diagnosis not present

## 2023-10-18 DIAGNOSIS — K7460 Unspecified cirrhosis of liver: Secondary | ICD-10-CM | POA: Diagnosis not present

## 2023-10-18 DIAGNOSIS — R188 Other ascites: Secondary | ICD-10-CM

## 2023-10-18 NOTE — Telephone Encounter (Signed)
Patient rescheduled for 10/18/23 at 4:30pm

## 2023-10-18 NOTE — Telephone Encounter (Signed)
Spoke with pt's daughter and pt no longer has diarrhea and is seeing pcp today

## 2023-10-18 NOTE — Telephone Encounter (Signed)
Copied from CRM [id number]. Topic: Clinical - Medical Advice >> Oct 18, 2023  1:36 PM Ivette P wrote: Reason for CRM: Patient called in to see why appointment was cancelled. Called CAL and appt was cancelled by mistake. Please call Patient back to override and schedule pt due to mistake. Call back at [phone] / [phone].

## 2023-10-18 NOTE — Patient Instructions (Signed)
I will call about lab results.  Follow up in 2 weeks.

## 2023-10-19 LAB — COMPREHENSIVE METABOLIC PANEL
ALT: 50 [IU]/L — ABNORMAL HIGH (ref 0–44)
AST: 70 [IU]/L — ABNORMAL HIGH (ref 0–40)
Albumin: 2.8 g/dL — ABNORMAL LOW (ref 3.8–4.8)
Alkaline Phosphatase: 146 [IU]/L — ABNORMAL HIGH (ref 44–121)
BUN/Creatinine Ratio: 8 — ABNORMAL LOW (ref 10–24)
BUN: 8 mg/dL (ref 8–27)
Bilirubin Total: 4.8 mg/dL — ABNORMAL HIGH (ref 0.0–1.2)
CO2: 20 mmol/L (ref 20–29)
Calcium: 8.7 mg/dL (ref 8.6–10.2)
Chloride: 102 mmol/L (ref 96–106)
Creatinine, Ser: 1.06 mg/dL (ref 0.76–1.27)
Globulin, Total: 3.4 g/dL (ref 1.5–4.5)
Glucose: 84 mg/dL (ref 70–99)
Potassium: 4.6 mmol/L (ref 3.5–5.2)
Sodium: 134 mmol/L (ref 134–144)
Total Protein: 6.2 g/dL (ref 6.0–8.5)
eGFR: 71 mL/min/{1.73_m2} (ref >59)

## 2023-10-19 LAB — CBC
Hematocrit: 43 % (ref 37.5–51.0)
Hemoglobin: 15.1 g/dL (ref 13.0–17.7)
MCH: 31.5 pg (ref 26.6–33.0)
MCHC: 35.1 g/dL (ref 31.5–35.7)
MCV: 90 fL (ref 79–97)
Platelets: 207 10*3/uL (ref 150–450)
RBC: 4.79 x10E6/uL (ref 4.14–5.80)
RDW: 13.5 % (ref 11.6–15.4)
WBC: 11.4 10*3/uL — ABNORMAL HIGH (ref 3.4–10.8)

## 2023-10-19 LAB — GI PROFILE, STOOL, PCR

## 2023-10-19 LAB — TSH+FREE T4
Free T4: 1.71 ng/dL (ref 0.82–1.77)
TSH: 2.49 u[IU]/mL (ref 0.450–4.500)

## 2023-10-19 LAB — PROTIME-INR
INR: 1.4 — ABNORMAL HIGH (ref 0.9–1.2)
Prothrombin Time: 15.8 s — ABNORMAL HIGH (ref 9.1–12.0)

## 2023-10-19 MED ORDER — IPRATROPIUM BROMIDE 0.06 % NA SOLN: 2.0000 | Freq: Four times a day (QID) | NASAL | 0 refills | Status: DC | PRN

## 2023-10-22 DIAGNOSIS — J3489 Other specified disorders of nose and nasal sinuses: Secondary | ICD-10-CM | POA: Insufficient documentation

## 2023-10-22 NOTE — Assessment & Plan Note (Signed)
Improving.  Patient to complete antibiotic therapy.

## 2023-10-22 NOTE — Progress Notes (Signed)
Subjective:  Patient ID: Mitchell Oliver, male    DOB: 1946-09-26  Age: 77 y.o. MRN: [id number]  CC:  Follow up   HPI:  77 year old male presents for follow-up.  Recent hospitalized from 12/9 to 12/12.  Presented with chest pain and abdominal pain.  Was found to have severe sepsis secondary to Enterobacter bacteremia.  Found to have liver cirrhosis.  Patient was placed on antibiotic therapy and subsequently improved and was discharged.  Has seen GI for follow-up.  He states that he feels like he is improving.  Dr. Marletta Lor concerned given elevated white count.  He recommended close follow-up and I arranged for him to be seen by me today.  He states that he is feeling better.  Appetite is improving.  Still having some diarrhea.  Unsure whether this is from his current colitis or secondary to antibiotic use.  Bili has trended down from 6.5-4.8.  Continues to have elevated LFTs and alk phos although alk phos is improved.  No significant abdominal pain at this time.  White count improved as well at 11.4.  Patient does note that he is experiencing significant rhinorrhea.  He would like something for this.  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   Erectile dysfunction 06/22/2013   History of CVA 2012 06/22/2013    Social Hx   Social History   Socioeconomic History   Marital status: Divorced    Spouse name: Not on file   Number of children: 3   Years of education: Not on file    Highest education level: Some college, no degree  Occupational History   Occupation: Airline pilot  Tobacco Use   Smoking status: Former    Current packs/day: 0.00    Average packs/day: 2.0 packs/day for 24.2 years (48.3 ttl pk-yrs)    Types: Cigarettes    Start date: 1963    Quit date: 12/24/1985    Years since quitting: 37.8   Smokeless tobacco: Never  Vaping Use   Vaping status: Never Used  Substance and Sexual Activity   Alcohol use: Yes    Alcohol/week: 6.0 standard drinks of alcohol    Types: 2 Glasses of wine, 4 Cans of beer per week   Drug use: No   Sexual activity: Yes  Other Topics Concern   Not on file  Social History Narrative   3 daughters, 2 live locally, one lives in [place].   3 grandsons   Active in grandson's travel ball. Works part time for [employer].    Social Drivers of Corporate investment banker Strain: Low Risk  (10/18/2023)   Overall Financial Resource Strain (CARDIA)    Difficulty of Paying Living Expenses: Not very hard  Food Insecurity: No Food Insecurity (10/18/2023)   Hunger Vital Sign    Worried About Running Out of Food in the Last Year: Never true    Ran Out of Food in the Last Year: Never true  Transportation Needs: No Transportation Needs (10/18/2023)   PRAPARE -  Administrator, Civil Service (Medical): No    Lack of Transportation (Non-Medical): No  Physical Activity: Inactive (10/18/2023)   Exercise Vital Sign    Days of Exercise per Week: 0 days    Minutes of Exercise per Session: 60 min  Stress: No Stress Concern Present (10/18/2023)   [employer] of Occupational Health - Occupational Stress Questionnaire    Feeling of Stress : Not at all  Social Connections: Moderately Integrated (10/18/2023)   Social Connection and Isolation Panel [NHANES]    Frequency of Communication with Friends and Family: More than three times a week    Frequency of Social Gatherings with Friends and Family: Twice a week    Attends  Religious Services: More than 4 times per year    Active Member of [employer] or Organizations: Yes    Attends Banker Meetings: 1 to 4 times per year    Marital Status: Divorced    Review of Systems Per HPI  Objective:  BP (!) 148/77   Pulse (!) 59   Temp 97.7 F (36.5 C)   Ht 6\' 2"  (1.88 m)   SpO2 96%   BMI 32.84 kg/m      10/18/2023    4:32 PM 10/14/2023   10:46 AM 10/14/2023   10:43 AM  BP/Weight  Systolic BP 148 146 151  Diastolic BP 77 78 76  Wt. (Lbs)   255.8  BMI   32.84 kg/m2    Physical Exam Constitutional:      General: He is not in acute distress. HENT:     Head: Normocephalic and atraumatic.  Cardiovascular:     Rate and Rhythm: Regular rhythm. Bradycardia present.  Pulmonary:     Effort: Pulmonary effort is normal.     Breath sounds: Normal breath sounds. No wheezing or rales.  Abdominal:     Palpations: Abdomen is soft.     Tenderness: There is no abdominal tenderness. There is no guarding or rebound.  Neurological:     Mental Status: He is alert.     Lab Results  Component Value Date   WBC 11.4 (H) 10/18/2023   HGB 15.1 10/18/2023   HCT 43.0 10/18/2023   PLT 207 10/18/2023   GLUCOSE 84 10/18/2023   CHOL 95 (L) 08/19/2022   TRIG 68 08/19/2022   HDL 40 08/19/2022   LDLCALC 40 08/19/2022   ALT 50 (H) 10/18/2023   AST 70 (H) 10/18/2023   NA 134 10/18/2023   K 4.6 10/18/2023   CL 102 10/18/2023   CREATININE 1.06 10/18/2023   BUN 8 10/18/2023   CO2 20 10/18/2023   TSH 2.490 10/18/2023   PSA 0.71 07/03/2014   INR 1.4 (H) 10/18/2023   HGBA1C 5.8 (H) 07/30/2023     Assessment & Plan:   Problem List Items Addressed This Visit       Digestive   Cirrhosis of liver with ascites (HCC)   Patient denies any significant alcohol use.  Likely secondary to [facility].  I feel that the patient will need liver biopsy.      Enterocolitis - Primary   Improving.  Patient to complete antibiotic therapy.        Other   Rhinorrhea    Atrovent as prescribed.       Meds ordered this encounter  Medications   ipratropium (ATROVENT) 0.06 % nasal spray    Sig: Place 2 sprays into both nostrils 4 (four) times daily as needed for rhinitis.  Dispense:  15 mL    Refill:  0    Follow-up:  Return in about 2 weeks (around 11/01/2023).  Everlene Other DO [facility]

## 2023-10-22 NOTE — Assessment & Plan Note (Signed)
Atrovent as prescribed.

## 2023-10-22 NOTE — Assessment & Plan Note (Signed)
Patient denies any significant alcohol use.  Likely secondary to [facility].  I feel that the patient will need liver biopsy.

## 2023-11-02 ENCOUNTER — Ambulatory Visit: Payer: Medicare Other | Admitting: Family Medicine

## 2023-11-02 VITALS — BP 129/69 | Ht 74.0 in | Wt 230.0 lb

## 2023-11-02 DIAGNOSIS — R188 Other ascites: Secondary | ICD-10-CM | POA: Diagnosis not present

## 2023-11-02 DIAGNOSIS — K7460 Unspecified cirrhosis of liver: Secondary | ICD-10-CM | POA: Diagnosis not present

## 2023-11-02 NOTE — Patient Instructions (Signed)
 Labs today.  Follow up TBD after labs return.

## 2023-11-03 ENCOUNTER — Telehealth: Payer: Self-pay

## 2023-11-03 ENCOUNTER — Encounter: Payer: Self-pay | Admitting: Internal Medicine

## 2023-11-03 ENCOUNTER — Encounter: Payer: Self-pay | Admitting: Family Medicine

## 2023-11-03 LAB — CMP14+EGFR
ALT: 59 [IU]/L — ABNORMAL HIGH (ref 0–44)
AST: 74 [IU]/L — ABNORMAL HIGH (ref 0–40)
Albumin: 3.1 g/dL — ABNORMAL LOW (ref 3.8–4.8)
Alkaline Phosphatase: 169 [IU]/L — ABNORMAL HIGH (ref 44–121)
BUN/Creatinine Ratio: 5 — ABNORMAL LOW (ref 10–24)
BUN: 5 mg/dL — ABNORMAL LOW (ref 8–27)
Bilirubin Total: 3.1 mg/dL — ABNORMAL HIGH (ref 0.0–1.2)
CO2: 23 mmol/L (ref 20–29)
Calcium: 8.7 mg/dL (ref 8.6–10.2)
Chloride: 100 mmol/L (ref 96–106)
Creatinine, Ser: 1.07 mg/dL (ref 0.76–1.27)
Globulin, Total: 3.7 g/dL (ref 1.5–4.5)
Glucose: 78 mg/dL (ref 70–99)
Potassium: 4.5 mmol/L (ref 3.5–5.2)
Sodium: 134 mmol/L (ref 134–144)
Total Protein: 6.8 g/dL (ref 6.0–8.5)
eGFR: 71 mL/min/{1.73_m2} (ref >59)

## 2023-11-03 LAB — CBC
Hematocrit: 45.7 % (ref 37.5–51.0)
Hemoglobin: 15.2 g/dL (ref 13.0–17.7)
MCH: 31 pg (ref 26.6–33.0)
MCHC: 33.3 g/dL (ref 31.5–35.7)
MCV: 93 fL (ref 79–97)
Platelets: 201 10*3/uL (ref 150–450)
RBC: 4.9 x10E6/uL (ref 4.14–5.80)
RDW: 14.2 % (ref 11.6–15.4)
WBC: 11.6 10*3/uL — ABNORMAL HIGH (ref 3.4–10.8)

## 2023-11-03 LAB — PROTIME-INR
INR: 1.3 — ABNORMAL HIGH (ref 0.9–1.2)
Prothrombin Time: 14.4 s — ABNORMAL HIGH (ref 9.1–12.0)

## 2023-11-03 NOTE — Telephone Encounter (Signed)
 Reason for CRM: The patient wanted to follow-up on the missed call he received today, no documentation shown. He assumed this was in regards to  lab results from yesterday. Results shown are abnormal.    Callback #: [phone]

## 2023-11-03 NOTE — Progress Notes (Signed)
 Subjective:  Patient ID: Mitchell Oliver, male    DOB: 12/28/1945  Age: 78 y.o. MRN: [id number]  CC:   Chief Complaint  Patient presents with   Follow-up    Enterocolitis    HPI:  78 year old male presents for follow-up.  Patient reports that he is feeling well.  He has continued to improve since his hospitalization.  No current abdominal pain.  No fever.  Good appetite.  No additional diarrhea.  Follows closely with GI as well.  Needs additional labs today to reevaluate.  May need liver biopsy given persistent LFTs and elevated bili.  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) 05/06/2020   COPD with emphysema (HCC) 07/23/2015   CAD S/P percutaneous coronary angioplasty 07/22/2015   Essential hypertension, benign 06/22/2013   Dyslipidemia 06/22/2013   Type 2 diabetes mellitus with other circulatory complications (HCC) 06/22/2013   Erectile dysfunction 06/22/2013   History of CVA 2012 06/22/2013    Social Hx   Social History   Socioeconomic History   Marital status: Divorced    Spouse name: Not on file   Number of children: 3   Years of education: Not on file   Highest education level: Some college, no degree  Occupational History   Occupation: Airline Pilot  Tobacco Use   Smoking status: Former    Current packs/day: 0.00    Average packs/day: 2.0 packs/day for 24.2 years (48.3 ttl pk-yrs)    Types: Cigarettes    Start date: 1963    Quit date: 12/24/1985    Years since quitting: 37.8   Smokeless tobacco: Never  Vaping Use   Vaping status: Never Used  Substance and Sexual Activity   Alcohol use: Yes    Alcohol/week: 6.0 standard drinks of alcohol    Types: 2 Glasses of wine, 4 Cans of beer per week   Drug use: No    Sexual activity: Yes  Other Topics Concern   Not on file  Social History Narrative   3 daughters, 2 live locally, one lives in [place].   3 grandsons   Active in grandson's travel ball. Works part time for [employer].    Social Drivers of Corporate Investment Banker Strain: Low Risk  (10/18/2023)   Overall Financial Resource Strain (CARDIA)    Difficulty of Paying Living Expenses: Not very hard  Food Insecurity: No Food Insecurity (10/18/2023)   Hunger Vital Sign    Worried About Running Out of Food in the Last Year: Never true    Ran Out of Food in the Last Year: Never true  Transportation Needs: No Transportation Needs (10/18/2023)   PRAPARE - Administrator, Civil Service (Medical): No    Lack of Transportation (Non-Medical): No  Physical Activity: Inactive (10/18/2023)   Exercise Vital Sign    Days of Exercise per Week: 0 days    Minutes of Exercise per Session: 60 min  Stress: No Stress Concern Present (10/18/2023)   [employer] of Occupational Health - Occupational Stress Questionnaire    Feeling of Stress : Not at all  Social Connections: Moderately Integrated (10/18/2023)   Social Connection and Isolation Panel [NHANES]    Frequency of Communication with Friends and Family: More  than three times a week    Frequency of Social Gatherings with Friends and Family: Twice a week    Attends Religious Services: More than 4 times per year    Active Member of [employer] or Organizations: Yes    Attends Banker Meetings: 1 to 4 times per year    Marital Status: Divorced    Review of Systems Per HPI  Objective:  BP 129/69   Ht 6' 2 (1.88 m)   Wt 230 lb (104.3 kg)   BMI 29.53 kg/m      11/02/2023    2:55 PM 10/18/2023    4:32 PM 10/14/2023   10:46 AM  BP/Weight  Systolic BP 129 148 146  Diastolic BP 69 77 78  Wt. (Lbs) 230    BMI 29.53 kg/m2      Physical Exam Vitals and nursing note reviewed.  Constitutional:      General:  He is not in acute distress.    Appearance: Normal appearance.  HENT:     Head: Normocephalic and atraumatic.  Cardiovascular:     Rate and Rhythm: Normal rate and regular rhythm.  Pulmonary:     Effort: Pulmonary effort is normal.     Breath sounds: Normal breath sounds. No wheezing or rales.  Abdominal:     Palpations: Abdomen is soft.     Comments: Protuberant abdomen.  Nontender.  Neurological:     Mental Status: He is alert.     Lab Results  Component Value Date   WBC 11.6 (H) 11/02/2023   HGB 15.2 11/02/2023   HCT 45.7 11/02/2023   PLT 201 11/02/2023   GLUCOSE 78 11/02/2023   CHOL 95 (L) 08/19/2022   TRIG 68 08/19/2022   HDL 40 08/19/2022   LDLCALC 40 08/19/2022   ALT 59 (H) 11/02/2023   AST 74 (H) 11/02/2023   NA 134 11/02/2023   K 4.5 11/02/2023   CL 100 11/02/2023   CREATININE 1.07 11/02/2023   BUN 5 (L) 11/02/2023   CO2 23 11/02/2023   TSH 2.490 10/18/2023   PSA 0.71 07/03/2014   INR 1.3 (H) 11/02/2023   HGBA1C 5.8 (H) 07/30/2023     Assessment & Plan:   Problem List Items Addressed This Visit       Digestive   Cirrhosis of liver with ascites (HCC) - Primary   Repeat labs obtained.  LFTs continue to be mildly elevated.  Alk phos elevated.  Bili down to 3.1.  I am reaching out to GI regarding follow-up/further recommendations.      Relevant Orders   CBC (Completed)   CMP14+EGFR (Completed)   Protime-INR (Completed)   Follow-up:  3 months  Melinna Linarez Bluford DO [facility]

## 2023-11-03 NOTE — Assessment & Plan Note (Signed)
 Repeat labs obtained.  LFTs continue to be mildly elevated.  Alk phos elevated.  Bili down to 3.1.  I am reaching out to GI regarding follow-up/further recommendations.

## 2023-11-04 ENCOUNTER — Telehealth: Payer: Self-pay

## 2023-11-04 NOTE — Telephone Encounter (Signed)
 Reason for CRM: patient daughter [name] foster   [phone] missed call  fr dr Adriana Simas about next steps for patient. Patient is aware of abnormal  labs   Please advise

## 2023-11-09 ENCOUNTER — Other Ambulatory Visit: Payer: Self-pay | Admitting: Family Medicine

## 2023-11-09 ENCOUNTER — Telehealth: Payer: Self-pay

## 2023-11-09 NOTE — Telephone Encounter (Signed)
 Pt had sent several messages to you in MyChart (which I knew nothing about) but Dr Adriana Simas has addressed some things but the pt will be here Thursday to discuss having a liver biopsy and bloodwork if needed.

## 2023-11-11 ENCOUNTER — Encounter: Payer: Self-pay | Admitting: Internal Medicine

## 2023-11-11 ENCOUNTER — Ambulatory Visit: Payer: Medicare Other | Admitting: Internal Medicine

## 2023-11-11 VITALS — BP 162/82 | HR 75 | Temp 97.8°F | Ht 74.0 in | Wt 228.6 lb

## 2023-11-11 DIAGNOSIS — B179 Acute viral hepatitis, unspecified: Secondary | ICD-10-CM

## 2023-11-11 DIAGNOSIS — K8020 Calculus of gallbladder without cholecystitis without obstruction: Secondary | ICD-10-CM

## 2023-11-11 DIAGNOSIS — K7460 Unspecified cirrhosis of liver: Secondary | ICD-10-CM | POA: Diagnosis not present

## 2023-11-11 DIAGNOSIS — K759 Inflammatory liver disease, unspecified: Secondary | ICD-10-CM

## 2023-11-11 DIAGNOSIS — D136 Benign neoplasm of pancreas: Secondary | ICD-10-CM | POA: Diagnosis not present

## 2023-11-11 DIAGNOSIS — R188 Other ascites: Secondary | ICD-10-CM

## 2023-11-11 DIAGNOSIS — R7989 Other specified abnormal findings of blood chemistry: Secondary | ICD-10-CM

## 2023-11-11 NOTE — Progress Notes (Unsigned)
Referring Provider: [name], DO Primary Care Physician:  Mitchell Sams, DO Primary GI:  Dr. Marletta Oliver  Chief Complaint  Patient presents with   Follow-up    Pt here to find out the next steps in his treatment    HPI:   Mitchell Oliver is a 78 y.o. male who presents to clinic today for hospital follow-up visit.  Admitted to [facility] 10/04/2023 after presenting with acute abdominal pain, sepsis, AKI, abnormal LFTs.  Found to have Enterobacter bacteremia, source felt to be biliary in nature possibly passed microlithiasis versus gallstone.  See below for further details.  Acutely elevated LFTs/bilirubin:  LFTs previously normal in 07/2023, acutely elevated on admission with AST 194, ALT 120, total bilirubin 4.6.  AST/ALT continue to trend downward, down to 70/66 respectively on day of DC.  Total bilirubin remained elevated at 6.1 with direct bilirubin of 3.7.  MRI 10/04/23 showed gallstones but no biliary dilation.  Serological workup including acute hepatitis panel negative, iron studies normal, ceruloplasmin negative, ANA positive though IgG normal, ASMA normal, AMA normal.   Abdominal pain:  Acute prior to admission. Query transient choledocholithiasis, now resolved considering acutely elevated LFTs and bilirubin which are down trending. Notably, he may have been experiencing biliary colic previously as he has been dealing with intermittent upper abdominal pain for the last month. There was question of enterocolitis vs colopathy on CT. Patient with diarrhea for several weeks prior to admission.   Leukocytosis/lactic acidosis meeting severe sepsis criteria with AKI and hypoxia: WBC peaked at 25.3, lactic acid 3.7, afebrile on admission though did spike a fever during hospital stay.  Associated AKI and hypoxia. on IV Avycaz for Enterobacterales in blood culture ID panel, ID consulted. Lactic acid trended down to 3.1. AKI resolved.  UA negative.  Blood cultures from 12/10 with no  growth.    Currently on course of p.o. levofloxacin.   Cirrhosis:  New diagnosis, likely secondary to [facility], serologic workup as above. Reported drinking max of 1-2 beers nightly and he has been advised to avoid all alcohol moving forward.  Clinically, he fairly well compensated with no HE, abdominal distention, lower extremity edema, or overt bleeding.  Imaging is noting small volume ascites.  INR was elevated, up to 2.9, though down to 1.9 yesterday. MELD 3.0 29-->27 though driven by elevated INR and elevated bilirubin possible secondary to passage of gallstones as discussed above.  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   IPMN:  Small, 4 mm, recommended surveillance imaging in 2 years.  Today, states he is doing much better.  Accompanied by his daughter Mitchell Oliver.  Abdominal pain has resolved.  Diarrhea  improved.  Fatigue improving.  Does note intermittent nausea as well as poor appetite.  Blood work 10/13/2023 with sodium 131, potassium 5.3, alk phos 200, albumin 3.0, AST 58, ALT 52, T. bili 6.5, WBC 14.3.  Past Medical History:  Diagnosis Date   Allergy    COPD (chronic obstructive pulmonary disease) (HCC)    CVA (cerebral vascular accident) (HCC) 2012   denies residual on 07/22/2015   ED (erectile dysfunction)    Family history of adverse reaction to anesthesia    "think my mother had real bad headaches after anesthesia"   GERD (gastroesophageal reflux disease)    Hypercholesterolemia    Hypertension    IFG (impaired fasting glucose)    NSTEMI (non-ST elevated myocardial infarction) (HCC)    Pneumonia ~ 2005    Past  Surgical History:  Procedure Laterality Date   APPENDECTOMY  2009 [facility]   BACK SURGERY     BIOPSY  05/04/2014   Procedure: BIOPSY;  Surgeon: West Bali, MD;  Location: [facility];  Service: Endoscopy;;   CARDIAC CATHETERIZATION N/A 07/18/2015   Procedure: Left Heart Cath and Coronary Angiography;  Surgeon: Lyn Records, MD;  Location: [facility] INVASIVE CV LAB;   Service: Cardiovascular;  Laterality: N/A;   CARDIAC CATHETERIZATION N/A 07/18/2015   Procedure: Coronary Stent Intervention;  Surgeon: Lyn Records, MD;  Location: [facility] INVASIVE CV LAB;  Service: Cardiovascular;  Laterality: N/A;   CARDIAC CATHETERIZATION N/A 07/22/2015   Procedure: Coronary Stent Intervention;  Surgeon: Lyn Records, MD;  Location: [facility] INVASIVE CV LAB;  Service: Cardiovascular;  Laterality: N/A;   COLONOSCOPY  2009   COLONOSCOPY N/A 08/04/2019   Procedure: COLONOSCOPY;  Surgeon: West Bali, MD;  Location: [facility];  Service: Endoscopy;  Laterality: N/A;  12:30   ESOPHAGOGASTRODUODENOSCOPY N/A 05/04/2014   Procedure: ESOPHAGOGASTRODUODENOSCOPY (EGD);  Surgeon: West Bali, MD;  Location: [facility];  Service: Endoscopy;  Laterality: N/A;  9:15   POLYPECTOMY  08/04/2019   Procedure: POLYPECTOMY;  Surgeon: West Bali, MD;  Location: [facility];  Service: Endoscopy;;  hepatic flexure, ascending colon, transverse colon, splenic flexure, descending colon    POSTERIOR LAMINECTOMY / DECOMPRESSION LUMBAR SPINE  06/03/2015   SAVORY DILATION N/A 05/04/2014   Procedure: SAVORY DILATION;  Surgeon: West Bali, MD;  Location: [facility];  Service: Endoscopy;  Laterality: N/A;    Current Outpatient Medications  Medication Sig Dispense Refill   albuterol (VENTOLIN HFA) 108 (90 Base) MCG/ACT inhaler INHALE 2 PUFFS INTO THE LUNGS EVERY 6 HOURS AS NEEDED FOR WHEEZING OR SHORTNESS OF BREATH (Patient taking differently: Inhale 2 puffs into the lungs daily.) 8.5 g 1   amLODipine (NORVASC) 2.5 MG tablet TAKE 1 TABLET(2.5 MG) BY MOUTH DAILY 90 tablet 3   Ascorbic Acid (VITAMIN C PO) Take 4,000 mg by mouth daily.     benazepril (LOTENSIN) 20 MG tablet TAKE 1 TABLET(20 MG) BY MOUTH DAILY 30 tablet 2   Cholecalciferol 25 MCG (1000 UT) tablet Take 1,000 Units by mouth daily.     clopidogrel (PLAVIX) 75 MG tablet TAKE 1 TABLET(75 MG) BY MOUTH DAILY 90 tablet 0   Cyanocobalamin  (VITAMIN B-12 CR) 1000 MCG TBCR Take 1 tablet by mouth every evening.     dorzolamide-timolol (COSOPT) 22.3-6.8 MG/ML ophthalmic solution Place 1 drop into both eyes 2 (two) times daily.     fluticasone (FLONASE) 50 MCG/ACT nasal spray SHAKE LIQUID AND USE 1 SPRAY IN EACH NOSTRIL DAILY AS NEEDED FOR ALLERGIES OR RHINITIS 16 g 1   ipratropium (ATROVENT) 0.06 % nasal spray Place 2 sprays into both nostrils 4 (four) times daily as needed for rhinitis. 15 mL 0   Iron, Ferrous Sulfate, 325 (65 Fe) MG TABS Take 325 mg by mouth every other day. 45 tablet 1   JARDIANCE 10 MG TABS tablet TAKE 1 TABLET(10 MG) BY MOUTH DAILY 90 tablet 1   latanoprost (XALATAN) 0.005 % ophthalmic solution Place 1 drop into both eyes at bedtime.     metoprolol succinate (TOPROL XL) 25 MG 24 hr tablet Take 1 tablet (25 mg total) by mouth daily. 90 tablet 3   ondansetron (ZOFRAN) 4 MG tablet Take 1 tablet (4 mg total) by mouth every 6 (six) hours as needed for nausea. 20 tablet 0  ondansetron (ZOFRAN-ODT) 4 MG disintegrating tablet Take 1 tablet (4 mg total) by mouth every 8 (eight) hours as needed for nausea or vomiting. 30 tablet 0   pantoprazole (PROTONIX) 40 MG tablet TAKE 1 TABLET(40 MG) BY MOUTH EVERY MORNING 90 tablet 3   polyethylene glycol (MIRALAX / GLYCOLAX) 17 g packet Take 17 g by mouth daily as needed for mild constipation. 14 each 0   Zinc Acetate 50 MG CAPS Take 50 mg by mouth every evening.     fluconazole (DIFLUCAN) 150 MG tablet Take 1 tablet orally. If symptoms not resolved, take second dose on day 3 (Patient not taking: Reported on 11/11/2023) 2 tablet 0   potassium chloride SA (KLOR-CON M) 20 MEQ tablet TAKE 1 TABLET(20 MEQ) BY MOUTH DAILY 90 tablet 0   tiZANidine (ZANAFLEX) 2 MG tablet Take 2 mg by mouth 2 (two) times daily as needed. (Patient not taking: Reported on 10/14/2023)     No current facility-administered medications for this visit.    Allergies as of 11/11/2023 - Review Complete 11/11/2023   Allergen Reaction Noted   Lidocaine Hives 10/04/2023    Family History  Problem Relation Age of Onset   Hypertension Mother    Hypertension Father    Heart attack Father    Stroke Father    Colon cancer Neg Hx    Colon polyps Neg Hx     Social History   Socioeconomic History   Marital status: Divorced    Spouse name: Not on file   Number of children: 3   Years of education: Not on file   Highest education level: Some college, no degree  Occupational History   Occupation: Airline pilot  Tobacco Use   Smoking status: Former    Current packs/day: 0.00    Average packs/day: 2.0 packs/day for 24.2 years (48.3 ttl pk-yrs)    Types: Cigarettes    Start date: 1963    Quit date: 12/24/1985    Years since quitting: 37.9   Smokeless tobacco: Never  Vaping Use   Vaping status: Never Used  Substance and Sexual Activity   Alcohol use: Yes    Alcohol/week: 6.0 standard drinks of alcohol    Types: 2 Glasses of wine, 4 Cans of beer per week   Drug use: No   Sexual activity: Yes  Other Topics Concern   Not on file  Social History Narrative   3 daughters, 2 live locally, one lives in [place].   3 grandsons   Active in grandson's travel ball. Works part time for [employer].    Social Drivers of Corporate investment banker Strain: Low Risk  (10/18/2023)   Overall Financial Resource Strain (CARDIA)    Difficulty of Paying Living Expenses: Not very hard  Food Insecurity: No Food Insecurity (10/18/2023)   Hunger Vital Sign    Worried About Running Out of Food in the Last Year: Never true    Ran Out of Food in the Last Year: Never true  Transportation Needs: No Transportation Needs (10/18/2023)   PRAPARE - Administrator, Civil Service (Medical): No    Lack of Transportation (Non-Medical): No  Physical Activity: Inactive (10/18/2023)   Exercise Vital Sign    Days of Exercise per Week: 0 days    Minutes of Exercise per Session: 60 min  Stress: No Stress Concern  Present (10/18/2023)   [employer] of Occupational Health - Occupational Stress Questionnaire    Feeling of Stress : Not at all  Social Connections: Moderately Integrated (10/18/2023)   Social Connection and Isolation Panel [NHANES]    Frequency of Communication with Friends and Family: More than three times a week    Frequency of Social Gatherings with Friends and Family: Twice a week    Attends Religious Services: More than 4 times per year    Active Member of [employer] or Organizations: Yes    Attends Banker Meetings: 1 to 4 times per year    Marital Status: Divorced    Subjective: Review of Systems  Constitutional:  Negative for chills and fever.  HENT:  Negative for congestion and hearing loss.   Eyes:  Negative for blurred vision and double vision.  Respiratory:  Negative for cough and shortness of breath.   Cardiovascular:  Negative for chest pain and palpitations.  Gastrointestinal:  Negative for abdominal pain, blood in stool, constipation, diarrhea, heartburn, melena and vomiting.  Genitourinary:  Negative for dysuria and urgency.  Musculoskeletal:  Negative for joint pain and myalgias.  Skin:  Negative for itching and rash.  Neurological:  Negative for dizziness and headaches.  Psychiatric/Behavioral:  Negative for depression. The patient is not nervous/anxious.      Objective: BP (!) 162/82   Pulse 75   Temp 97.8 F (36.6 C)   Ht 6\' 2"  (1.88 m)   Wt 228 lb 9.6 oz (103.7 kg)   BMI 29.35 kg/m  Physical Exam Constitutional:      Appearance: Normal appearance.  HENT:     Head: Normocephalic and atraumatic.  Eyes:     Extraocular Movements: Extraocular movements intact.     Conjunctiva/sclera: Conjunctivae normal.  Cardiovascular:     Rate and Rhythm: Normal rate and regular rhythm.  Pulmonary:     Effort: Pulmonary effort is normal.     Breath sounds: Normal breath sounds.  Abdominal:     General: Bowel sounds are normal.     Palpations:  Abdomen is soft.  Musculoskeletal:        General: Normal range of motion.     Cervical back: Normal range of motion and neck supple.  Skin:    General: Skin is warm.  Neurological:     General: No focal deficit present.     Mental Status: He is alert and oriented to person, place, and time.  Psychiatric:        Mood and Affect: Mood normal.        Behavior: Behavior normal.      Assessment/Plan:  1.  Acute hepatitis-aminotransferases improved, however alk phos elevated 200, T. bili remains elevated at 6.5.  His abdominal pain has resolved.  Serological workup including acute hepatitis panel negative, iron studies normal, ceruloplasmin negative, ANA positive though IgG normal, ASMA normal, AMA normal.  I would expect his bilirubin to have improved though still remains elevated.  Alk Phos also worse. Unclear etiology.  Felt to be biliary related during hospitalization with transient biliary obstruction/possible cholangitis.  Continue on levofloxacin for full course.  Possibly related to colitis, diarrhea. Patient to collect stool sample.   Recheck blood work Monday.  May need to consider liver biopsy to further evaluate.  No new medications prior to admission to hospital besides BCAA  Will likely need cholecystectomy in the future.   2.  Leukocytosis-white count of 14.3 from 8.8 on day of discharge.  No fevers.  Continue p.o. levofloxacin for Enterobacter bacteremia.  Discussed with PCP who agrees to trend labs.  If patient has evidence of fever, abdominal  pain, diarrhea he needs to go to the emergency room for evaluation.  3.  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  Avoid all alcohol going forward.  INR steadily improving, 1.4 yesterday.  MELD 3.0 23 largely driven by bilirubin level.  4.  Colitis-?  Colopathy in the setting of cirrhosis. Stool studies ordered, has had difficultly with collecting. Needs updated colonoscopy though we will hold  off for now.  Discuss on follow-up visit.   5.  IPMN-small, 4 mm, recommended surveillance imaging in 2 years.  6.  Hyperkalemia-patient told to hold his p.o. potassium.  Recheck Monday.  Follow-up in 3 to 4 weeks.   11/11/2023 4:33 PM   Disclaimer: This note was dictated with voice recognition software. Similar sounding words can inadvertently be transcribed and may not be corrected upon review.

## 2023-11-11 NOTE — Patient Instructions (Signed)
I am going to order liver biopsy today to further evaluate your hepatitis.  Am going to order blood work at [facility] next week.  We will call with results.  I am also going to discuss your case with hepatology again.  I will call you if they have any other insights.  Follow-up in 4 weeks.  It was very nice seeing both you again today.  Dr. Marletta Lor

## 2023-11-12 ENCOUNTER — Ambulatory Visit: Payer: Medicare Other | Admitting: Family Medicine

## 2023-11-12 ENCOUNTER — Other Ambulatory Visit: Payer: Self-pay | Admitting: *Deleted

## 2023-11-12 DIAGNOSIS — K759 Inflammatory liver disease, unspecified: Secondary | ICD-10-CM

## 2023-11-15 ENCOUNTER — Encounter: Payer: Self-pay | Admitting: *Deleted

## 2023-11-15 NOTE — Progress Notes (Unsigned)
[place], [place], DO  [place], [place] Okay to hold plavix.

## 2023-11-16 LAB — COMPREHENSIVE METABOLIC PANEL
ALT: 34 [IU]/L (ref 0–44)
AST: 48 [IU]/L — ABNORMAL HIGH (ref 0–40)
Albumin: 2.9 g/dL — ABNORMAL LOW (ref 3.8–4.8)
Alkaline Phosphatase: 117 [IU]/L (ref 44–121)
BUN/Creatinine Ratio: 6 — ABNORMAL LOW (ref 10–24)
BUN: 6 mg/dL — ABNORMAL LOW (ref 8–27)
Bilirubin Total: 3 mg/dL — ABNORMAL HIGH (ref 0.0–1.2)
CO2: 19 mmol/L — ABNORMAL LOW (ref 20–29)
Calcium: 8.6 mg/dL (ref 8.6–10.2)
Chloride: 102 mmol/L (ref 96–106)
Creatinine, Ser: 1.03 mg/dL (ref 0.76–1.27)
Globulin, Total: 4 g/dL (ref 1.5–4.5)
Glucose: 96 mg/dL (ref 70–99)
Potassium: 4.3 mmol/L (ref 3.5–5.2)
Sodium: 133 mmol/L — ABNORMAL LOW (ref 134–144)
Total Protein: 6.9 g/dL (ref 6.0–8.5)
eGFR: 74 mL/min/{1.73_m2} (ref >59)

## 2023-11-16 LAB — PROTIME-INR
INR: 1.4 — ABNORMAL HIGH (ref 0.9–1.2)
Prothrombin Time: 15.1 s — ABNORMAL HIGH (ref 9.1–12.0)

## 2023-11-16 NOTE — Progress Notes (Signed)
[place], [place], DO  [place], [place] Okay to hold plavix.       Previous Messages    ----- Message ----- From: Claudean Kinds Sent: 11/12/2023  12:10 PM EST To: [name], DO; Claudean Kinds Subject: request to hold blood thinner                  Good afternoon ,  Patient is scheduled for a Biopsy on 12/02/23 - Requesting approval to hold Plavix 5 days prior to procedure .      Claudean Kinds [facility]  Centralized Scheduling for Imaging Services System-Wide Scheduler III Phone : [phone]  Fax: [phone]

## 2023-11-19 ENCOUNTER — Telehealth: Payer: Self-pay

## 2023-11-19 NOTE — Telephone Encounter (Signed)
Pt's daughter has phoned X 2 wanting to speak with you regarding her father. Her direct number is  [phone]. I seen where she has been in contact with you in the MyChart. I am also texting you regarding this.

## 2023-11-22 ENCOUNTER — Encounter: Payer: Self-pay | Admitting: Internal Medicine

## 2023-11-23 ENCOUNTER — Other Ambulatory Visit: Payer: Self-pay | Admitting: *Deleted

## 2023-11-23 DIAGNOSIS — R188 Other ascites: Secondary | ICD-10-CM

## 2023-11-23 NOTE — Telephone Encounter (Signed)
FYI,  Pt's other daughter Jethro Bolus on vm regarding her wanting to speak with you because she doesn't think [name] (other daughter) knows what to do. Pt's daughter Herbert Seta is not on DPR.  I phoned [name] and she is fully aware of this and she is going to talk to her dad about this because the pt does not want [place] talking to [facility].

## 2023-11-23 NOTE — Telephone Encounter (Signed)
Spoke with daughter and aware para scheduled for tomorrow, arrve at 10:45am

## 2023-11-24 ENCOUNTER — Encounter (HOSPITAL_COMMUNITY): Payer: Self-pay

## 2023-11-24 ENCOUNTER — Ambulatory Visit (HOSPITAL_COMMUNITY)
Admission: RE | Admit: 2023-11-24 | Discharge: 2023-11-24 | Disposition: A | Discharge status: Home / Self Care | Payer: Medicare Other | Source: Ambulatory Visit | Attending: Internal Medicine | Admitting: Internal Medicine

## 2023-11-24 DIAGNOSIS — K7460 Unspecified cirrhosis of liver: Secondary | ICD-10-CM | POA: Insufficient documentation

## 2023-11-24 DIAGNOSIS — R188 Other ascites: Secondary | ICD-10-CM | POA: Diagnosis present

## 2023-11-24 DIAGNOSIS — R140 Abdominal distension (gaseous): Secondary | ICD-10-CM | POA: Diagnosis not present

## 2023-11-24 LAB — GRAM STAIN

## 2023-11-24 LAB — ALBUMIN, PLEURAL OR PERITONEAL FLUID: Albumin, Fluid: <1.5 g/dL

## 2023-11-24 LAB — BODY FLUID CELL COUNT WITH DIFFERENTIAL
Eos, Fluid: 0 %
Lymphs, Fluid: 39 %
Monocyte-Macrophage-Serous Fluid: 49 % — ABNORMAL LOW (ref 50–90)
Neutrophil Count, Fluid: 12 % (ref 0–25)
Total Nucleated Cell Count, Fluid: 480 uL (ref 0–1000)

## 2023-11-24 MED ORDER — LIDOCAINE HCL (PF) 2 % IJ SOLN
INTRAMUSCULAR | Status: AC
Start: 2023-11-24 — End: ?
  Filled 2023-11-24: qty 10

## 2023-11-24 MED ORDER — LIDOCAINE HCL (PF) 2 % IJ SOLN
10.0000 mL | Freq: Once | INTRAMUSCULAR | Status: AC
  Administered 2023-11-24: 10 mL

## 2023-11-24 NOTE — Progress Notes (Signed)
Patient tolerated left sided paracentesis procedure well today and 4 Liters of clear  yellow ascites removed with labs collected and sent for processing. Patient verbalized understanding of discharge instructions and left via wheelchair with family with no acute distress noted.

## 2023-11-25 ENCOUNTER — Encounter: Payer: Self-pay | Admitting: Cardiovascular Disease

## 2023-11-25 NOTE — Progress Notes (Signed)
  Cardiology Office Note:  .   Date:  11/26/2023  ID:  Mitchell Oliver, DOB 02-Apr-1946, MRN [id number] PCP: [name], DO  [facility] Providers Cardiologist:  Lesleigh Noe, MD (Inactive)    History of Present Illness: .    Oct. 30, 2024  Mitchell Oliver is a 78 y.o. male , previous patient of Dr. Linford Arnold is seen today for the first time    Hx of CAD, s/p  NSTEMI in 2016 with DES to the PDA and staged PCI to the diag, CVA, GERD, HTN, HLD , COPD   Doing fairly well  Has developed some dyspnea  over the past several weeks  Has DOE , while walking   Echocardiogram from 2016 reveals normal left ventricular systolic function.  He has grade 1 diastolic dysfunction.  He denies any chest discomfort.  He denies any angina.  Still working  [employer] buildings in [place]   Jan. 31, 2025 Mitchell Oliver is seen for follow up of his dyspnea. Hx of CAD with NSTEMi In 2016 with stenting to the PCA and staged PCI to the diag Hx of CVA , HTN , HLD , COPD  Has been found to have cirrhosis of the liver  Needs a biopsy performed next week.  Echocardiogram was essentially unremarkable.  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  Will get a CTA of his aorta in 1 year for further evaluation of this.    ROS:   Studies Reviewed: .            Risk Assessment/Calculations:             Physical Exam:    Physical Exam: Blood pressure 124/70, pulse (!) 54, height 6\' 2"  (1.88 m), weight 214 lb (97.1 kg), SpO2 97%.      GEN:  Well nourished, well developed in no acute distress HEENT: Normal NECK: No JVD; No carotid bruits LYMPHATICS: No lymphadenopathy CARDIAC: RRR , soft cresendo - desresendo murmur at LSB   RESPIRATORY:  Clear to auscultation without rales, wheezing or rhonchi  ABDOMEN: Soft, non-tender, non-distended MUSCULOSKELETAL:  No edema; No deformity  SKIN: Warm and dry NEUROLOGIC:  Alert and oriented x 3   ASSESSMENT AND PLAN: .   1.   Shortness of breath:     Breathing has improved.  Echocardiogram in November, 2024 reveals normal left ventricular systolic function with EF of 55 to 60%. He has very minimal aortic stenosis with a mean aortic valve gradient of 9.8 mmHg. Mild pulmonic insufficiency He has mild dilatation ( upper limits of normal  of the aortic root (3.9 cm )   2.  Liver cirrhosis:   He needs a liver biopsy next week.  He is at low risk for his upcoming liver biopsy.  He may hold his Plavix for 5 to 7 days prior to his biopsy.  3.  Systolic murmur: He has very mild aortic stenosis.    He will need to follow-up clinically on a regular basis.        Signed, Kristeen Miss, MD

## 2023-11-26 ENCOUNTER — Telehealth: Payer: Self-pay

## 2023-11-26 ENCOUNTER — Ambulatory Visit: Payer: Medicare Other | Attending: Cardiovascular Disease | Admitting: Cardiovascular Disease

## 2023-11-26 VITALS — BP 124/70 | HR 54 | Ht 74.0 in | Wt 214.0 lb

## 2023-11-26 DIAGNOSIS — I77819 Aortic ectasia, unspecified site: Secondary | ICD-10-CM

## 2023-11-26 DIAGNOSIS — I10 Essential (primary) hypertension: Secondary | ICD-10-CM

## 2023-11-26 DIAGNOSIS — R0600 Dyspnea, unspecified: Secondary | ICD-10-CM | POA: Diagnosis not present

## 2023-11-26 DIAGNOSIS — I25119 Atherosclerotic heart disease of native coronary artery with unspecified angina pectoris: Secondary | ICD-10-CM | POA: Diagnosis not present

## 2023-11-26 DIAGNOSIS — Z01812 Encounter for preprocedural laboratory examination: Secondary | ICD-10-CM

## 2023-11-26 LAB — PATHOLOGIST SMEAR REVIEW

## 2023-11-26 NOTE — Patient Instructions (Signed)
Testing/Procedures: Your provider has requested that you have a CTA of the aorta in 1 year. Someone will reach out to you to schedule this scan.   Follow-Up: At [facility], you and your health needs are our priority.  As part of our continuing mission to provide you with exceptional heart care, we have created designated Provider Care Teams.  These Care Teams include your primary Cardiologist (physician) and Advanced Practice Providers (APPs -  Physician Assistants and Nurse Practitioners) who all work together to provide you with the care you need, when you need it.  We recommend signing up for the patient portal called "MyChart".  Sign up information is provided on this After Visit Summary.  MyChart is used to connect with patients for Virtual Visits (Telemedicine).  Patients are able to view lab/test results, encounter notes, upcoming appointments, etc.  Non-urgent messages can be sent to your provider as well.   To learn more about what you can do with MyChart, go to [email or web address].    Your next appointment:   1 year(s)  Provider:   Leodis Sias, MD   Other Instructions   1st Floor: - Lobby - Registration  - Pharmacy  - Lab - Cafe  2nd Floor: - PV Lab - Diagnostic Testing (echo, CT, nuclear med)  3rd Floor: - Vacant  4th Floor: - TCTS (cardiothoracic surgery) - AFib Clinic - Structural Heart Clinic - Vascular Surgery  - Vascular Ultrasound  5th Floor: - HeartCare Cardiology (general and EP) - Clinical Pharmacy for coumadin, hypertension, lipid, weight-loss medications, and med management appointments    Valet parking services will be available as well.

## 2023-11-26 NOTE — Telephone Encounter (Signed)
LVM (per DPR) for pt to make sure he gets a BMET drawn at any [facility] prior to his Coronary CTA in one year. Pt was told to call back with any questions.

## 2023-11-26 NOTE — Telephone Encounter (Signed)
Spoke with patient. He is aware that he needs to get a BMET prior to his Coronary CTA in one year. Lab was ordered and released.

## 2023-11-27 ENCOUNTER — Encounter: Payer: Self-pay | Admitting: Internal Medicine

## 2023-11-29 LAB — CULTURE, BODY FLUID W GRAM STAIN -BOTTLE
Culture: NO GROWTH
Special Requests: ADEQUATE

## 2023-11-30 ENCOUNTER — Other Ambulatory Visit: Payer: Self-pay | Admitting: Family Medicine

## 2023-12-01 ENCOUNTER — Other Ambulatory Visit: Payer: Self-pay | Admitting: Internal Medicine

## 2023-12-01 ENCOUNTER — Other Ambulatory Visit: Payer: Self-pay | Admitting: *Deleted

## 2023-12-01 ENCOUNTER — Other Ambulatory Visit: Payer: Self-pay | Admitting: Radiology

## 2023-12-01 ENCOUNTER — Telehealth: Payer: Self-pay

## 2023-12-01 DIAGNOSIS — R7989 Other specified abnormal findings of blood chemistry: Secondary | ICD-10-CM

## 2023-12-01 DIAGNOSIS — K759 Inflammatory liver disease, unspecified: Secondary | ICD-10-CM

## 2023-12-01 DIAGNOSIS — R188 Other ascites: Secondary | ICD-10-CM

## 2023-12-01 DIAGNOSIS — K7460 Unspecified cirrhosis of liver: Secondary | ICD-10-CM

## 2023-12-01 MED ORDER — SPIRONOLACTONE 100 MG PO TABS: 100.0000 mg | ORAL_TABLET | Freq: Every day | ORAL | 11 refills | Status: DC

## 2023-12-01 MED ORDER — FUROSEMIDE 40 MG PO TABS: 40.0000 mg | ORAL_TABLET | Freq: Every day | ORAL | 11 refills | Status: DC

## 2023-12-01 NOTE — Telephone Encounter (Signed)
 Pt's daughter phoned concerned regarding her father (the pt) had a para done last week and he has already gained it back and more. She states her father just doesn't look well. She asks that you please give her a call regarding some other things she is concerned about

## 2023-12-01 NOTE — Telephone Encounter (Signed)
 Called and spoke with patient's daughter.  Reports her father has gained nearly 9 to 10 pounds since last paracentesis.  Complaining about dyspnea and abdominal distention.  Scheduled for liver biopsy tomorrow.  We we will try to schedule to have paracentesis done prior to biopsy.  Also need to start on diuretic therapy.  Will send a prescription for furosemide  40 mg daily, spironolactone  100 mg daily.  Patient to watch blood pressure at home and call if drops below 100/60.  Monitor daily weights.  Will need to check electrolytes and kidney function on Monday.  Labs ordered.

## 2023-12-02 ENCOUNTER — Other Ambulatory Visit: Payer: Self-pay | Admitting: Internal Medicine

## 2023-12-02 ENCOUNTER — Ambulatory Visit (HOSPITAL_COMMUNITY): Payer: Medicare Other

## 2023-12-02 ENCOUNTER — Ambulatory Visit (HOSPITAL_COMMUNITY)
Admission: RE | Admit: 2023-12-02 | Discharge: 2023-12-02 | Disposition: A | Discharge status: Home / Self Care | Payer: Medicare Other | Source: Ambulatory Visit | Attending: Internal Medicine | Admitting: Internal Medicine

## 2023-12-02 ENCOUNTER — Encounter (HOSPITAL_COMMUNITY): Payer: Self-pay

## 2023-12-02 ENCOUNTER — Ambulatory Visit (HOSPITAL_COMMUNITY)
Admission: RE | Admit: 2023-12-02 | Disposition: A | Discharge status: Home / Self Care | Payer: Medicare Other | Source: Ambulatory Visit | Attending: Internal Medicine | Admitting: Internal Medicine

## 2023-12-02 DIAGNOSIS — K7460 Unspecified cirrhosis of liver: Secondary | ICD-10-CM | POA: Diagnosis not present

## 2023-12-02 DIAGNOSIS — R188 Other ascites: Secondary | ICD-10-CM | POA: Insufficient documentation

## 2023-12-02 DIAGNOSIS — K759 Inflammatory liver disease, unspecified: Secondary | ICD-10-CM | POA: Insufficient documentation

## 2023-12-02 HISTORY — PX: IR TRANSCATHETER BX: IMG713

## 2023-12-02 HISTORY — PX: IR PARACENTESIS: IMG2679

## 2023-12-02 LAB — CBC
HCT: 43.7 % (ref 39.0–52.0)
Hemoglobin: 14.6 g/dL (ref 13.0–17.0)
MCH: 30.7 pg (ref 26.0–34.0)
MCHC: 33.4 g/dL (ref 30.0–36.0)
MCV: 91.8 fL (ref 80.0–100.0)
Platelets: 197 10*3/uL (ref 150–400)
RBC: 4.76 MIL/uL (ref 4.22–5.81)
RDW: 16.7 % — ABNORMAL HIGH (ref 11.5–15.5)
WBC: 10.2 10*3/uL (ref 4.0–10.5)
nRBC: 0 % (ref 0.0–0.2)

## 2023-12-02 LAB — BODY FLUID CELL COUNT WITH DIFFERENTIAL
Lymphs, Fluid: 49 %
Monocyte-Macrophage-Serous Fluid: 36 % — ABNORMAL LOW (ref 50–90)
Neutrophil Count, Fluid: 15 % (ref 0–25)
Total Nucleated Cell Count, Fluid: 275 uL (ref 0–1000)

## 2023-12-02 LAB — GRAM STAIN

## 2023-12-02 LAB — PROTIME-INR
INR: 1.6 — ABNORMAL HIGH (ref 0.8–1.2)
Prothrombin Time: 19.1 s — ABNORMAL HIGH (ref 11.4–15.2)

## 2023-12-02 MED ORDER — LIDOCAINE-EPINEPHRINE 1 %-1:100000 IJ SOLN
20.0000 mL | Freq: Once | INTRAMUSCULAR | Status: AC
  Administered 2023-12-02: 5 mL via INTRADERMAL

## 2023-12-02 MED ORDER — FENTANYL CITRATE (PF) 100 MCG/2ML IJ SOLN
INTRAMUSCULAR | Status: AC | PRN
  Administered 2023-12-02 (×2): 25 ug via INTRAVENOUS

## 2023-12-02 MED ORDER — IOHEXOL 300 MG/ML  SOLN
50.0000 mL | Freq: Once | INTRAMUSCULAR | Status: AC | PRN
  Administered 2023-12-02: 40 mL via INTRAVENOUS

## 2023-12-02 MED ORDER — ALBUMIN HUMAN 25 % IV SOLN
0.0000 g | Freq: Once | INTRAVENOUS | Status: DC
  Filled 2023-12-02 (×2): qty 400

## 2023-12-02 MED ORDER — MIDAZOLAM HCL 2 MG/2ML IJ SOLN
INTRAMUSCULAR | Status: AC | PRN
  Administered 2023-12-02: 1 mg via INTRAVENOUS
  Administered 2023-12-02: .5 mg via INTRAVENOUS

## 2023-12-02 MED ORDER — ALBUMIN HUMAN 25 % IV SOLN: 0.0000 g | Freq: Once | INTRAVENOUS | Status: DC

## 2023-12-02 MED ORDER — ALBUMIN HUMAN 25 % IV SOLN
INTRAVENOUS | Status: AC
  Filled 2023-12-02: qty 200

## 2023-12-02 MED ORDER — SODIUM CHLORIDE 0.9 % IV SOLN: INTRAVENOUS | Status: AC

## 2023-12-02 MED ORDER — MIDAZOLAM HCL 2 MG/2ML IJ SOLN
INTRAMUSCULAR | Status: AC
  Filled 2023-12-02: qty 2

## 2023-12-02 MED ORDER — FENTANYL CITRATE (PF) 100 MCG/2ML IJ SOLN
INTRAMUSCULAR | Status: AC
  Filled 2023-12-02: qty 2

## 2023-12-02 MED ORDER — LIDOCAINE-EPINEPHRINE 1 %-1:100000 IJ SOLN
INTRAMUSCULAR | Status: AC
  Filled 2023-12-02: qty 1

## 2023-12-02 NOTE — Telephone Encounter (Signed)
 noted

## 2023-12-02 NOTE — Sedation Documentation (Signed)
 IR Procedural Pressures  IVC: 5 Hepatic Vein: 8 Portal Vein Wedge: 18

## 2023-12-02 NOTE — Progress Notes (Signed)
 Patient walked to the bathroom without difficulties. Right groin level 0, clean, dry, and intact.

## 2023-12-02 NOTE — Sedation Documentation (Signed)
 Okay to proceed with lidocaine  intradermally per Dr. Jennefer as allergy is to patch. Per pt. And daughter, Mitchell Oliver, he received lidocaine  last week for paracentesis without any issue. Pt. And daughter stated being comfortable with lidocaine  administration today.

## 2023-12-02 NOTE — Procedures (Signed)
 Interventional Radiology Procedure Note  Procedure:  1) Ultrasound guided paracentesis 2) Transvenous liver biopsy  Findings: Please refer to procedural dictation for full description. 18 ga core from right lobe x3.  4.3 L ascites drained via RLQ.  Mean pressure measurements (mmHg): IVC = 5 Hepatic vein = 8 Wedged portal vein = 18 HVPVG = 10 Portosystemic gradient = 13  Complications: None immediate  Estimated Blood Loss: < 5 mL  Recommendations: 2 hours bedrest. Follow Pathology results.   Ester Sides, MD

## 2023-12-02 NOTE — Progress Notes (Signed)
 Chief Complaint:  Hepatitis - image guided transvenous liver biopsy  Cirrhosis/ascites - paracentesis   Referring Provider(s): Cindie Dunnings    Supervising Physician: Jennefer Rover  Patient Status: [facility] - Out-pt  History of Present Illness: Mitchell Oliver is a 78 y.o. male with a history of HTN, NSTEMI, GERD, and COPD.  Pt presented to the ED on 10/04/23 with acute abdominal pain and was found to have sepsis, AKI, and abnormal LFTs.  He was admitted and found to have cirrhosis likely secondary to [facility] and hepatitis.  He saw IR on 11/24/23 for paracentesis but has gained 9-10 lbs in ascites since then. Pt was referred to interventional radiology for image guided paracentesis and image guided transvenous liver biopsy.    Patient is Full Code  Past Medical History:  Diagnosis Date   Allergy    COPD (chronic obstructive pulmonary disease) (HCC)    CVA (cerebral vascular accident) (HCC) 2012   denies residual on 07/22/2015   ED (erectile dysfunction)    Family history of adverse reaction to anesthesia    think my mother had real bad headaches after anesthesia   GERD (gastroesophageal reflux disease)    Hypercholesterolemia    Hypertension    IFG (impaired fasting glucose)    NSTEMI (non-ST elevated myocardial infarction) (HCC)    Pneumonia ~ 2005    Past Surgical History:  Procedure Laterality Date   APPENDECTOMY  2009 [facility]   BACK SURGERY     BIOPSY  05/04/2014   Procedure: BIOPSY;  Surgeon: Margo LITTIE Haddock, MD;  Location: [facility] ENDO [facility];  Service: Endoscopy;;   CARDIAC CATHETERIZATION N/A 07/18/2015   Procedure: Left Heart Cath and Coronary Angiography;  Surgeon: Victory LELON Sharps, MD;  Location: [facility] INVASIVE CV LAB;  Service: Cardiovascular;  Laterality: N/A;   CARDIAC CATHETERIZATION N/A 07/18/2015   Procedure: Coronary Stent Intervention;  Surgeon: Victory LELON Sharps, MD;  Location: [facility] INVASIVE CV LAB;  Service: Cardiovascular;  Laterality: N/A;   CARDIAC CATHETERIZATION N/A  07/22/2015   Procedure: Coronary Stent Intervention;  Surgeon: Victory LELON Sharps, MD;  Location: [facility] INVASIVE CV LAB;  Service: Cardiovascular;  Laterality: N/A;   COLONOSCOPY  2009   COLONOSCOPY N/A 08/04/2019   Procedure: COLONOSCOPY;  Surgeon: Haddock Margo LITTIE, MD;  Location: [facility];  Service: Endoscopy;  Laterality: N/A;  12:30   ESOPHAGOGASTRODUODENOSCOPY N/A 05/04/2014   Procedure: ESOPHAGOGASTRODUODENOSCOPY (EGD);  Surgeon: Margo LITTIE Haddock, MD;  Location: [facility];  Service: Endoscopy;  Laterality: N/A;  9:15   POLYPECTOMY  08/04/2019   Procedure: POLYPECTOMY;  Surgeon: Haddock Margo LITTIE, MD;  Location: [facility];  Service: Endoscopy;;  hepatic flexure, ascending colon, transverse colon, splenic flexure, descending colon    POSTERIOR LAMINECTOMY / DECOMPRESSION LUMBAR SPINE  06/03/2015   SAVORY DILATION N/A 05/04/2014   Procedure: SAVORY DILATION;  Surgeon: Margo LITTIE Haddock, MD;  Location: [facility];  Service: Endoscopy;  Laterality: N/A;    Allergies: Lidocaine   Medications: Prior to Admission medications   Medication Sig Start Date End Date Taking? Authorizing Provider  albuterol  (VENTOLIN  HFA) 108 (90 Base) MCG/ACT inhaler INHALE 2 PUFFS INTO THE LUNGS EVERY 6 HOURS AS NEEDED FOR WHEEZING OR SHORTNESS OF BREATH Patient taking differently: Inhale 2 puffs into the lungs daily. 07/13/23   Cook, Jayce G, DO  amLODipine  (NORVASC ) 2.5 MG tablet TAKE 1 TABLET(2.5 MG) BY MOUTH DAILY 09/07/23   Nahser, Aleene PARAS, MD  Ascorbic Acid (VITAMIN C PO) Take 4,000 mg  by mouth daily.    [provider]  benazepril  (LOTENSIN ) 20 MG tablet TAKE 1 TABLET(20 MG) BY MOUTH DAILY 11/10/23   Cook, Jayce G, DO  Cholecalciferol 25 MCG (1000 UT) tablet Take 1,000 Units by mouth daily. Patient not taking: Reported on 11/26/2023    [provider]  clopidogrel  (PLAVIX ) 75 MG tablet TAKE 1 TABLET(75 MG) BY MOUTH DAILY 07/12/23   Cook, Jayce G, DO  Cyanocobalamin (VITAMIN B-12 CR) 1000 MCG TBCR  Take 1 tablet by mouth every evening. Patient not taking: Reported on 11/26/2023    [provider]  dorzolamide -timolol  (COSOPT ) 22.3-6.8 MG/ML ophthalmic solution Place 1 drop into both eyes 2 (two) times daily. 03/28/21   [provider]  fluconazole  (DIFLUCAN ) 150 MG tablet Take 1 tablet orally. If symptoms not resolved, take second dose on day 3 Patient not taking: Reported on 11/26/2023 10/14/23   Cindie Carlin POUR, DO  fluticasone  (FLONASE ) 50 MCG/ACT nasal spray SHAKE LIQUID AND USE 1 SPRAY IN EACH NOSTRIL DAILY AS NEEDED FOR ALLERGIES OR RHINITIS 04/23/23   Cook, Jayce G, DO  furosemide  (LASIX ) 40 MG tablet Take 1 tablet (40 mg total) by mouth daily. 12/01/23 11/30/24  Carver, Charles K, DO  ipratropium (ATROVENT ) 0.06 % nasal spray USE 2 SPRAYS IN EACH NOSTRIL FOUR TIMES DAILY AS NEEDED FOR RHINITIS 12/01/23   Cook, Jayce G, DO  Iron , Ferrous Sulfate , 325 (65 Fe) MG TABS Take 325 mg by mouth every other day. 08/25/22   Cook, Jayce G, DO  JARDIANCE  10 MG TABS tablet TAKE 1 TABLET(10 MG) BY MOUTH DAILY 11/19/22   Cook, Jayce G, DO  latanoprost  (XALATAN ) 0.005 % ophthalmic solution Place 1 drop into both eyes at bedtime. 08/21/23   [provider]  metoprolol  succinate (TOPROL  XL) 25 MG 24 hr tablet Take 1 tablet (25 mg total) by mouth daily. 07/23/22   Wyn Jackee VEAR Mickey., NP  ondansetron  (ZOFRAN ) 4 MG tablet Take 1 tablet (4 mg total) by mouth every 6 (six) hours as needed for nausea. 10/07/23   Maree, Pratik D, DO  ondansetron  (ZOFRAN -ODT) 4 MG disintegrating tablet Take 1 tablet (4 mg total) by mouth every 8 (eight) hours as needed for nausea or vomiting. 08/18/23   Cindie Carlin POUR, DO  pantoprazole  (PROTONIX ) 40 MG tablet TAKE 1 TABLET(40 MG) BY MOUTH EVERY MORNING 08/18/23   Carver, Charles K, DO  polyethylene glycol (MIRALAX  / GLYCOLAX ) 17 g packet Take 17 g by mouth daily as needed for mild constipation. 10/07/23   Maree, Pratik D, DO  spironolactone  (ALDACTONE ) 100 MG  tablet Take 1 tablet (100 mg total) by mouth daily. 12/01/23 11/30/24  Cindie Carlin POUR, DO  tiZANidine  (ZANAFLEX ) 2 MG tablet Take 2 mg by mouth 2 (two) times daily as needed. 09/20/23   [provider]  Zinc  Acetate 50 MG CAPS Take 50 mg by mouth every evening.    [provider]     Family History  Problem Relation Age of Onset   Hypertension Mother    Hypertension Father    Heart attack Father    Stroke Father    Colon cancer Neg Hx    Colon polyps Neg Hx     Social History   Socioeconomic History   Marital status: Divorced    Spouse name: Not on file   Number of children: 3   Years of education: Not on file   Highest education level: Some college, no degree  Occupational History  Occupation: Airline Pilot  Tobacco Use   Smoking status: Former    Current packs/day: 0.00    Average packs/day: 2.0 packs/day for 24.2 years (48.3 ttl pk-yrs)    Types: Cigarettes    Start date: 1963    Quit date: 12/24/1985    Years since quitting: 37.9   Smokeless tobacco: Never  Vaping Use   Vaping status: Never Used  Substance and Sexual Activity   Alcohol use: Yes    Alcohol/week: 6.0 standard drinks of alcohol    Types: 2 Glasses of wine, 4 Cans of beer per week   Drug use: No   Sexual activity: Yes  Other Topics Concern   Not on file  Social History Narrative   3 daughters, 2 live locally, one lives in [place].   3 grandsons   Active in grandson's travel ball. Works part time for [employer].    Social Drivers of Corporate Investment Banker Strain: Low Risk  (10/18/2023)   Overall Financial Resource Strain (CARDIA)    Difficulty of Paying Living Expenses: Not very hard  Food Insecurity: No Food Insecurity (10/18/2023)   Hunger Vital Sign    Worried About Running Out of Food in the Last Year: Never true    Ran Out of Food in the Last Year: Never true  Transportation Needs: No Transportation Needs (10/18/2023)   PRAPARE - Scientist, Research (physical Sciences) (Medical): No    Lack of Transportation (Non-Medical): No  Physical Activity: Unknown (10/18/2023)   Exercise Vital Sign    Days of Exercise per Week: 0 days    Minutes of Exercise per Session: Not on file  Recent Concern: Physical Activity - Inactive (10/18/2023)   Exercise Vital Sign    Days of Exercise per Week: 0 days    Minutes of Exercise per Session: 60 min  Stress: No Stress Concern Present (10/18/2023)   [employer] of Occupational Health - Occupational Stress Questionnaire    Feeling of Stress : Not at all  Social Connections: Moderately Integrated (10/18/2023)   Social Connection and Isolation Panel [NHANES]    Frequency of Communication with Friends and Family: More than three times a week    Frequency of Social Gatherings with Friends and Family: Twice a week    Attends Religious Services: More than 4 times per year    Active Member of [employer] or Organizations: Yes    Attends Banker Meetings: 1 to 4 times per year    Marital Status: Divorced     Review of Systems: A 12 point ROS discussed and pertinent positives are indicated in the HPI above.  All other systems are negative.  Review of Systems  Constitutional:  Negative for fatigue and fever.  HENT:  Negative for congestion and dental problem.   Respiratory:  Negative for cough, chest tightness, shortness of breath and wheezing.   Cardiovascular:  Negative for chest pain.  Gastrointestinal:  Positive for abdominal distention. Negative for diarrhea, nausea and vomiting.  Neurological:  Negative for light-headedness and headaches.  Psychiatric/Behavioral:  Negative for agitation, behavioral problems and confusion.     Vital Signs: There were no vitals taken for this visit.  Advance Care Plan: The advanced care place/surrogate decision maker was discussed at the time of visit and the patient did not wish to discuss or was not able to name a surrogate decision maker or provide an  advance care plan.  Physical Exam Constitutional:      Appearance:  Normal appearance.  HENT:     Head: Normocephalic and atraumatic.     Mouth/Throat:     Mouth: Mucous membranes are moist.  Cardiovascular:     Rate and Rhythm: Normal rate and regular rhythm.  Pulmonary:     Effort: Pulmonary effort is normal.     Breath sounds: Normal breath sounds.  Abdominal:     General: Bowel sounds are normal. There is distension.     Palpations: Abdomen is soft.  Musculoskeletal:        General: Normal range of motion.     Cervical back: Normal range of motion.  Skin:    General: Skin is warm and dry.  Neurological:     Mental Status: He is alert and oriented to person, place, and time.  Psychiatric:        Mood and Affect: Mood normal.        Behavior: Behavior normal.     Imaging: US  Paracentesis Result Date: 11/24/2023 INDICATION: Abdominal distension and ascites, cirrhosis EXAM: ULTRASOUND GUIDED DIAGNOSTIC AND THERAPEUTIC PARACENTESIS MEDICATIONS: 1% LIDOCAINE  LOCAL COMPLICATIONS: None immediate. PROCEDURE: Informed written consent was obtained from the patient after a discussion of the risks, benefits and alternatives to treatment. A timeout was performed prior to the initiation of the procedure. Initial ultrasound scanning demonstrates a large amount of ascites within the LEFT lower abdominal quadrant. The right lower abdomen was prepped and draped in the usual sterile fashion. 1% lidocaine  was used for local anesthesia. Following this, a 6 Fr Safe-T-Centesis catheter was introduced. An ultrasound image was saved for documentation purposes. The paracentesis was performed. The catheter was removed and a dressing was applied. The patient tolerated the procedure well without immediate post procedural complication. FINDINGS: A total of approximately 4 L of clear peritoneal fluid was removed. Samples were sent to the laboratory as requested by the clinical team. IMPRESSION: Successful  ultrasound-guided paracentesis yielding 4.0 liters of peritoneal fluid. Electronically Signed   By: CHRISTELLA.  Shick M.D.   On: 11/24/2023 12:35    Labs:  CBC: Recent Labs    10/07/23 0429 10/13/23 1110 10/18/23 0942 11/02/23 1527  WBC 8.8 14.3* 11.4* 11.6*  HGB 13.8 16.0 15.1 15.2  HCT 39.4 45.8 43.0 45.7  PLT 96* 193 207 201    COAGS: Recent Labs    10/13/23 1110 10/18/23 0942 11/02/23 1527 11/15/23 1121  INR 1.4* 1.4* 1.3* 1.4*    BMP: Recent Labs    10/04/23 1127 10/05/23 0459 10/06/23 0457 10/07/23 0429 10/13/23 1110 10/18/23 0942 11/02/23 1527 11/15/23 1121  NA 137 131* 129* 129* 131* 134 134 133*  K 4.5 4.8 3.6 3.5 5.3* 4.6 4.5 4.3  CL 106 105 100 101 98 102 100 102  CO2 18* 16* 18* 19* 19* 20 23 19*  GLUCOSE 95 69* 73 77 125* 84 78 96  BUN 9 15 19 12 9 8  5* 6*  CALCIUM  9.5 8.2* 8.1* 8.2* 8.7 8.7 8.7 8.6  CREATININE 1.49* 1.33* 1.19 0.86 1.25 1.06 1.07 1.03  GFRNONAA 47* 54* >60 >60  --   --   --   --     LIVER FUNCTION TESTS: Recent Labs    10/13/23 1110 10/18/23 0942 11/02/23 1527 11/15/23 1121  BILITOT 6.5* 4.8* 3.1* 3.0*  AST 58* 70* 74* 48*  ALT 52* 50* 59* 34  ALKPHOS 200* 146* 169* 117  PROT 6.0 6.2 6.8 6.9  ALBUMIN  3.0* 2.8* 3.1* 2.9*    TUMOR MARKERS: No results for input(s): AFPTM,  CEA, CA199, CHROMGRNA in the last 8760 hours.  Assessment and Plan:  Pt has cirrhosis and will undergo paracentesis on 12/02/23.    Pt has hepatitis and will undergo image guided transvenous liver biopsy on 12/02/23.    Risks and benefits of image guided transvenous liver biopsy and image guided paracentesis was discussed with the patient and/or patient's family including, but not limited to bleeding, infection, damage to adjacent structures or low yield requiring additional tests.  All of the questions were answered and there is agreement to proceed.  Consent signed and in chart.   Thank you for allowing our service to participate in JAMAS JAQUAY 's care.  Electronically Signed: Lavanda JAYSON Jurist, PA-C   12/02/2023, 8:24 AM    I spent a total of  30 Minutes   in face to face in clinical consultation, greater than 50% of which was counseling/coordinating care for image guided transvenous liver biopsy.

## 2023-12-03 ENCOUNTER — Encounter: Payer: Self-pay | Admitting: Internal Medicine

## 2023-12-03 LAB — PATHOLOGIST SMEAR REVIEW

## 2023-12-04 ENCOUNTER — Other Ambulatory Visit: Payer: Self-pay | Admitting: Family Medicine

## 2023-12-06 ENCOUNTER — Other Ambulatory Visit: Payer: Self-pay

## 2023-12-06 DIAGNOSIS — J3489 Other specified disorders of nose and nasal sinuses: Secondary | ICD-10-CM

## 2023-12-06 LAB — SURGICAL PATHOLOGY

## 2023-12-06 MED ORDER — IPRATROPIUM BROMIDE 0.06 % NA SOLN: 2.0000 | Freq: Four times a day (QID) | NASAL | 0 refills | Status: DC

## 2023-12-07 ENCOUNTER — Telehealth: Payer: Self-pay

## 2023-12-07 LAB — COMPREHENSIVE METABOLIC PANEL
ALT: 33 [IU]/L (ref 0–44)
AST: 55 [IU]/L — ABNORMAL HIGH (ref 0–40)
Albumin: 2.5 g/dL — ABNORMAL LOW (ref 3.8–4.8)
Alkaline Phosphatase: 106 [IU]/L (ref 44–121)
BUN/Creatinine Ratio: 6 — ABNORMAL LOW (ref 10–24)
BUN: 9 mg/dL (ref 8–27)
Bilirubin Total: 2.6 mg/dL — ABNORMAL HIGH (ref 0.0–1.2)
CO2: 20 mmol/L (ref 20–29)
Calcium: 8.5 mg/dL — ABNORMAL LOW (ref 8.6–10.2)
Chloride: 100 mmol/L (ref 96–106)
Creatinine, Ser: 1.41 mg/dL — ABNORMAL HIGH (ref 0.76–1.27)
Globulin, Total: 4.1 g/dL (ref 1.5–4.5)
Glucose: 93 mg/dL (ref 70–99)
Potassium: 4.5 mmol/L (ref 3.5–5.2)
Sodium: 133 mmol/L — ABNORMAL LOW (ref 134–144)
Total Protein: 6.6 g/dL (ref 6.0–8.5)
eGFR: 51 mL/min/{1.73_m2} — ABNORMAL LOW (ref >59)

## 2023-12-07 LAB — CULTURE, BODY FLUID W GRAM STAIN -BOTTLE: Culture: NO GROWTH

## 2023-12-07 LAB — PROTIME-INR
INR: 1.3 — ABNORMAL HIGH (ref 0.9–1.2)
Prothrombin Time: 14.8 s — ABNORMAL HIGH (ref 9.1–12.0)

## 2023-12-07 NOTE — Telephone Encounter (Signed)
..     Pre-operative Risk Assessment    Patient Name: Mitchell Oliver  DOB: May 01, 1946 MRN: [id number]   Date of last office visit: 11/26/23 Date of next office visit: NONE   Request for Surgical Clearance    Procedure:   RFA FACET JOINTS  Date of Surgery:  Clearance TBD                                Surgeon:  DR Claria Dice Surgeon's Group or Practice Name:  [facility] Phone number:  [phone] Fax number:  [phone]   Type of Clearance Requested:   - Medical  - Pharmacy:  Hold Clopidogrel (Plavix)     Type of Anesthesia:  Not Indicated   Additional requests/questions:    Jola Babinski   12/07/2023, 1:41 PM

## 2023-12-07 NOTE — Telephone Encounter (Signed)
   Primary Cardiologist: Kristeen Miss, MD  Chart reviewed as part of pre-operative protocol coverage. Given past medical history and time since last visit, based on ACC/AHA guidelines, Mitchell Oliver would be at acceptable risk for the planned procedure without further cardiovascular testing.   Patient was advised that if he develops new symptoms prior to surgery to contact our office to arrange a follow-up appointment.  He verbalized understanding.  Per office protocol, he may hold Plavix for 5 days prior to procedure and should resume as soon as hemodynamically stable postoperatively.  I will route this recommendation to the requesting party via Epic fax function and remove from pre-op pool.  Please call with questions.  Levi Aland, NP-C 12/07/2023, 3:55 PM [address], [address] Office [phone] Fax [phone]

## 2023-12-08 ENCOUNTER — Other Ambulatory Visit: Payer: Self-pay | Admitting: Family Medicine

## 2023-12-08 ENCOUNTER — Telehealth: Payer: Self-pay | Admitting: Internal Medicine

## 2023-12-08 ENCOUNTER — Other Ambulatory Visit: Payer: Self-pay | Admitting: Internal Medicine

## 2023-12-08 NOTE — Telephone Encounter (Signed)
I had a long conversation with patient's daughter [name] today about recent blood work and liver biopsy results.  Liver biopsy showed cirrhosis, etiology remains unclear.  Meeting with transplant hepatology later this week.  In regards to his hypervolemia, believes his abdomen is becoming distended again.  Will place order for standing paracentesis Q 1 to 2 weeks.  Did have a bump in his creatinine since starting diuretics.  Will decrease down to Lasix 20 mg daily, spironolactone 50 mg daily.  Repeat blood work in 1 week.  Will give IV albumin with paracenteses.  Reports fatigue, some memory loss.  Possible he has hepatic encephalopathy.  Discussed treatment options including lactulose which she does not believe patient would tolerate.  Can consider Xifaxan. Check ammonia level with blood work next week.   Follow-up with me in a few weeks.  Can discuss setting up EGD for variceal screening at that time.

## 2023-12-13 ENCOUNTER — Encounter (HOSPITAL_COMMUNITY): Payer: Self-pay

## 2023-12-13 ENCOUNTER — Emergency Department (HOSPITAL_COMMUNITY): Payer: Medicare Other

## 2023-12-13 ENCOUNTER — Other Ambulatory Visit: Payer: Self-pay

## 2023-12-13 ENCOUNTER — Telehealth: Payer: Self-pay | Admitting: Cardiovascular Disease

## 2023-12-13 ENCOUNTER — Emergency Department (HOSPITAL_COMMUNITY)
Admission: EM | Admit: 2023-12-13 | Discharge: 2023-12-13 | Disposition: A | Discharge status: Home / Self Care | Payer: Medicare Other | Attending: Emergency Medicine | Admitting: Emergency Medicine

## 2023-12-13 DIAGNOSIS — Z79899 Other long term (current) drug therapy: Secondary | ICD-10-CM | POA: Diagnosis not present

## 2023-12-13 DIAGNOSIS — Z7902 Long term (current) use of antithrombotics/antiplatelets: Secondary | ICD-10-CM | POA: Diagnosis not present

## 2023-12-13 DIAGNOSIS — E119 Type 2 diabetes mellitus without complications: Secondary | ICD-10-CM | POA: Diagnosis not present

## 2023-12-13 DIAGNOSIS — I2510 Atherosclerotic heart disease of native coronary artery without angina pectoris: Secondary | ICD-10-CM | POA: Diagnosis not present

## 2023-12-13 DIAGNOSIS — E7220 Disorder of urea cycle metabolism, unspecified: Secondary | ICD-10-CM | POA: Diagnosis not present

## 2023-12-13 DIAGNOSIS — Z7984 Long term (current) use of oral hypoglycemic drugs: Secondary | ICD-10-CM | POA: Diagnosis not present

## 2023-12-13 DIAGNOSIS — J449 Chronic obstructive pulmonary disease, unspecified: Secondary | ICD-10-CM | POA: Insufficient documentation

## 2023-12-13 DIAGNOSIS — Z7951 Long term (current) use of inhaled steroids: Secondary | ICD-10-CM | POA: Insufficient documentation

## 2023-12-13 DIAGNOSIS — I10 Essential (primary) hypertension: Secondary | ICD-10-CM | POA: Insufficient documentation

## 2023-12-13 DIAGNOSIS — R4182 Altered mental status, unspecified: Secondary | ICD-10-CM | POA: Diagnosis present

## 2023-12-13 HISTORY — DX: Unspecified cirrhosis of liver: K74.60

## 2023-12-13 HISTORY — DX: Other ascites: R18.8

## 2023-12-13 LAB — CBC WITH DIFFERENTIAL/PLATELET
Abs Immature Granulocytes: 0.05 10*3/uL (ref 0.00–0.07)
Basophils Absolute: 0.1 10*3/uL (ref 0.0–0.1)
Basophils Relative: 1 %
Eosinophils Absolute: 0.2 10*3/uL (ref 0.0–0.5)
Eosinophils Relative: 2 %
HCT: 41.2 % (ref 39.0–52.0)
Hemoglobin: 13.5 g/dL (ref 13.0–17.0)
Immature Granulocytes: 0 %
Lymphocytes Relative: 24 %
Lymphs Abs: 2.7 10*3/uL (ref 0.7–4.0)
MCH: 30.5 pg (ref 26.0–34.0)
MCHC: 32.8 g/dL (ref 30.0–36.0)
MCV: 93 fL (ref 80.0–100.0)
Monocytes Absolute: 1.3 10*3/uL — ABNORMAL HIGH (ref 0.1–1.0)
Monocytes Relative: 12 %
Neutro Abs: 6.8 10*3/uL (ref 1.7–7.7)
Neutrophils Relative %: 61 %
Platelets: 240 10*3/uL (ref 150–400)
RBC: 4.43 MIL/uL (ref 4.22–5.81)
RDW: 16.5 % — ABNORMAL HIGH (ref 11.5–15.5)
WBC: 11.2 10*3/uL — ABNORMAL HIGH (ref 4.0–10.5)
nRBC: 0 % (ref 0.0–0.2)

## 2023-12-13 LAB — COMPREHENSIVE METABOLIC PANEL WITH GFR
ALT: 34 U/L (ref 0–44)
AST: 46 U/L — ABNORMAL HIGH (ref 15–41)
Albumin: 2.3 g/dL — ABNORMAL LOW (ref 3.5–5.0)
Alkaline Phosphatase: 82 U/L (ref 38–126)
Anion gap: 8 (ref 5–15)
BUN: 11 mg/dL (ref 8–23)
CO2: 24 mmol/L (ref 22–32)
Calcium: 9.1 mg/dL (ref 8.9–10.3)
Chloride: 99 mmol/L (ref 98–111)
Creatinine, Ser: 1.16 mg/dL (ref 0.61–1.24)
GFR, Estimated: >60 mL/min
Glucose, Bld: 90 mg/dL (ref 70–99)
Potassium: 4.5 mmol/L (ref 3.5–5.1)
Sodium: 131 mmol/L — ABNORMAL LOW (ref 135–145)
Total Bilirubin: 3.3 mg/dL — ABNORMAL HIGH (ref 0.0–1.2)
Total Protein: 7.2 g/dL (ref 6.5–8.1)

## 2023-12-13 LAB — URINALYSIS, ROUTINE W REFLEX MICROSCOPIC
Bilirubin Urine: NEGATIVE
Glucose, UA: NEGATIVE mg/dL
Hgb urine dipstick: NEGATIVE
Ketones, ur: NEGATIVE mg/dL
Leukocytes,Ua: NEGATIVE
Nitrite: NEGATIVE
Protein, ur: NEGATIVE mg/dL
Specific Gravity, Urine: 1.006 (ref 1.005–1.030)
pH: 7 (ref 5.0–8.0)

## 2023-12-13 LAB — AMMONIA: Ammonia: 44 umol/L — ABNORMAL HIGH (ref 9–35)

## 2023-12-13 NOTE — Telephone Encounter (Signed)
Called Maggie from requesting office made her aware patient has been cleared and those notes have been sent since 12/07/23 I resent notes again today she voiced that she has received them

## 2023-12-13 NOTE — Discharge Instructions (Signed)
Your lab test and your exam are reassuring today.  As discussed your ammonia level mildly elevated at 44, but given your reassuring exam this does not mean a whole lot as discussed.  I do recommend starting the lactulose today which you are prescribed by Dr. Marletta Lor as this will help control your ammonia level.  You may decide to go for paracentesis tomorrow, although your abdominal exam today does not suggest you emergently need to have this completed.

## 2023-12-13 NOTE — ED Triage Notes (Signed)
Pt arrived via POV from home c/o altered mental status X 2 days. Pt recently had blood work drawn at [facility] for a recheck of his ammonia levels. 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

## 2023-12-13 NOTE — ED Provider Notes (Signed)
[place] EMERGENCY DEPARTMENT AT [facility] Provider Note   CSN: [id number] Arrival date & time: 12/13/23  1432     History  Chief Complaint  Patient presents with   Altered Mental Status    Mitchell Oliver is a 78 y.o. male with history including type 2 diabetes, hyperlipidemia, hypertension, CAD and COPD with new onset liver cirrhosis with ascites presenting for evaluation of possible altered mental status.  Girlfriend at the bedside states that he has been increasingly drowsy the past 2 days.  Patient does not agree with this statement, stating he had insomnia Saturday night so did spend a good part of yesterday, Sunday sleeping, but had a good night sleep last night and has been awake today.  Girlfriend does note increased problems with memory as well.  He has had no fevers or chills, nausea or vomiting, really has no complaints and is not interested in being admitted to the hospital at this time.  He was told to come in for evaluation and for possible paracentesis.  They were told to hold off as long as possible as he has had paracentesis 2 weeks in a row and has been almost 2 weeks since his last tap.  He denies significant abdominal distention at this time.  He denies fevers or chills.  He states he can return tomorrow as an outpatient if he needs to have fluid removed but he does not want to be admitted.  Girlfriend is comfortable with this plan, she does not need to go home until tomorrow evening and can stay with him until then.  The history is provided by the patient and a friend.       Home Medications Prior to Admission medications   Medication Sig Start Date End Date Taking? Authorizing Provider  albuterol (VENTOLIN HFA) 108 (90 Base) MCG/ACT inhaler INHALE 2 PUFFS INTO THE LUNGS EVERY 6 HOURS AS NEEDED FOR WHEEZING OR SHORTNESS OF BREATH Patient taking differently: Inhale 2 puffs into the lungs daily. 07/13/23   [name], DO  amLODipine (NORVASC) 2.5 MG  tablet TAKE 1 TABLET(2.5 MG) BY MOUTH DAILY 09/07/23   Nahser, Deloris Ping, MD  Ascorbic Acid (VITAMIN C PO) Take 4,000 mg by mouth daily.    [provider]  benazepril (LOTENSIN) 20 MG tablet TAKE 1 TABLET(20 MG) BY MOUTH DAILY 11/10/23   [name], DO  Cholecalciferol 25 MCG (1000 UT) tablet Take 1,000 Units by mouth daily. Patient not taking: Reported on 11/26/2023    [provider]  clopidogrel (PLAVIX) 75 MG tablet TAKE 1 TABLET(75 MG) BY MOUTH DAILY 07/12/23   Everlene Other G, DO  Cyanocobalamin (VITAMIN B-12 CR) 1000 MCG TBCR Take 1 tablet by mouth every evening. Patient not taking: Reported on 11/26/2023    [provider]  dorzolamide-timolol (COSOPT) 22.3-6.8 MG/ML ophthalmic solution Place 1 drop into both eyes 2 (two) times daily. 03/28/21   [provider]  fluconazole (DIFLUCAN) 150 MG tablet Take 1 tablet orally. If symptoms not resolved, take second dose on day 3 Patient not taking: Reported on 11/26/2023 10/14/23   Earnest Bailey K, DO  fluticasone (FLONASE) 50 MCG/ACT nasal spray SHAKE LIQUID AND USE 1 SPRAY IN EACH NOSTRIL DAILY AS NEEDED FOR ALLERGIES OR RHINITIS 04/23/23   [name], DO  furosemide (LASIX) 40 MG tablet Take 1 tablet (40 mg total) by mouth daily. 12/01/23 11/30/24  Lanelle Bal, DO  ipratropium (ATROVENT) 0.06 % nasal spray Place 2 sprays  into both nostrils 4 (four) times daily. 12/06/23   [name], DO  Iron, Ferrous Sulfate, 325 (65 Fe) MG TABS Take 325 mg by mouth every other day. 08/25/22   [name], DO  JARDIANCE 10 MG TABS tablet TAKE 1 TABLET(10 MG) BY MOUTH DAILY 12/09/23   Everlene Other G, DO  lactulose (CHRONULAC) 10 GM/15ML solution Take 30 mLs by mouth 3 (three) times daily. 12/10/23 12/09/24  [provider]  latanoprost (XALATAN) 0.005 % ophthalmic solution Place 1 drop into both eyes at bedtime. 08/21/23   [provider]  metoprolol succinate (TOPROL XL) 25 MG 24 hr tablet Take 1 tablet (25  mg total) by mouth daily. 07/23/22   Gaston Islam., NP  ondansetron (ZOFRAN) 4 MG tablet Take 1 tablet (4 mg total) by mouth every 6 (six) hours as needed for nausea. 10/07/23   Sherryll Burger, Pratik D, DO  ondansetron (ZOFRAN-ODT) 4 MG disintegrating tablet Take 1 tablet (4 mg total) by mouth every 8 (eight) hours as needed for nausea or vomiting. 08/18/23   Lanelle Bal, DO  pantoprazole (PROTONIX) 40 MG tablet TAKE 1 TABLET(40 MG) BY MOUTH EVERY MORNING 08/18/23   Lanelle Bal, DO  polyethylene glycol (MIRALAX / GLYCOLAX) 17 g packet Take 17 g by mouth daily as needed for mild constipation. 10/07/23   Sherryll Burger, Pratik D, DO  spironolactone (ALDACTONE) 100 MG tablet Take 1 tablet (100 mg total) by mouth daily. 12/01/23 11/30/24  Lanelle Bal, DO  tiZANidine (ZANAFLEX) 2 MG tablet Take 2 mg by mouth 2 (two) times daily as needed. 09/20/23   [provider]  Zinc Acetate 50 MG CAPS Take 50 mg by mouth every evening.    [provider]      Allergies    Bee venom, Shellfish allergy, and Lidocaine    Review of Systems   Review of Systems  Constitutional:  Negative for chills and fever.  HENT:  Negative for congestion and sore throat.   Eyes: Negative.   Respiratory:  Negative for chest tightness and shortness of breath.   Cardiovascular:  Negative for chest pain.  Gastrointestinal:  Negative for abdominal pain, nausea and vomiting.  Genitourinary: Negative.   Musculoskeletal:  Negative for arthralgias, joint swelling and neck pain.  Skin: Negative.  Negative for rash and wound.  Neurological:  Negative for dizziness, tremors, weakness, light-headedness, numbness and headaches.  Psychiatric/Behavioral: Negative.  Negative for confusion and hallucinations.     Physical Exam Updated Vital Signs BP 129/88 (BP Location: Right Arm)   Pulse 60   Temp 97.8 F (36.6 C) (Oral)   Resp 19   Ht 6\' 2"  (1.88 m)   Wt 101.5 kg   SpO2 100%   BMI 28.73 kg/m  Physical  Exam Vitals and nursing note reviewed.  Constitutional:      Appearance: He is well-developed.  HENT:     Head: Normocephalic and atraumatic.  Eyes:     Conjunctiva/sclera: Conjunctivae normal.  Cardiovascular:     Rate and Rhythm: Normal rate and regular rhythm.     Heart sounds: Normal heart sounds.  Pulmonary:     Effort: Pulmonary effort is normal.     Breath sounds: Normal breath sounds. No wheezing.  Abdominal:     General: Bowel sounds are normal. There is distension.     Palpations: Abdomen is soft. There is no fluid wave.     Tenderness: There is no abdominal tenderness. There is no guarding  or rebound.     Comments: Soft distention with no obvious fluid wave.  Increased tympany to percussion.  Musculoskeletal:        General: Normal range of motion.     Cervical back: Normal range of motion.  Skin:    General: Skin is warm and dry.  Neurological:     General: No focal deficit present.     Mental Status: He is alert and oriented to person, place, and time.     Cranial Nerves: No cranial nerve deficit.     Motor: No weakness.     Comments: No asterixis.     ED Results / Procedures / Treatments   Labs (all labs ordered are listed, but only abnormal results are displayed) Labs Reviewed  AMMONIA - Abnormal; Notable for the following components:      Result Value   Ammonia 44 (*)    All other components within normal limits  COMPREHENSIVE METABOLIC PANEL - Abnormal; Notable for the following components:   Sodium 131 (*)    Albumin 2.3 (*)    AST 46 (*)    Total Bilirubin 3.3 (*)    All other components within normal limits  CBC WITH DIFFERENTIAL/PLATELET - Abnormal; Notable for the following components:   WBC 11.2 (*)    RDW 16.5 (*)    Monocytes Absolute 1.3 (*)    All other components within normal limits  URINALYSIS, ROUTINE W REFLEX MICROSCOPIC    EKG None  Radiology DG Abdomen 1 View Result Date: 12/13/2023 CLINICAL DATA:  abdominal distention,  tympany, no obvious fluid wave. cirrhosis EXAM: ABDOMEN - 1 VIEW COMPARISON:  CT a 10/04/2023 FINDINGS: No bowel dilatation or evidence of obstruction. Small to moderate volume of stool in the colon. No evidence of free air on the supine views. No centralization of bowel gas as seen with ascites. Known gallstones are faintly visualized. Enteric sutures in the right abdomen. IMPRESSION: 1. Nonobstructive bowel gas pattern. 2. Cholelithiasis. Electronically Signed   By: Narda Rutherford M.D.   On: 12/13/2023 19:44    Procedures Procedures    Medications Ordered in ED Medications - No data to display  ED Course/ Medical Decision Making/ A&P                                 Medical Decision Making Patient presenting out of concern for possible hepatic encephalopathy with new diagnosis of cirrhosis.  He is not confused at this time and has no exam findings to suggest hepatic encephalopathy.  He is awake and alert with no obvious neurologic deficits.  He has no other complaints at this time, he was also concerned about possible need for paracentesis, although denies abdominal pain or significant distention at this time.  He has had no fevers or chills, no nausea, vomiting, appetite has been fair.  We discussed pros and cons of admission versus outpatient follow-up, patient is adverse to being admitted but would be agreeable for outpatient paracentesis, in fact girlfriend states he has a standing order and can schedule this as needed.  He has no obvious need for paracentesis at this time.  He has no shortness of breath, abdomen is soft and nontender.  He was prescribed lactulose which he will be starting tomorrow morning and he agrees to do this.  Amount and/or Complexity of Data Reviewed Labs: ordered.    Details: Labs reviewed, ammonia is borderline elevated at 44, his  c-Met reveals a mild hyponatremia at 131, albumin 2.3, AST 46, total bilirubin is 3.3.  Mild leukocytosis with a WBC count of 11.2.   Urinalysis is negative. Radiology: ordered.    Details: KUB negative for acute findings. Discussion of management or test interpretation with external provider(s): Patient was discussed with Dr. Marletta Lor including patient's presentation and today's labs.  Agrees with discharge home as long as family and caregivers are happy with this plan.           Final Clinical Impression(s) / ED Diagnoses Final diagnoses:  Hyperammonemia [facility])    Rx / DC Orders ED Discharge Orders     None         Victoriano Lain 12/13/23 2150    Bethann Berkshire, MD 12/15/23 [phone]

## 2023-12-13 NOTE — Telephone Encounter (Signed)
Follow Up:      Seward Grater is checking on the status of this patient's clearance. Please fax asap to [phone].

## 2023-12-15 ENCOUNTER — Ambulatory Visit: Payer: Medicare Other | Admitting: Internal Medicine

## 2023-12-22 ENCOUNTER — Ambulatory Visit: Payer: Medicare Other | Admitting: Internal Medicine

## 2023-12-30 ENCOUNTER — Encounter: Payer: Self-pay | Admitting: *Deleted

## 2023-12-30 ENCOUNTER — Ambulatory Visit: Payer: Medicare Other | Admitting: Internal Medicine

## 2023-12-30 ENCOUNTER — Encounter: Payer: Self-pay | Admitting: Internal Medicine

## 2023-12-30 VITALS — BP 119/72 | HR 73 | Temp 96.8°F | Ht 74.0 in | Wt 212.0 lb

## 2023-12-30 DIAGNOSIS — R188 Other ascites: Secondary | ICD-10-CM

## 2023-12-30 DIAGNOSIS — R7989 Other specified abnormal findings of blood chemistry: Secondary | ICD-10-CM

## 2023-12-30 DIAGNOSIS — K7460 Unspecified cirrhosis of liver: Secondary | ICD-10-CM

## 2023-12-30 DIAGNOSIS — K7682 Hepatic encephalopathy: Secondary | ICD-10-CM

## 2023-12-30 DIAGNOSIS — K219 Gastro-esophageal reflux disease without esophagitis: Secondary | ICD-10-CM

## 2023-12-30 MED ORDER — RIFAXIMIN 550 MG PO TABS: 550.0000 mg | ORAL_TABLET | Freq: Two times a day (BID) | ORAL | 11 refills | Status: DC

## 2023-12-30 NOTE — H&P (View-Only) (Signed)
 Referring Provider: [name], DO Primary Care Physician:  [name], DO Primary GI:  Dr. Marletta Lor  Chief Complaint  Patient presents with   Follow-up    Patient here today for a follow up on his Cirrhosis.Patient denies any current issues. Patient takes lactulose 30 ml BID, spironolactone 100 mg every day, lasix 40 mg daily. He is having two bm per day.     HPI:   Mitchell Oliver is a 78 y.o. male who presents to clinic today for follow-up visit.     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 Serological workup including acute hepatitis panel negative, iron studies normal, ceruloplasmin negative, ANA positive though IgG normal, ASMA normal, AMA normal.  Liver biopsy 12/02/2023 with well-formed cirrhosis, portal histiocytes, etiology not apparent histologically.  MELD 3.0 = 20, CPT C   Portal hypertension with ascites: Currently taking spironolactone 100 mg daily, Lasix 40 mg daily.  Has required paracentesis x 2 though none in the last month.  Denies any abdominal distention for me today.  Has also cut back on all of the salt with his diet.  Was previously using a lot of table salt which he has since stopped.  Variceal screening: Needs EGD for variceal screening.  Hepatic encephalopathy: Mild, reports fatigue, mental fog.  Started on lactulose which he states is helping.  Reports he is taking this twice a day.  Having a bowel movement 1-2 times a day.  Most recent blood work 12/28/2023 showed worsening of his LFTs, T. bili 2.9 (indirect 1.69), alk phos 123, AST 61, ALT 64.  Patient adamantly denies any abdominal pain for me today.  Has also worked on his diet, drinking protein shakes 1-2 times daily.  IPMN:  Small, 4 mm, recommended surveillance imaging in 2 years.   Past Medical History:  Diagnosis Date   Allergy    Ascites    Cirrhosis (HCC)    COPD (chronic obstructive pulmonary disease) (HCC)    CVA (cerebral vascular accident)  (HCC) 10/26/2010   denies residual on 07/22/2015   ED (erectile dysfunction)    Family history of adverse reaction to anesthesia    "think my mother had real bad headaches after anesthesia"   GERD (gastroesophageal reflux disease)    Hypercholesterolemia    Hypertension    IFG (impaired fasting glucose)    NSTEMI (non-ST elevated myocardial infarction) (HCC)    Pneumonia ~ 2005    Past Surgical History:  Procedure Laterality Date   APPENDECTOMY  2009 [facility]   BACK SURGERY     BIOPSY  05/04/2014   Procedure: BIOPSY;  Surgeon: West Bali, MD;  Location: [facility];  Service: Endoscopy;;   CARDIAC CATHETERIZATION N/A 07/18/2015   Procedure: Left Heart Cath and Coronary Angiography;  Surgeon: Lyn Records, MD;  Location: [facility] INVASIVE CV LAB;  Service: Cardiovascular;  Laterality: N/A;   CARDIAC CATHETERIZATION N/A 07/18/2015   Procedure: Coronary Stent Intervention;  Surgeon: Lyn Records, MD;  Location: [facility] INVASIVE CV LAB;  Service: Cardiovascular;  Laterality: N/A;   CARDIAC CATHETERIZATION N/A 07/22/2015   Procedure: Coronary Stent Intervention;  Surgeon: Lyn Records, MD;  Location: [facility] INVASIVE CV LAB;  Service: Cardiovascular;  Laterality: N/A;   COLONOSCOPY  2009   COLONOSCOPY N/A 08/04/2019   Procedure: COLONOSCOPY;  Surgeon: West Bali, MD;  Location: [facility];  Service: Endoscopy;  Laterality: N/A;  12:30   ESOPHAGOGASTRODUODENOSCOPY N/A 05/04/2014   Procedure:  ESOPHAGOGASTRODUODENOSCOPY (EGD);  Surgeon: West Bali, MD;  Location: [facility];  Service: Endoscopy;  Laterality: N/A;  9:15   IR PARACENTESIS  12/02/2023   IR TRANSCATHETER BX  12/02/2023   POLYPECTOMY  08/04/2019   Procedure: POLYPECTOMY;  Surgeon: West Bali, MD;  Location: [facility];  Service: Endoscopy;;  hepatic flexure, ascending colon, transverse colon, splenic flexure, descending colon    POSTERIOR LAMINECTOMY / DECOMPRESSION LUMBAR SPINE  06/03/2015   SAVORY DILATION N/A 05/04/2014    Procedure: SAVORY DILATION;  Surgeon: West Bali, MD;  Location: [facility];  Service: Endoscopy;  Laterality: N/A;    Current Outpatient Medications  Medication Sig Dispense Refill   albuterol (VENTOLIN HFA) 108 (90 Base) MCG/ACT inhaler INHALE 2 PUFFS INTO THE LUNGS EVERY 6 HOURS AS NEEDED FOR WHEEZING OR SHORTNESS OF BREATH (Patient taking differently: Inhale 2 puffs into the lungs daily.) 8.5 g 1   amLODipine (NORVASC) 2.5 MG tablet TAKE 1 TABLET(2.5 MG) BY MOUTH DAILY 90 tablet 3   benazepril (LOTENSIN) 20 MG tablet TAKE 1 TABLET(20 MG) BY MOUTH DAILY 30 tablet 2   clopidogrel (PLAVIX) 75 MG tablet TAKE 1 TABLET(75 MG) BY MOUTH DAILY 90 tablet 0   dorzolamide-timolol (COSOPT) 22.3-6.8 MG/ML ophthalmic solution Place 1 drop into both eyes 2 (two) times daily.     furosemide (LASIX) 40 MG tablet Take 1 tablet (40 mg total) by mouth daily. 30 tablet 11   ipratropium (ATROVENT) 0.06 % nasal spray Place 2 sprays into both nostrils 4 (four) times daily. 15 mL 0   Iron, Ferrous Sulfate, 325 (65 Fe) MG TABS Take 325 mg by mouth every other day. 45 tablet 1   JARDIANCE 10 MG TABS tablet TAKE 1 TABLET(10 MG) BY MOUTH DAILY 90 tablet 1   lactulose (CHRONULAC) 10 GM/15ML solution Take 30 mLs by mouth 3 (three) times daily.     latanoprost (XALATAN) 0.005 % ophthalmic solution Place 1 drop into both eyes at bedtime.     ondansetron (ZOFRAN) 4 MG tablet Take 1 tablet (4 mg total) by mouth every 6 (six) hours as needed for nausea. 20 tablet 0   pantoprazole (PROTONIX) 40 MG tablet Take 40 mg by mouth daily.     polyethylene glycol (MIRALAX / GLYCOLAX) 17 g packet Take 17 g by mouth daily as needed for mild constipation. 14 each 0   spironolactone (ALDACTONE) 100 MG tablet Take 1 tablet (100 mg total) by mouth daily. 30 tablet 11   tiZANidine (ZANAFLEX) 2 MG tablet Take 2 mg by mouth 2 (two) times daily as needed. (Patient not taking: Reported on 12/30/2023)     No current facility-administered  medications for this visit.    Allergies as of 12/30/2023 - Review Complete 12/30/2023  Allergen Reaction Noted   Bee venom Anaphylaxis 12/10/2023   Shellfish allergy Anaphylaxis 12/10/2023   Lidocaine Hives and Rash 10/04/2023    Family History  Problem Relation Age of Onset   Hypertension Mother    Hypertension Father    Heart attack Father    Stroke Father    Colon cancer Neg Hx    Colon polyps Neg Hx     Social History   Socioeconomic History   Marital status: Divorced    Spouse name: Not on file   Number of children: 3   Years of education: Not on file   Highest education level: Some college, no degree  Occupational History   Occupation: Airline pilot  Tobacco Use  Smoking status: Former    Current packs/day: 0.00    Average packs/day: 2.0 packs/day for 24.2 years (48.3 ttl pk-yrs)    Types: Cigarettes    Start date: 50    Quit date: 12/24/1985    Years since quitting: 38.0   Smokeless tobacco: Never  Vaping Use   Vaping status: Never Used  Substance and Sexual Activity   Alcohol use: Yes    Alcohol/week: 6.0 standard drinks of alcohol    Types: 2 Glasses of wine, 4 Cans of beer per week   Drug use: No   Sexual activity: Yes  Other Topics Concern   Not on file  Social History Narrative   3 daughters, 2 live locally, one lives in [place].   3 grandsons   Active in grandson's travel ball. Works part time for [employer].    Social Drivers of Corporate investment banker Strain: Low Risk  (10/18/2023)   Overall Financial Resource Strain (CARDIA)    Difficulty of Paying Living Expenses: Not very hard  Food Insecurity: Low Risk  (12/10/2023)   Received from [facility]   Hunger Vital Sign    Worried About Running Out of Food in the Last Year: Never true    Ran Out of Food in the Last Year: Never true  Transportation Needs: No Transportation Needs (12/10/2023)   Received from [facility]    In the past 12 months, has lack of  reliable transportation kept you from medical appointments, meetings, work or from getting things needed for daily living? : No  Physical Activity: Unknown (10/18/2023)   Exercise Vital Sign    Days of Exercise per Week: 0 days    Minutes of Exercise per Session: Not on file  Recent Concern: Physical Activity - Inactive (10/18/2023)   Exercise Vital Sign    Days of Exercise per Week: 0 days    Minutes of Exercise per Session: 60 min  Stress: No Stress Concern Present (10/18/2023)   [employer] of Occupational Health - Occupational Stress Questionnaire    Feeling of Stress : Not at all  Social Connections: Moderately Integrated (10/18/2023)   Social Connection and Isolation Panel [NHANES]    Frequency of Communication with Friends and Family: More than three times a week    Frequency of Social Gatherings with Friends and Family: Twice a week    Attends Religious Services: More than 4 times per year    Active Member of [employer] or Organizations: Yes    Attends Banker Meetings: 1 to 4 times per year    Marital Status: Divorced    Subjective: Review of Systems  Constitutional:  Negative for chills and fever.  HENT:  Negative for congestion and hearing loss.   Eyes:  Negative for blurred vision and double vision.  Respiratory:  Negative for cough and shortness of breath.   Cardiovascular:  Negative for chest pain and palpitations.  Gastrointestinal:  Negative for abdominal pain, blood in stool, constipation, diarrhea, heartburn, melena and vomiting.  Genitourinary:  Negative for dysuria and urgency.  Musculoskeletal:  Negative for joint pain and myalgias.  Skin:  Negative for itching and rash.  Neurological:  Negative for dizziness and headaches.  Psychiatric/Behavioral:  Negative for depression. The patient is not nervous/anxious.      Objective: BP 119/72 (BP Location: Left Arm, Patient Position: Sitting, Cuff Size: Normal)   Pulse 73   Temp (!) 96.8 F (36 C)  (Oral)   Ht  6\' 2"  (1.88 m)   Wt 212 lb (96.2 kg)   BMI 27.22 kg/m  Physical Exam Constitutional:      Appearance: Normal appearance.  HENT:     Head: Normocephalic and atraumatic.  Eyes:     General: Scleral icterus present.     Extraocular Movements: Extraocular movements intact.     Conjunctiva/sclera: Conjunctivae normal.  Cardiovascular:     Rate and Rhythm: Normal rate and regular rhythm.  Pulmonary:     Effort: Pulmonary effort is normal.     Breath sounds: Normal breath sounds.  Abdominal:     General: Bowel sounds are normal. There is distension.     Palpations: Abdomen is soft.  Musculoskeletal:        General: Normal range of motion.     Cervical back: Normal range of motion and neck supple.  Skin:    General: Skin is warm.  Neurological:     General: No focal deficit present.     Mental Status: He is alert and oriented to person, place, and time.  Psychiatric:        Mood and Affect: Mood normal.        Behavior: Behavior normal.      Assessment/Plan:   1.  Cirrhosis-New diagnosis, likely secondary to [facility], serologic workup as above.  Avoid all alcohol going forward.  MELD 3.0 = 20, CPT C   Needs EGD for variceal screening.  Will schedule today. The risks including infection, bleed, or perforation as well as benefits, limitations, alternatives and imponderables have been reviewed with the patient. Potential for esophageal dilation, biopsy, etc. have also been reviewed.  Questions have been answered. All parties agreeable.  Portal hypertension with ascites: Continue Lasix 40 mg daily, spironolactone 100 mg daily.  Monitor daily weights and BP.   Nutrition recommendations:  High-protein diet from a primarily plant-based diet. Avoid red meat.  No raw or undercooked meat, seafood, or shellfish. Low-fat/cholesterol/carbohydrate diet. Limit sodium to no more than 2000 mg/day including everything that you eat and drink. Recommend at least 30 minutes of aerobic  and resistance exercise 3 days/week.  Hepatic encephalopathy: Slight improvement with lactulose.  Counseled that the goal is to have at least 3 loose bowel movements a day.  Unsure if he is actually taking lactulose as he supposed to.  Will add on Xifaxan twice daily as an adjunct for this.  Discussed with patient that we will likely need to perform prior authorization and even with that this may not be approved.  Continue to follow-up with [facility] with [facility].   Will check CBC and HFP today. Call with results.   2.  IPMN-small, 4 mm, recommended surveillance imaging in 2 years.  3.  Chronic GERD-pantoprazole stopped previously due to increased risk of SBP.  Patient states he was absolutely miserable during this time period, unable to sleep due to his reflux symptoms.  Restarted on his own.  Discussed increased risk in relation to PPI and he understands.  Follow-up after upper endoscopy   12/30/2023 11:49 AM   Disclaimer: This note was dictated with voice recognition software. Similar sounding words can inadvertently be transcribed and may not be corrected upon review.

## 2023-12-30 NOTE — Progress Notes (Signed)
 Referring Provider: [name], DO Primary Care Physician:  [name], DO Primary GI:  Dr. Marletta Lor  Chief Complaint  Patient presents with   Follow-up    Patient here today for a follow up on his Cirrhosis.Patient denies any current issues. Patient takes lactulose 30 ml BID, spironolactone 100 mg every day, lasix 40 mg daily. He is having two bm per day.     HPI:   Mitchell Oliver is a 78 y.o. male who presents to clinic today for follow-up visit.     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 Serological workup including acute hepatitis panel negative, iron studies normal, ceruloplasmin negative, ANA positive though IgG normal, ASMA normal, AMA normal.  Liver biopsy 12/02/2023 with well-formed cirrhosis, portal histiocytes, etiology not apparent histologically.  MELD 3.0 = 20, CPT C   Portal hypertension with ascites: Currently taking spironolactone 100 mg daily, Lasix 40 mg daily.  Has required paracentesis x 2 though none in the last month.  Denies any abdominal distention for me today.  Has also cut back on all of the salt with his diet.  Was previously using a lot of table salt which he has since stopped.  Variceal screening: Needs EGD for variceal screening.  Hepatic encephalopathy: Mild, reports fatigue, mental fog.  Started on lactulose which he states is helping.  Reports he is taking this twice a day.  Having a bowel movement 1-2 times a day.  Most recent blood work 12/28/2023 showed worsening of his LFTs, T. bili 2.9 (indirect 1.69), alk phos 123, AST 61, ALT 64.  Patient adamantly denies any abdominal pain for me today.  Has also worked on his diet, drinking protein shakes 1-2 times daily.  IPMN:  Small, 4 mm, recommended surveillance imaging in 2 years.   Past Medical History:  Diagnosis Date   Allergy    Ascites    Cirrhosis (HCC)    COPD (chronic obstructive pulmonary disease) (HCC)    CVA (cerebral vascular accident)  (HCC) 10/26/2010   denies residual on 07/22/2015   ED (erectile dysfunction)    Family history of adverse reaction to anesthesia    "think my mother had real bad headaches after anesthesia"   GERD (gastroesophageal reflux disease)    Hypercholesterolemia    Hypertension    IFG (impaired fasting glucose)    NSTEMI (non-ST elevated myocardial infarction) (HCC)    Pneumonia ~ 2005    Past Surgical History:  Procedure Laterality Date   APPENDECTOMY  2009 [facility]   BACK SURGERY     BIOPSY  05/04/2014   Procedure: BIOPSY;  Surgeon: West Bali, MD;  [facility];  Service: Endoscopy;;   CARDIAC CATHETERIZATION N/A 07/18/2015   Procedure: Left Heart Cath and Coronary Angiography;  Surgeon: Lyn Records, MD;  Location: [facility] INVASIVE CV LAB;  Service: Cardiovascular;  Laterality: N/A;   CARDIAC CATHETERIZATION N/A 07/18/2015   Procedure: Coronary Stent Intervention;  Surgeon: Lyn Records, MD;  Location: [facility] INVASIVE CV LAB;  Service: Cardiovascular;  Laterality: N/A;   CARDIAC CATHETERIZATION N/A 07/22/2015   Procedure: Coronary Stent Intervention;  Surgeon: Lyn Records, MD;  Location: [facility] INVASIVE CV LAB;  Service: Cardiovascular;  Laterality: N/A;   COLONOSCOPY  2009   COLONOSCOPY N/A 08/04/2019   Procedure: COLONOSCOPY;  Surgeon: West Bali, MD;  Location: [facility];  Service: Endoscopy;  Laterality: N/A;  12:30   ESOPHAGOGASTRODUODENOSCOPY N/A 05/04/2014   Procedure:  ESOPHAGOGASTRODUODENOSCOPY (EGD);  Surgeon: West Bali, MD;  [facility];  Service: Endoscopy;  Laterality: N/A;  9:15   IR PARACENTESIS  12/02/2023   IR TRANSCATHETER BX  12/02/2023   POLYPECTOMY  08/04/2019   Procedure: POLYPECTOMY;  Surgeon: West Bali, MD;  [facility]: [facility];  Service: Endoscopy;;  hepatic flexure, ascending colon, transverse colon, splenic flexure, descending colon    POSTERIOR LAMINECTOMY / DECOMPRESSION LUMBAR SPINE  06/03/2015   SAVORY DILATION N/A 05/04/2014    Procedure: SAVORY DILATION;  Surgeon: West Bali, MD;  [facility]: [facility];  Service: Endoscopy;  Laterality: N/A;    Current Outpatient Medications  Medication Sig Dispense Refill   albuterol (VENTOLIN HFA) 108 (90 Base) MCG/ACT inhaler INHALE 2 PUFFS INTO THE LUNGS EVERY 6 HOURS AS NEEDED FOR WHEEZING OR SHORTNESS OF BREATH (Patient taking differently: Inhale 2 puffs into the lungs daily.) 8.5 g 1   amLODipine (NORVASC) 2.5 MG tablet TAKE 1 TABLET(2.5 MG) BY MOUTH DAILY 90 tablet 3   benazepril (LOTENSIN) 20 MG tablet TAKE 1 TABLET(20 MG) BY MOUTH DAILY 30 tablet 2   clopidogrel (PLAVIX) 75 MG tablet TAKE 1 TABLET(75 MG) BY MOUTH DAILY 90 tablet 0   dorzolamide-timolol (COSOPT) 22.3-6.8 MG/ML ophthalmic solution Place 1 drop into both eyes 2 (two) times daily.     furosemide (LASIX) 40 MG tablet Take 1 tablet (40 mg total) by mouth daily. 30 tablet 11   ipratropium (ATROVENT) 0.06 % nasal spray Place 2 sprays into both nostrils 4 (four) times daily. 15 mL 0   Iron, Ferrous Sulfate, 325 (65 Fe) MG TABS Take 325 mg by mouth every other day. 45 tablet 1   JARDIANCE 10 MG TABS tablet TAKE 1 TABLET(10 MG) BY MOUTH DAILY 90 tablet 1   lactulose (CHRONULAC) 10 GM/15ML solution Take 30 mLs by mouth 3 (three) times daily.     latanoprost (XALATAN) 0.005 % ophthalmic solution Place 1 drop into both eyes at bedtime.     ondansetron (ZOFRAN) 4 MG tablet Take 1 tablet (4 mg total) by mouth every 6 (six) hours as needed for nausea. 20 tablet 0   pantoprazole (PROTONIX) 40 MG tablet Take 40 mg by mouth daily.     polyethylene glycol (MIRALAX / GLYCOLAX) 17 g packet Take 17 g by mouth daily as needed for mild constipation. 14 each 0   spironolactone (ALDACTONE) 100 MG tablet Take 1 tablet (100 mg total) by mouth daily. 30 tablet 11   tiZANidine (ZANAFLEX) 2 MG tablet Take 2 mg by mouth 2 (two) times daily as needed. (Patient not taking: Reported on 12/30/2023)     No current facility-administered  medications for this visit.    Allergies as of 12/30/2023 - Review Complete 12/30/2023  Allergen Reaction Noted   Bee venom Anaphylaxis 12/10/2023   Shellfish allergy Anaphylaxis 12/10/2023   Lidocaine Hives and Rash 10/04/2023    Family History  Problem Relation Age of Onset   Hypertension Mother    Hypertension Father    Heart attack Father    Stroke Father    Colon cancer Neg Hx    Colon polyps Neg Hx     Social History   Socioeconomic History   Marital status: Divorced    Spouse name: Not on file   Number of children: 3   Years of education: Not on file   Highest education level: Some college, no degree  Occupational History   Occupation: Airline pilot  Tobacco Use  Smoking status: Former    Current packs/day: 0.00    Average packs/day: 2.0 packs/day for 24.2 years (48.3 ttl pk-yrs)    Types: Cigarettes    Start date: 50    Quit date: 12/24/1985    Years since quitting: 38.0   Smokeless tobacco: Never  Vaping Use   Vaping status: Never Used  Substance and Sexual Activity   Alcohol use: Yes    Alcohol/week: 6.0 standard drinks of alcohol    Types: 2 Glasses of wine, 4 Cans of beer per week   Drug use: No   Sexual activity: Yes  Other Topics Concern   Not on file  Social History Narrative   3 daughters, 2 live locally, one lives in [place].   3 grandsons   Active in grandson's travel ball. Works part time for [employer].    Social Drivers of Corporate investment banker Strain: Low Risk  (10/18/2023)   Overall Financial Resource Strain (CARDIA)    Difficulty of Paying Living Expenses: Not very hard  Food Insecurity: Low Risk  (12/10/2023)   Received from [facility]   Hunger Vital Sign    Worried About Running Out of Food in the Last Year: Never true    Ran Out of Food in the Last Year: Never true  Transportation Needs: No Transportation Needs (12/10/2023)   Received from [facility]    In the past 12 months, has lack of  reliable transportation kept you from medical appointments, meetings, work or from getting things needed for daily living? : No  Physical Activity: Unknown (10/18/2023)   Exercise Vital Sign    Days of Exercise per Week: 0 days    Minutes of Exercise per Session: Not on file  Recent Concern: Physical Activity - Inactive (10/18/2023)   Exercise Vital Sign    Days of Exercise per Week: 0 days    Minutes of Exercise per Session: 60 min  Stress: No Stress Concern Present (10/18/2023)   [employer] of Occupational Health - Occupational Stress Questionnaire    Feeling of Stress : Not at all  Social Connections: Moderately Integrated (10/18/2023)   Social Connection and Isolation Panel [NHANES]    Frequency of Communication with Friends and Family: More than three times a week    Frequency of Social Gatherings with Friends and Family: Twice a week    Attends Religious Services: More than 4 times per year    Active Member of [employer] or Organizations: Yes    Attends Banker Meetings: 1 to 4 times per year    Marital Status: Divorced    Subjective: Review of Systems  Constitutional:  Negative for chills and fever.  HENT:  Negative for congestion and hearing loss.   Eyes:  Negative for blurred vision and double vision.  Respiratory:  Negative for cough and shortness of breath.   Cardiovascular:  Negative for chest pain and palpitations.  Gastrointestinal:  Negative for abdominal pain, blood in stool, constipation, diarrhea, heartburn, melena and vomiting.  Genitourinary:  Negative for dysuria and urgency.  Musculoskeletal:  Negative for joint pain and myalgias.  Skin:  Negative for itching and rash.  Neurological:  Negative for dizziness and headaches.  Psychiatric/Behavioral:  Negative for depression. The patient is not nervous/anxious.      Objective: BP 119/72 (BP Location: Left Arm, Patient Position: Sitting, Cuff Size: Normal)   Pulse 73   Temp (!) 96.8 F (36 C)  (Oral)   Ht  6\' 2"  (1.88 m)   Wt 212 lb (96.2 kg)   BMI 27.22 kg/m  Physical Exam Constitutional:      Appearance: Normal appearance.  HENT:     Head: Normocephalic and atraumatic.  Eyes:     General: Scleral icterus present.     Extraocular Movements: Extraocular movements intact.     Conjunctiva/sclera: Conjunctivae normal.  Cardiovascular:     Rate and Rhythm: Normal rate and regular rhythm.  Pulmonary:     Effort: Pulmonary effort is normal.     Breath sounds: Normal breath sounds.  Abdominal:     General: Bowel sounds are normal. There is distension.     Palpations: Abdomen is soft.  Musculoskeletal:        General: Normal range of motion.     Cervical back: Normal range of motion and neck supple.  Skin:    General: Skin is warm.  Neurological:     General: No focal deficit present.     Mental Status: He is alert and oriented to person, place, and time.  Psychiatric:        Mood and Affect: Mood normal.        Behavior: Behavior normal.      Assessment/Plan:   1.  Cirrhosis-New diagnosis, likely secondary to [facility], serologic workup as above.  Avoid all alcohol going forward.  MELD 3.0 = 20, CPT C   Needs EGD for variceal screening.  Will schedule today. The risks including infection, bleed, or perforation as well as benefits, limitations, alternatives and imponderables have been reviewed with the patient. Potential for esophageal dilation, biopsy, etc. have also been reviewed.  Questions have been answered. All parties agreeable.  Portal hypertension with ascites: Continue Lasix 40 mg daily, spironolactone 100 mg daily.  Monitor daily weights and BP.   Nutrition recommendations:  High-protein diet from a primarily plant-based diet. Avoid red meat.  No raw or undercooked meat, seafood, or shellfish. Low-fat/cholesterol/carbohydrate diet. Limit sodium to no more than 2000 mg/day including everything that you eat and drink. Recommend at least 30 minutes of aerobic  and resistance exercise 3 days/week.  Hepatic encephalopathy: Slight improvement with lactulose.  Counseled that the goal is to have at least 3 loose bowel movements a day.  Unsure if he is actually taking lactulose as he supposed to.  Will add on Xifaxan twice daily as an adjunct for this.  Discussed with patient that we will likely need to perform prior authorization and even with that this may not be approved.  Continue to follow-up with [facility] with [facility].   Will check CBC and HFP today. Call with results.   2.  IPMN-small, 4 mm, recommended surveillance imaging in 2 years.  3.  Chronic GERD-pantoprazole stopped previously due to increased risk of SBP.  Patient states he was absolutely miserable during this time period, unable to sleep due to his reflux symptoms.  Restarted on his own.  Discussed increased risk in relation to PPI and he understands.  Follow-up after upper endoscopy   12/30/2023 11:49 AM   Disclaimer: This note was dictated with voice recognition software. Similar sounding words can inadvertently be transcribed and may not be corrected upon review.

## 2023-12-30 NOTE — Patient Instructions (Signed)
 I am happy to hear that you are doing well.  I am going to check blood work at [facility], I will forward these results to [facility] as well.  We will schedule you for upper endoscopy for variceal screening.  I may elect to treat these with band ligation if you do have them.  Continue to avoid salt in your diet.  Recommended max of 2000 mg daily of sodium.  Continue on lactulose twice daily goal of 3-4 loose bowel movements daily.  I will send in Xifaxan to see if we can get this covered as an adjunct.  Continue Lasix and spironolactone.  Follow-up after upper endoscopy.  It was very nice seeing both you again today.  Dr. Marletta Lor

## 2023-12-30 NOTE — Telephone Encounter (Signed)
 Per [facility] for [place] EGD  Notification or Prior Authorization is not required for the requested services You are not required to submit a notification/prior authorization based on the information provided. If you have general questions about the prior authorization requirements, visit [email or web address] > Clinician Resources > Advance and Admission Notification Requirements. The number above acknowledges your notification. Please write this reference number down for future reference. If you would like to request an organization determination, please call us at [phone]. Decision ID #: [id number]

## 2023-12-30 NOTE — Addendum Note (Signed)
 Addended by: Lanelle Bal on: 12/30/2023 04:40 PM   Modules accepted: Orders

## 2024-01-01 LAB — HEPATIC FUNCTION PANEL
ALT: 59 IU/L — ABNORMAL HIGH (ref 0–44)
AST: 53 IU/L — ABNORMAL HIGH (ref 0–40)
Albumin: 2.9 g/dL — ABNORMAL LOW (ref 3.8–4.8)
Alkaline Phosphatase: 118 IU/L (ref 44–121)
Bilirubin Total: 2.6 mg/dL — ABNORMAL HIGH (ref 0.0–1.2)
Bilirubin, Direct: 0.92 mg/dL — ABNORMAL HIGH (ref 0.00–0.40)
Total Protein: 7.4 g/dL (ref 6.0–8.5)

## 2024-01-01 LAB — CBC
Hematocrit: 43.4 % (ref 37.5–51.0)
Hemoglobin: 14.7 g/dL (ref 13.0–17.7)
MCH: 31.3 pg (ref 26.6–33.0)
MCHC: 33.9 g/dL (ref 31.5–35.7)
MCV: 92 fL (ref 79–97)
Platelets: 182 10*3/uL (ref 150–450)
RBC: 4.7 x10E6/uL (ref 4.14–5.80)
RDW: 14.5 % (ref 11.6–15.4)
WBC: 10.6 10*3/uL (ref 3.4–10.8)

## 2024-01-04 ENCOUNTER — Encounter: Payer: Self-pay | Admitting: Family Medicine

## 2024-01-04 ENCOUNTER — Ambulatory Visit (INDEPENDENT_AMBULATORY_CARE_PROVIDER_SITE_OTHER): Admitting: Family Medicine

## 2024-01-04 VITALS — BP 124/79 | HR 69 | Temp 98.4°F | Ht 74.0 in | Wt 208.0 lb

## 2024-01-04 DIAGNOSIS — J0100 Acute maxillary sinusitis, unspecified: Secondary | ICD-10-CM | POA: Diagnosis not present

## 2024-01-04 DIAGNOSIS — J329 Chronic sinusitis, unspecified: Secondary | ICD-10-CM | POA: Insufficient documentation

## 2024-01-04 MED ORDER — DOXYCYCLINE HYCLATE 100 MG PO TABS: 100.0000 mg | ORAL_TABLET | Freq: Two times a day (BID) | ORAL | 0 refills | Status: DC

## 2024-01-04 NOTE — Progress Notes (Signed)
 Subjective:  Patient ID: Mitchell Oliver, male    DOB: 02/15/1946  Age: 78 y.o. MRN: [id number]  CC:   Chief Complaint  Patient presents with   Sinusitis    Sinus congestion, ears fullness x's 2 weeks.     HPI:  78 year old male presents for evaluation of the above.  2 weeks of symptoms. Reports congestion and rhinorrhea. Ear fullness (feels muffled). No fever. No relieving factors. No other associated symptoms.   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   Erectile dysfunction 06/22/2013   History of CVA 2012 06/22/2013    Social Hx   Social History   Socioeconomic History   Marital status: Divorced    Spouse name: Not on file   Number of children: 3   Years of education: Not on file   Highest education level: Some college, no degree  Occupational History   Occupation: Airline pilot  Tobacco Use   Smoking status: Former    Current packs/day: 0.00    Average packs/day: 2.0 packs/day for 24.2 years (48.3 ttl pk-yrs)    Types: Cigarettes    Start date: 1963    Quit date: 12/24/1985    Years since quitting: 38.0   Smokeless tobacco: Never  Vaping Use   Vaping status: Never Used  Substance and Sexual Activity   Alcohol use: Yes    Alcohol/week: 6.0 standard drinks of alcohol    Types: 2 Glasses of wine, 4 Cans of beer per week   Drug use: No   Sexual activity: Yes  Other Topics Concern   Not on file  Social History Narrative   3 daughters, 2 live locally, one lives in [place].   3  grandsons   Active in grandson's travel ball. Works part time for [employer].    Social Drivers of Corporate investment banker Strain: Low Risk  (10/18/2023)   Overall Financial Resource Strain (CARDIA)    Difficulty of Paying Living Expenses: Not very hard  Food Insecurity: Low Risk  (12/10/2023)   Received from [facility]   Hunger Vital Sign    Worried About Running Out of Food in the Last Year: Never true    Ran Out of Food in the Last Year: Never true  Transportation Needs: No Transportation Needs (12/10/2023)   Received from [facility]    In the past 12 months, has lack of reliable transportation kept you from medical appointments, meetings, work or from getting things needed for daily living? : No  Physical Activity: Unknown (10/18/2023)   Exercise Vital Sign    Days of Exercise per Week: 0 days    Minutes of Exercise per Session: Not on file  Recent Concern: Physical Activity - Inactive (10/18/2023)   Exercise Vital Sign    Days of Exercise per Week: 0 days    Minutes of Exercise per Session: 60 min  Stress: No Stress Concern Present (10/18/2023)   [employer] of Occupational Health - Occupational Stress Questionnaire  Feeling of Stress : Not at all  Social Connections: Moderately Integrated (10/18/2023)   Social Connection and Isolation Panel [NHANES]    Frequency of Communication with Friends and Family: More than three times a week    Frequency of Social Gatherings with Friends and Family: Twice a week    Attends Religious Services: More than 4 times per year    Active Member of [employer] or Organizations: Yes    Attends Banker Meetings: 1 to 4 times per year    Marital Status: Divorced    Review of Systems Per HPI  Objective:  BP 124/79   Pulse 69   Temp 98.4 F (36.9 C)   Ht 6\' 2"  (1.88 m)   Wt 208 lb (94.3 kg)   SpO2 95%   BMI 26.71 kg/m      01/04/2024    3:14 PM 12/30/2023   11:37 AM 12/13/2023    6:54 PM   BP/Weight  Systolic BP 124 119 129  Diastolic BP 79 72 88  Wt. (Lbs) 208 212   BMI 26.71 kg/m2 27.22 kg/m2     Physical Exam Vitals and nursing note reviewed.  Constitutional:      General: He is not in acute distress. HENT:     Head: Normocephalic and atraumatic.     Nose:     Right Sinus: Maxillary sinus tenderness present.     Left Sinus: Maxillary sinus tenderness present.  Eyes:     General:        Right eye: No discharge.        Left eye: No discharge.     Conjunctiva/sclera: Conjunctivae normal.  Pulmonary:     Effort: Pulmonary effort is normal.     Breath sounds: Normal breath sounds. No wheezing or rales.  Neurological:     Mental Status: He is alert.     Lab Results  Component Value Date   WBC 10.6 12/31/2023   HGB 14.7 12/31/2023   HCT 43.4 12/31/2023   PLT 182 12/31/2023   GLUCOSE 90 12/13/2023   CHOL 95 (L) 08/19/2022   TRIG 68 08/19/2022   HDL 40 08/19/2022   LDLCALC 40 08/19/2022   ALT 59 (H) 12/31/2023   AST 53 (H) 12/31/2023   NA 131 (L) 12/13/2023   K 4.5 12/13/2023   CL 99 12/13/2023   CREATININE 1.16 12/13/2023   BUN 11 12/13/2023   CO2 24 12/13/2023   TSH 2.490 10/18/2023   PSA 0.71 07/03/2014   INR 1.3 (H) 12/06/2023   HGBA1C 5.8 (H) 07/30/2023     Assessment & Plan:  Acute maxillary sinusitis, recurrence not specified Assessment & Plan: Treating with Doxy.   Orders: -     Doxycycline Hyclate; Take 1 tablet (100 mg total) by mouth 2 (two) times daily. Take with food and water.  Dispense: 14 tablet; Refill: 0   Follow-up:  Return if symptoms worsen or fail to improve.  Everlene Other DO [facility]

## 2024-01-04 NOTE — Assessment & Plan Note (Signed)
Treating with Doxy. 

## 2024-01-06 ENCOUNTER — Telehealth: Payer: Self-pay

## 2024-01-06 NOTE — Telephone Encounter (Signed)
 PA done on Cover My Meds for Xifaxan 550mg . Dx used: K76.82. waiting on a response from Cover My Meds.

## 2024-01-06 NOTE — Telephone Encounter (Signed)
 Pt approved for his Xifaxan 550mg . Approved through 10/25/2024 under Medicare Part D. Pt is also aware. Given to Darl Pikes for scan to chart

## 2024-01-10 ENCOUNTER — Other Ambulatory Visit: Payer: Self-pay | Admitting: Family Medicine

## 2024-01-10 ENCOUNTER — Other Ambulatory Visit: Payer: Self-pay

## 2024-01-10 MED ORDER — CLOPIDOGREL BISULFATE 75 MG PO TABS: ORAL_TABLET | ORAL | 0 refills | Status: DC

## 2024-01-18 ENCOUNTER — Other Ambulatory Visit: Payer: Self-pay

## 2024-01-18 ENCOUNTER — Observation Stay (HOSPITAL_COMMUNITY)
Admission: EM | Admit: 2024-01-18 | Discharge: 2024-01-19 | Disposition: A | Discharge status: Home / Self Care | Attending: Family Medicine | Admitting: Family Medicine

## 2024-01-18 ENCOUNTER — Emergency Department (HOSPITAL_COMMUNITY)

## 2024-01-18 ENCOUNTER — Encounter (HOSPITAL_COMMUNITY): Payer: Self-pay | Admitting: Emergency Medicine

## 2024-01-18 DIAGNOSIS — J449 Chronic obstructive pulmonary disease, unspecified: Secondary | ICD-10-CM | POA: Diagnosis present

## 2024-01-18 DIAGNOSIS — Z9861 Coronary angioplasty status: Secondary | ICD-10-CM | POA: Diagnosis not present

## 2024-01-18 DIAGNOSIS — I10 Essential (primary) hypertension: Secondary | ICD-10-CM | POA: Diagnosis not present

## 2024-01-18 DIAGNOSIS — Z79899 Other long term (current) drug therapy: Secondary | ICD-10-CM | POA: Insufficient documentation

## 2024-01-18 DIAGNOSIS — R778 Other specified abnormalities of plasma proteins: Secondary | ICD-10-CM | POA: Insufficient documentation

## 2024-01-18 DIAGNOSIS — K7460 Unspecified cirrhosis of liver: Secondary | ICD-10-CM | POA: Diagnosis present

## 2024-01-18 DIAGNOSIS — K219 Gastro-esophageal reflux disease without esophagitis: Secondary | ICD-10-CM | POA: Diagnosis not present

## 2024-01-18 DIAGNOSIS — E1159 Type 2 diabetes mellitus with other circulatory complications: Secondary | ICD-10-CM | POA: Diagnosis not present

## 2024-01-18 DIAGNOSIS — R002 Palpitations: Principal | ICD-10-CM | POA: Diagnosis present

## 2024-01-18 DIAGNOSIS — Z7902 Long term (current) use of antithrombotics/antiplatelets: Secondary | ICD-10-CM | POA: Diagnosis not present

## 2024-01-18 DIAGNOSIS — I2510 Atherosclerotic heart disease of native coronary artery without angina pectoris: Secondary | ICD-10-CM

## 2024-01-18 DIAGNOSIS — E8809 Other disorders of plasma-protein metabolism, not elsewhere classified: Secondary | ICD-10-CM | POA: Diagnosis not present

## 2024-01-18 DIAGNOSIS — Z8673 Personal history of transient ischemic attack (TIA), and cerebral infarction without residual deficits: Secondary | ICD-10-CM

## 2024-01-18 DIAGNOSIS — R188 Other ascites: Secondary | ICD-10-CM | POA: Diagnosis present

## 2024-01-18 DIAGNOSIS — E46 Unspecified protein-calorie malnutrition: Secondary | ICD-10-CM | POA: Insufficient documentation

## 2024-01-18 DIAGNOSIS — I480 Paroxysmal atrial fibrillation: Secondary | ICD-10-CM | POA: Diagnosis present

## 2024-01-18 DIAGNOSIS — R7989 Other specified abnormal findings of blood chemistry: Secondary | ICD-10-CM | POA: Diagnosis not present

## 2024-01-18 DIAGNOSIS — Z87891 Personal history of nicotine dependence: Secondary | ICD-10-CM | POA: Insufficient documentation

## 2024-01-18 DIAGNOSIS — R55 Syncope and collapse: Secondary | ICD-10-CM | POA: Diagnosis not present

## 2024-01-18 LAB — CBC WITH DIFFERENTIAL/PLATELET
Abs Immature Granulocytes: 0.05 10*3/uL (ref 0.00–0.07)
Basophils Absolute: 0.1 10*3/uL (ref 0.0–0.1)
Basophils Relative: 1 %
Eosinophils Absolute: 0.3 10*3/uL (ref 0.0–0.5)
Eosinophils Relative: 3 %
HCT: 46 % (ref 39.0–52.0)
Hemoglobin: 15.8 g/dL (ref 13.0–17.0)
Immature Granulocytes: 1 %
Lymphocytes Relative: 28 %
Lymphs Abs: 2.6 10*3/uL (ref 0.7–4.0)
MCH: 32 pg (ref 26.0–34.0)
MCHC: 34.3 g/dL (ref 30.0–36.0)
MCV: 93.3 fL (ref 80.0–100.0)
Monocytes Absolute: 1 10*3/uL (ref 0.1–1.0)
Monocytes Relative: 10 %
Neutro Abs: 5.4 10*3/uL (ref 1.7–7.7)
Neutrophils Relative %: 57 %
Platelets: 202 10*3/uL (ref 150–400)
RBC: 4.93 MIL/uL (ref 4.22–5.81)
RDW: 17.5 % — ABNORMAL HIGH (ref 11.5–15.5)
WBC: 9.4 10*3/uL (ref 4.0–10.5)
nRBC: 0 % (ref 0.0–0.2)

## 2024-01-18 LAB — COMPREHENSIVE METABOLIC PANEL
ALT: 42 U/L (ref 0–44)
AST: 58 U/L — ABNORMAL HIGH (ref 15–41)
Albumin: 2.7 g/dL — ABNORMAL LOW (ref 3.5–5.0)
Alkaline Phosphatase: 87 U/L (ref 38–126)
Anion gap: 10 (ref 5–15)
BUN: 9 mg/dL (ref 8–23)
CO2: 21 mmol/L — ABNORMAL LOW (ref 22–32)
Calcium: 9.1 mg/dL (ref 8.9–10.3)
Chloride: 102 mmol/L (ref 98–111)
Creatinine, Ser: 0.89 mg/dL (ref 0.61–1.24)
GFR, Estimated: >60 mL/min (ref >60)
Glucose, Bld: 91 mg/dL (ref 70–99)
Potassium: 3.6 mmol/L (ref 3.5–5.1)
Sodium: 133 mmol/L — ABNORMAL LOW (ref 135–145)
Total Bilirubin: 3 mg/dL — ABNORMAL HIGH (ref 0.0–1.2)
Total Protein: 7.1 g/dL (ref 6.5–8.1)

## 2024-01-18 LAB — TROPONIN I (HIGH SENSITIVITY)
Troponin I (High Sensitivity): 19 ng/L — ABNORMAL HIGH (ref <18)
Troponin I (High Sensitivity): 23 ng/L — ABNORMAL HIGH (ref <18)

## 2024-01-18 NOTE — H&P (Signed)
 History and Physical    Patient: Mitchell Oliver XBJ:[id number] DOB: 1946/03/24 DOA: 01/18/2024 DOS: the patient was seen and examined on 01/19/2024 PCP: [name], DO  Patient coming from: Home  Chief Complaint:  Chief Complaint  Patient presents with   Palpitations   HPI: Mitchell Oliver is a 78 y.o. male with medical history significant of hypertension, COPD, coronary artery disease, CVA, diabetes mellitus, NSTEMI who presents to the emergency department due to palpitations.  He complained of heart rate suddenly increasing to 160s with an associated weakness and sensation of dizziness.  EMS was activated, but by the time EMS arrived, heart rate as decreased to about 110s and EMS stated that the rhythm was similar to atrial fibrillation, however, by the time patient arrived to ED, he already converted to normal sinus rhythm with occasional PACs.  ED Course:  In the emergency department, he was hemodynamically stable.  Workup in the ED showed normal CBC, BMP was normal except for sodium of 133, bicarb 21, albumin 2.7, AST 58.  Troponin 19 > 23. Chest x-ray showed stable diffuse reticular densities Cardiology was consulted and recommended prescribing metoprolol to take when symptoms occur again.  However, since patient lives alone, he did not feel comfortable going home due to near syncopal episode he had at home.  Patient also states that he has an EGD scheduled for Friday (3/28).  Per medical record, daughter spoke with gastroenterologist (Dr. Marletta Lor) and was told that the procedure may be done earlier if patient is hospitalized. Hospitalist was asked to admit patient for further evaluation and management.   Review of Systems: Review of systems as noted in the HPI. All other systems reviewed and are negative.   Past Medical History:  Diagnosis Date   Allergy    Ascites    Cirrhosis (HCC)    COPD (chronic obstructive pulmonary disease) (HCC)    CVA (cerebral vascular accident)  (HCC) 10/26/2010   denies residual on 07/22/2015   ED (erectile dysfunction)    Family history of adverse reaction to anesthesia    "think my mother had real bad headaches after anesthesia"   GERD (gastroesophageal reflux disease)    Hypercholesterolemia    Hypertension    IFG (impaired fasting glucose)    NSTEMI (non-ST elevated myocardial infarction) (HCC)    Pneumonia ~ 2005   Past Surgical History:  Procedure Laterality Date   APPENDECTOMY  2009 [facility]   BACK SURGERY     BIOPSY  05/04/2014   Procedure: BIOPSY;  Surgeon: West Bali, MD;  [facility]: [facility];  Service: Endoscopy;;   CARDIAC CATHETERIZATION N/A 07/18/2015   Procedure: Left Heart Cath and Coronary Angiography;  Surgeon: Lyn Records, MD;  Location: [facility] INVASIVE CV LAB;  Service: Cardiovascular;  Laterality: N/A;   CARDIAC CATHETERIZATION N/A 07/18/2015   Procedure: Coronary Stent Intervention;  Surgeon: Lyn Records, MD;  Location: [facility] INVASIVE CV LAB;  Service: Cardiovascular;  Laterality: N/A;   CARDIAC CATHETERIZATION N/A 07/22/2015   Procedure: Coronary Stent Intervention;  Surgeon: Lyn Records, MD;  Location: [facility] INVASIVE CV LAB;  Service: Cardiovascular;  Laterality: N/A;   COLONOSCOPY  2009   COLONOSCOPY N/A 08/04/2019   Procedure: COLONOSCOPY;  Surgeon: West Bali, MD;  Location: [facility];  Service: Endoscopy;  Laterality: N/A;  12:30   ESOPHAGOGASTRODUODENOSCOPY N/A 05/04/2014   Procedure: ESOPHAGOGASTRODUODENOSCOPY (EGD);  Surgeon: West Bali, MD;  Location: [facility];  Service: Endoscopy;  Laterality: N/A;  9:15   IR PARACENTESIS  12/02/2023   IR TRANSCATHETER BX  12/02/2023   POLYPECTOMY  08/04/2019   Procedure: POLYPECTOMY;  Surgeon: West Bali, MD;  Location: [facility];  Service: Endoscopy;;  hepatic flexure, ascending colon, transverse colon, splenic flexure, descending colon    POSTERIOR LAMINECTOMY / DECOMPRESSION LUMBAR SPINE  06/03/2015   SAVORY DILATION N/A 05/04/2014    Procedure: SAVORY DILATION;  Surgeon: West Bali, MD;  Location: [facility];  Service: Endoscopy;  Laterality: N/A;    Social History:  reports that he quit smoking about 38 years ago. His smoking use included cigarettes. He started smoking about 62 years ago. He has a 48.3 pack-year smoking history. He has never used smokeless tobacco. He reports current alcohol use of about 6.0 standard drinks of alcohol per week. He reports that he does not use drugs.   Allergies  Allergen Reactions   Bee Venom Anaphylaxis   Shellfish Allergy Anaphylaxis    Scallops    Lidocaine Hives and Rash    Lidocaine Patch (rash) - only around the area where the patch was applied.    Family History  Problem Relation Age of Onset   Hypertension Mother    Hypertension Father    Heart attack Father    Stroke Father    Colon cancer Neg Hx    Colon polyps Neg Hx       Prior to Admission medications   Medication Sig Start Date End Date Taking? Authorizing Provider  albuterol (VENTOLIN HFA) 108 (90 Base) MCG/ACT inhaler INHALE 2 PUFFS INTO THE LUNGS EVERY 6 HOURS AS NEEDED FOR WHEEZING OR SHORTNESS OF BREATH Patient taking differently: Inhale 2 puffs into the lungs daily. 07/13/23   [name], DO  amLODipine (NORVASC) 2.5 MG tablet TAKE 1 TABLET(2.5 MG) BY MOUTH DAILY 09/07/23   Nahser, Deloris Ping, MD  benazepril (LOTENSIN) 20 MG tablet TAKE 1 TABLET(20 MG) BY MOUTH DAILY 11/10/23   Everlene Other G, DO  clopidogrel (PLAVIX) 75 MG tablet TAKE 1 TABLET(75 MG) BY MOUTH DAILY 01/10/24   Cook, Jayce G, DO  dorzolamide-timolol (COSOPT) 22.3-6.8 MG/ML ophthalmic solution Place 1 drop into both eyes 2 (two) times daily. 03/28/21   [provider]  doxycycline (VIBRA-TABS) 100 MG tablet Take 1 tablet (100 mg total) by mouth 2 (two) times daily. Take with food and water. 01/04/24   [name], DO  furosemide (LASIX) 40 MG tablet Take 1 tablet (40 mg total) by mouth daily. 12/01/23 11/30/24  Lanelle Bal, DO   ipratropium (ATROVENT) 0.06 % nasal spray Place 2 sprays into both nostrils 4 (four) times daily. 12/06/23   [name], DO  Iron, Ferrous Sulfate, 325 (65 Fe) MG TABS Take 325 mg by mouth every other day. 08/25/22   [name], DO  JARDIANCE 10 MG TABS tablet TAKE 1 TABLET(10 MG) BY MOUTH DAILY 12/09/23   Everlene Other G, DO  lactulose (CHRONULAC) 10 GM/15ML solution Take 30 mLs by mouth 3 (three) times daily. 12/10/23 12/09/24  [provider]  latanoprost (XALATAN) 0.005 % ophthalmic solution Place 1 drop into both eyes at bedtime. 08/21/23   [provider]  ondansetron (ZOFRAN) 4 MG tablet Take 1 tablet (4 mg total) by mouth every 6 (six) hours as needed for nausea. 10/07/23   Sherryll Burger, Pratik D, DO  pantoprazole (PROTONIX) 40 MG tablet Take 40 mg by mouth daily.    [provider]  polyethylene glycol (MIRALAX /  GLYCOLAX) 17 g packet Take 17 g by mouth daily as needed for mild constipation. 10/07/23   Sherryll Burger, Pratik D, DO  rifaximin (XIFAXAN) 550 MG TABS tablet Take 1 tablet (550 mg total) by mouth 2 (two) times daily. 12/30/23 12/29/24  Lanelle Bal, DO  spironolactone (ALDACTONE) 100 MG tablet Take 1 tablet (100 mg total) by mouth daily. 12/01/23 11/30/24  Lanelle Bal, DO  tiZANidine (ZANAFLEX) 2 MG tablet Take 2 mg by mouth 2 (two) times daily as needed. 09/20/23   [provider]    Physical Exam: BP (!) 156/92   Pulse 83   Temp 97.8 F (36.6 C)   Resp 18   Ht 6\' 2"  (1.88 m)   Wt 94.3 kg   SpO2 98%   BMI 26.69 kg/m   General: 78 y.o. year-old male well developed well nourished in no acute distress.  Alert and oriented x3. HEENT: NCAT, EOMI Neck: Supple, trachea medial Cardiovascular: Regular rate and rhythm with no rubs or gallops.  No thyromegaly or JVD noted.  No lower extremity edema. 2/4 pulses in all 4 extremities. Respiratory: Clear to auscultation with no wheezes or rales. Good inspiratory effort. Abdomen: Soft, nontender nondistended  with normal bowel sounds x4 quadrants. Muskuloskeletal: No cyanosis, clubbing or edema noted bilaterally Neuro: CN II-XII intact, strength 5/5 x 4, sensation, reflexes intact Skin: No ulcerative lesions noted or rashes Psychiatry: Judgement and insight appear normal. Mood is appropriate for condition and setting          Labs on Admission:  Basic Metabolic Panel: Recent Labs  Lab 01/18/24 1940  NA 133*  K 3.6  CL 102  CO2 21*  GLUCOSE 91  BUN 9  CREATININE 0.89  CALCIUM 9.1   Liver Function Tests: Recent Labs  Lab 01/18/24 1940  AST 58*  ALT 42  ALKPHOS 87  BILITOT 3.0*  PROT 7.1  ALBUMIN 2.7*   No results for input(s): "LIPASE", "AMYLASE" in the last 168 hours. No results for input(s): "AMMONIA" in the last 168 hours. CBC: Recent Labs  Lab 01/18/24 1754  WBC 9.4  NEUTROABS 5.4  HGB 15.8  HCT 46.0  MCV 93.3  PLT 202   Cardiac Enzymes: No results for input(s): "CKTOTAL", "CKMB", "CKMBINDEX", "TROPONINI" in the last 168 hours.  BNP (last 3 results) Recent Labs    10/04/23 1851  BNP 1,308.0*    ProBNP (last 3 results) No results for input(s): "PROBNP" in the last 8760 hours.  CBG: No results for input(s): "GLUCAP" in the last 168 hours.  Radiological Exams on Admission: DG Chest Port 1 View Result Date: 01/18/2024 CLINICAL DATA:  Weakness, palpitations. EXAM: PORTABLE CHEST 1 VIEW COMPARISON:  October 05, 2023. FINDINGS: The heart size and mediastinal contours are within normal limits. Stable diffuse reticular densities are noted throughout both lungs which may represent chronic interstitial lung disease, although acute superimposed edema or atypical inflation cannot be excluded. The visualized skeletal structures are unremarkable. IMPRESSION: Stable diffuse reticular densities are noted as noted above. Electronically Signed   By: Lupita Raider M.D.   On: 01/18/2024 18:22    EKG: I independently viewed the EKG done and my findings are as followed:  Normal sinus rhythm at a rate of 72 bpm with APCs  Assessment/Plan Present on Admission:  Heart palpitations  Essential hypertension, benign  COPD (chronic obstructive pulmonary disease) (HCC)  Type 2 diabetes mellitus with other circulatory complications (HCC)  Principal Problem:   Heart palpitations Active Problems:  CAD S/P percutaneous coronary angioplasty   Essential hypertension, benign   Type 2 diabetes mellitus with other circulatory complications (HCC)   COPD (chronic obstructive pulmonary disease) (HCC)   Near syncope   Elevated troponin   Hypoalbuminemia due to protein-calorie malnutrition (HCC)  Heart palpitations Near syncope Continue telemetry and watch for arrhythmias Troponins 19 > 23 EKG showed normal sinus rhythm at a rate of 72 bpm with APCs Echocardiogram done on 09/22/2023 showed normal LV systolic function with EF 55 to 60%.  Normal diastolic function.  Mild MR Echocardiogram will be done to rule out significant aortic stenosis or other outflow obstruction, and also to evaluate EF and to rule out segmental/Regional wall motion abnormalities.  Cardiology consulted and we shall await further recommendations  Elevated troponin possibly secondary to type II demand ischemia Troponin 19 > 23; patient denies chest pain  Hypoalbuminemia Albumin 2.7, protein supplement will be provided  CAD S/P percutaneous coronary angioplasty PCI with DES 2016.   Continue Plavix    COPD  Continue albuterol as needed per minute  Type 2 diabetes mellitus  Controlled.  Hemoglobin A1c A1c 5.8 (07/30/23).  Tight control. Diet and lifestyle modification  Essential hypertension Continue amlodipine and hold benazepril   GERD Continue Protonix   DVT prophylaxis: Lovenox  Code Status: Full code  Family Communication: None at bedside  Consults: Cardiology  Severity of Illness: The appropriate patient status for this patient is OBSERVATION. Observation status is judged  to be reasonable and necessary in order to provide the required intensity of service to ensure the patient's safety. The patient's presenting symptoms, physical exam findings, and initial radiographic and laboratory data in the context of their medical condition is felt to place them at decreased risk for further clinical deterioration. Furthermore, it is anticipated that the patient will be medically stable for discharge from the hospital within 2 midnights of admission.   Author: Frankey Shown, DO 01/19/2024 3:32 AM  For on call review [email or web address].

## 2024-01-18 NOTE — ED Triage Notes (Signed)
 Pt called ems due to heart racing. Pt has no other complaints.

## 2024-01-18 NOTE — ED Provider Notes (Signed)
 [place] EMERGENCY DEPARTMENT AT [facility] Provider Note   CSN: [id number] Arrival date & time: 01/18/24  1646     History {Add pertinent medical, surgical, social history, OB history to HPI:1} Chief Complaint  Patient presents with   Palpitations    Mitchell Oliver is a 78 y.o. male.  Patient has a 3 of cirrhosis coronary artery disease with NSTEMI.  He has a stent in place.  He also has had a CVA.  He is supposed to get upper endoscopy for varices done on Friday and has not been taking his antiplatelet medicine because of that.  Patient states that for about 20 to 30 minutes he was feeling dizzy and weak and according to his pulse ox and pulse monitor his heart rate was 160s.  When paramedics arrived his heart rate did slow down to more like 110 and they stated that look like he was in atrial fibs.  When patient arrived here he was not tachycardic and was in a normal sinus rhythm with occasional PACs.  The history is provided by the patient and medical records. No language interpreter was used.  Palpitations Palpitations quality:  Unable to specify Onset quality:  Sudden Duration:  40 minutes Timing:  Intermittent Progression:  Resolved Chronicity:  New Context: anxiety   Relieved by:  Nothing Worsened by:  Nothing Ineffective treatments:  None tried Associated symptoms: no back pain, no chest pain and no cough        Home Medications Prior to Admission medications   Medication Sig Start Date End Date Taking? Authorizing Provider  albuterol (VENTOLIN HFA) 108 (90 Base) MCG/ACT inhaler INHALE 2 PUFFS INTO THE LUNGS EVERY 6 HOURS AS NEEDED FOR WHEEZING OR SHORTNESS OF BREATH Patient taking differently: Inhale 2 puffs into the lungs daily. 07/13/23   [name], DO  amLODipine (NORVASC) 2.5 MG tablet TAKE 1 TABLET(2.5 MG) BY MOUTH DAILY 09/07/23   Nahser, Deloris Ping, MD  benazepril (LOTENSIN) 20 MG tablet TAKE 1 TABLET(20 MG) BY MOUTH DAILY 11/10/23   Everlene Other G, DO  clopidogrel (PLAVIX) 75 MG tablet TAKE 1 TABLET(75 MG) BY MOUTH DAILY 01/10/24   Cook, Jayce G, DO  dorzolamide-timolol (COSOPT) 22.3-6.8 MG/ML ophthalmic solution Place 1 drop into both eyes 2 (two) times daily. 03/28/21   [provider]  doxycycline (VIBRA-TABS) 100 MG tablet Take 1 tablet (100 mg total) by mouth 2 (two) times daily. Take with food and water. 01/04/24   [name], DO  furosemide (LASIX) 40 MG tablet Take 1 tablet (40 mg total) by mouth daily. 12/01/23 11/30/24  Lanelle Bal, DO  ipratropium (ATROVENT) 0.06 % nasal spray Place 2 sprays into both nostrils 4 (four) times daily. 12/06/23   [name], DO  Iron, Ferrous Sulfate, 325 (65 Fe) MG TABS Take 325 mg by mouth every other day. 08/25/22   [name], DO  JARDIANCE 10 MG TABS tablet TAKE 1 TABLET(10 MG) BY MOUTH DAILY 12/09/23   Everlene Other G, DO  lactulose (CHRONULAC) 10 GM/15ML solution Take 30 mLs by mouth 3 (three) times daily. 12/10/23 12/09/24  [provider]  latanoprost (XALATAN) 0.005 % ophthalmic solution Place 1 drop into both eyes at bedtime. 08/21/23   [provider]  ondansetron (ZOFRAN) 4 MG tablet Take 1 tablet (4 mg total) by mouth every 6 (six) hours as needed for nausea. 10/07/23   Sherryll Burger, Pratik D, DO  pantoprazole (PROTONIX) 40 MG tablet Take 40 mg  by mouth daily.    [provider]  polyethylene glycol (MIRALAX / GLYCOLAX) 17 g packet Take 17 g by mouth daily as needed for mild constipation. 10/07/23   Sherryll Burger, Pratik D, DO  rifaximin (XIFAXAN) 550 MG TABS tablet Take 1 tablet (550 mg total) by mouth 2 (two) times daily. 12/30/23 12/29/24  Lanelle Bal, DO  spironolactone (ALDACTONE) 100 MG tablet Take 1 tablet (100 mg total) by mouth daily. 12/01/23 11/30/24  Lanelle Bal, DO  tiZANidine (ZANAFLEX) 2 MG tablet Take 2 mg by mouth 2 (two) times daily as needed. 09/20/23   [provider]      Allergies    Bee venom, Shellfish allergy, and  Lidocaine    Review of Systems   Review of Systems  Constitutional:  Negative for appetite change and fatigue.  HENT:  Negative for congestion, ear discharge and sinus pressure.   Eyes:  Negative for discharge.  Respiratory:  Negative for cough.   Cardiovascular:  Positive for palpitations. Negative for chest pain.  Gastrointestinal:  Negative for abdominal pain and diarrhea.  Genitourinary:  Negative for frequency and hematuria.  Musculoskeletal:  Negative for back pain.  Skin:  Negative for rash.  Neurological:  Negative for seizures and headaches.  Psychiatric/Behavioral:  Negative for hallucinations.     Physical Exam Updated Vital Signs BP 127/84   Pulse 72   Temp 97.7 F (36.5 C)   Resp 18   Ht 6\' 2"  (1.88 m)   Wt 94.3 kg   SpO2 100%   BMI 26.69 kg/m  Physical Exam Vitals and nursing note reviewed.  Constitutional:      Appearance: He is well-developed.  HENT:     Head: Normocephalic.     Nose: Nose normal.  Eyes:     General: No scleral icterus.    Conjunctiva/sclera: Conjunctivae normal.  Neck:     Thyroid: No thyromegaly.  Cardiovascular:     Rate and Rhythm: Normal rate. Rhythm irregular.     Heart sounds: No murmur heard.    No friction rub. No gallop.  Pulmonary:     Breath sounds: No stridor. No wheezing or rales.  Chest:     Chest wall: No tenderness.  Abdominal:     General: There is no distension.     Tenderness: There is no abdominal tenderness. There is no rebound.  Musculoskeletal:        General: Normal range of motion.     Cervical back: Neck supple.  Lymphadenopathy:     Cervical: No cervical adenopathy.  Skin:    Findings: No erythema or rash.  Neurological:     Mental Status: He is alert and oriented to person, place, and time.     Motor: No abnormal muscle tone.     Coordination: Coordination normal.  Psychiatric:        Behavior: Behavior normal.     ED Results / Procedures / Treatments   Labs (all labs ordered are  listed, but only abnormal results are displayed) Labs Reviewed  CBC WITH DIFFERENTIAL/PLATELET - Abnormal; Notable for the following components:      Result Value   RDW 17.5 (*)    All other components within normal limits  COMPREHENSIVE METABOLIC PANEL - Abnormal; Notable for the following components:   Sodium 133 (*)    CO2 21 (*)    Albumin 2.7 (*)    AST 58 (*)    Total Bilirubin 3.0 (*)    All other  components within normal limits  TROPONIN I (HIGH SENSITIVITY) - Abnormal; Notable for the following components:   Troponin I (High Sensitivity) 19 (*)    All other components within normal limits  TROPONIN I (HIGH SENSITIVITY) - Abnormal; Notable for the following components:   Troponin I (High Sensitivity) 23 (*)    All other components within normal limits    EKG EKG Interpretation Date/Time:  Tuesday January 18 2024 18:44:55 EDT Ventricular Rate:  72 PR Interval:  194 QRS Duration:  90 QT Interval:  404 QTC Calculation: 443 R Axis:   27  Text Interpretation: Sinus rhythm Atrial premature complex Confirmed by Bethann Berkshire [phone]) on 01/18/2024 7:41:06 PM  Radiology DG Chest Port 1 View Result Date: 01/18/2024 CLINICAL DATA:  Weakness, palpitations. EXAM: PORTABLE CHEST 1 VIEW COMPARISON:  October 05, 2023. FINDINGS: The heart size and mediastinal contours are within normal limits. Stable diffuse reticular densities are noted throughout both lungs which may represent chronic interstitial lung disease, although acute superimposed edema or atypical inflation cannot be excluded. The visualized skeletal structures are unremarkable. IMPRESSION: Stable diffuse reticular densities are noted as noted above. Electronically Signed   By: Lupita Raider M.D.   On: 01/18/2024 18:22    Procedures Procedures  {Document cardiac monitor, telemetry assessment procedure when appropriate:1}  Medications Ordered in ED Medications - No data to display  ED Course/ Medical Decision Making/  A&P   {I spoke to cardiology and they recommended sending the patient home with a prescription of metoprolol to take when this happens again.  I spoke with the patient and he lives by himself and he was not comfortable with going home since he almost had a syncopal episode.  We will admit him for observation by the hospitalist.  Patient also states that he is post to have upper endoscopy done on Friday with preop labs done tomorrow.  His daughter spoke with Dr. Marletta Lor the gastroenterologist and Dr. Marletta Lor told them if he was admitted that they may do the endoscopy while he is in the hospital. Click here for ABCD2, HEART and other calculatorsREFRESH Note before signing :1}                              Medical Decision Making Amount and/or Complexity of Data Reviewed Labs: ordered. Radiology: ordered.  Risk Decision regarding hospitalization.   Patient with palpitations and possible rapid atrial fibs with near syncopal episode.  He will be admitted to medicine for observation and possibly see cardiology and GI  {Document critical care time when appropriate:1} {Document review of labs and clinical decision tools ie heart score, Chads2Vasc2 etc:1}  {Document your independent review of radiology images, and any outside records:1} {Document your discussion with family members, caretakers, and with consultants:1} {Document social determinants of health affecting pt's care:1} {Document your decision making why or why not admission, treatments were needed:1} Final Clinical Impression(s) / ED Diagnoses Final diagnoses:  Palpitations    Rx / DC Orders ED Discharge Orders     None

## 2024-01-19 ENCOUNTER — Telehealth: Payer: Self-pay

## 2024-01-19 ENCOUNTER — Encounter (HOSPITAL_COMMUNITY)
Admission: RE | Admit: 2024-01-19 | Discharge: 2024-01-19 | Disposition: A | Discharge status: Home / Self Care | Source: Ambulatory Visit | Attending: Internal Medicine | Admitting: Internal Medicine

## 2024-01-19 ENCOUNTER — Other Ambulatory Visit (HOSPITAL_COMMUNITY): Payer: Self-pay | Admitting: *Deleted

## 2024-01-19 ENCOUNTER — Observation Stay (HOSPITAL_BASED_OUTPATIENT_CLINIC_OR_DEPARTMENT_OTHER)

## 2024-01-19 ENCOUNTER — Observation Stay

## 2024-01-19 DIAGNOSIS — R55 Syncope and collapse: Secondary | ICD-10-CM | POA: Diagnosis not present

## 2024-01-19 DIAGNOSIS — R7989 Other specified abnormal findings of blood chemistry: Secondary | ICD-10-CM | POA: Diagnosis present

## 2024-01-19 DIAGNOSIS — R002 Palpitations: Secondary | ICD-10-CM | POA: Diagnosis not present

## 2024-01-19 DIAGNOSIS — I4891 Unspecified atrial fibrillation: Secondary | ICD-10-CM

## 2024-01-19 DIAGNOSIS — Z7902 Long term (current) use of antithrombotics/antiplatelets: Secondary | ICD-10-CM

## 2024-01-19 DIAGNOSIS — Z8673 Personal history of transient ischemic attack (TIA), and cerebral infarction without residual deficits: Secondary | ICD-10-CM

## 2024-01-19 DIAGNOSIS — K7460 Unspecified cirrhosis of liver: Secondary | ICD-10-CM

## 2024-01-19 DIAGNOSIS — I2510 Atherosclerotic heart disease of native coronary artery without angina pectoris: Secondary | ICD-10-CM | POA: Diagnosis not present

## 2024-01-19 DIAGNOSIS — E8809 Other disorders of plasma-protein metabolism, not elsewhere classified: Secondary | ICD-10-CM | POA: Diagnosis present

## 2024-01-19 DIAGNOSIS — I480 Paroxysmal atrial fibrillation: Secondary | ICD-10-CM | POA: Diagnosis present

## 2024-01-19 DIAGNOSIS — I10 Essential (primary) hypertension: Secondary | ICD-10-CM | POA: Diagnosis not present

## 2024-01-19 LAB — ECHOCARDIOGRAM LIMITED
AR max vel: 2.77 cm2
AV Area VTI: 3.65 cm2
AV Area mean vel: 2.94 cm2
AV Mean grad: 9 mmHg
AV Peak grad: 17.8 mmHg
Ao pk vel: 2.11 m/s
Area-P 1/2: 2.36 cm2
Calc EF: 64.2 %
Height: 74 in
S' Lateral: 3.1 cm
Single Plane A2C EF: 57.2 %
Single Plane A4C EF: 69.2 %
Weight: 3336.88 [oz_av]

## 2024-01-19 LAB — COMPREHENSIVE METABOLIC PANEL
ALT: 39 U/L (ref 0–44)
AST: 55 U/L — ABNORMAL HIGH (ref 15–41)
Albumin: 2.5 g/dL — ABNORMAL LOW (ref 3.5–5.0)
Alkaline Phosphatase: 70 U/L (ref 38–126)
Anion gap: 7 (ref 5–15)
BUN: 10 mg/dL (ref 8–23)
CO2: 24 mmol/L (ref 22–32)
Calcium: 9.1 mg/dL (ref 8.9–10.3)
Chloride: 103 mmol/L (ref 98–111)
Creatinine, Ser: 1 mg/dL (ref 0.61–1.24)
GFR, Estimated: >60 mL/min (ref >60)
Glucose, Bld: 148 mg/dL — ABNORMAL HIGH (ref 70–99)
Potassium: 3.8 mmol/L (ref 3.5–5.1)
Sodium: 134 mmol/L — ABNORMAL LOW (ref 135–145)
Total Bilirubin: 3.6 mg/dL — ABNORMAL HIGH (ref 0.0–1.2)
Total Protein: 6.5 g/dL (ref 6.5–8.1)

## 2024-01-19 LAB — CBC
HCT: 42.8 % (ref 39.0–52.0)
Hemoglobin: 14.4 g/dL (ref 13.0–17.0)
MCH: 31.8 pg (ref 26.0–34.0)
MCHC: 33.6 g/dL (ref 30.0–36.0)
MCV: 94.5 fL (ref 80.0–100.0)
Platelets: 151 10*3/uL (ref 150–400)
RBC: 4.53 MIL/uL (ref 4.22–5.81)
RDW: 17.2 % — ABNORMAL HIGH (ref 11.5–15.5)
WBC: 9 10*3/uL (ref 4.0–10.5)
nRBC: 0 % (ref 0.0–0.2)

## 2024-01-19 LAB — TSH: TSH: 2.268 u[IU]/mL (ref 0.350–4.500)

## 2024-01-19 LAB — PHOSPHORUS: Phosphorus: 3.5 mg/dL (ref 2.5–4.6)

## 2024-01-19 LAB — MAGNESIUM: Magnesium: 1.6 mg/dL — ABNORMAL LOW (ref 1.7–2.4)

## 2024-01-19 MED ORDER — PANTOPRAZOLE SODIUM 40 MG PO TBEC: 40.0000 mg | DELAYED_RELEASE_TABLET | Freq: Every day | ORAL | 3 refills | Status: DC

## 2024-01-19 MED ORDER — PANTOPRAZOLE SODIUM 40 MG PO TBEC
40.0000 mg | DELAYED_RELEASE_TABLET | Freq: Every day | ORAL | Status: DC
  Administered 2024-01-19: 40 mg via ORAL
  Filled 2024-01-19: qty 1

## 2024-01-19 MED ORDER — AMLODIPINE BESYLATE 5 MG PO TABS
2.5000 mg | ORAL_TABLET | Freq: Every day | ORAL | Status: DC
  Administered 2024-01-19: 2.5 mg via ORAL
  Filled 2024-01-19: qty 1

## 2024-01-19 MED ORDER — CARVEDILOL 3.125 MG PO TABS: 3.1250 mg | ORAL_TABLET | Freq: Two times a day (BID) | ORAL | 5 refills | Status: DC

## 2024-01-19 MED ORDER — MAGNESIUM SULFATE 2 GM/50ML IV SOLN
2.0000 g | Freq: Once | INTRAVENOUS | Status: AC
  Administered 2024-01-19: 2 g via INTRAVENOUS
  Filled 2024-01-19: qty 50

## 2024-01-19 MED ORDER — ACETAMINOPHEN 325 MG PO TABS: 650.0000 mg | ORAL_TABLET | Freq: Four times a day (QID) | ORAL | Status: DC | PRN

## 2024-01-19 MED ORDER — ENSURE ENLIVE PO LIQD
237.0000 mL | Freq: Two times a day (BID) | ORAL | Status: DC
  Administered 2024-01-19 (×2): 237 mL via ORAL

## 2024-01-19 MED ORDER — RIFAXIMIN 550 MG PO TABS: 550.0000 mg | ORAL_TABLET | Freq: Two times a day (BID) | ORAL | 11 refills | Status: DC

## 2024-01-19 MED ORDER — LACTULOSE 10 GM/15ML PO SOLN: 20.0000 g | Freq: Two times a day (BID) | ORAL | 3 refills | Status: DC

## 2024-01-19 MED ORDER — CARVEDILOL 3.125 MG PO TABS
3.1250 mg | ORAL_TABLET | Freq: Two times a day (BID) | ORAL | Status: DC
Start: 2024-01-19 — End: 2024-01-19

## 2024-01-19 MED ORDER — ALBUTEROL SULFATE HFA 108 (90 BASE) MCG/ACT IN AERS: 2.0000 | INHALATION_SPRAY | Freq: Four times a day (QID) | RESPIRATORY_TRACT | Status: DC | PRN

## 2024-01-19 MED ORDER — BENAZEPRIL HCL 20 MG PO TABS
20.0000 mg | ORAL_TABLET | Freq: Every day | ORAL | Status: DC
  Administered 2024-01-19: 20 mg via ORAL
  Filled 2024-01-19: qty 1

## 2024-01-19 MED ORDER — ONDANSETRON HCL 4 MG/2ML IJ SOLN: 4.0000 mg | Freq: Four times a day (QID) | INTRAMUSCULAR | Status: DC | PRN

## 2024-01-19 MED ORDER — FUROSEMIDE 40 MG PO TABS: 40.0000 mg | ORAL_TABLET | Freq: Every day | ORAL | 11 refills | Status: DC

## 2024-01-19 MED ORDER — ACETAMINOPHEN 650 MG RE SUPP: 650.0000 mg | Freq: Four times a day (QID) | RECTAL | Status: DC | PRN

## 2024-01-19 MED ORDER — ONDANSETRON HCL 4 MG PO TABS: 4.0000 mg | ORAL_TABLET | Freq: Four times a day (QID) | ORAL | Status: DC | PRN

## 2024-01-19 MED ORDER — ENOXAPARIN SODIUM 40 MG/0.4ML IJ SOSY
40.0000 mg | PREFILLED_SYRINGE | INTRAMUSCULAR | Status: DC
  Filled 2024-01-19: qty 0.4

## 2024-01-19 MED ORDER — ALBUTEROL SULFATE (2.5 MG/3ML) 0.083% IN NEBU: 2.5000 mg | INHALATION_SOLUTION | Freq: Four times a day (QID) | RESPIRATORY_TRACT | Status: DC | PRN

## 2024-01-19 MED ORDER — IRON (FERROUS SULFATE) 325 (65 FE) MG PO TABS: 325.0000 mg | ORAL_TABLET | ORAL | 1 refills | Status: DC

## 2024-01-19 MED ORDER — CLOPIDOGREL BISULFATE 75 MG PO TABS
75.0000 mg | ORAL_TABLET | Freq: Every day | ORAL | Status: DC
  Filled 2024-01-19: qty 1

## 2024-01-19 NOTE — Progress Notes (Signed)
  Echocardiogram 2D Echocardiogram has been performed.  Ocie Doyne RDCS 01/19/2024, 2:53 PM

## 2024-01-19 NOTE — Discharge Instructions (Signed)
 1)Very Low-salt diet advised---Less than 2 gm of Sodium per day advised----ok to use Mrs DASH salt substitute instead of Salt 2)Weigh yourself daily, call if you gain more than 3 pounds in 1 day or more than 5 pounds in 1 week as your diuretic medications may need to be adjusted 3)Limit your Fluid  intake to no more than 60 ounces (1.8 Liters) per day 4)Avoid ibuprofen/Advil/Aleve/Motrin/Goody Powders/Naproxen/BC powders/Meloxicam/Diclofenac/Indomethacin and other Nonsteroidal anti-inflammatory medications as these will make you more likely to bleed and can cause stomach ulcers, can also cause Kidney problems.  5)Follow-up Gastroenterologist Dr. Earnest Bailey with [facility]---on Friday 01/21/24 for EGD procedure as previously discussed -address: [address], [place], [place] [address], Phone: [phone] 6)You will get Zio patch/heart monitor device at home with instructions on how to wear it for 2 weeks--to look for irregular heartbeat including atrial fibrillation 7)Please continue to hold Plavix/clopidogrel--to allow for your EGD/upper endoscopy procedure on 01/21/2024 --- If you are subsequently started on a blood thinner like Coumadin or Eliquis after your EGD procedure you will need to stop your Plavix/clopidogrel altogether

## 2024-01-19 NOTE — Consult Note (Addendum)
 Cardiology Consultation   Patient ID: Mitchell Oliver MRN: [id number]; DOB: 1946-09-12  Admit date: 01/18/2024 Date of Consult: 01/19/2024  PCP:  [name], DO   [facility] Providers Cardiologist:  Kristeen Miss, MD   {   Patient Profile:   Mitchell Oliver is a 78 y.o. male with a hx of CAD (s/p NSTEMI in 2016 with DES to PDA and staged PCI to Diagonal), HTN, HLD, GERD, cirrhosis and emphysema who is being seen 01/19/2024 for the evaluation of palpitations at the request of Dr. Thomes Dinning.  History of Present Illness:   Mr. Oats presented to [facility] ED on 01/18/2024 for evaluation of worsening palpitations. EMS had been called out as he noted his heart rate was in the 160's at home and had improved into the 110's by EMS arrival. By EMS report, he was initially in atrial fibrillation but upon arrival to the ED, he had converted back to normal sinus rhythm with occasional PAC's. In talking with the patient today, he reports being in his normal state of health until yesterday afternoon when he developed acute palpitations. He checked his heart rate at home and noticed it was in the 160's and persist, therefore he called EMS. He denies any associated lightheadedness, dizziness or presyncope when asked about these today. No recent chest pain or dyspnea on exertion. Says that he walks around stores for exercise and also makes laps around his house and denies any recent anginal symptoms with this. No significant caffeine or alcohol intake. Says that he has been holding Plavix for upcoming EGD on Friday.  Initial labs showed WBC 9.4, Hgb 15.8, platelets 202, Na+ 133, K+ 3.6 and creatinine 0.89. AST 58 and ALT 42.  Magnesium low at 1.6. TSH not yet obtained and will add onto AM labs. CXR showed stable diffuse reticular densities but no acute abnormalities. EKG showed normal sinus rhythm, heart rate 97 with inferior infarct pattern. No acute ST changes. He has been monitored on  telemetry overnight and predominantly in normal sinus rhythm. Noted to have frequent PAC's and brief episodes of tachycardia with HR into the 120's. Difficult to discern P waves at times.   Past Medical History:  Diagnosis Date   Allergy    Ascites    Cirrhosis (HCC)    COPD (chronic obstructive pulmonary disease) (HCC)    CVA (cerebral vascular accident) (HCC) 10/26/2010   denies residual on 07/22/2015   ED (erectile dysfunction)    Family history of adverse reaction to anesthesia    "think my mother had real bad headaches after anesthesia"   GERD (gastroesophageal reflux disease)    Hypercholesterolemia    Hypertension    IFG (impaired fasting glucose)    NSTEMI (non-ST elevated myocardial infarction) (HCC)    Pneumonia ~ 2005    Past Surgical History:  Procedure Laterality Date   APPENDECTOMY  2009 [facility]   BACK SURGERY     BIOPSY  05/04/2014   Procedure: BIOPSY;  Surgeon: West Bali, MD;  [facility];  Service: Endoscopy;;   CARDIAC CATHETERIZATION N/A 07/18/2015   Procedure: Left Heart Cath and Coronary Angiography;  Surgeon: Lyn Records, MD;  Location: [facility] INVASIVE CV LAB;  Service: Cardiovascular;  Laterality: N/A;   CARDIAC CATHETERIZATION N/A 07/18/2015   Procedure: Coronary Stent Intervention;  Surgeon: Lyn Records, MD;  Location: [facility] INVASIVE CV LAB;  Service: Cardiovascular;  Laterality: N/A;   CARDIAC CATHETERIZATION N/A 07/22/2015   Procedure: Coronary Stent  Intervention;  Surgeon: Lyn Records, MD;  Location: [facility] INVASIVE CV LAB;  Service: Cardiovascular;  Laterality: N/A;   COLONOSCOPY  2009   COLONOSCOPY N/A 08/04/2019   Procedure: COLONOSCOPY;  Surgeon: West Bali, MD;  [facility];  Service: Endoscopy;  Laterality: N/A;  12:30   ESOPHAGOGASTRODUODENOSCOPY N/A 05/04/2014   Procedure: ESOPHAGOGASTRODUODENOSCOPY (EGD);  Surgeon: West Bali, MD;  [facility];  Service: Endoscopy;  Laterality: N/A;  9:15   IR PARACENTESIS   12/02/2023   IR TRANSCATHETER BX  12/02/2023   POLYPECTOMY  08/04/2019   Procedure: POLYPECTOMY;  Surgeon: West Bali, MD;  [facility];  Service: Endoscopy;;  hepatic flexure, ascending colon, transverse colon, splenic flexure, descending colon    POSTERIOR LAMINECTOMY / DECOMPRESSION LUMBAR SPINE  06/03/2015   SAVORY DILATION N/A 05/04/2014   Procedure: SAVORY DILATION;  Surgeon: West Bali, MD;  [facility]: [facility];  Service: Endoscopy;  Laterality: N/A;     Home Medications:  Prior to Admission medications   Medication Sig Start Date End Date Taking? Authorizing Provider  albuterol (VENTOLIN HFA) 108 (90 Base) MCG/ACT inhaler INHALE 2 PUFFS INTO THE LUNGS EVERY 6 HOURS AS NEEDED FOR WHEEZING OR SHORTNESS OF BREATH Patient taking differently: Inhale 2 puffs into the lungs daily. 07/13/23   [name], DO  amLODipine (NORVASC) 2.5 MG tablet TAKE 1 TABLET(2.5 MG) BY MOUTH DAILY 09/07/23   Nahser, Deloris Ping, MD  benazepril (LOTENSIN) 20 MG tablet TAKE 1 TABLET(20 MG) BY MOUTH DAILY 11/10/23   Everlene Other G, DO  clopidogrel (PLAVIX) 75 MG tablet TAKE 1 TABLET(75 MG) BY MOUTH DAILY 01/10/24   Cook, Jayce G, DO  dorzolamide-timolol (COSOPT) 22.3-6.8 MG/ML ophthalmic solution Place 1 drop into both eyes 2 (two) times daily. 03/28/21   [provider]  doxycycline (VIBRA-TABS) 100 MG tablet Take 1 tablet (100 mg total) by mouth 2 (two) times daily. Take with food and water. 01/04/24   [name], DO  furosemide (LASIX) 40 MG tablet Take 1 tablet (40 mg total) by mouth daily. 12/01/23 11/30/24  Lanelle Bal, DO  ipratropium (ATROVENT) 0.06 % nasal spray Place 2 sprays into both nostrils 4 (four) times daily. 12/06/23   [name], DO  Iron, Ferrous Sulfate, 325 (65 Fe) MG TABS Take 325 mg by mouth every other day. 08/25/22   [name], DO  JARDIANCE 10 MG TABS tablet TAKE 1 TABLET(10 MG) BY MOUTH DAILY 12/09/23   Everlene Other G, DO  lactulose (CHRONULAC) 10 GM/15ML  solution Take 30 mLs by mouth 3 (three) times daily. 12/10/23 12/09/24  [provider]  latanoprost (XALATAN) 0.005 % ophthalmic solution Place 1 drop into both eyes at bedtime. 08/21/23   [provider]  ondansetron (ZOFRAN) 4 MG tablet Take 1 tablet (4 mg total) by mouth every 6 (six) hours as needed for nausea. 10/07/23   Sherryll Burger, Pratik D, DO  pantoprazole (PROTONIX) 40 MG tablet Take 40 mg by mouth daily.    [provider]  polyethylene glycol (MIRALAX / GLYCOLAX) 17 g packet Take 17 g by mouth daily as needed for mild constipation. 10/07/23   Sherryll Burger, Pratik D, DO  rifaximin (XIFAXAN) 550 MG TABS tablet Take 1 tablet (550 mg total) by mouth 2 (two) times daily. 12/30/23 12/29/24  Lanelle Bal, DO  spironolactone (ALDACTONE) 100 MG tablet Take 1 tablet (100 mg total) by mouth daily. 12/01/23 11/30/24  Lanelle Bal,  DO  tiZANidine (ZANAFLEX) 2 MG tablet Take 2 mg by mouth 2 (two) times daily as needed. 09/20/23   [provider]    Inpatient Medications: Scheduled Meds:  amLODipine  2.5 mg Oral Daily   benazepril  20 mg Oral Daily   clopidogrel  75 mg Oral Daily   enoxaparin (LOVENOX) injection  40 mg Subcutaneous Q24H   feeding supplement  237 mL Oral BID BM   pantoprazole  40 mg Oral Daily   Continuous Infusions:  PRN Meds: albuterol, ondansetron **OR** ondansetron (ZOFRAN) IV  Allergies:    Allergies  Allergen Reactions   Bee Venom Anaphylaxis   Shellfish Allergy Anaphylaxis    Scallops    Lidocaine Hives and Rash    Lidocaine Patch (rash) - only around the area where the patch was applied.    Social History:   Social History   Socioeconomic History   Marital status: Divorced    Spouse name: Not on file   Number of children: 3   Years of education: Not on file   Highest education level: Some college, no degree  Occupational History   Occupation: Airline pilot  Tobacco Use   Smoking status: Former    Current packs/day: 0.00    Average  packs/day: 2.0 packs/day for 24.2 years (48.3 ttl pk-yrs)    Types: Cigarettes    Start date: 1963    Quit date: 12/24/1985    Years since quitting: 38.0   Smokeless tobacco: Never  Vaping Use   Vaping status: Never Used  Substance and Sexual Activity   Alcohol use: Yes    Alcohol/week: 6.0 standard drinks of alcohol    Types: 2 Glasses of wine, 4 Cans of beer per week   Drug use: No   Sexual activity: Yes  Other Topics Concern   Not on file  Social History Narrative   3 daughters, 2 live locally, one lives in [place].   3 grandsons   Active in grandson's travel ball. Works part time for [employer].    Social Drivers of Corporate investment banker Strain: Low Risk  (10/18/2023)   Overall Financial Resource Strain (CARDIA)    Difficulty of Paying Living Expenses: Not very hard  Food Insecurity: No Food Insecurity (01/19/2024)   Hunger Vital Sign    Worried About Running Out of Food in the Last Year: Never true    Ran Out of Food in the Last Year: Never true  Transportation Needs: No Transportation Needs (01/19/2024)   PRAPARE - Administrator, Civil Service (Medical): No    Lack of Transportation (Non-Medical): No  Physical Activity: Unknown (10/18/2023)   Exercise Vital Sign    Days of Exercise per Week: 0 days    Minutes of Exercise per Session: Not on file  Recent Concern: Physical Activity - Inactive (10/18/2023)   Exercise Vital Sign    Days of Exercise per Week: 0 days    Minutes of Exercise per Session: 60 min  Stress: No Stress Concern Present (10/18/2023)   [employer] of Occupational Health - Occupational Stress Questionnaire    Feeling of Stress : Not at all  Social Connections: Moderately Integrated (01/19/2024)   Social Connection and Isolation Panel [NHANES]    Frequency of Communication with Friends and Family: More than three times a week    Frequency of Social Gatherings with Friends and Family: Twice a week    Attends Religious  Services: More than 4 times per year  Active Member of Clubs or Organizations: Yes    Attends Banker Meetings: More than 4 times per year    Marital Status: Divorced  Intimate Partner Violence: Not At Risk (01/19/2024)   Humiliation, Afraid, Rape, and Kick questionnaire    Fear of Current or Ex-Partner: No    Emotionally Abused: No    Physically Abused: No    Sexually Abused: No    Family History:    Family History  Problem Relation Age of Onset   Hypertension Mother    Hypertension Father    Heart attack Father    Stroke Father    Colon cancer Neg Hx    Colon polyps Neg Hx      ROS:  Please see the history of present illness.   All other ROS reviewed and negative.     Physical Exam/Data:   Vitals:   01/19/24 0043 01/19/24 0059 01/19/24 0346 01/19/24 0921  BP:  (!) 156/92 123/75 121/71  Pulse:  83 70 78  Resp:  18 20 18   Temp: 97.9 F (36.6 C) 97.8 F (36.6 C) 97.7 F (36.5 C)   TempSrc: Oral     SpO2:  98% 99% 100%  Weight:  94.6 kg    Height:        Intake/Output Summary (Last 24 hours) at 01/19/2024 0931 Last data filed at 01/19/2024 0500 Gross per 24 hour  Intake 240 ml  Output --  Net 240 ml      01/19/2024   12:59 AM 01/18/2024    4:51 PM 01/04/2024    3:14 PM  Last 3 Weights  Weight (lbs) 208 lb 8.9 oz 207 lb 14.3 oz 208 lb  Weight (kg) 94.6 kg 94.3 kg 94.348 kg     Body mass index is 26.78 kg/m.  General:  Well nourished, well developed male appearing in no acute distress HEENT: normal Neck: no JVD Vascular: No carotid bruits; Distal pulses 2+ bilaterally Cardiac:  normal S1, S2; RRR; no murmur  Lungs:  clear to auscultation bilaterally, no wheezing, rhonchi or rales  Abd: soft, nontender, no hepatomegaly  Ext: no pitting edema Musculoskeletal:  No deformities, BUE and BLE strength normal and equal Skin: warm and dry  Neuro:  CNs 2-12 intact, no focal abnormalities noted Psych:  Normal affect   EKG:  The EKG was personally  reviewed and demonstrates: Normal sinus rhythm, heart rate 97 with inferior infarct pattern. No acute ST changes   Relevant CV Studies:  Echocardiogram: 08/2023 IMPRESSIONS     1. Left ventricular ejection fraction, by estimation, is 55 to 60%. The  left ventricle has normal function. The left ventricle has no regional  wall motion abnormalities. Left ventricular diastolic parameters were  normal.   2. Right ventricular systolic function is normal. The right ventricular  size is normal.   3. Left atrial size was moderately dilated.   4. Right atrial size was mildly dilated.   5. The mitral valve is abnormal. Mild mitral valve regurgitation. No  evidence of mitral stenosis. Moderate mitral annular calcification.   6. Mean gradient 9.3 peak 17.5 mmHg DVI 0.54 and AVA 2.3 cm2 . The aortic  valve is tricuspid. There is moderate calcification of the aortic valve.  Aortic valve regurgitation is not visualized. Aortic valve  sclerosis/calcification is present, without any   evidence of aortic stenosis.   7. Aortic dilatation noted. There is mild dilatation of the aortic root,  measuring 39 mm.   8. The inferior  vena cava is normal in size with greater than 50%  respiratory variability, suggesting right atrial pressure of 3 mmHg.   Laboratory Data:  High Sensitivity Troponin:   Recent Labs  Lab 01/18/24 1754 01/18/24 1940  TROPONINIHS 19* 23*     Chemistry Recent Labs  Lab 01/18/24 1940 01/19/24 0437  NA 133* 134*  K 3.6 3.8  CL 102 103  CO2 21* 24  GLUCOSE 91 148*  BUN 9 10  CREATININE 0.89 1.00  CALCIUM 9.1 9.1  MG  --  1.6*  GFRNONAA >60 >60  ANIONGAP 10 7    Recent Labs  Lab 01/18/24 1940 01/19/24 0437  PROT 7.1 6.5  ALBUMIN 2.7* 2.5*  AST 58* 55*  ALT 42 39  ALKPHOS 87 70  BILITOT 3.0* 3.6*   Lipids No results for input(s): "CHOL", "TRIG", "HDL", "LABVLDL", "LDLCALC", "CHOLHDL" in the last 168 hours.  Hematology Recent Labs  Lab 01/18/24 1754  01/19/24 0437  WBC 9.4 9.0  RBC 4.93 4.53  HGB 15.8 14.4  HCT 46.0 42.8  MCV 93.3 94.5  MCH 32.0 31.8  MCHC 34.3 33.6  RDW 17.5* 17.2*  PLT 202 151   Thyroid No results for input(s): "TSH", "FREET4" in the last 168 hours.  BNPNo results for input(s): "BNP", "PROBNP" in the last 168 hours.  DDimer No results for input(s): "DDIMER" in the last 168 hours.   Radiology/Studies:  [facility] Chest Port 1 View Result Date: 01/18/2024 CLINICAL DATA:  Weakness, palpitations. EXAM: PORTABLE CHEST 1 VIEW COMPARISON:  October 05, 2023. FINDINGS: The heart size and mediastinal contours are within normal limits. Stable diffuse reticular densities are noted throughout both lungs which may represent chronic interstitial lung disease, although acute superimposed edema or atypical inflation cannot be excluded. The visualized skeletal structures are unremarkable. IMPRESSION: Stable diffuse reticular densities are noted as noted above. Electronically Signed   By: Lupita Raider M.D.   On: 01/18/2024 18:22     Assessment and Plan:   1. Palpitations/Narrow-complex Tachycardia - Reports his heart rate was in the 160's yesterday and EMS was concerned about atrial fibrillation but he had converted back to normal sinus rhythm upon arrival to the ED. Majority of telemetry shows normal sinus rhythm with frequent PAC's. He does have an episode of a narrow complex tachycardia and unclear if this is atrial tachycardia or atrial fibrillation. Will review further with Dr. Jenene Slicker. If found to have definitive atrial fibrillation, would require anticoagulation given his CHA2DS2-VASc Score of (HTN, Vascular, Age (2), CVA (2)) of 6. Would await EGD prior to initiating to assess for varices.  - He was previously on Toprol-XL 50 mg daily by review of notes from 2022 but reports this was discontinued in the interim and he is unsure why. Would plan to restart beta-blocker therapy but can do so at a lower dose of 25 mg daily or switch  to Coreg given cirrhosis.  2. CAD/HLD - He is s/p NSTEMI in 2016 with DES to PDA and staged PCI to Diagonal. Hs troponin values flat at 19 and 23 this admission and he denies any recent anginal symptoms. He has been on Plavix but holding this since Saturday due to upcoming EGD. If needing to start anticoagulation, would likely discontinue given most recent invention was 9 years ago. Has not been on statin therapy (possibly due to elevated LFTs).  3. HTN - BP has overall been well-controlled, at 121/71 on most recent check. Continue current medical therapy with Amlodipine 2.5 mg  daily and Benazepril 20 mg daily.  4. Cirrhosis - He is being followed by GI as an outpatient and is scheduled for an EGD this coming Friday.  5. Hypomagnesia - Mg at 1.6 this morning. Will order supplementation.   For questions or updates, please contact [facility] Please consult [email or web address] for contact info under    Signed, Ellsworth Lennox, PA-C  01/19/2024 9:31 AM

## 2024-01-19 NOTE — TOC CM/SW Note (Signed)
 Transition of Care [facility]) - Inpatient Brief Assessment   Patient Details  Name: Mitchell Oliver MRN: [id number] Date of Birth: 1946-10-22  Transition of Care [facility]) CM/SW Contact:    Villa Herb, LCSWA Phone Number: 01/19/2024, 9:50 AM   Clinical Narrative: Transition of Care Department [facility]) has reviewed patient and no TOC needs have been identified at this time. We will continue to monitor patient advancement through interdiciplinary progression rounds. If new patient transition needs arise, please place a TOC consult.   Transition of Care Asessment: Insurance and Status: Insurance coverage has been reviewed Patient has primary care physician: Yes Home environment has been reviewed: From home Prior level of function:: Independent Prior/Current Home Services: No current home services Social Drivers of Health Review: SDOH reviewed no interventions necessary Readmission risk has been reviewed: Yes Transition of care needs: no transition of care needs at this time

## 2024-01-19 NOTE — Progress Notes (Signed)
 Patient has been stable upon admission to floor.  Heart rate has been controlled, but still irregular.  Patient has had no complaints of pain.  CCMD reported a. Fib.  Vitals stable.

## 2024-01-19 NOTE — Plan of Care (Signed)

## 2024-01-19 NOTE — Progress Notes (Signed)
 Dr.Carver and Dr.Kiel notified that patient is currently in hospital for palpitations and has EGD scheduled for 01/21/24. Both Dr.Carver and Dr.Kiel okay with proceeding as scheduled with the EGD.  Spoke with patient completed PAT and instructions given to patient via phone.

## 2024-01-19 NOTE — Telephone Encounter (Signed)
 Patient needs 14 day zio monitor for A. Fib per Dr. Jenene Slicker who saw pt in the hospital.   Monitor to be mailed to pt's home.

## 2024-01-19 NOTE — Discharge Summary (Signed)
 Mitchell Oliver, is a 78 y.o. male  DOB December 05, 1945  MRN [id number].  Admission date:  01/18/2024  Admitting Physician  Frankey Shown, DO  Discharge Date:  01/19/2024   Primary MD  [name], DO  Recommendations for primary care physician for things to follow:  1)Very Low-salt diet advised---Less than 2 gm of Sodium per day advised----ok to use Mrs DASH salt substitute instead of Salt 2)Weigh yourself daily, call if you gain more than 3 pounds in 1 day or more than 5 pounds in 1 week as your diuretic medications may need to be adjusted 3)Limit your Fluid  intake to no more than 60 ounces (1.8 Liters) per day 4)Avoid ibuprofen/Advil/Aleve/Motrin/Goody Powders/Naproxen/BC powders/Meloxicam/Diclofenac/Indomethacin and other Nonsteroidal anti-inflammatory medications as these will make you more likely to bleed and can cause stomach ulcers, can also cause Kidney problems.  5)Follow-up Gastroenterologist Dr. Earnest Bailey with [facility]---on Friday 01/21/24 for EGD procedure as previously discussed -address: [address] [place], [place] [address], Phone: [phone] 6)You will get Zio patch/heart monitor device at home with instructions on how to wear it for 2 weeks--to look for irregular heartbeat including atrial fibrillation 7)Please continue to hold Plavix/clopidogrel--to allow for your EGD/upper endoscopy procedure on 01/21/2024 --- If you are subsequently started on a blood thinner like Coumadin or Eliquis after your EGD procedure you will need to stop your Plavix/clopidogrel altogether  Admission Diagnosis  Palpitations [R00.2] Heart palpitations [R00.2]  Discharge Diagnosis  Palpitations [R00.2] Heart palpitations [R00.2]    Principal Problem:   Heart palpitations Active Problems:   CAD S/P percutaneous coronary angioplasty   Essential hypertension, benign   Type 2 diabetes  mellitus with other circulatory complications (HCC)   COPD (chronic obstructive pulmonary disease) (HCC)   Near syncope   Elevated troponin   Hypoalbuminemia due to protein-calorie malnutrition [facility])      Past Medical History:  Diagnosis Date   Allergy    Ascites    Cirrhosis (HCC)    COPD (chronic obstructive pulmonary disease) (HCC)    CVA (cerebral vascular accident) (HCC) 10/26/2010   denies residual on 07/22/2015   ED (erectile dysfunction)    Family history of adverse reaction to anesthesia    "think my mother had real bad headaches after anesthesia"   GERD (gastroesophageal reflux disease)    Hypercholesterolemia    Hypertension    IFG (impaired fasting glucose)    NSTEMI (non-ST elevated myocardial infarction) (HCC)    Pneumonia ~ 2005    Past Surgical History:  Procedure Laterality Date   APPENDECTOMY  2009 [facility]   BACK SURGERY     BIOPSY  05/04/2014   Procedure: BIOPSY;  Surgeon: West Bali, MD;  Location: [facility];  Service: Endoscopy;;   CARDIAC CATHETERIZATION N/A 07/18/2015   Procedure: Left Heart Cath and Coronary Angiography;  Surgeon: Lyn Records, MD;  Location: [facility] INVASIVE CV LAB;  Service: Cardiovascular;  Laterality: N/A;   CARDIAC CATHETERIZATION N/A 07/18/2015   Procedure: Coronary Stent Intervention;  Surgeon: Barry Dienes  Katrinka Blazing, MD;  Location: [facility] INVASIVE CV LAB;  Service: Cardiovascular;  Laterality: N/A;   CARDIAC CATHETERIZATION N/A 07/22/2015   Procedure: Coronary Stent Intervention;  Surgeon: Lyn Records, MD;  Location: [facility] INVASIVE CV LAB;  Service: Cardiovascular;  Laterality: N/A;   COLONOSCOPY  2009   COLONOSCOPY N/A 08/04/2019   Procedure: COLONOSCOPY;  Surgeon: West Bali, MD;  Location: [facility];  Service: Endoscopy;  Laterality: N/A;  12:30   ESOPHAGOGASTRODUODENOSCOPY N/A 05/04/2014   Procedure: ESOPHAGOGASTRODUODENOSCOPY (EGD);  Surgeon: West Bali, MD;  Location: [facility];  Service: Endoscopy;  Laterality: N/A;  9:15    IR PARACENTESIS  12/02/2023   IR TRANSCATHETER BX  12/02/2023   POLYPECTOMY  08/04/2019   Procedure: POLYPECTOMY;  Surgeon: West Bali, MD;  Location: [facility];  Service: Endoscopy;;  hepatic flexure, ascending colon, transverse colon, splenic flexure, descending colon    POSTERIOR LAMINECTOMY / DECOMPRESSION LUMBAR SPINE  06/03/2015   SAVORY DILATION N/A 05/04/2014   Procedure: SAVORY DILATION;  Surgeon: West Bali, MD;  Location: [facility];  Service: Endoscopy;  Laterality: N/A;     HPI  from the history and physical done on the day of admission:   HPI: Mitchell Oliver is a 78 y.o. male with medical history significant of hypertension, COPD, coronary artery disease, CVA, diabetes mellitus, NSTEMI who presents to the emergency department due to palpitations.  He complained of heart rate suddenly increasing to 160s with an associated weakness and sensation of dizziness.  EMS was activated, but by the time EMS arrived, heart rate as decreased to about 110s and EMS stated that the rhythm was similar to atrial fibrillation, however, by the time patient arrived to ED, he already converted to normal sinus rhythm with occasional PACs.   ED Course:  In the emergency department, he was hemodynamically stable.  Workup in the ED showed normal CBC, BMP was normal except for sodium of 133, bicarb 21, albumin 2.7, AST 58.  Troponin 19 > 23. Chest x-ray showed stable diffuse reticular densities Cardiology was consulted and recommended prescribing metoprolol to take when symptoms occur again.  However, since patient lives alone, he did not feel comfortable going home due to near syncopal episode he had at home.  Patient also states that he has an EGD scheduled for Friday (3/28).  Per medical record, daughter spoke with gastroenterologist (Dr. Marletta Lor) and was told that the procedure may be done earlier if patient is hospitalized. Hospitalist was asked to admit patient for further evaluation and  management.    Review of Systems: Review of systems as noted in the HPI. All other systems reviewed and are negative.   Hospital Course:   Assessment and Plan: 1)PAFIb with Heart palpitations and Near syncope On Telemetry A-fib noted  -troponins 19 > 23 EKG showed normal sinus rhythm at a rate of 72 bpm with APCs -Echo with EF of 65 to 70%, no regional wall motion normalities, no aortic stenosis, prior echo from 09/22/2023 with atrial enlargement  CHA2DS2- VASc score   is = 7 (HTN, DM2, CAD/vascular , age x 2, h/o stroke x 2)   Which is  equal to = 11.2 % annual risk of stroke  This patients CHA2DS2-VASc Score and unadjusted Ischemic Stroke Rate (% per year) is equal to 11.2 % stroke rate/year from a score of 7 -Hold off on full anticoagulation pending EGD on 01/21/2024 -If patient is child Pugh class A or B he will qualify  for Eliquis, if he is Child Pugh Class C he will need Coumadin.  -Given increased bleeding risk due to underlying liver disease and possible varices patient may need to be evaluated for Watchman device -2-week Zio patch to assess A-fib burden recommended Elevated troponin possibly secondary to type II demand ischemia Troponin 19 > 23; patient denies chest pain -Cardiology consult appreciated   2)CAD S/P percutaneous coronary angioplasty PCI with DES 2016.   -Plavix remains on hold to allow for EGD on 01/21/2024   3)COPD  Continue albuterol as needed per minute   4)DM2-A1c 5.8 reflecting excellent diabetic control PTA   5)HTN--- resume PTA benazepril and amlodipine   6)GERD Continue Protonix  7)Liver Cirrhosis--- EGD on 01/21/2024 to evaluate for possible esophageal varices and portal hypertension -Give Coreg for presumed portal hypertension --If patient is child Pugh class A or B he will qualify for Eliquis, if he is Child Pugh Class C he will need Coumadin.  -Given increased bleeding risk due to underlying liver disease and possible varices patient may need  to be evaluated for Watchman device  Discharge Condition: Stable  Follow UP   Follow-up Information     Lanelle Bal, DO. Schedule an appointment as soon as possible for a visit in 2 day(s).   Specialty: Gastroenterology Why: For EGD/upper endoscopy Procedure Contact information: [address] [place] [address] [phone]                 Consults obtained -cardiology  Diet and Activity recommendation:  As advised  Discharge Instructions    Discharge Instructions     Call MD for:  difficulty breathing, headache or visual disturbances   Complete by: As directed    Call MD for:  persistant dizziness or light-headedness   Complete by: As directed    Call MD for:  temperature >100.4   Complete by: As directed    Diet - low sodium heart healthy   Complete by: As directed    Discharge instructions   Complete by: As directed    1)Very Low-salt diet advised---Less than 2 gm of Sodium per day advised----ok to use Mrs DASH salt substitute instead of Salt 2)Weigh yourself daily, call if you gain more than 3 pounds in 1 day or more than 5 pounds in 1 week as your diuretic medications may need to be adjusted 3)Limit your Fluid  intake to no more than 60 ounces (1.8 Liters) per day 4)Avoid ibuprofen/Advil/Aleve/Motrin/Goody Powders/Naproxen/BC powders/Meloxicam/Diclofenac/Indomethacin and other Nonsteroidal anti-inflammatory medications as these will make you more likely to bleed and can cause stomach ulcers, can also cause Kidney problems.  5)Follow-up Gastroenterologist Dr. Earnest Bailey with [facility]---on Friday 01/21/24 for EGD procedure as previously discussed -address: [address] [place], [place] [address], Phone: [phone] 6)You will get Zio patch/heart monitor device at home with instructions on how to wear it for 2 weeks--to look for irregular heartbeat including atrial fibrillation 7)Please continue to hold Plavix/clopidogrel--to  allow for your EGD/upper endoscopy procedure on 01/21/2024 --- If you are subsequently started on a blood thinner like Coumadin or Eliquis after your EGD procedure you will need to stop your Plavix/clopidogrel altogether   Increase activity slowly   Complete by: As directed        Discharge Medications     Allergies as of 01/19/2024       Reactions   Bee Venom Anaphylaxis   Shellfish Allergy Anaphylaxis   Scallops    Lidocaine Hives, Rash   Lidocaine Patch (  rash) - only around the area where the patch was applied.        Medication List     STOP taking these medications    amLODipine 2.5 MG tablet Commonly known as: NORVASC   clopidogrel 75 MG tablet Commonly known as: PLAVIX   doxycycline 100 MG tablet Commonly known as: VIBRA-TABS   tiZANidine 2 MG tablet Commonly known as: ZANAFLEX       TAKE these medications    albuterol 108 (90 Base) MCG/ACT inhaler Commonly known as: VENTOLIN HFA INHALE 2 PUFFS INTO THE LUNGS EVERY 6 HOURS AS NEEDED FOR WHEEZING OR SHORTNESS OF BREATH What changed: when to take this   benazepril 20 MG tablet Commonly known as: LOTENSIN TAKE 1 TABLET(20 MG) BY MOUTH DAILY   carvedilol 3.125 MG tablet Commonly known as: COREG Take 1 tablet (3.125 mg total) by mouth 2 (two) times daily with a meal.   dorzolamide-timolol 2-0.5 % ophthalmic solution Commonly known as: COSOPT Place 1 drop into both eyes 2 (two) times daily.   furosemide 40 MG tablet Commonly known as: LASIX Take 1 tablet (40 mg total) by mouth daily.   ipratropium 0.06 % nasal spray Commonly known as: ATROVENT Place 2 sprays into both nostrils 4 (four) times daily.   Iron (Ferrous Sulfate) 325 (65 Fe) MG Tabs Take 325 mg by mouth every Tuesday, Thursday, Saturday, and Sunday. Start taking on: January 20, 2024 What changed: when to take this   Jardiance 10 MG Tabs tablet Generic drug: empagliflozin TAKE 1 TABLET(10 MG) BY MOUTH DAILY   lactulose 10 GM/15ML  solution Commonly known as: CHRONULAC Take 30 mLs (20 g total) by mouth 2 (two) times daily. What changed: when to take this   latanoprost 0.005 % ophthalmic solution Commonly known as: XALATAN Place 1 drop into both eyes at bedtime.   ondansetron 4 MG tablet Commonly known as: ZOFRAN Take 1 tablet (4 mg total) by mouth every 6 (six) hours as needed for nausea.   pantoprazole 40 MG tablet Commonly known as: PROTONIX Take 1 tablet (40 mg total) by mouth daily.   polyethylene glycol 17 g packet Commonly known as: MIRALAX / GLYCOLAX Take 17 g by mouth daily as needed for mild constipation.   rifaximin 550 MG Tabs tablet Commonly known as: XIFAXAN Take 1 tablet (550 mg total) by mouth 2 (two) times daily.   spironolactone 100 MG tablet Commonly known as: ALDACTONE Take 1 tablet (100 mg total) by mouth daily.       Major procedures and Radiology Reports - PLEASE review detailed and final reports for all details, in brief -   ECHOCARDIOGRAM LIMITED Result Date: 01/19/2024    ECHOCARDIOGRAM LIMITED REPORT   Patient Name:   Mitchell Oliver Date of Exam: 01/19/2024 Medical Rec #:  [id number]        Height:       74.0 in Accession #:    [id number]       Weight:       208.6 lb Date of Birth:  10/04/1946         BSA:          2.213 m Patient Age:    77 years         BP:           11 9/72 mmHg Patient Gender: M                HR:  74 bpm. [facility] Procedure: Limited Echo, Limited Color Doppler and Cardiac Doppler (Both            Spectral and Color Flow Doppler were utilized during procedure). Indications:    Syncope  History:        Patient has prior history of Echocardiogram examinations, most                 recent 09/22/2023. CAD and Previous Myocardial Infarction,                 Stroke and COPD, Signs/Symptoms:Dyspnea; Risk Factors:Former                 Smoker, Dyslipidemia and Hypertension.  Sonographer:    Vern Claude Referring Phys: [id number] OLADAPO ADEFESO  IMPRESSIONS  1. Limited study.  2. Left ventricular ejection fraction, by estimation, is 65 to 70%. The left ventricle has normal function. The left ventricle has no regional wall motion abnormalities.  3. Right ventricular systolic function is normal. The right ventricular size is normal.  4. The mitral valve is degenerative. Trivial mitral valve regurgitation.  5. The aortic valve is tricuspid. There is moderate calcification of the aortic valve. Aortic valve sclerosis/calcification is present, without any evidence of aortic stenosis. Aortic valve mean gradient measures 9.0 mmHg.  6. Aortic dilatation noted. There is mild dilatation of the aortic root, measuring 42 mm. Comparison(s): Prior images reviewed side by side. LVEF vigorous at 65-70%. FINDINGS  Left Ventricle: Left ventricular ejection fraction, by estimation, is 65 to 70%. The left ventricle has normal function. The left ventricle has no regional wall motion abnormalities. The left ventricular internal cavity size was normal in size. There is  no left ventricular hypertrophy. Right Ventricle: The right ventricular size is normal. No increase in right ventricular wall thickness. Right ventricular systolic function is normal. Pericardium: Trivial pericardial effusion is present. Presence of epicardial fat layer. Mitral Valve: The mitral valve is degenerative in appearance. Mild mitral annular calcification. Trivial mitral valve regurgitation. Tricuspid Valve: The tricuspid valve is grossly normal. Tricuspid valve regurgitation is trivial. Aortic Valve: The aortic valve is tricuspid. There is moderate calcification of the aortic valve. There is mild aortic valve annular calcification. Aortic valve sclerosis/calcification is present, without any evidence of aortic stenosis. Aortic valve mean gradient measures 9.0 mmHg. Aortic valve peak gradient measures 17.8 mmHg. Aortic valve area, by VTI measures 3.65 cm. Pulmonic Valve: The pulmonic valve was not well  visualized. Pulmonic valve regurgitation is trivial. Aorta: Aortic dilatation noted. There is mild dilatation of the aortic root, measuring 42 mm. LEFT VENTRICLE PLAX 2D LVIDd:         4.80 cm LVIDs:         3.10 cm LV PW:         1.00 cm LV IVS:        0.90 cm LVOT diam:     2.20 cm LV SV:         123 LV SV Index:   56 LVOT Area:     3.80 cm  LV Volumes (MOD) LV vol d, MOD A2C: 109.0 ml LV vol d, MOD A4C: 158.0 ml LV vol s, MOD A2C: 46.7 ml LV vol s, MOD A4C: 48.6 ml LV SV MOD A2C:     62.3 ml LV SV MOD A4C:     158.0 ml LV SV MOD BP:      85.4 ml RIGHT VENTRICLE RV S prime:  12.70 cm/s AORTIC VALVE AV Area (Vmax):    2.77 cm AV Area (Vmean):   2.94 cm AV Area (VTI):     3.65 cm AV Vmax:           211.00 cm/s AV Vmean:          143.000 cm/s AV VTI:            0.337 m AV Peak Grad:      17.8 mmHg AV Mean Grad:      9.0 mmHg LVOT Vmax:         153.50 cm/s LVOT Vmean:        110.500 cm/s LVOT VTI:          0.324 m LVOT/AV VTI ratio: 0.96  AORTA Ao Root diam: 4.20 cm Ao Asc diam:  3.30 cm MITRAL VALVE MV Area (PHT): 2.36 cm    SHUNTS MV Decel Time: 322 msec    Systemic VTI:  0.32 m MV E velocity: 70.20 cm/s  Systemic Diam: 2.20 cm MV A velocity: 88.20 cm/s MV E/A ratio:  0.80 Nona Dell MD Electronically signed by Nona Dell MD Signature Date/Time: 01/19/2024/2:57:20 PM    Final    DG Chest Port 1 View Result Date: 01/18/2024 CLINICAL DATA:  Weakness, palpitations. EXAM: PORTABLE CHEST 1 VIEW COMPARISON:  October 05, 2023. FINDINGS: The heart size and mediastinal contours are within normal limits. Stable diffuse reticular densities are noted throughout both lungs which may represent chronic interstitial lung disease, although acute superimposed edema or atypical inflation cannot be excluded. The visualized skeletal structures are unremarkable. IMPRESSION: Stable diffuse reticular densities are noted as noted above. Electronically Signed   By: Lupita Raider M.D.   On: 01/18/2024 18:22   Today    Subjective    Mitchell Oliver today has no new complaints  No fever  Or chills  -        Plan of care discussed with patient's daughter Ms. Alben Spittle at bedside   Patient has been seen and examined prior to discharge   Objective   Blood pressure 119/72, pulse 74, temperature 98.4 F (36.9 C), temperature source Oral, resp. rate 19, height 6\' 2"  (1.88 m), weight 94.6 kg, SpO2 100%.   Intake/Output Summary (Last 24 hours) at 01/19/2024 1539 Last data filed at 01/19/2024 1008 Gross per 24 hour  Intake 480 ml  Output --  Net 480 ml    Exam Gen:- Awake Alert, no acute distress  HEENT:- Equality.AT, No sclera icterus Ears-HOH  Neck-Supple Neck,No JVD,.  Lungs-  CTAB , good air movement bilaterally CV- S1, S2 normal, irregular, 3/6 SM Abd-  +ve B.Sounds, Abd Soft, No tenderness,    Extremity/Skin:- No  edema,   good pulses Psych-affect is appropriate, oriented x3 Neuro-no new focal deficits, no tremors    Data Review   CBC w Diff:  Lab Results  Component Value Date   WBC 9.0 01/19/2024   HGB 14.4 01/19/2024   HGB 14.7 12/31/2023   HCT 42.8 01/19/2024   HCT 43.4 12/31/2023   PLT 151 01/19/2024   PLT 182 12/31/2023   LYMPHOPCT 28 01/18/2024   MONOPCT 10 01/18/2024   EOSPCT 3 01/18/2024   BASOPCT 1 01/18/2024   CMP:  Lab Results  Component Value Date   NA 134 (L) 01/19/2024   NA 133 (L) 12/06/2023   K 3.8 01/19/2024   CL 103 01/19/2024   CO2 24 01/19/2024   BUN 10 01/19/2024   BUN 9  12/06/2023   CREATININE 1.00 01/19/2024   CREATININE 1.11 07/03/2014   PROT 6.5 01/19/2024   PROT 7.4 12/31/2023   ALBUMIN 2.5 (L) 01/19/2024   ALBUMIN 2.9 (L) 12/31/2023   BILITOT 3.6 (H) 01/19/2024   BILITOT 2.6 (H) 12/31/2023   ALKPHOS 70 01/19/2024   AST 55 (H) 01/19/2024   ALT 39 01/19/2024   Total Discharge time is about 33 minutes  Shon Hale M.D on 01/19/2024 at 3:39 PM  Go to [email or web address] -  for contact info  [facility] - Office  [phone]

## 2024-01-21 ENCOUNTER — Encounter (HOSPITAL_COMMUNITY): Payer: Self-pay | Admitting: Internal Medicine

## 2024-01-21 ENCOUNTER — Ambulatory Visit (HOSPITAL_COMMUNITY)
Admission: RE | Admit: 2024-01-21 | Discharge: 2024-01-21 | Disposition: A | Discharge status: Home / Self Care | Source: Ambulatory Visit | Attending: Internal Medicine | Admitting: Internal Medicine

## 2024-01-21 ENCOUNTER — Encounter (HOSPITAL_COMMUNITY)
Admission: RE | Disposition: A | Discharge status: Home / Self Care | Payer: Self-pay | Source: Ambulatory Visit | Attending: Internal Medicine

## 2024-01-21 ENCOUNTER — Ambulatory Visit (HOSPITAL_COMMUNITY): Admitting: Certified Registered Nurse Anesthetist

## 2024-01-21 ENCOUNTER — Other Ambulatory Visit: Payer: Self-pay

## 2024-01-21 DIAGNOSIS — J449 Chronic obstructive pulmonary disease, unspecified: Secondary | ICD-10-CM | POA: Diagnosis not present

## 2024-01-21 DIAGNOSIS — R188 Other ascites: Secondary | ICD-10-CM | POA: Diagnosis not present

## 2024-01-21 DIAGNOSIS — K7460 Unspecified cirrhosis of liver: Secondary | ICD-10-CM | POA: Diagnosis present

## 2024-01-21 DIAGNOSIS — Z8673 Personal history of transient ischemic attack (TIA), and cerebral infarction without residual deficits: Secondary | ICD-10-CM | POA: Diagnosis not present

## 2024-01-21 DIAGNOSIS — I4891 Unspecified atrial fibrillation: Secondary | ICD-10-CM | POA: Insufficient documentation

## 2024-01-21 DIAGNOSIS — K222 Esophageal obstruction: Secondary | ICD-10-CM

## 2024-01-21 DIAGNOSIS — I2510 Atherosclerotic heart disease of native coronary artery without angina pectoris: Secondary | ICD-10-CM | POA: Diagnosis not present

## 2024-01-21 DIAGNOSIS — Z79899 Other long term (current) drug therapy: Secondary | ICD-10-CM | POA: Insufficient documentation

## 2024-01-21 DIAGNOSIS — I10 Essential (primary) hypertension: Secondary | ICD-10-CM | POA: Insufficient documentation

## 2024-01-21 DIAGNOSIS — K7682 Hepatic encephalopathy: Secondary | ICD-10-CM | POA: Insufficient documentation

## 2024-01-21 DIAGNOSIS — I252 Old myocardial infarction: Secondary | ICD-10-CM | POA: Diagnosis not present

## 2024-01-21 DIAGNOSIS — Z87891 Personal history of nicotine dependence: Secondary | ICD-10-CM | POA: Insufficient documentation

## 2024-01-21 DIAGNOSIS — K766 Portal hypertension: Secondary | ICD-10-CM | POA: Insufficient documentation

## 2024-01-21 DIAGNOSIS — D649 Anemia, unspecified: Secondary | ICD-10-CM | POA: Diagnosis not present

## 2024-01-21 DIAGNOSIS — K3189 Other diseases of stomach and duodenum: Secondary | ICD-10-CM

## 2024-01-21 DIAGNOSIS — K219 Gastro-esophageal reflux disease without esophagitis: Secondary | ICD-10-CM | POA: Diagnosis not present

## 2024-01-21 DIAGNOSIS — Z7902 Long term (current) use of antithrombotics/antiplatelets: Secondary | ICD-10-CM | POA: Insufficient documentation

## 2024-01-21 DIAGNOSIS — Z955 Presence of coronary angioplasty implant and graft: Secondary | ICD-10-CM | POA: Insufficient documentation

## 2024-01-21 HISTORY — PX: ESOPHAGOGASTRODUODENOSCOPY: SHX5428

## 2024-01-21 LAB — GLUCOSE, CAPILLARY: Glucose-Capillary: 65 mg/dL — ABNORMAL LOW (ref 70–99)

## 2024-01-21 SURGERY — EGD (ESOPHAGOGASTRODUODENOSCOPY)
Anesthesia: General

## 2024-01-21 MED ORDER — DEXTROSE 50 % IV SOLN
INTRAVENOUS | Status: AC
  Filled 2024-01-21: qty 50

## 2024-01-21 MED ORDER — LACTATED RINGERS IV SOLN: INTRAVENOUS | Status: DC

## 2024-01-21 MED ORDER — PROPOFOL 500 MG/50ML IV EMUL
INTRAVENOUS | Status: DC | PRN
  Administered 2024-01-21: 100 ug/kg/min via INTRAVENOUS
  Administered 2024-01-21: 60 mg via INTRAVENOUS

## 2024-01-21 MED ORDER — DEXTROSE 50 % IV SOLN
25.0000 mL | Freq: Once | INTRAVENOUS | Status: AC
  Administered 2024-01-21: 25 mL via INTRAVENOUS

## 2024-01-21 MED ORDER — PHENYLEPHRINE 80 MCG/ML (10ML) SYRINGE FOR IV PUSH (FOR BLOOD PRESSURE SUPPORT)
PREFILLED_SYRINGE | INTRAVENOUS | Status: AC
  Filled 2024-01-21: qty 10

## 2024-01-21 MED ORDER — PROPOFOL 500 MG/50ML IV EMUL
INTRAVENOUS | Status: AC
  Filled 2024-01-21: qty 50

## 2024-01-21 NOTE — Anesthesia Preprocedure Evaluation (Signed)
 Anesthesia Evaluation  Patient identified by MRN, date of birth, ID band Patient awake    Reviewed: Allergy & Precautions, H&P , NPO status , Patient's Chart, lab work & pertinent test results, reviewed documented beta blocker date and time   History of Anesthesia Complications (+) Family history of anesthesia reaction  Airway Mallampati: II  TM Distance: >3 FB Neck ROM: full    Dental no notable dental hx.    Pulmonary neg pulmonary ROS, pneumonia, COPD, former smoker   Pulmonary exam normal breath sounds clear to auscultation       Cardiovascular Exercise Tolerance: Good hypertension, + CAD and + Past MI  negative cardio ROS  Rhythm:regular Rate:Normal     Neuro/Psych CVA negative neurological ROS  negative psych ROS   GI/Hepatic negative GI ROS, Neg liver ROS,GERD  ,,  Endo/Other  negative endocrine ROSdiabetes    Renal/GU negative Renal ROS  negative genitourinary   Musculoskeletal   Abdominal   Peds  Hematology negative hematology ROS (+) Blood dyscrasia, anemia   Anesthesia Other Findings   Reproductive/Obstetrics negative OB ROS                             Anesthesia Physical Anesthesia Plan  ASA: 3  Anesthesia Plan: General   Post-op Pain Management:    Induction:   PONV Risk Score and Plan: Propofol infusion  Airway Management Planned:   Additional Equipment:   Intra-op Plan:   Post-operative Plan:   Informed Consent: I have reviewed the patients History and Physical, chart, labs and discussed the procedure including the risks, benefits and alternatives for the proposed anesthesia with the patient or authorized representative who has indicated his/her understanding and acceptance.     Dental Advisory Given  Plan Discussed with: CRNA  Anesthesia Plan Comments:        Anesthesia Quick Evaluation

## 2024-01-21 NOTE — Transfer of Care (Signed)
 Immediate Anesthesia Transfer of Care Note  Patient: Mitchell Oliver  Procedure(s) Performed: EGD (ESOPHAGOGASTRODUODENOSCOPY)  Patient Location: Short Stay  Anesthesia Type:General  Level of Consciousness: awake, alert , and oriented  Airway & Oxygen Therapy: Patient Spontanous Breathing  Post-op Assessment: Report given to RN, Post -op Vital signs reviewed and stable, Patient moving all extremities X 4, and Patient able to stick tongue midline  Post vital signs: Reviewed and stable  Last Vitals:  Vitals Value Taken Time  BP 109/64   Temp 97.5   Pulse 66   Resp 15   SpO2 100     Last Pain:  Vitals:   01/21/24 1031  TempSrc:   PainSc: 0-No pain         Complications: No notable events documented.

## 2024-01-21 NOTE — Anesthesia Postprocedure Evaluation (Signed)
 Anesthesia Post Note  Patient: Mitchell Oliver  Procedure(s) Performed: EGD (ESOPHAGOGASTRODUODENOSCOPY)  Patient location during evaluation: Phase II Anesthesia Type: General Level of consciousness: awake Pain management: pain level controlled Vital Signs Assessment: post-procedure vital signs reviewed and stable Respiratory status: spontaneous breathing and respiratory function stable Cardiovascular status: blood pressure returned to baseline and stable Postop Assessment: no headache and no apparent nausea or vomiting Anesthetic complications: no Comments: Late entry   No notable events documented.   Last Vitals:  Vitals:   01/21/24 0929 01/21/24 1044  BP: 124/71 109/64  Pulse: (!) 56 64  Resp: (!) 25 14  Temp: 36.4 C (!) 36.4 C  SpO2: 100% 99%    Last Pain:  Vitals:   01/21/24 1044  TempSrc:   PainSc: 0-No pain                 Windell Norfolk

## 2024-01-21 NOTE — Discharge Instructions (Signed)
 EGD Discharge instructions Please read the instructions outlined below and refer to this sheet in the next few weeks. These discharge instructions provide you with general information on caring for yourself after you leave the hospital. Your doctor may also give you specific instructions. While your treatment has been planned according to the most current medical practices available, unavoidable complications occasionally occur. If you have any problems or questions after discharge, please call your doctor. ACTIVITY You may resume your regular activity but move at a slower pace for the next 24 hours.  Take frequent rest periods for the next 24 hours.  Walking will help expel (get rid of) the air and reduce the bloated feeling in your abdomen.  No driving for 24 hours (because of the anesthesia (medicine) used during the test).  You may shower.  Do not sign any important legal documents or operate any machinery for 24 hours (because of the anesthesia used during the test).  NUTRITION Drink plenty of fluids.  You may resume your normal diet.  Begin with a light meal and progress to your normal diet.  Avoid alcoholic beverages for 24 hours or as instructed by your caregiver.  MEDICATIONS You may resume your normal medications unless your caregiver tells you otherwise.  WHAT YOU CAN EXPECT TODAY You may experience abdominal discomfort such as a feeling of fullness or "gas" pains.  FOLLOW-UP Your doctor will discuss the results of your test with you.  SEEK IMMEDIATE MEDICAL ATTENTION IF ANY OF THE FOLLOWING OCCUR: Excessive nausea (feeling sick to your stomach) and/or vomiting.  Severe abdominal pain and distention (swelling).  Trouble swallowing.  Temperature over 101 F (37.8 C).  Rectal bleeding or vomiting of blood.   I did not find any evidence of esophageal varices on your upper endoscopy today.  You do have portal hypertensive gastropathy in your stomach which is related to your  liver.  Small bowel appeared normal.  I discussed further with cardiology my findings, they have recommended Coumadin for your A-fib and will refer you to the Coumadin clinic.  Stop Plavix, do not need to take baby aspirin.  Continue on carvedilol.  Follow-up with me in 4 weeks.   I hope you have a great rest of your week!  Hennie Duos. Marletta Lor, D.O. Gastroenterology and Hepatology [facility]

## 2024-01-21 NOTE — Op Note (Signed)
 [facility] Patient Name: Mitchell Oliver Procedure Date: 01/21/2024 10:17 AM MRN: [id number] Date of Birth: [birth year] Attending MD: Hennie Duos. Marletta Lor , [place], [id number] CSN: [id number] Age: 78 Admit Type: Outpatient Procedure:                Upper GI endoscopy Indications:              Cirrhosis rule out esophageal varices Providers:                Hennie Duos. Marletta Lor, DO, Buel Ream. Thomasena Edis RN, RN,                            Pandora Leiter, Technician Referring MD:              Medicines:                See the Anesthesia note for documentation of the                            administered medications Complications:            No immediate complications. Estimated Blood Loss:     Estimated blood loss: none. Procedure:                Pre-Anesthesia Assessment:                           - The anesthesia plan was to use monitored                            anesthesia care (MAC).                           After obtaining informed consent, the endoscope was                            passed under direct vision. Throughout the                            procedure, the patient's blood pressure, pulse, and                            oxygen saturations were monitored continuously. The                            [id number]-H190 ([id number]) scope was introduced through the                            mouth, and advanced to the second part of duodenum.                            The upper GI endoscopy was accomplished without                            difficulty. The patient tolerated the procedure                            well.  Scope In: 10:35:41 AM Scope Out: 10:40:23 AM Total Procedure Duration: 0 hours 4 minutes 42 seconds  Findings:      A widely patent Schatzki ring was found in the distal esophagus.      There is no endoscopic evidence of varices in the entire esophagus.      Moderate portal hypertensive gastropathy was found in the entire       examined stomach.      The duodenal bulb,  first portion of the duodenum and second portion of       the duodenum were normal. Impression:               - Widely patent Schatzki ring.                           - Portal hypertensive gastropathy.                           - Normal duodenal bulb, first portion of the                            duodenum and second portion of the duodenum.                           - No specimens collected. Moderate Sedation:      Per Anesthesia Care Recommendation:           - Patient has a contact number available for                            emergencies. The signs and symptoms of potential                            delayed complications were discussed with the                            patient. Return to normal activities tomorrow.                            Written discharge instructions were provided to the                            patient.                           - Resume previous diet.                           - Continue present medications.                           - Repeat upper endoscopy in 2 years for screening                            purposes.                           - Return to GI clinic in 4 weeks.                           -  Discussed with cardiology. Plans to start on                            coumadin for AFIB. Stop Plavix. Does not need ASA.                            Cardiology will handle referral to coumadin clinic. Procedure Code(s):        --- Professional ---                           [phone], Esophagogastroduodenoscopy, flexible,                            transoral; diagnostic, including collection of                            specimen(s) by brushing or washing, when performed                            (separate procedure) Diagnosis Code(s):        --- Professional ---                           K22.2, Esophageal obstruction                           K76.6, Portal hypertension                           K31.89, Other diseases of stomach and duodenum                            K74.60, Unspecified cirrhosis of liver CPT copyright 2022 American Medical Association. All rights reserved. The codes documented in this report are preliminary and upon coder review may  be revised to meet current compliance requirements. Hennie Duos. Marletta Lor, DO Hennie Duos. Marletta Lor, DO 01/21/2024 10:44:47 AM This report has been signed electronically. Number of Addenda: 0

## 2024-01-21 NOTE — Interval H&P Note (Signed)
 History and Physical Interval Note:  01/21/2024 10:15 AM  Mitchell Oliver  has presented today for surgery, with the diagnosis of CIRRHOSIS, VARICEAL SCREENING.  The various methods of treatment have been discussed with the patient and family. After consideration of risks, benefits and other options for treatment, the patient has consented to  Procedure(s) with comments: EGD (ESOPHAGOGASTRODUODENOSCOPY) (N/A) - 11:45AM, ASA 3 as a surgical intervention.  The patient's history has been reviewed, patient examined, no change in status, stable for surgery.  I have reviewed the patient's chart and labs.  Questions were answered to the patient's satisfaction.     Lanelle Bal

## 2024-01-21 NOTE — Progress Notes (Signed)
 Patient had 2 areas of skin breakdown on his left arm on arrival.  Covered with gauze and tegaderm.  Also used paper tape on his iv site to help prevent skin tears upon removal of the iv.

## 2024-01-24 ENCOUNTER — Encounter (HOSPITAL_COMMUNITY): Payer: Self-pay | Admitting: Internal Medicine

## 2024-01-26 ENCOUNTER — Other Ambulatory Visit: Payer: Self-pay

## 2024-01-26 ENCOUNTER — Other Ambulatory Visit: Payer: Self-pay | Admitting: Family Medicine

## 2024-01-26 MED ORDER — BENAZEPRIL HCL 20 MG PO TABS: 20.0000 mg | ORAL_TABLET | Freq: Every day | ORAL | 2 refills | Status: DC

## 2024-01-31 ENCOUNTER — Ambulatory Visit: Attending: Internal Medicine | Admitting: *Deleted

## 2024-01-31 DIAGNOSIS — Z5181 Encounter for therapeutic drug level monitoring: Secondary | ICD-10-CM

## 2024-01-31 DIAGNOSIS — Z8673 Personal history of transient ischemic attack (TIA), and cerebral infarction without residual deficits: Secondary | ICD-10-CM | POA: Diagnosis not present

## 2024-01-31 DIAGNOSIS — I4891 Unspecified atrial fibrillation: Secondary | ICD-10-CM | POA: Diagnosis not present

## 2024-01-31 LAB — POCT INR: INR: 1.2 — AB (ref 2.0–3.0)

## 2024-01-31 MED ORDER — WARFARIN SODIUM 5 MG PO TABS: ORAL_TABLET | ORAL | 3 refills | Status: DC

## 2024-01-31 NOTE — Patient Instructions (Signed)
 Starting warfarin tonight. Take warfarin 1 tablet daily Recheck in 1 week Coumadin teaching done.

## 2024-02-07 ENCOUNTER — Encounter

## 2024-02-10 ENCOUNTER — Ambulatory Visit: Attending: Cardiology | Admitting: *Deleted

## 2024-02-10 DIAGNOSIS — Z5181 Encounter for therapeutic drug level monitoring: Secondary | ICD-10-CM | POA: Diagnosis not present

## 2024-02-10 DIAGNOSIS — Z8673 Personal history of transient ischemic attack (TIA), and cerebral infarction without residual deficits: Secondary | ICD-10-CM | POA: Diagnosis not present

## 2024-02-10 DIAGNOSIS — I4891 Unspecified atrial fibrillation: Secondary | ICD-10-CM

## 2024-02-10 LAB — POCT INR: INR: 2.9 (ref 2.0–3.0)

## 2024-02-10 NOTE — Patient Instructions (Signed)
 Continue warfarin 1 tablet daily Recheck in 1 week

## 2024-02-16 ENCOUNTER — Ambulatory Visit: Attending: Internal Medicine | Admitting: *Deleted

## 2024-02-16 DIAGNOSIS — I4891 Unspecified atrial fibrillation: Secondary | ICD-10-CM

## 2024-02-16 DIAGNOSIS — Z5181 Encounter for therapeutic drug level monitoring: Secondary | ICD-10-CM

## 2024-02-16 DIAGNOSIS — Z8673 Personal history of transient ischemic attack (TIA), and cerebral infarction without residual deficits: Secondary | ICD-10-CM | POA: Diagnosis not present

## 2024-02-16 LAB — POCT INR: INR: 3.8 — AB (ref 2.0–3.0)

## 2024-02-16 NOTE — Patient Instructions (Signed)
 Hold warfarin tonight then decrease dose to 1 tablet daily except 1/2 tablet on Tuesdays and Fridays Recheck in 10 days

## 2024-02-28 ENCOUNTER — Telehealth: Payer: Self-pay | Admitting: Cardiovascular Disease

## 2024-02-28 ENCOUNTER — Ambulatory Visit: Attending: Internal Medicine | Admitting: *Deleted

## 2024-02-28 DIAGNOSIS — Z5181 Encounter for therapeutic drug level monitoring: Secondary | ICD-10-CM

## 2024-02-28 DIAGNOSIS — Z8673 Personal history of transient ischemic attack (TIA), and cerebral infarction without residual deficits: Secondary | ICD-10-CM

## 2024-02-28 DIAGNOSIS — I4891 Unspecified atrial fibrillation: Secondary | ICD-10-CM

## 2024-02-28 LAB — POCT INR: INR: 2.6 (ref 2.0–3.0)

## 2024-02-28 NOTE — Telephone Encounter (Signed)
 Pt came into [name] office for an appt and was asking when he will hear back about his heart monitor results. Did advise pt depending on when the  monitor was sent in could play a factor into why he has not heard from us  but I would reach out and ask.

## 2024-02-28 NOTE — Patient Instructions (Signed)
 Continue warfarin 1 tablet daily except 1/2 tablet on Tuesdays and Fridays Recheck in 2 wks

## 2024-03-01 NOTE — Telephone Encounter (Signed)
 Left a detailed message on patients phone (DPR)

## 2024-03-07 ENCOUNTER — Telehealth: Payer: Self-pay | Admitting: Gastroenterology

## 2024-03-07 ENCOUNTER — Other Ambulatory Visit: Payer: Self-pay | Admitting: *Deleted

## 2024-03-07 DIAGNOSIS — R188 Other ascites: Secondary | ICD-10-CM

## 2024-03-07 NOTE — Telephone Encounter (Signed)
 noted

## 2024-03-07 NOTE — Telephone Encounter (Signed)
 Order's released

## 2024-03-07 NOTE — Telephone Encounter (Signed)
 I have ordered labs at Dr. Cherisse Cornell request. Patient to go Monday to [facility]. Can we just make sure they are released? Thanks!

## 2024-03-11 LAB — CBC WITH DIFFERENTIAL/PLATELET
Basophils Absolute: 0.1 10*3/uL (ref 0.0–0.2)
Basos: 1 %
EOS (ABSOLUTE): 0.2 10*3/uL (ref 0.0–0.4)
Eos: 3 %
Hematocrit: 43.7 % (ref 37.5–51.0)
Hemoglobin: 14.8 g/dL (ref 13.0–17.7)
Immature Grans (Abs): 0 10*3/uL (ref 0.0–0.1)
Immature Granulocytes: 0 %
Lymphocytes Absolute: 2.4 10*3/uL (ref 0.7–3.1)
Lymphs: 29 %
MCH: 32.6 pg (ref 26.6–33.0)
MCHC: 33.9 g/dL (ref 31.5–35.7)
MCV: 96 fL (ref 79–97)
Monocytes Absolute: 0.5 10*3/uL (ref 0.1–0.9)
Monocytes: 6 %
Neutrophils Absolute: 5.1 10*3/uL (ref 1.4–7.0)
Neutrophils: 61 %
Platelets: 162 10*3/uL (ref 150–450)
RBC: 4.54 x10E6/uL (ref 4.14–5.80)
RDW: 13.6 % (ref 11.6–15.4)
WBC: 8.3 10*3/uL (ref 3.4–10.8)

## 2024-03-11 LAB — COMPREHENSIVE METABOLIC PANEL WITH GFR
ALT: 32 IU/L (ref 0–44)
AST: 43 IU/L — ABNORMAL HIGH (ref 0–40)
Albumin: 3.1 g/dL — ABNORMAL LOW (ref 3.8–4.8)
Alkaline Phosphatase: 81 IU/L (ref 44–121)
BUN/Creatinine Ratio: 9 — ABNORMAL LOW (ref 10–24)
BUN: 12 mg/dL (ref 8–27)
Bilirubin Total: 3.4 mg/dL — ABNORMAL HIGH (ref 0.0–1.2)
CO2: 19 mmol/L — ABNORMAL LOW (ref 20–29)
Calcium: 9.3 mg/dL (ref 8.6–10.2)
Chloride: 102 mmol/L (ref 96–106)
Creatinine, Ser: 1.31 mg/dL — ABNORMAL HIGH (ref 0.76–1.27)
Globulin, Total: 3.6 g/dL (ref 1.5–4.5)
Glucose: 181 mg/dL — ABNORMAL HIGH (ref 70–99)
Potassium: 3.9 mmol/L (ref 3.5–5.2)
Sodium: 135 mmol/L (ref 134–144)
Total Protein: 6.7 g/dL (ref 6.0–8.5)
eGFR: 55 mL/min/{1.73_m2} — ABNORMAL LOW (ref >59)

## 2024-03-11 LAB — PROTIME-INR
INR: 2.3 — ABNORMAL HIGH (ref 0.9–1.2)
Prothrombin Time: 24.5 s — ABNORMAL HIGH (ref 9.1–12.0)

## 2024-03-14 ENCOUNTER — Ambulatory Visit: Payer: Self-pay | Admitting: Gastroenterology

## 2024-03-14 ENCOUNTER — Ambulatory Visit: Attending: Internal Medicine | Admitting: *Deleted

## 2024-03-14 DIAGNOSIS — I4891 Unspecified atrial fibrillation: Secondary | ICD-10-CM

## 2024-03-14 DIAGNOSIS — Z8673 Personal history of transient ischemic attack (TIA), and cerebral infarction without residual deficits: Secondary | ICD-10-CM | POA: Diagnosis not present

## 2024-03-14 DIAGNOSIS — Z5181 Encounter for therapeutic drug level monitoring: Secondary | ICD-10-CM

## 2024-03-14 LAB — POCT INR: INR: 2.6 (ref 2.0–3.0)

## 2024-03-14 NOTE — Patient Instructions (Signed)
 Continue warfarin 1 tablet daily except 1/2 tablet on Tuesdays and Fridays Recheck in 3 wks

## 2024-03-15 ENCOUNTER — Encounter: Payer: Self-pay | Admitting: Internal Medicine

## 2024-03-15 ENCOUNTER — Ambulatory Visit: Admitting: Internal Medicine

## 2024-03-15 VITALS — BP 99/60 | HR 64 | Temp 97.5°F | Ht 74.0 in | Wt 215.7 lb

## 2024-03-15 DIAGNOSIS — R7989 Other specified abnormal findings of blood chemistry: Secondary | ICD-10-CM

## 2024-03-15 DIAGNOSIS — K7460 Unspecified cirrhosis of liver: Secondary | ICD-10-CM | POA: Diagnosis not present

## 2024-03-15 DIAGNOSIS — K7682 Hepatic encephalopathy: Secondary | ICD-10-CM

## 2024-03-15 DIAGNOSIS — K766 Portal hypertension: Secondary | ICD-10-CM | POA: Diagnosis not present

## 2024-03-15 DIAGNOSIS — R350 Frequency of micturition: Secondary | ICD-10-CM

## 2024-03-15 DIAGNOSIS — R188 Other ascites: Secondary | ICD-10-CM | POA: Diagnosis not present

## 2024-03-15 DIAGNOSIS — M545 Low back pain, unspecified: Secondary | ICD-10-CM

## 2024-03-15 DIAGNOSIS — K862 Cyst of pancreas: Secondary | ICD-10-CM

## 2024-03-15 DIAGNOSIS — K219 Gastro-esophageal reflux disease without esophagitis: Secondary | ICD-10-CM

## 2024-03-15 NOTE — Progress Notes (Signed)
 Referring Provider: Cook, Jayce G, DO Primary Care Physician:  Cook, Jayce G, DO Primary GI:  Dr. Mordechai April  Chief Complaint  Patient presents with   Follow-up    Pt arrives for follow up. Pt states no issues at this time.     HPI:   Mitchell Oliver is a 78 y.o. male who presents to clinic today for follow-up visit.     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 Serological workup including acute hepatitis panel negative, iron  studies normal, ceruloplasmin negative, ANA positive though IgG normal, ASMA normal, AMA normal.  Liver biopsy 12/02/2023 with well-formed cirrhosis, portal histiocytes, etiology not apparent histologically.  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  Portal hypertension with ascites: Currently taking spironolactone  100 mg daily, Lasix  40 mg daily.  Has required paracentesis x 2 though none since February 2025.  Denies any abdominal distention for me today.  Has also cut back on all of the salt with his diet.  Was previously using a lot of table salt which he has since stopped.  Variceal screening: EGD 01/21/2024 without evidence of esophageal varices.  Hepatic encephalopathy: Mild, reports fatigue, mental fog.  Started on lactulose  which he states is helping.  Reports he is taking this twice a day.  Having a bowel movement 1-2 times a day.  Most recent blood work 03/10/2024 showed  T. bili 3.4, alk phos 81, AST 43, ALT 32.    Has also worked on his diet, drinking protein shakes 1-2 times daily.  IPMN:  Small, 4 mm, recommended surveillance imaging in 2 years.   Past Medical History:  Diagnosis Date   Allergy    Ascites    Cirrhosis (HCC)    COPD (chronic obstructive pulmonary disease) (HCC)    CVA (cerebral vascular accident) (HCC) 10/26/2010   denies residual on 07/22/2015   ED (erectile dysfunction)    Family history of adverse reaction to anesthesia    "think my mother had  real bad headaches after anesthesia"   GERD (gastroesophageal reflux disease)    Hypercholesterolemia    Hypertension    IFG (impaired fasting glucose)    NSTEMI (non-ST elevated myocardial infarction) (HCC)    Pneumonia ~ 2005    Past Surgical History:  Procedure Laterality Date   APPENDECTOMY  2009 [facility]   BACK SURGERY     BIOPSY  05/04/2014   Procedure: BIOPSY;  Surgeon: Alyce Jubilee, MD;  [facility];  Service: Endoscopy;;   CARDIAC CATHETERIZATION N/A 07/18/2015   Procedure: Left Heart Cath and Coronary Angiography;  Surgeon: Arty Binning, MD;  Location: [facility] INVASIVE CV LAB;  Service: Cardiovascular;  Laterality: N/A;   CARDIAC CATHETERIZATION N/A 07/18/2015   Procedure: Coronary Stent Intervention;  Surgeon: Arty Binning, MD;  Location: [facility] INVASIVE CV LAB;  Service: Cardiovascular;  Laterality: N/A;   CARDIAC CATHETERIZATION N/A 07/22/2015   Procedure: Coronary Stent Intervention;  Surgeon: Arty Binning, MD;  Location: [facility] INVASIVE CV LAB;  Service: Cardiovascular;  Laterality: N/A;   COLONOSCOPY  2009   COLONOSCOPY N/A 08/04/2019   Procedure: COLONOSCOPY;  Surgeon: Alyce Jubilee, MD;  [facility]: [facility];  Service: Endoscopy;  Laterality: N/A;  12:30   ESOPHAGOGASTRODUODENOSCOPY N/A 05/04/2014   Procedure: ESOPHAGOGASTRODUODENOSCOPY (EGD);  Surgeon: Alyce Jubilee, MD;  [facility]: [facility];  Service: Endoscopy;  Laterality: N/A;  9:15   ESOPHAGOGASTRODUODENOSCOPY N/A 01/21/2024   Procedure:  EGD (ESOPHAGOGASTRODUODENOSCOPY);  Surgeon: Vinetta Greening, DO;  Location: [facility];  Service: Endoscopy;  Laterality: N/A;  11:45AM, ASA 3   IR PARACENTESIS  12/02/2023   IR TRANSCATHETER BX  12/02/2023   POLYPECTOMY  08/04/2019   Procedure: POLYPECTOMY;  Surgeon: Alyce Jubilee, MD;  [facility]: [facility];  Service: Endoscopy;;  hepatic flexure, ascending colon, transverse colon, splenic flexure, descending colon    POSTERIOR LAMINECTOMY / DECOMPRESSION LUMBAR SPINE   06/03/2015   SAVORY DILATION N/A 05/04/2014   Procedure: SAVORY DILATION;  Surgeon: Alyce Jubilee, MD;  Location: [facility];  Service: Endoscopy;  Laterality: N/A;    Current Outpatient Medications  Medication Sig Dispense Refill   albuterol  (VENTOLIN  HFA) 108 (90 Base) MCG/ACT inhaler INHALE 2 PUFFS INTO THE LUNGS EVERY 6 HOURS AS NEEDED FOR WHEEZING OR SHORTNESS OF BREATH (Patient taking differently: Inhale 2 puffs into the lungs daily.) 8.5 g 1   benazepril  (LOTENSIN ) 20 MG tablet Take 1 tablet (20 mg total) by mouth daily. 30 tablet 2   carvedilol  (COREG ) 3.125 MG tablet Take 1 tablet (3.125 mg total) by mouth 2 (two) times daily with a meal. 60 tablet 5   dorzolamide -timolol  (COSOPT ) 22.3-6.8 MG/ML ophthalmic solution Place 1 drop into both eyes 2 (two) times daily.     furosemide  (LASIX ) 40 MG tablet Take 1 tablet (40 mg total) by mouth daily. 30 tablet 11   ipratropium (ATROVENT ) 0.06 % nasal spray Place 2 sprays into both nostrils 4 (four) times daily. 15 mL 0   Iron , Ferrous Sulfate , 325 (65 Fe) MG TABS Take 325 mg by mouth every Tuesday, Thursday, Saturday, and Sunday. 45 tablet 1   JARDIANCE  10 MG TABS tablet TAKE 1 TABLET(10 MG) BY MOUTH DAILY 90 tablet 1   lactulose  (CHRONULAC ) 10 GM/15ML solution Take 30 mLs (20 g total) by mouth 2 (two) times daily. 473 mL 3   latanoprost  (XALATAN ) 0.005 % ophthalmic solution Place 1 drop into both eyes at bedtime.     ondansetron  (ZOFRAN ) 4 MG tablet Take 1 tablet (4 mg total) by mouth every 6 (six) hours as needed for nausea. 20 tablet 0   pantoprazole  (PROTONIX ) 40 MG tablet Take 1 tablet (40 mg total) by mouth daily. 90 tablet 3   spironolactone  (ALDACTONE ) 100 MG tablet Take 1 tablet (100 mg total) by mouth daily. 30 tablet 11   warfarin (COUMADIN ) 5 MG tablet Take 1 to 1 1/2 tablets daily or as directed 45 tablet 3   No current facility-administered medications for this visit.    Allergies as of 03/15/2024 - Review Complete 03/15/2024   Allergen Reaction Noted   Bee venom Anaphylaxis 12/10/2023   Shellfish allergy Anaphylaxis 12/10/2023   Lidocaine  Hives and Rash 10/04/2023    Family History  Problem Relation Age of Onset   Hypertension Mother    Hypertension Father    Heart attack Father    Stroke Father    Colon cancer Neg Hx    Colon polyps Neg Hx     Social History   Socioeconomic History   Marital status: Divorced    Spouse name: Not on file   Number of children: 3   Years of education: Not on file   Highest education level: Some college, no degree  Occupational History   Occupation: Airline pilot  Tobacco Use   Smoking status: Former    Current packs/day: 0.00    Average packs/day: 2.0 packs/day for 24.2 years (48.3 ttl pk-yrs)  Types: Cigarettes    Start date: 63    Quit date: 12/24/1985    Years since quitting: 38.2   Smokeless tobacco: Never  Vaping Use   Vaping status: Never Used  Substance and Sexual Activity   Alcohol use: Yes    Alcohol/week: 6.0 standard drinks of alcohol    Types: 2 Glasses of wine, 4 Cans of beer per week   Drug use: No   Sexual activity: Yes  Other Topics Concern   Not on file  Social History Narrative   3 daughters, 2 live locally, one lives in [place].   3 grandsons   Active in grandson's travel ball. Works part time for [employer].    Social Drivers of Corporate investment banker Strain: Low Risk  (10/18/2023)   Overall Financial Resource Strain (CARDIA)    Difficulty of Paying Living Expenses: Not very hard  Food Insecurity: No Food Insecurity (01/19/2024)   Hunger Vital Sign    Worried About Running Out of Food in the Last Year: Never true    Ran Out of Food in the Last Year: Never true  Transportation Needs: No Transportation Needs (01/19/2024)   PRAPARE - Administrator, Civil Service (Medical): No    Lack of Transportation (Non-Medical): No  Physical Activity: Unknown (10/18/2023)   Exercise Vital Sign    Days of Exercise per  Week: 0 days    Minutes of Exercise per Session: Not on file  Recent Concern: Physical Activity - Inactive (10/18/2023)   Exercise Vital Sign    Days of Exercise per Week: 0 days    Minutes of Exercise per Session: 60 min  Stress: No Stress Concern Present (10/18/2023)   [employer] of Occupational Health - Occupational Stress Questionnaire    Feeling of Stress : Not at all  Social Connections: Moderately Integrated (01/19/2024)   Social Connection and Isolation Panel [NHANES]    Frequency of Communication with Friends and Family: More than three times a week    Frequency of Social Gatherings with Friends and Family: Twice a week    Attends Religious Services: More than 4 times per year    Active Member of [employer] or Organizations: Yes    Attends Engineer, structural: More than 4 times per year    Marital Status: Divorced    Subjective: Review of Systems  Constitutional:  Negative for chills and fever.  HENT:  Negative for congestion and hearing loss.   Eyes:  Negative for blurred vision and double vision.  Respiratory:  Negative for cough and shortness of breath.   Cardiovascular:  Negative for chest pain and palpitations.  Gastrointestinal:  Negative for abdominal pain, blood in stool, constipation, diarrhea, heartburn, melena and vomiting.  Genitourinary:  Negative for dysuria and urgency.  Musculoskeletal:  Negative for joint pain and myalgias.  Skin:  Negative for itching and rash.  Neurological:  Negative for dizziness and headaches.  Psychiatric/Behavioral:  Negative for depression. The patient is not nervous/anxious.      Objective: BP 99/60   Pulse 64   Temp (!) 97.5 F (36.4 C)   Ht 6\' 2"  (1.88 m)   Wt 215 lb 11.2 oz (97.8 kg)   BMI 27.69 kg/m  Physical Exam Constitutional:      Appearance: Normal appearance.  HENT:     Head: Normocephalic and atraumatic.  Eyes:     General: Scleral icterus present.     Extraocular Movements: Extraocular  movements intact.  Conjunctiva/sclera: Conjunctivae normal.  Cardiovascular:     Rate and Rhythm: Normal rate and regular rhythm.  Pulmonary:     Effort: Pulmonary effort is normal.     Breath sounds: Normal breath sounds.  Abdominal:     General: Bowel sounds are normal. There is distension.     Palpations: Abdomen is soft.  Musculoskeletal:        General: Normal range of motion.     Cervical back: Normal range of motion and neck supple.  Skin:    General: Skin is warm.  Neurological:     General: No focal deficit present.     Mental Status: He is alert and oriented to person, place, and time.  Psychiatric:        Mood and Affect: Mood normal.        Behavior: Behavior normal.      Assessment/Plan:   1.  Cirrhosis-likely secondary to [facility], serologic workup as above.  Avoid all alcohol going forward.  CPT C, MELD unreliable given patient on Coumadin .  Portal hypertension with ascites: Continue Lasix  40 mg daily, spironolactone  100 mg daily.  Monitor daily weights and BP.  Creatinine bumped up to 1.3.  Continue to monitor.  Nutrition recommendations:  High-protein diet from a primarily plant-based diet. Avoid red meat.  No raw or undercooked meat, seafood, or shellfish. Low-fat/cholesterol/carbohydrate diet. Limit sodium to no more than 2000 mg/day including everything that you eat and drink. Recommend at least 30 minutes of aerobic and resistance exercise 3 days/week.  Hepatic encephalopathy: Improved with lactulose .  Counseled that the goal is to have at least 3 loose bowel movements a day.   Continue to follow-up with [facility] with [facility].   2.  IPMN-small, 4 mm, recommended surveillance imaging in 2 years.  3.  Chronic GERD-pantoprazole  stopped previously due to increased risk of SBP.  Patient states he was absolutely miserable during this time period, unable to sleep due to his reflux symptoms.  Restarted on his own.  Discussed increased risk in relation to  PPI and he understands.  4.  Urinary frequency-likely due to diuretic use.  Patient requesting PSA check as well as A1c.  Will forward results to his PCP.  5.  Low back pain-patient's main complaint today.  Continue Tylenol .  Avoid NSAIDs.  Recommend he follow-up with his PCP in this regard.  Follow-up in 2 to 3 months.   03/15/2024 10:54 AM   Disclaimer: This note was dictated with voice recognition software. Similar sounding words can inadvertently be transcribed and may not be corrected upon review.

## 2024-03-15 NOTE — Patient Instructions (Signed)
  I am happy to hear that you are doing well.   I am going to check PSA and A1c at [facility] lab today.    Continue to avoid salt in your diet.  Recommended max of 2000 mg daily of sodium.   Continue on lactulose  twice daily goal of 3-4 loose bowel movements   Continue Lasix  and spironolactone .   Follow-up in 2-3 months   It was very nice seeing both you again today.   Dr. Mordechai April

## 2024-03-16 LAB — HEMOGLOBIN A1C
Hgb A1c MFr Bld: 5.5 % (ref <5.7)
Mean Plasma Glucose: 111 mg/dL
eAG (mmol/L): 6.2 mmol/L

## 2024-03-16 LAB — PSA: PSA: 0.29 ng/mL (ref <=4.00)

## 2024-03-20 ENCOUNTER — Ambulatory Visit: Payer: Self-pay | Admitting: Internal Medicine

## 2024-03-28 LAB — HM DIABETES EYE EXAM

## 2024-04-03 DIAGNOSIS — I4891 Unspecified atrial fibrillation: Secondary | ICD-10-CM

## 2024-04-04 ENCOUNTER — Encounter: Payer: Self-pay | Admitting: Internal Medicine

## 2024-04-04 ENCOUNTER — Ambulatory Visit: Attending: Internal Medicine | Admitting: *Deleted

## 2024-04-04 DIAGNOSIS — Z8673 Personal history of transient ischemic attack (TIA), and cerebral infarction without residual deficits: Secondary | ICD-10-CM

## 2024-04-04 DIAGNOSIS — Z5181 Encounter for therapeutic drug level monitoring: Secondary | ICD-10-CM | POA: Diagnosis not present

## 2024-04-04 DIAGNOSIS — I4891 Unspecified atrial fibrillation: Secondary | ICD-10-CM | POA: Diagnosis not present

## 2024-04-04 LAB — POCT INR: INR: 2.9 (ref 2.0–3.0)

## 2024-04-04 NOTE — Patient Instructions (Signed)
Continue warfarin 1 tablet daily except 1/2 tablet on Tuesdays and Fridays Recheck in 4 wks 

## 2024-04-10 ENCOUNTER — Other Ambulatory Visit: Payer: Self-pay | Admitting: Family Medicine

## 2024-04-10 ENCOUNTER — Ambulatory Visit: Payer: Self-pay | Admitting: Internal Medicine

## 2024-04-12 ENCOUNTER — Other Ambulatory Visit (HOSPITAL_COMMUNITY): Payer: Self-pay | Admitting: Nurse Practitioner

## 2024-04-12 DIAGNOSIS — K7469 Other cirrhosis of liver: Secondary | ICD-10-CM

## 2024-04-20 ENCOUNTER — Ambulatory Visit (HOSPITAL_COMMUNITY)
Admission: RE | Admit: 2024-04-20 | Discharge: 2024-04-20 | Disposition: A | Discharge status: Home / Self Care | Source: Ambulatory Visit | Attending: Nurse Practitioner | Admitting: Nurse Practitioner

## 2024-04-20 ENCOUNTER — Other Ambulatory Visit: Payer: Self-pay | Admitting: Family Medicine

## 2024-04-20 DIAGNOSIS — K7469 Other cirrhosis of liver: Secondary | ICD-10-CM | POA: Insufficient documentation

## 2024-04-21 ENCOUNTER — Other Ambulatory Visit: Payer: Self-pay

## 2024-04-21 MED ORDER — BENAZEPRIL HCL 20 MG PO TABS: 20.0000 mg | ORAL_TABLET | Freq: Every day | ORAL | 2 refills | Status: DC

## 2024-05-02 ENCOUNTER — Encounter

## 2024-05-04 ENCOUNTER — Other Ambulatory Visit: Payer: Self-pay | Admitting: Internal Medicine

## 2024-05-04 DIAGNOSIS — I4891 Unspecified atrial fibrillation: Secondary | ICD-10-CM

## 2024-05-04 NOTE — Telephone Encounter (Signed)
 Prescription refill request received for warfarin [place]: 11/26/23 ([facility])  Next INR check: 05/02/24 Warfarin tablet strength: 5mg   Appropriate dose. Refill sent.

## 2024-05-08 ENCOUNTER — Ambulatory Visit: Attending: Internal Medicine | Admitting: *Deleted

## 2024-05-08 ENCOUNTER — Encounter

## 2024-05-08 DIAGNOSIS — I4891 Unspecified atrial fibrillation: Secondary | ICD-10-CM | POA: Diagnosis not present

## 2024-05-08 DIAGNOSIS — Z5181 Encounter for therapeutic drug level monitoring: Secondary | ICD-10-CM

## 2024-05-08 DIAGNOSIS — Z8673 Personal history of transient ischemic attack (TIA), and cerebral infarction without residual deficits: Secondary | ICD-10-CM

## 2024-05-08 LAB — POCT INR: INR: 2 (ref 2.0–3.0)

## 2024-05-08 NOTE — Patient Instructions (Signed)
Continue warfarin 1 tablet daily except 1/2 tablet on Tuesdays and Fridays Recheck in 4 wks 

## 2024-05-08 NOTE — Progress Notes (Signed)
Please see anticoagulation encounter.

## 2024-05-10 ENCOUNTER — Other Ambulatory Visit: Payer: Self-pay | Admitting: Family Medicine

## 2024-05-10 ENCOUNTER — Other Ambulatory Visit: Payer: Self-pay

## 2024-05-10 MED ORDER — EMPAGLIFLOZIN 10 MG PO TABS: 10.0000 mg | ORAL_TABLET | Freq: Every day | ORAL | 1 refills | Status: DC

## 2024-05-19 ENCOUNTER — Encounter: Payer: Self-pay | Admitting: Internal Medicine

## 2024-05-22 NOTE — Telephone Encounter (Signed)
 I called the patient's daughter again. She spoke to him over the weekend, but she has not laid eyes on him. He is telling her the swelling is not too bad (abdomen or legs). He told her he has been taking his diuretics correctly.   She plans to go to his house today so she can visualize his swelling. She had him weigh himself today. He is 220lbs.  She will have him get labs today. She will let us  know about the paracentesis order after she sees him.  Also, she wanted Dawn to know that he has an appointment with his GI provider, Dr. Cindie, on Thursday (05/25/24).

## 2024-05-25 ENCOUNTER — Telehealth: Payer: Self-pay | Admitting: Internal Medicine

## 2024-05-25 ENCOUNTER — Ambulatory Visit: Admitting: Internal Medicine

## 2024-05-25 VITALS — BP 121/69 | HR 47 | Temp 98.6°F | Ht 74.0 in | Wt 224.6 lb

## 2024-05-25 DIAGNOSIS — K7460 Unspecified cirrhosis of liver: Secondary | ICD-10-CM | POA: Diagnosis not present

## 2024-05-25 DIAGNOSIS — R7989 Other specified abnormal findings of blood chemistry: Secondary | ICD-10-CM

## 2024-05-25 DIAGNOSIS — K219 Gastro-esophageal reflux disease without esophagitis: Secondary | ICD-10-CM

## 2024-05-25 DIAGNOSIS — K862 Cyst of pancreas: Secondary | ICD-10-CM

## 2024-05-25 DIAGNOSIS — K766 Portal hypertension: Secondary | ICD-10-CM | POA: Diagnosis not present

## 2024-05-25 DIAGNOSIS — R188 Other ascites: Secondary | ICD-10-CM

## 2024-05-25 DIAGNOSIS — K7682 Hepatic encephalopathy: Secondary | ICD-10-CM | POA: Diagnosis not present

## 2024-05-25 NOTE — Telephone Encounter (Signed)
 Pt daughter called in asking if Dr. Mallipeddi can review pt notes from Gastro appts today and give any recommendations

## 2024-05-25 NOTE — Patient Instructions (Addendum)
 I am happy to hear that you are doing well.   Continue to avoid salt in your diet.  Recommended max of 2000 mg daily of sodium.   Continue on lactulose  twice daily goal of 3-4 loose bowel movements   Continue furosemide  and eplerenone    Follow-up in 3 to 4 months.   It was very nice seeing both you again today.   Dr. Cindie

## 2024-05-25 NOTE — Progress Notes (Signed)
 Referring Provider: Mallipeddi, Vishnu P, MD Primary Care Physician:  Mallipeddi, Vishnu P, MD Primary GI:  Dr. Cindie  Chief Complaint  Patient presents with   Follow-up    Follow up on urinary frequency and cirrhosis. Pt states he is doing great    HPI:   Mitchell Oliver is a 78 y.o. male who presents to clinic today for follow-up visit.     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 Serological workup including acute hepatitis panel negative, iron  studies normal, ceruloplasmin negative, ANA positive though IgG normal, ASMA normal, AMA normal.  Liver biopsy 12/02/2023 with well-formed cirrhosis, portal histiocytes, etiology not apparent histologically.  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  Portal hypertension with ascites: Currently taking eplerenone 50 mg daily (spironolactone  caused breast tenderness), furosemide  20 mg daily.  Has required paracentesis x 2 though none since February 2025.  Denies any abdominal distention for me today.  Has also cut back on all of the salt with his diet.  Was previously using a lot of table salt which he has since stopped.  Variceal screening: EGD 01/21/2024 without evidence of esophageal varices.  Hepatic encephalopathy: Mild, reports fatigue, mental fog.  Started on lactulose  which he states is helping.  Reports he is taking this twice a day.  Having a bowel movement 1-2 times a day.  HCC screening: Ultrasound 04/20/2024 without hepatoma.  Recent AFP 05/22/2024 mildly elevated at 11.9.  IPMN:  Small, 4 mm, recommended surveillance imaging in 2 years.   Past Medical History:  Diagnosis Date   Allergy    Ascites    Cirrhosis (HCC)    COPD (chronic obstructive pulmonary disease) (HCC)    CVA (cerebral vascular accident) (HCC) 10/26/2010   denies residual on 07/22/2015   ED (erectile dysfunction)    Family history of adverse reaction to anesthesia    think my mother had real bad  headaches after anesthesia   GERD (gastroesophageal reflux disease)    Hypercholesterolemia    Hypertension    IFG (impaired fasting glucose)    NSTEMI (non-ST elevated myocardial infarction) (HCC)    Pneumonia ~ 2005    Past Surgical History:  Procedure Laterality Date   APPENDECTOMY  2009 [facility]   BACK SURGERY     BIOPSY  05/04/2014   Procedure: BIOPSY;  Surgeon: Margo LITTIE Haddock, MD;  [facility];  Service: Endoscopy;;   CARDIAC CATHETERIZATION N/A 07/18/2015   Procedure: Left Heart Cath and Coronary Angiography;  Surgeon: Victory LELON Sharps, MD;  Location: [facility] INVASIVE CV LAB;  Service: Cardiovascular;  Laterality: N/A;   CARDIAC CATHETERIZATION N/A 07/18/2015   Procedure: Coronary Stent Intervention;  Surgeon: Victory LELON Sharps, MD;  Location: [facility] INVASIVE CV LAB;  Service: Cardiovascular;  Laterality: N/A;   CARDIAC CATHETERIZATION N/A 07/22/2015   Procedure: Coronary Stent Intervention;  Surgeon: Victory LELON Sharps, MD;  Location: [facility] INVASIVE CV LAB;  Service: Cardiovascular;  Laterality: N/A;   COLONOSCOPY  2009   COLONOSCOPY N/A 08/04/2019   Procedure: COLONOSCOPY;  Surgeon: Haddock Margo LITTIE, MD;  [facility]: [facility];  Service: Endoscopy;  Laterality: N/A;  12:30   ESOPHAGOGASTRODUODENOSCOPY N/A 05/04/2014   Procedure: ESOPHAGOGASTRODUODENOSCOPY (EGD);  Surgeon: Margo LITTIE Haddock, MD;  [facility];  Service: Endoscopy;  Laterality: N/A;  9:15   ESOPHAGOGASTRODUODENOSCOPY N/A 01/21/2024   Procedure: EGD (ESOPHAGOGASTRODUODENOSCOPY);  Surgeon: Cindie Carlin POUR, DO;  Location: [facility];  Service: Endoscopy;  Laterality:  N/A;  11:45AM, ASA 3   IR PARACENTESIS  12/02/2023   IR TRANSCATHETER BX  12/02/2023   POLYPECTOMY  08/04/2019   Procedure: POLYPECTOMY;  Surgeon: Harvey Margo CROME, MD;  [facility]: [facility];  Service: Endoscopy;;  hepatic flexure, ascending colon, transverse colon, splenic flexure, descending colon    POSTERIOR LAMINECTOMY / DECOMPRESSION LUMBAR SPINE   06/03/2015   SAVORY DILATION N/A 05/04/2014   Procedure: SAVORY DILATION;  Surgeon: Margo CROME Harvey, MD;  Location: [facility];  Service: Endoscopy;  Laterality: N/A;    Current Outpatient Medications  Medication Sig Dispense Refill   albuterol  (VENTOLIN  HFA) 108 (90 Base) MCG/ACT inhaler INHALE 2 PUFFS INTO THE LUNGS EVERY 6 HOURS AS NEEDED FOR WHEEZING OR SHORTNESS OF BREATH 8.5 g 1   benazepril  (LOTENSIN ) 20 MG tablet Take 1 tablet (20 mg total) by mouth daily. 30 tablet 2   carvedilol  (COREG ) 3.125 MG tablet Take 1 tablet (3.125 mg total) by mouth 2 (two) times daily with a meal. 60 tablet 5   dorzolamide -timolol  (COSOPT ) 22.3-6.8 MG/ML ophthalmic solution Place 1 drop into both eyes 2 (two) times daily.     empagliflozin  (JARDIANCE ) 10 MG TABS tablet Take 1 tablet (10 mg total) by mouth daily. 90 tablet 1   eplerenone (INSPRA) 25 MG tablet Take 25 mg by mouth daily.     furosemide  (LASIX ) 40 MG tablet Take 1 tablet (40 mg total) by mouth daily. (Patient taking differently: Take 20 mg by mouth daily.) 30 tablet 11   ipratropium (ATROVENT ) 0.06 % nasal spray Place 2 sprays into both nostrils 4 (four) times daily. 15 mL 0   Iron , Ferrous Sulfate , 325 (65 Fe) MG TABS Take 325 mg by mouth every Tuesday, Thursday, Saturday, and Sunday. 45 tablet 1   lactulose  (CHRONULAC ) 10 GM/15ML solution Take 30 mLs (20 g total) by mouth 2 (two) times daily. 473 mL 3   latanoprost  (XALATAN ) 0.005 % ophthalmic solution Place 1 drop into both eyes at bedtime.     ondansetron  (ZOFRAN ) 4 MG tablet Take 1 tablet (4 mg total) by mouth every 6 (six) hours as needed for nausea. 20 tablet 0   pantoprazole  (PROTONIX ) 40 MG tablet Take 1 tablet (40 mg total) by mouth daily. 90 tablet 3   warfarin (COUMADIN ) 5 MG tablet TAKE 1 TABLET TO 1 AND 1/2 TABLETS BY MOUTH DAILY AS DIRECTED BY COUMADIN  CLINIC 75 tablet 0   zinc  gluconate 50 MG tablet Take 50 mg by mouth daily.     spironolactone  (ALDACTONE ) 100 MG tablet Take 1  tablet (100 mg total) by mouth daily. (Patient not taking: Reported on 05/25/2024) 30 tablet 11   No current facility-administered medications for this visit.    Allergies as of 05/25/2024 - Review Complete 05/25/2024  Allergen Reaction Noted   Bee venom Anaphylaxis 12/10/2023   Shellfish allergy Anaphylaxis 12/10/2023   Lidocaine  Hives and Rash 10/04/2023    Family History  Problem Relation Age of Onset   Hypertension Mother    Hypertension Father    Heart attack Father    Stroke Father    Colon cancer Neg Hx    Colon polyps Neg Hx     Social History   Socioeconomic History   Marital status: Divorced    Spouse name: Not on file   Number of children: 3   Years of education: Not on file   Highest education level: Some college, no degree  Occupational History   Occupation: Airline pilot  Tobacco  Use   Smoking status: Former    Current packs/day: 0.00    Average packs/day: 2.0 packs/day for 24.2 years (48.3 ttl pk-yrs)    Types: Cigarettes    Start date: 3    Quit date: 12/24/1985    Years since quitting: 38.4   Smokeless tobacco: Never  Vaping Use   Vaping status: Never Used  Substance and Sexual Activity   Alcohol use: Yes    Alcohol/week: 6.0 standard drinks of alcohol    Types: 2 Glasses of wine, 4 Cans of beer per week   Drug use: No   Sexual activity: Yes  Other Topics Concern   Not on file  Social History Narrative   3 daughters, 2 live locally, one lives in [place].   3 grandsons   Active in grandson's travel ball. Works part time for [employer].    Social Drivers of Corporate investment banker Strain: Low Risk  (10/18/2023)   Overall Financial Resource Strain (CARDIA)    Difficulty of Paying Living Expenses: Not very hard  Food Insecurity: No Food Insecurity (01/19/2024)   Hunger Vital Sign    Worried About Running Out of Food in the Last Year: Never true    Ran Out of Food in the Last Year: Never true  Transportation Needs: No Transportation  Needs (01/19/2024)   PRAPARE - Administrator, Civil Service (Medical): No    Lack of Transportation (Non-Medical): No  Physical Activity: Unknown (10/18/2023)   Exercise Vital Sign    Days of Exercise per Week: 0 days    Minutes of Exercise per Session: Not on file  Recent Concern: Physical Activity - Inactive (10/18/2023)   Exercise Vital Sign    Days of Exercise per Week: 0 days    Minutes of Exercise per Session: 60 min  Stress: No Stress Concern Present (10/18/2023)   [employer] of Occupational Health - Occupational Stress Questionnaire    Feeling of Stress : Not at all  Social Connections: Moderately Integrated (01/19/2024)   Social Connection and Isolation Panel    Frequency of Communication with Friends and Family: More than three times a week    Frequency of Social Gatherings with Friends and Family: Twice a week    Attends Religious Services: More than 4 times per year    Active Member of [employer] or Organizations: Yes    Attends Engineer, structural: More than 4 times per year    Marital Status: Divorced    Subjective: Review of Systems  Constitutional:  Negative for chills and fever.  HENT:  Negative for congestion and hearing loss.   Eyes:  Negative for blurred vision and double vision.  Respiratory:  Negative for cough and shortness of breath.   Cardiovascular:  Negative for chest pain and palpitations.  Gastrointestinal:  Negative for abdominal pain, blood in stool, constipation, diarrhea, heartburn, melena and vomiting.  Genitourinary:  Negative for dysuria and urgency.  Musculoskeletal:  Negative for joint pain and myalgias.  Skin:  Negative for itching and rash.  Neurological:  Negative for dizziness and headaches.  Psychiatric/Behavioral:  Negative for depression. The patient is not nervous/anxious.      Objective: BP 121/69   Pulse (!) 47   Temp 98.6 F (37 C)   Ht 6' 2 (1.88 m)   Wt 224 lb 9.6 oz (101.9 kg)   BMI 28.84 kg/m   Physical Exam Constitutional:      Appearance: Normal appearance.  HENT:  Head: Normocephalic and atraumatic.  Eyes:     General: Scleral icterus present.     Extraocular Movements: Extraocular movements intact.     Conjunctiva/sclera: Conjunctivae normal.  Cardiovascular:     Rate and Rhythm: Normal rate and regular rhythm.  Pulmonary:     Effort: Pulmonary effort is normal.     Breath sounds: Normal breath sounds.  Abdominal:     General: Bowel sounds are normal. There is distension.     Palpations: Abdomen is soft.  Musculoskeletal:        General: Normal range of motion.     Cervical back: Normal range of motion and neck supple.  Skin:    General: Skin is warm.  Neurological:     General: No focal deficit present.     Mental Status: He is alert and oriented to person, place, and time.  Psychiatric:        Mood and Affect: Mood normal.        Behavior: Behavior normal.      Assessment/Plan:   1.  Cirrhosis-likely secondary to [facility], serologic workup as above.  Avoid all alcohol going forward.  CPT C, MELD unreliable given patient on Coumadin .  Portal hypertension with ascites: Continue eplerenone 50 mg daily, furosemide  20 mg daily.  Monitor daily weights and BP.  Creatinine stable.  Continue to monitor.  Nutrition recommendations:  High-protein diet from a primarily plant-based diet. Avoid red meat.  No raw or undercooked meat, seafood, or shellfish. Low-fat/cholesterol/carbohydrate diet. Limit sodium to no more than 2000 mg/day including everything that you eat and drink. Recommend at least 30 minutes of aerobic and resistance exercise 3 days/week.  Hepatic encephalopathy: Improved with lactulose .  Counseled that the goal is to have at least 3 loose bowel movements a day.   Continue to follow-up with [facility] with [facility].   HCC screening: Due for repeat ultrasound 09/2024.  2.  IPMN-small, 4 mm, recommended surveillance imaging in 2 years.  3.   Chronic GERD-pantoprazole  stopped previously due to increased risk of SBP.  Patient states he was absolutely miserable during this time period, unable to sleep due to his reflux symptoms.  Restarted on his own.  Discussed increased risk in relation to PPI and he understands.  Follow-up in 3 to 4 months   05/25/2024 1:58 PM   Disclaimer: This note was dictated with voice recognition software. Similar sounding words can inadvertently be transcribed and may not be corrected upon review.

## 2024-05-29 NOTE — Telephone Encounter (Signed)
 Daughter contacted to clarify what was needed. Per daughter Nat Perry, patient seen by GI and was advised to contact office regarding irregular HR and rhythm. Also was told that per heart monitor result, it was recommended that patient follow up with Mallipeddi in 2 weeks. Appointment given to see Mallipeddi 06/02/2024 @9 :20 am [facility] office. Daughter said she would confirm by the end of day if patient can make this appointment and if not he would keep 06/29/2024 appointment already scheduled.  Verbalized understanding.

## 2024-06-02 ENCOUNTER — Ambulatory Visit: Admitting: Internal Medicine

## 2024-06-05 ENCOUNTER — Ambulatory Visit: Attending: Internal Medicine | Admitting: *Deleted

## 2024-06-05 DIAGNOSIS — Z5181 Encounter for therapeutic drug level monitoring: Secondary | ICD-10-CM | POA: Diagnosis not present

## 2024-06-05 DIAGNOSIS — Z8673 Personal history of transient ischemic attack (TIA), and cerebral infarction without residual deficits: Secondary | ICD-10-CM | POA: Diagnosis not present

## 2024-06-05 DIAGNOSIS — I4891 Unspecified atrial fibrillation: Secondary | ICD-10-CM

## 2024-06-05 LAB — POCT INR: INR: 2.9 (ref 2.0–3.0)

## 2024-06-05 NOTE — Patient Instructions (Signed)
Continue warfarin 1 tablet daily except 1/2 tablet on Tuesdays and Fridays Recheck in 4 wks 

## 2024-06-05 NOTE — Progress Notes (Signed)
 INR 2.9; Please see anticoagulation encounter

## 2024-06-12 ENCOUNTER — Telehealth: Payer: Self-pay

## 2024-06-12 ENCOUNTER — Encounter: Payer: Self-pay | Admitting: Internal Medicine

## 2024-06-12 NOTE — Telephone Encounter (Signed)
 Attempted to return the pt's call X 3 and my call would not go through. Looked in the pt's MyChart and I seen message regarding his symptoms sent to the pt's cardiologists

## 2024-06-13 ENCOUNTER — Ambulatory Visit: Payer: Self-pay

## 2024-06-13 ENCOUNTER — Encounter: Payer: Self-pay | Admitting: Family Medicine

## 2024-06-13 NOTE — Telephone Encounter (Signed)
 FYI Only or Action Required?: FYI only for provider.  Patient was last seen in primary care on 01/04/2024 by Cook, Jayce G, DO.  Called Nurse Triage reporting Shortness of Breath.  Symptoms began a week ago.  Interventions attempted: Other: daughter states has tried to encourage patient with eating but patient is eating and drinking very little.  Symptoms are: gradually worsening.  Triage Disposition: Go to ED Now (or PCP Triage)  Patient/caregiver understands and will follow disposition?: Yes  Copied from CRM #[id number]. Topic: Clinical - Red Word Triage >> Jun 13, 2024 10:45 AM Turkey B wrote: [facility] that prompted transfer to Nurse Triage: Patient has SOB, low energy and sleeping a lot Reason for Disposition  Patient sounds very sick or weak to the triager  Answer Assessment - Initial Assessment Questions 1. RESPIRATORY STATUS: Describe your breathing? (e.g., wheezing, shortness of breath, unable to speak, severe coughing)      Shortness of breath with ambulation or any activity 2. ONSET: When did this breathing problem begin?      Daughter is unsure of how long the SOB has been going on but she believes it has been going on for a week.  3. PATTERN Does the difficult breathing come and go, or has it been constant since it started?      constant 4. SEVERITY: How bad is your breathing? (e.g., mild, moderate, severe)      Mild to moderate 5. RECURRENT SYMPTOM: Have you had difficulty breathing before? If Yes, ask: When was the last time? and What happened that time?      no 6. CARDIAC HISTORY: Do you have any history of heart disease? (e.g., heart attack, angina, bypass surgery, angioplasty)      MI, a fib, HTN,  7. LUNG HISTORY: Do you have any history of lung disease?  (e.g., pulmonary embolus, asthma, emphysema)     Hx of smoking 8. CAUSE: What do you think is causing the breathing problem?      unsure 9. OTHER SYMPTOMS: Do you have any other symptoms?  (e.g., chest pain, cough, dizziness, fever, runny nose)     Daughter states patient has low energy, no appetite and is sleeping a lot 10. O2 SATURATION MONITOR:  Do you use an oxygen saturation monitor (pulse oximeter) at home? If Yes, ask: What is your reading (oxygen level) today? What is your usual oxygen saturation reading? (e.g., 95%)       95% yesterday while the daughter was on the phone with him 12. TRAVEL: Have you traveled out of the country in the last month? (e.g., travel history, exposures)       No  Daughter reports patient is eating very little and sleeping a lot through the day and sleeping at night. Daughter states she has noticed patient's skin looking darker-concerned with jaundice. Recommended to the emergency Department. Daughter to call patient's girlfriend to get patient to the Emergency Department. Patient may need a call from PCP about going to the ED-daughter is concerned patient may want to refuse.  Protocols used: Breathing Difficulty-A-AH

## 2024-06-14 ENCOUNTER — Ambulatory Visit: Admitting: Family Medicine

## 2024-06-14 ENCOUNTER — Encounter: Payer: Self-pay | Admitting: Family Medicine

## 2024-06-14 VITALS — BP 142/72 | HR 47 | Temp 97.1°F | Ht 74.0 in | Wt 215.0 lb

## 2024-06-14 DIAGNOSIS — D509 Iron deficiency anemia, unspecified: Secondary | ICD-10-CM | POA: Diagnosis not present

## 2024-06-14 DIAGNOSIS — K7460 Unspecified cirrhosis of liver: Secondary | ICD-10-CM

## 2024-06-14 DIAGNOSIS — R0609 Other forms of dyspnea: Secondary | ICD-10-CM

## 2024-06-14 DIAGNOSIS — R188 Other ascites: Secondary | ICD-10-CM | POA: Diagnosis not present

## 2024-06-14 NOTE — Assessment & Plan Note (Signed)
 Likely multifactorial.  EKG with no evidence of ischemia.  Labs and chest xray today.

## 2024-06-14 NOTE — Patient Instructions (Signed)
 Rest.  Labs and chest xray today.  Recommend follow up with Cardiology.

## 2024-06-14 NOTE — Progress Notes (Signed)
 Subjective:  Patient ID: Mitchell Oliver, male    DOB: [birth year]  Age: 78 y.o. MRN: [id number]  CC:   Chief Complaint  Patient presents with   shortness of breath with walking     2 to 3 mins , lack of energy past week or so    HPI:  79 year old male with a complicated past medical history including type 2 diabetes, hypertension, coronary artery disease, paroxysmal A-fib, cirrhosis, COPD presents for evaluation of the above.  Patient reports shortness of breath over the past 2 weeks.  Occurs predominantly with exertion.  He states that he is able to walk a few minutes without getting winded.  No reports of chest pain.  Patient's daughter states that he is having a decreased appetite as well and is sleeping more.  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/06/2020   COPD (chronic obstructive pulmonary disease) (HCC) 07/23/2015   CAD S/P percutaneous coronary angioplasty 07/22/2015   Essential hypertension, benign 06/22/2013   Dyslipidemia 06/22/2013   Type 2 diabetes mellitus with other circulatory complications (HCC) 06/22/2013   Erectile dysfunction 06/22/2013   History of CVA 2012 06/22/2013    Social Hx   Social History   Socioeconomic History   Marital status: Divorced    Spouse name: Not on file   Number of children: 3   Years of education: Not on file   Highest education level: Some college, no degree  Occupational History   Occupation: Airline pilot  Tobacco Use   Smoking status: Former    Current packs/day: 0.00    Average packs/day: 2.0 packs/day for 24.2 years (48.3 ttl pk-yrs)    Types: Cigarettes    Start date: 1963    Quit date: 12/24/1985    Years since quitting: 38.4   Smokeless  tobacco: Never  Vaping Use   Vaping status: Never Used  Substance and Sexual Activity   Alcohol use: Yes    Alcohol/week: 6.0 standard drinks of alcohol    Types: 2 Glasses of wine, 4 Cans of beer per week   Drug use: No   Sexual activity: Yes  Other Topics Concern   Not on file  Social History Narrative   3 daughters, 2 live locally, one lives in [place].   3 grandsons   Active in grandson's travel ball. Works part time for [employer].    Social Drivers of Corporate investment banker Strain: Low Risk  (10/18/2023)   Overall Financial Resource Strain (CARDIA)    Difficulty of Paying Living Expenses: Not very hard  Food Insecurity: No Food Insecurity (01/19/2024)   Hunger Vital Sign    Worried About Running Out of Food in the Last Year: Never true    Ran Out of Food in the Last Year: Never true  Transportation Needs: No Transportation Needs (01/19/2024)   PRAPARE - Administrator, Civil Service (Medical): No    Lack of Transportation (Non-Medical): No  Physical Activity: Unknown (10/18/2023)   Exercise Vital Sign    Days of Exercise per Week: 0 days    Minutes of Exercise per Session: Not on file  Recent Concern: Physical Activity - Inactive (10/18/2023)  Exercise Vital Sign    Days of Exercise per Week: 0 days    Minutes of Exercise per Session: 60 min  Stress: No Stress Concern Present (10/18/2023)   [employer] of Occupational Health - Occupational Stress Questionnaire    Feeling of Stress : Not at all  Social Connections: Moderately Integrated (01/19/2024)   Social Connection and Isolation Panel    Frequency of Communication with Friends and Family: More than three times a week    Frequency of Social Gatherings with Friends and Family: Twice a week    Attends Religious Services: More than 4 times per year    Active Member of [employer] or Organizations: Yes    Attends Engineer, structural: More than 4 times per year    Marital Status:  Divorced    Review of Systems Per HPI  Objective:  BP (!) 142/72   Pulse (!) 47   Temp (!) 97.1 F (36.2 C)   Ht 6' 2 (1.88 m)   Wt 215 lb (97.5 kg)   SpO2 94%   BMI 27.60 kg/m      06/14/2024    1:16 PM 05/25/2024    1:50 PM 03/15/2024   10:37 AM  BP/Weight  Systolic BP 142 121 99  Diastolic BP 72 69 60  Wt. (Lbs) 215 224.6 215.7  BMI 27.6 kg/m2 28.84 kg/m2 27.69 kg/m2    Physical Exam Vitals and nursing note reviewed.  Constitutional:      General: He is not in acute distress.    Appearance: Normal appearance.  HENT:     Head: Normocephalic and atraumatic.     Ears:     Comments: Hard of hearing. Eyes:     General:        Right eye: No discharge.        Left eye: No discharge.     Conjunctiva/sclera: Conjunctivae normal.  Cardiovascular:     Rate and Rhythm: Regular rhythm. Bradycardia present.  Pulmonary:     Effort: Pulmonary effort is normal.     Breath sounds: No wheezing, rhonchi or rales.  Neurological:     Mental Status: He is alert. Mental status is at baseline.     Lab Results  Component Value Date   WBC 8.3 03/10/2024   HGB 14.8 03/10/2024   HCT 43.7 03/10/2024   PLT 162 03/10/2024   GLUCOSE 181 (H) 03/10/2024   CHOL 95 (L) 08/19/2022   TRIG 68 08/19/2022   HDL 40 08/19/2022   LDLCALC 40 08/19/2022   ALT 32 03/10/2024   AST 43 (H) 03/10/2024   NA 135 03/10/2024   K 3.9 03/10/2024   CL 102 03/10/2024   CREATININE 1.31 (H) 03/10/2024   BUN 12 03/10/2024   CO2 19 (L) 03/10/2024   TSH 2.268 01/19/2024   PSA 0.29 03/15/2024   INR 2.9 06/05/2024   HGBA1C 5.5 03/15/2024   EKG: Sinus bradycardia at the rate of 45. Prolonged QT. No evidence of ischemia.  Assessment & Plan:  DOE (dyspnea on exertion) Assessment & Plan: Likely multifactorial.  EKG with no evidence of ischemia.  Labs and chest xray today.  Orders: -     DG Chest 2 View -     Brain natriuretic peptide -     EKG 12-Lead  Iron  deficiency anemia, unspecified iron   deficiency anemia type -     CBC -     Iron , TIBC and Ferritin Panel  Cirrhosis of liver with ascites, unspecified hepatic cirrhosis  type [facility]) -     CMP14+EGFR    Follow-up:  Pending results.  Jacqulyn Ahle DO [facility]

## 2024-06-15 ENCOUNTER — Ambulatory Visit: Payer: Self-pay | Admitting: Family Medicine

## 2024-06-15 LAB — CMP14+EGFR
ALT: 34 IU/L (ref 0–44)
AST: 44 IU/L — ABNORMAL HIGH (ref 0–40)
Albumin: 3.4 g/dL — ABNORMAL LOW (ref 3.8–4.8)
Alkaline Phosphatase: 111 IU/L (ref 44–121)
BUN/Creatinine Ratio: 9 — ABNORMAL LOW (ref 10–24)
BUN: 10 mg/dL (ref 8–27)
Bilirubin Total: 3.1 mg/dL — ABNORMAL HIGH (ref 0.0–1.2)
CO2: 17 mmol/L — ABNORMAL LOW (ref 20–29)
Calcium: 9 mg/dL (ref 8.6–10.2)
Chloride: 103 mmol/L (ref 96–106)
Creatinine, Ser: 1.14 mg/dL (ref 0.76–1.27)
Globulin, Total: 3.1 g/dL (ref 1.5–4.5)
Glucose: 96 mg/dL (ref 70–99)
Potassium: 4.3 mmol/L (ref 3.5–5.2)
Sodium: 135 mmol/L (ref 134–144)
Total Protein: 6.5 g/dL (ref 6.0–8.5)
eGFR: 65 mL/min/1.73

## 2024-06-15 LAB — IRON,TIBC AND FERRITIN PANEL
Ferritin: 290 ng/mL (ref 30–400)
Iron Saturation: 36 % (ref 15–55)
Iron: 83 ug/dL (ref 38–169)
Total Iron Binding Capacity: 228 ug/dL — ABNORMAL LOW (ref 250–450)
UIBC: 145 ug/dL (ref 111–343)

## 2024-06-15 LAB — CBC
Hematocrit: 47.1 % (ref 37.5–51.0)
Hemoglobin: 16.5 g/dL (ref 13.0–17.7)
MCH: 32.2 pg (ref 26.6–33.0)
MCHC: 35 g/dL (ref 31.5–35.7)
MCV: 92 fL (ref 79–97)
Platelets: 149 x10E3/uL — ABNORMAL LOW (ref 150–450)
RBC: 5.12 x10E6/uL (ref 4.14–5.80)
RDW: 13.5 % (ref 11.6–15.4)
WBC: 9.8 x10E3/uL (ref 3.4–10.8)

## 2024-06-15 LAB — BRAIN NATRIURETIC PEPTIDE: BNP: 327.5 pg/mL — ABNORMAL HIGH (ref 0.0–100.0)

## 2024-06-16 ENCOUNTER — Ambulatory Visit (HOSPITAL_COMMUNITY)
Admission: RE | Admit: 2024-06-16 | Discharge: 2024-06-16 | Disposition: A | Discharge status: Home / Self Care | Source: Ambulatory Visit | Attending: Family Medicine | Admitting: Family Medicine

## 2024-06-16 DIAGNOSIS — R0609 Other forms of dyspnea: Secondary | ICD-10-CM | POA: Insufficient documentation

## 2024-06-19 ENCOUNTER — Encounter: Payer: Self-pay | Admitting: Internal Medicine

## 2024-06-19 ENCOUNTER — Other Ambulatory Visit: Payer: Self-pay

## 2024-06-29 ENCOUNTER — Ambulatory Visit: Attending: Internal Medicine | Admitting: Internal Medicine

## 2024-06-29 ENCOUNTER — Encounter: Payer: Self-pay | Admitting: Internal Medicine

## 2024-06-29 ENCOUNTER — Telehealth: Payer: Self-pay | Admitting: Internal Medicine

## 2024-06-29 VITALS — BP 114/62 | HR 47 | Ht 74.0 in | Wt 212.4 lb

## 2024-06-29 DIAGNOSIS — G4733 Obstructive sleep apnea (adult) (pediatric): Secondary | ICD-10-CM

## 2024-06-29 DIAGNOSIS — I5033 Acute on chronic diastolic (congestive) heart failure: Secondary | ICD-10-CM | POA: Diagnosis not present

## 2024-06-29 DIAGNOSIS — R0683 Snoring: Secondary | ICD-10-CM

## 2024-06-29 DIAGNOSIS — R0609 Other forms of dyspnea: Secondary | ICD-10-CM | POA: Diagnosis not present

## 2024-06-29 DIAGNOSIS — Z79899 Other long term (current) drug therapy: Secondary | ICD-10-CM

## 2024-06-29 DIAGNOSIS — I5030 Unspecified diastolic (congestive) heart failure: Secondary | ICD-10-CM | POA: Insufficient documentation

## 2024-06-29 MED ORDER — FUROSEMIDE 40 MG PO TABS: 40.0000 mg | ORAL_TABLET | Freq: Every day | ORAL | 5 refills | Status: DC

## 2024-06-29 NOTE — Telephone Encounter (Signed)
 06/29/2024 lmovm of daughter GLENWOOD Garre.  Needs to be informed of upcoming appointment on 08-01-24 @ 10:30 am in the [facility].

## 2024-06-29 NOTE — Patient Instructions (Addendum)
 Medication Instructions:  Your physician has recommended you make the following change in your medication:  Stop taking Coreg   Increase Lasix  20 mg to 40 mg once daily  Continue taking all other medications as prescribed  Labwork: BMET in one week at [facility] in [place]  BNP in 2 weeks at [facility]   Testing/Procedures: Your physician has recommended that you have a sleep study. This test records several body functions during sleep, including: brain activity, eye movement, oxygen and carbon dioxide blood levels, heart rate and rhythm, breathing rate and rhythm, the flow of air through your mouth and nose, snoring, body muscle movements, and chest and belly movement.   Follow-Up: Your physician recommends that you schedule a follow-up appointment in: 1 month  Any Other Special Instructions Will Be Listed Below (If Applicable). Thank you for choosing [facility]!     If you need a refill on your cardiac medications before your next appointment, please call your pharmacy.

## 2024-06-29 NOTE — Progress Notes (Unsigned)
 Cardiology Office Note  Date: 06/29/2024   ID: [name], [name] 1946/01/02, MRN [id number]  PCP:  Bluford Jacqulyn MATSU, DO  Cardiologist:  Diannah SHAUNNA Maywood, MD Electrophysiologist:  None   History of Present Illness: Mitchell Oliver is a 78 y.o. male  Previous patient of Dr Alveta. Switched care after Dr. Allena retirement.  Patient has a history of CAD s/p PCI in 2016, liver cirrhosis (Child Pugh Class C), TIA in 2012 and PAF. Accompanied by daughter. Collateral history is obtained from the daughter.  PAF was diagnosed during recent March 2025 hospitalization after he was started on Coumadin  due to PAF and Child Pugh class C. DOACs contraindicated.   Patient reported having DOE and fatigue x months. Both worsening. No angina. Frequent napping during the day. No dizziness, syncope, palpitations, leg swelling.  Past Medical History:  Diagnosis Date   Allergy    Ascites    Cirrhosis (HCC)    COPD (chronic obstructive pulmonary disease) (HCC)    CVA (cerebral vascular accident) (HCC) 10/26/2010   denies residual on 07/22/2015   ED (erectile dysfunction)    Family history of adverse reaction to anesthesia    think my mother had real bad headaches after anesthesia   GERD (gastroesophageal reflux disease)    Hypercholesterolemia    Hypertension    IFG (impaired fasting glucose)    NSTEMI (non-ST elevated myocardial infarction) (HCC)    Pneumonia ~ 2005    Past Surgical History:  Procedure Laterality Date   APPENDECTOMY  2009 [facility]   BACK SURGERY     BIOPSY  05/04/2014   Procedure: BIOPSY;  Surgeon: Margo LITTIE Haddock, MD;  [facility];  Service: Endoscopy;;   CARDIAC CATHETERIZATION N/A 07/18/2015   Procedure: Left Heart Cath and Coronary Angiography;  Surgeon: Victory LELON Sharps, MD;  Location: [facility] INVASIVE CV LAB;  Service: Cardiovascular;  Laterality: N/A;   CARDIAC CATHETERIZATION N/A 07/18/2015   Procedure: Coronary Stent Intervention;  Surgeon: Victory LELON Sharps, MD;   Location: [facility] INVASIVE CV LAB;  Service: Cardiovascular;  Laterality: N/A;   CARDIAC CATHETERIZATION N/A 07/22/2015   Procedure: Coronary Stent Intervention;  Surgeon: Victory LELON Sharps, MD;  Location: [facility] INVASIVE CV LAB;  Service: Cardiovascular;  Laterality: N/A;   COLONOSCOPY  2009   COLONOSCOPY N/A 08/04/2019   Procedure: COLONOSCOPY;  Surgeon: Haddock Margo LITTIE, MD;  Location: [facility];  Service: Endoscopy;  Laterality: N/A;  12:30   ESOPHAGOGASTRODUODENOSCOPY N/A 05/04/2014   Procedure: ESOPHAGOGASTRODUODENOSCOPY (EGD);  Surgeon: Margo LITTIE Haddock, MD;  [facility]: [facility];  Service: Endoscopy;  Laterality: N/A;  9:15   ESOPHAGOGASTRODUODENOSCOPY N/A 01/21/2024   Procedure: EGD (ESOPHAGOGASTRODUODENOSCOPY);  Surgeon: Cindie Carlin POUR, DO;  [facility]: [facility];  Service: Endoscopy;  Laterality: N/A;  11:45AM, ASA 3   IR PARACENTESIS  12/02/2023   IR TRANSCATHETER BX  12/02/2023   POLYPECTOMY  08/04/2019   Procedure: POLYPECTOMY;  Surgeon: Haddock Margo LITTIE, MD;  [facility]: [facility];  Service: Endoscopy;;  hepatic flexure, ascending colon, transverse colon, splenic flexure, descending colon    POSTERIOR LAMINECTOMY / DECOMPRESSION LUMBAR SPINE  06/03/2015   SAVORY DILATION N/A 05/04/2014   Procedure: SAVORY DILATION;  Surgeon: Margo LITTIE Haddock, MD;  Location: [facility];  Service: Endoscopy;  Laterality: N/A;    Current Outpatient Medications  Medication Sig Dispense Refill   albuterol  (VENTOLIN  HFA) 108 (90 Base) MCG/ACT inhaler INHALE 2 PUFFS INTO THE LUNGS EVERY 6 HOURS AS NEEDED FOR  WHEEZING OR SHORTNESS OF BREATH 8.5 g 1   benazepril  (LOTENSIN ) 20 MG tablet Take 1 tablet (20 mg total) by mouth daily. 30 tablet 2   carvedilol  (COREG ) 3.125 MG tablet Take 1 tablet (3.125 mg total) by mouth 2 (two) times daily with a meal. 60 tablet 5   dorzolamide -timolol  (COSOPT ) 22.3-6.8 MG/ML ophthalmic solution Place 1 drop into both eyes 2 (two) times daily.     empagliflozin  (JARDIANCE ) 10 MG TABS  tablet Take 1 tablet (10 mg total) by mouth daily. 90 tablet 1   eplerenone (INSPRA) 25 MG tablet Take 50 mg by mouth daily.     furosemide  (LASIX ) 40 MG tablet Take 1 tablet (40 mg total) by mouth daily. (Patient taking differently: Take 20 mg by mouth daily.) 30 tablet 11   ipratropium (ATROVENT ) 0.06 % nasal spray Place 2 sprays into both nostrils 4 (four) times daily. 15 mL 0   Iron , Ferrous Sulfate , 325 (65 Fe) MG TABS Take 325 mg by mouth every Tuesday, Thursday, Saturday, and Sunday. 45 tablet 1   lactulose  (CHRONULAC ) 10 GM/15ML solution Take 30 mLs (20 g total) by mouth 2 (two) times daily. 473 mL 3   latanoprost  (XALATAN ) 0.005 % ophthalmic solution Place 1 drop into both eyes at bedtime.     ondansetron  (ZOFRAN ) 4 MG tablet Take 1 tablet (4 mg total) by mouth every 6 (six) hours as needed for nausea. 20 tablet 0   pantoprazole  (PROTONIX ) 40 MG tablet Take 1 tablet (40 mg total) by mouth daily. 90 tablet 3   warfarin (COUMADIN ) 5 MG tablet TAKE 1 TABLET TO 1 AND 1/2 TABLETS BY MOUTH DAILY AS DIRECTED BY COUMADIN  CLINIC 75 tablet 0   zinc  gluconate 50 MG tablet Take 50 mg by mouth daily.     No current facility-administered medications for this visit.   Allergies:  Bee venom, Shellfish allergy, and Lidocaine    Social History: The patient  reports that he quit smoking about 38 years ago. His smoking use included cigarettes. He started smoking about 62 years ago. He has a 48.3 pack-year smoking history. He has never used smokeless tobacco. He reports current alcohol use of about 6.0 standard drinks of alcohol per week. He reports that he does not use drugs.   Family History: The patient's family history includes Heart attack in his father; Hypertension in his father and mother; Stroke in his father.   ROS:  Please see the history of present illness. Otherwise, complete review of systems is positive for none  All other systems are reviewed and negative.   Physical Exam: VS:  BP 114/62    Pulse (!) 47   Ht 6' 2 (1.88 m)   Wt 212 lb 6.4 oz (96.3 kg)   SpO2 97%   BMI 27.27 kg/m , BMI Body mass index is 27.27 kg/m.  Wt Readings from Last 3 Encounters:  06/29/24 212 lb 6.4 oz (96.3 kg)  06/14/24 215 lb (97.5 kg)  05/25/24 224 lb 9.6 oz (101.9 kg)    General: Patient appears comfortable at rest. HEENT: Conjunctiva and lids normal, oropharynx clear with moist mucosa. Neck: Supple, no elevated JVP or carotid bruits, no thyromegaly. Lungs: Clear to auscultation, nonlabored breathing at rest. Cardiac: Regular rate and rhythm, no S3 or significant systolic murmur, no pericardial rub. Abdomen: Soft, nontender, no hepatomegaly, bowel sounds present, no guarding or rebound. Extremities: No pitting edema, distal pulses 2+. Skin: Warm and dry. Musculoskeletal: No kyphosis. Neuropsychiatric: Alert and oriented x3, affect  grossly appropriate.  Recent Labwork: 01/19/2024: Magnesium  1.6; TSH 2.268 06/14/2024: ALT 34; AST 44; BNP 327.5; BUN 10; Creatinine, Ser 1.14; Hemoglobin 16.5; Platelets 149; Potassium 4.3; Sodium 135     Component Value Date/Time   CHOL 95 (L) 08/19/2022 0941   TRIG 68 08/19/2022 0941   HDL 40 08/19/2022 0941   CHOLHDL 2.4 08/19/2022 0941   CHOLHDL 2.9 07/03/2014 0814   VLDL 21 07/03/2014 0814   LDLCALC 40 08/19/2022 0941    Assessment and Plan:  Acute on chronic diastolic heart failure - DOE x months. BNP elevated, 327. - Currently on p.o lasix  20 mg once daily. - Increase p.o lasix  from 20 mg to 40 mg once daily. - Can take an additional diuretic if DOE not improved. - Obtain BMP in 5 days. - Continue Jardiance  10 mg once daily. - Repeat BNP in 2 weeks. - Obtain split night sleep study for OSA evaluation.  PAF TIA in 2012 - Vitals today remarkable for 47 bpm. Prior EKG from 12/2023 showed HR 70s. - Currently on Coreg  3.125 mg BID. - Discontinue Coreg  due to fatigue and frequent day napping. - Continue Warfarin 5 mg once daily. - Obtain split  night sleep study for OSA evaluation.  CAD s/p PCI in 2016 - No angina/DOE. - Not on aspirin  due to Warfarin use - Not on statin. LDL 40.  HLD, at goal - Not on statin. LDL 40 in 07/2022.  OSA - Snoring, PAF. Obtain split night sleep study for OSA evaluation.  Liver cirrhosis Child Pugh Class C - Follows with hepatologist at [facility].   15 minutes spent in reviewing prior records, more than 3 labs, reports/imaging. 30 minutes spent with the patient discussing ADHF, above problems and documentation.   Medication Adjustments/Labs and Tests Ordered: Current medicines are reviewed at length with the patient today.  Concerns regarding medicines are outlined above.    Disposition:  Follow up 1 month  Signed Astra Gregg Priya Dezmon Conover, MD, 06/29/2024 11:16 AM    [facility] [address] [place], [place], [place] [address]

## 2024-06-30 ENCOUNTER — Telehealth: Payer: Self-pay

## 2024-06-30 DIAGNOSIS — G4733 Obstructive sleep apnea (adult) (pediatric): Secondary | ICD-10-CM | POA: Insufficient documentation

## 2024-06-30 NOTE — Telephone Encounter (Signed)
 Per [facility] provider portal no prior authorization was required for this Split Night Sleep Study.  Prior Authorization/Notification is not required for the requested service(s).  You are not required to submit a notification/prior authorization based on the information provided. If you have general questions about the prior authorization requirements, visit [email or web address] > Clinician Resources > Advance and Admission Notification Requirements. The number above acknowledges your notification. Please write this reference number down for future reference. If you would like to request an organization determination, please call us  at [phone]. Decision ID #: [id number]

## 2024-07-03 ENCOUNTER — Ambulatory Visit: Attending: Internal Medicine | Admitting: *Deleted

## 2024-07-03 DIAGNOSIS — Z5181 Encounter for therapeutic drug level monitoring: Secondary | ICD-10-CM | POA: Diagnosis not present

## 2024-07-03 DIAGNOSIS — Z8673 Personal history of transient ischemic attack (TIA), and cerebral infarction without residual deficits: Secondary | ICD-10-CM

## 2024-07-03 DIAGNOSIS — I4891 Unspecified atrial fibrillation: Secondary | ICD-10-CM | POA: Diagnosis not present

## 2024-07-03 LAB — POCT INR: INR: 4.1 — AB (ref 2.0–3.0)

## 2024-07-03 NOTE — Progress Notes (Signed)
 INR 4.1; Please see anticoagulation encounter

## 2024-07-03 NOTE — Patient Instructions (Signed)
 Hold warfarin tonight then resume 1 tablet daily except 1/2 tablet on Tuesdays and Fridays Recheck in 2 wks

## 2024-07-06 ENCOUNTER — Encounter: Payer: Self-pay | Admitting: Internal Medicine

## 2024-07-06 NOTE — Telephone Encounter (Signed)
 Sent Dr Cindie this message in a secure chat

## 2024-07-07 ENCOUNTER — Ambulatory Visit: Payer: Self-pay | Admitting: Internal Medicine

## 2024-07-07 DIAGNOSIS — I5033 Acute on chronic diastolic (congestive) heart failure: Secondary | ICD-10-CM

## 2024-07-07 DIAGNOSIS — I4891 Unspecified atrial fibrillation: Secondary | ICD-10-CM

## 2024-07-07 DIAGNOSIS — Z79899 Other long term (current) drug therapy: Secondary | ICD-10-CM

## 2024-07-07 LAB — BASIC METABOLIC PANEL WITH GFR
BUN/Creatinine Ratio: 10 (ref 10–24)
BUN: 13 mg/dL (ref 8–27)
CO2: 19 mmol/L — ABNORMAL LOW (ref 20–29)
Calcium: 8.9 mg/dL (ref 8.6–10.2)
Chloride: 101 mmol/L (ref 96–106)
Creatinine, Ser: 1.29 mg/dL — ABNORMAL HIGH (ref 0.76–1.27)
Glucose: 114 mg/dL — ABNORMAL HIGH (ref 70–99)
Potassium: 4.6 mmol/L (ref 3.5–5.2)
Sodium: 135 mmol/L (ref 134–144)
eGFR: 56 mL/min/1.73 — ABNORMAL LOW (ref >59)

## 2024-07-10 NOTE — Telephone Encounter (Signed)
-----   Message from Vishnu P Mallipeddi sent at 07/07/2024 12:04 PM EDT ----- GFR is stable, continue current meds. Repeat BMP in one week. ----- Message ----- From: Interface, [facility] Lab Results In Sent: 07/07/2024   5:38 AM EDT To: Vishnu P Mallipeddi, MD

## 2024-07-10 NOTE — Telephone Encounter (Signed)
 Left detailed message and sent MyChart message. PCP copied. Lab ordered mailed

## 2024-07-14 ENCOUNTER — Other Ambulatory Visit: Payer: Self-pay | Admitting: Family Medicine

## 2024-07-14 LAB — BASIC METABOLIC PANEL WITH GFR
BUN/Creatinine Ratio: 10 (ref 10–24)
BUN: 13 mg/dL (ref 8–27)
CO2: 20 mmol/L (ref 20–29)
Calcium: 9.2 mg/dL (ref 8.6–10.2)
Chloride: 106 mmol/L (ref 96–106)
Creatinine, Ser: 1.3 mg/dL — ABNORMAL HIGH (ref 0.76–1.27)
Glucose: 163 mg/dL — ABNORMAL HIGH (ref 70–99)
Potassium: 4.8 mmol/L (ref 3.5–5.2)
Sodium: 137 mmol/L (ref 134–144)
eGFR: 56 mL/min/1.73 — ABNORMAL LOW (ref >59)

## 2024-07-15 LAB — BRAIN NATRIURETIC PEPTIDE: BNP: 261.9 pg/mL — ABNORMAL HIGH (ref 0.0–100.0)

## 2024-07-17 ENCOUNTER — Ambulatory Visit: Attending: Internal Medicine | Admitting: *Deleted

## 2024-07-17 DIAGNOSIS — Z8673 Personal history of transient ischemic attack (TIA), and cerebral infarction without residual deficits: Secondary | ICD-10-CM

## 2024-07-17 DIAGNOSIS — I4891 Unspecified atrial fibrillation: Secondary | ICD-10-CM

## 2024-07-17 DIAGNOSIS — Z5181 Encounter for therapeutic drug level monitoring: Secondary | ICD-10-CM | POA: Diagnosis not present

## 2024-07-17 LAB — POCT INR: INR: 2.6 (ref 2.0–3.0)

## 2024-07-17 NOTE — Progress Notes (Signed)
 INR 2.6; Please see anticoagulation encounter

## 2024-07-17 NOTE — Patient Instructions (Signed)
Continue warfarin 1 tablet daily except 1/2 tablet on Tuesdays and Fridays Recheck in 4 wks 

## 2024-07-18 ENCOUNTER — Ambulatory Visit (INDEPENDENT_AMBULATORY_CARE_PROVIDER_SITE_OTHER): Admitting: *Deleted

## 2024-07-18 ENCOUNTER — Ambulatory Visit

## 2024-07-18 DIAGNOSIS — Z23 Encounter for immunization: Secondary | ICD-10-CM | POA: Diagnosis not present

## 2024-08-01 ENCOUNTER — Ambulatory Visit: Admitting: Internal Medicine

## 2024-08-11 ENCOUNTER — Ambulatory Visit

## 2024-08-11 ENCOUNTER — Telehealth: Payer: Self-pay | Admitting: Internal Medicine

## 2024-08-11 VITALS — Ht 74.0 in | Wt 212.0 lb

## 2024-08-11 DIAGNOSIS — Z0000 Encounter for general adult medical examination without abnormal findings: Secondary | ICD-10-CM

## 2024-08-11 NOTE — Telephone Encounter (Signed)
 Mitchell Oliver Pt wants to move his COUMADIN  appt for 08/14/24 to 08/21/24. I told him I would have to send a message since it will be a week out. Please advise.

## 2024-08-11 NOTE — Patient Instructions (Signed)
 Mr. Villena,  Thank you for taking the time for your Medicare Wellness Visit. I appreciate your continued commitment to your health goals. Please review the care plan we discussed, and feel free to reach out if I can assist you further.  Medicare recommends these wellness visits once per year to help you and your care team stay ahead of potential health issues. These visits are designed to focus on prevention, allowing your provider to concentrate on managing your acute and chronic conditions during your regular appointments.  Please note that Annual Wellness Visits do not include a physical exam. Some assessments may be limited, especially if the visit was conducted virtually. If needed, we may recommend a separate in-person follow-up with your provider.  Ongoing Care Seeing your primary care provider every 3 to 6 months helps us  monitor your health and provide consistent, personalized care.   Referrals If a referral was made during today's visit and you haven't received any updates within two weeks, please contact the referred provider directly to check on the status.  Recommended Screenings:  Health Maintenance  Topic Date Due   Complete foot exam   08/20/2023   Yearly kidney health urinalysis for diabetes  07/29/2024   DTaP/Tdap/Td vaccine (1 - Tdap) 01/03/2025*   Hemoglobin A1C  09/15/2024   Eye exam for diabetics  03/28/2025   Yearly kidney function blood test for diabetes  07/13/2025   Medicare Annual Wellness Visit  08/11/2025   Colon Cancer Screening  08/03/2029   Pneumococcal Vaccine for age over 63  Completed   Flu Shot  Completed   Zoster (Shingles) Vaccine  Completed   Meningitis B Vaccine  Aged Out   COVID-19 Vaccine  Discontinued   Hepatitis C Screening  Discontinued  *Topic was postponed. The date shown is not the original due date.       08/11/2024    9:26 AM  Advanced Directives  Does Patient Have a Medical Advance Directive? No  Would patient like  information on creating a medical advance directive? Yes (MAU/Ambulatory/Procedural Areas - Information given)   Advance Care Planning is important because it: Ensures you receive medical care that aligns with your values, goals, and preferences. Provides guidance to your family and loved ones, reducing the emotional burden of decision-making during critical moments.  Information on Advanced Care Planning can be found at [place]  Secretary of [facility] Advance Health Care Directives Advance Health Care Directives ([email or web address]   Vision: Annual vision screenings are recommended for early detection of glaucoma, cataracts, and diabetic retinopathy. These exams can also reveal signs of chronic conditions such as diabetes and high blood pressure.  Dental: Annual dental screenings help detect early signs of oral cancer, gum disease, and other conditions linked to overall health, including heart disease and diabetes.  Please see the attached documents for additional preventive care recommendations.

## 2024-08-11 NOTE — Progress Notes (Signed)
 Subjective:   Mitchell Oliver is a 78 y.o. who presents for a Medicare Wellness preventive visit.  As a reminder, Annual Wellness Visits don't include a physical exam, and some assessments may be limited, especially if this visit is performed virtually. We may recommend an in-person follow-up visit with your provider if needed.  Visit Complete: Virtual I connected with  Mitchell Oliver on 08/11/24 by a audio enabled telemedicine application and verified that I am speaking with the correct person using two identifiers.  Patient Location: Home  Provider Location: Home Office  I discussed the limitations of evaluation and management by telemedicine. The patient expressed understanding and agreed to proceed.  Vital Signs: Because this visit was a virtual/telehealth visit, some criteria may be missing or patient reported. Any vitals not documented were not able to be obtained and vitals that have been documented are patient reported.  VideoError- Librarian, academic were attempted between this provider and patient, however failed, due to patient having technical difficulties OR patient did not have access to video capability.  We continued and completed visit with audio only.   Persons Participating in Visit: Patient.  AWV Questionnaire: No: Patient Medicare AWV questionnaire was not completed prior to this visit.  Cardiac Risk Factors include: advanced age (>40men, >55 women);diabetes mellitus;dyslipidemia;hypertension;male gender     Objective:    Today's Vitals   08/11/24 0916  Weight: 212 lb (96.2 kg)  Height: 6' 2 (1.88 m)   Body mass index is 27.22 kg/m.     08/11/2024    9:26 AM 01/21/2024    9:25 AM 01/18/2024    4:52 PM 12/13/2023    2:49 PM 12/02/2023   11:30 AM 10/04/2023    4:21 PM 10/04/2023   11:21 AM  Advanced Directives  Does Patient Have a Medical Advance Directive? No No No No No No No  Would patient like information on creating a  medical advance directive? Yes (MAU/Ambulatory/Procedural Areas - Information given) No - Patient declined No - Patient declined No - Patient declined No - Patient declined No - Patient declined No - Patient declined    Current Medications (verified) Outpatient Encounter Medications as of 08/11/2024  Medication Sig   albuterol  (VENTOLIN  HFA) 108 (90 Base) MCG/ACT inhaler INHALE 2 PUFFS INTO THE LUNGS EVERY 6 HOURS AS NEEDED FOR WHEEZING OR SHORTNESS OF BREATH   dorzolamide -timolol  (COSOPT ) 22.3-6.8 MG/ML ophthalmic solution Place 1 drop into both eyes 2 (two) times daily.   empagliflozin  (JARDIANCE ) 10 MG TABS tablet Take 1 tablet (10 mg total) by mouth daily.   eplerenone (INSPRA) 25 MG tablet Take 50 mg by mouth daily.   furosemide  (LASIX ) 40 MG tablet Take 1 tablet (40 mg total) by mouth daily.   ipratropium (ATROVENT ) 0.06 % nasal spray Place 2 sprays into both nostrils 4 (four) times daily.   Iron , Ferrous Sulfate , 325 (65 Fe) MG TABS Take 325 mg by mouth every Tuesday, Thursday, Saturday, and Sunday.   lactulose  (CHRONULAC ) 10 GM/15ML solution Take 30 mLs (20 g total) by mouth 2 (two) times daily.   latanoprost  (XALATAN ) 0.005 % ophthalmic solution Place 1 drop into both eyes at bedtime.   ondansetron  (ZOFRAN ) 4 MG tablet Take 1 tablet (4 mg total) by mouth every 6 (six) hours as needed for nausea.   pantoprazole  (PROTONIX ) 40 MG tablet Take 1 tablet (40 mg total) by mouth daily.   warfarin (COUMADIN ) 5 MG tablet TAKE 1 TABLET TO 1 AND 1/2 TABLETS BY  MOUTH DAILY AS DIRECTED BY COUMADIN  CLINIC   zinc  gluconate 50 MG tablet Take 50 mg by mouth daily.   benazepril  (LOTENSIN ) 20 MG tablet TAKE 1 TABLET(20 MG) BY MOUTH DAILY   No facility-administered encounter medications on file as of 08/11/2024.    Allergies (verified) Bee venom, Shellfish allergy, and Lidocaine    History: Past Medical History:  Diagnosis Date   Allergy    Ascites    Cirrhosis (HCC)    COPD (chronic obstructive  pulmonary disease) (HCC)    CVA (cerebral vascular accident) (HCC) 10/26/2010   denies residual on 07/22/2015   ED (erectile dysfunction)    Family history of adverse reaction to anesthesia    think my mother had real bad headaches after anesthesia   GERD (gastroesophageal reflux disease)    Hypercholesterolemia    Hypertension    IFG (impaired fasting glucose)    NSTEMI (non-ST elevated myocardial infarction) (HCC)    Pneumonia ~ 2005   Past Surgical History:  Procedure Laterality Date   APPENDECTOMY  2009 [facility]   BACK SURGERY     BIOPSY  05/04/2014   Procedure: BIOPSY;  Surgeon: Margo LITTIE Haddock, MD;  [facility];  Service: Endoscopy;;   CARDIAC CATHETERIZATION N/A 07/18/2015   Procedure: Left Heart Cath and Coronary Angiography;  Surgeon: Victory LELON Sharps, MD;  Location: [facility] INVASIVE CV LAB;  Service: Cardiovascular;  Laterality: N/A;   CARDIAC CATHETERIZATION N/A 07/18/2015   Procedure: Coronary Stent Intervention;  Surgeon: Victory LELON Sharps, MD;  Location: [facility] INVASIVE CV LAB;  Service: Cardiovascular;  Laterality: N/A;   CARDIAC CATHETERIZATION N/A 07/22/2015   Procedure: Coronary Stent Intervention;  Surgeon: Victory LELON Sharps, MD;  Location: [facility] INVASIVE CV LAB;  Service: Cardiovascular;  Laterality: N/A;   COLONOSCOPY  2009   COLONOSCOPY N/A 08/04/2019   Procedure: COLONOSCOPY;  Surgeon: Haddock Margo LITTIE, MD;  [facility]: [facility];  Service: Endoscopy;  Laterality: N/A;  12:30   ESOPHAGOGASTRODUODENOSCOPY N/A 05/04/2014   Procedure: ESOPHAGOGASTRODUODENOSCOPY (EGD);  Surgeon: Margo LITTIE Haddock, MD;  [facility];  Service: Endoscopy;  Laterality: N/A;  9:15   ESOPHAGOGASTRODUODENOSCOPY N/A 01/21/2024   Procedure: EGD (ESOPHAGOGASTRODUODENOSCOPY);  Surgeon: Cindie Carlin POUR, DO;  Location: [facility];  Service: Endoscopy;  Laterality: N/A;  11:45AM, ASA 3   IR PARACENTESIS  12/02/2023   IR TRANSCATHETER BX  12/02/2023   POLYPECTOMY  08/04/2019   Procedure: POLYPECTOMY;   Surgeon: Haddock Margo LITTIE, MD;  [facility]: [facility];  Service: Endoscopy;;  hepatic flexure, ascending colon, transverse colon, splenic flexure, descending colon    POSTERIOR LAMINECTOMY / DECOMPRESSION LUMBAR SPINE  06/03/2015   SAVORY DILATION N/A 05/04/2014   Procedure: SAVORY DILATION;  Surgeon: Margo LITTIE Haddock, MD;  [facility]: [facility];  Service: Endoscopy;  Laterality: N/A;   Family History  Problem Relation Age of Onset   Hypertension Mother    Hypertension Father    Heart attack Father    Stroke Father    Colon cancer Neg Hx    Colon polyps Neg Hx    Social History   Socioeconomic History   Marital status: Divorced    Spouse name: Not on file   Number of children: 3   Years of education: Not on file   Highest education level: Some college, no degree  Occupational History   Occupation: Airline pilot  Tobacco Use   Smoking status: Former    Current packs/day: 0.00    Average packs/day: 2.0 packs/day for  24.2 years (48.3 ttl pk-yrs)    Types: Cigarettes    Start date: 53    Quit date: 12/24/1985    Years since quitting: 38.6   Smokeless tobacco: Never  Vaping Use   Vaping status: Never Used  Substance and Sexual Activity   Alcohol use: Yes    Alcohol/week: 6.0 standard drinks of alcohol    Types: 2 Glasses of wine, 4 Cans of beer per week   Drug use: No   Sexual activity: Yes  Other Topics Concern   Not on file  Social History Narrative   3 daughters, 2 live locally, one lives in [place].   3 grandsons   Active in grandson's travel ball. Works part time for [employer].    Social Drivers of Corporate investment banker Strain: Low Risk  (08/11/2024)   Overall Financial Resource Strain (CARDIA)    Difficulty of Paying Living Expenses: Not hard at all  Food Insecurity: No Food Insecurity (08/11/2024)   Hunger Vital Sign    Worried About Running Out of Food in the Last Year: Never true    Ran Out of Food in the Last Year: Never true  Transportation  Needs: No Transportation Needs (08/11/2024)   PRAPARE - Administrator, Civil Service (Medical): No    Lack of Transportation (Non-Medical): No  Physical Activity: Inactive (08/11/2024)   Exercise Vital Sign    Days of Exercise per Week: 0 days    Minutes of Exercise per Session: 0 min  Stress: No Stress Concern Present (08/11/2024)   [employer] of Occupational Health - Occupational Stress Questionnaire    Feeling of Stress: Not at all  Social Connections: Moderately Integrated (08/11/2024)   Social Connection and Isolation Panel    Frequency of Communication with Friends and Family: More than three times a week    Frequency of Social Gatherings with Friends and Family: Twice a week    Attends Religious Services: More than 4 times per year    Active Member of [employer] or Organizations: Yes    Attends Engineer, structural: More than 4 times per year    Marital Status: Divorced    Tobacco Counseling Counseling given: Not Answered    Clinical Intake:  Pre-visit preparation completed: Yes  Pain : No/denies pain  Diabetes: Yes CBG done?: No Did pt. bring in CBG monitor from home?: No  Lab Results  Component Value Date   HGBA1C 5.5 03/15/2024   HGBA1C 5.8 (H) 07/30/2023   HGBA1C 6.2 (H) 08/19/2022     How often do you need to have someone help you when you read instructions, pamphlets, or other written materials from your doctor or pharmacy?: 1 - Never  Interpreter Needed?: No  Information entered by :: Charmaine Bloodgood LPN   Activities of Daily Living     08/11/2024    9:15 AM 01/19/2024    1:48 AM  In your present state of health, do you have any difficulty performing the following activities:  Hearing? 0 0  Vision? 0 0  Difficulty concentrating or making decisions? 0 0  Walking or climbing stairs? 0   Dressing or bathing? 0   Doing errands, shopping? 0   Preparing Food and eating ? N   Using the Toilet? N   In the past six months, have  you accidently leaked urine? N   Do you have problems with loss of bowel control? N   Managing your Medications? N  Managing your Finances? N   Housekeeping or managing your Housekeeping? N     Patient Care Team: Cook, Jayce G, DO as PCP - General (Family Medicine) Stacia Diannah SQUIBB, MD as PCP - Cardiology (Cardiology) Glendia Simmonds, OD as Referring Physician (Optometry) Cindie Carlin POUR, DO as Consulting Physician (Gastroenterology) Hays Morrison, CRNP as Nurse Practitioner  I have updated your Care Teams any recent Medical Services you may have received from other providers in the past year.     Assessment:   This is a routine wellness examination for Mitchell Oliver.  Hearing/Vision screen Hearing Screening - Comments:: Patient is able to hear conversational tones without difficulty. No issues reported.   Vision Screening - Comments:: Wears rx glasses - up to date with routine eye exams with Dr. Simmonds Glendia     Goals Addressed             This Visit's Progress    Remain active and independent   On track      Depression Screen     08/11/2024    9:25 AM 06/14/2024    1:26 PM 01/04/2024    3:45 PM 08/12/2023    3:07 PM 11/13/2022   12:03 PM 08/19/2022    9:07 AM 08/07/2021    9:04 AM  PHQ 2/9 Scores  PHQ - 2 Score 2 2 0 0 0 0 0  PHQ- 9 Score 6 7 1 2        Fall Risk     08/11/2024    9:26 AM 06/14/2024    1:27 PM 01/04/2024    3:45 PM 11/13/2022   11:37 AM 08/19/2022    9:07 AM  Fall Risk   Falls in the past year? 0 0 0 0 0  Number falls in past yr: 0 0  0 0  Injury with Fall? 0 0  0 0  Risk for fall due to : No Fall Risks No Fall Risks   No Fall Risks  Follow up Falls prevention discussed;Education provided;Falls evaluation completed Falls evaluation completed  Falls prevention discussed;Education provided;Falls evaluation completed  Falls evaluation completed      Data saved with a previous flowsheet row definition    MEDICARE RISK AT HOME:  Medicare Risk at  Home Any stairs in or around the home?: No If so, are there any without handrails?: No Home free of loose throw rugs in walkways, pet beds, electrical cords, etc?: Yes Adequate lighting in your home to reduce risk of falls?: Yes Life alert?: No Use of a cane, walker or w/c?: No Grab bars in the bathroom?: Yes Shower chair or bench in shower?: No Elevated toilet seat or a handicapped toilet?: Yes  TIMED UP AND GO:  Was the test performed?  No  Cognitive Function: 6CIT completed        08/11/2024    9:37 AM 11/13/2022   12:08 PM 07/22/2021    8:55 AM  6CIT Screen  What Year? 0 points 0 points 0 points  What month? 0 points 0 points 0 points  What time? 0 points 0 points 0 points  Count back from 20 0 points 0 points 0 points  Months in reverse 0 points 0 points 0 points  Repeat phrase 0 points 0 points 0 points  Total Score 0 points 0 points 0 points    Immunizations Immunization History  Administered Date(s) Administered   Fluad Quad(high Dose 65+) 07/09/2020, 08/19/2022   INFLUENZA, HIGH DOSE SEASONAL PF 08/08/2014, 07/25/2019, 07/18/2024  Influenza,inj,Quad PF,6+ Mos 07/03/2015, 07/06/2016, 07/15/2017, 07/19/2018   Influenza-Unspecified 08/23/2013, 07/22/2021   PNEUMOCOCCAL CONJUGATE-20 08/19/2022   Pneumococcal Conjugate-13 08/28/2015   Pneumococcal Polysaccharide-23 11/26/2012   Zoster Recombinant(Shingrix) 06/26/2017, 08/26/2017, 09/17/2017   Zoster, Live 12/14/2012    Screening Tests Health Maintenance  Topic Date Due   FOOT EXAM  08/20/2023   Diabetic kidney evaluation - Urine ACR  07/29/2024   DTaP/Tdap/Td (1 - Tdap) 01/03/2025 (Originally 07/05/1965)   HEMOGLOBIN A1C  09/15/2024   OPHTHALMOLOGY EXAM  03/28/2025   Diabetic kidney evaluation - eGFR measurement  07/13/2025   Medicare Annual Wellness (AWV)  08/11/2025   Colonoscopy  08/03/2029   Pneumococcal Vaccine: 50+ Years  Completed   Influenza Vaccine  Completed   Zoster Vaccines- Shingrix  Completed    Meningococcal B Vaccine  Aged Out   COVID-19 Vaccine  Discontinued   Hepatitis C Screening  Discontinued    Health Maintenance Items Addressed: Patient is up to date   Additional Screening:  Vision Screening: Recommended annual ophthalmology exams for early detection of glaucoma and other disorders of the eye. Is the patient up to date with their annual eye exam?  Yes  Who is the provider or what is the name of the office in which the patient attends annual eye exams? Mitchell Oliver   Dental Screening: Recommended annual dental exams for proper oral hygiene  Community Resource Referral / Chronic Care Management: CRR required this visit?  No   CCM required this visit?  No   Plan:    I have personally reviewed and noted the following in the patient's chart:   Medical and social history Use of alcohol, tobacco or illicit drugs  Current medications and supplements including opioid prescriptions. Patient is not currently taking opioid prescriptions. Functional ability and status Nutritional status Physical activity Advanced directives List of other physicians Hospitalizations, surgeries, and ER visits in previous 12 months Vitals Screenings to include cognitive, depression, and falls Referrals and appointments  In addition, I have reviewed and discussed with patient certain preventive protocols, quality metrics, and best practice recommendations. A written personalized care plan for preventive services as well as general preventive health recommendations were provided to patient.   Mitchell Oliver, Mitchell Oliver   [id number]   After Visit Summary: (MyChart) Due to this being a telephonic visit, the after visit summary with patients personalized plan was offered to patient via MyChart   Notes: Nothing significant to report at this time.

## 2024-08-14 ENCOUNTER — Encounter

## 2024-08-21 ENCOUNTER — Telehealth: Payer: Self-pay | Admitting: Internal Medicine

## 2024-08-21 ENCOUNTER — Ambulatory Visit: Attending: Internal Medicine | Admitting: *Deleted

## 2024-08-21 DIAGNOSIS — I4891 Unspecified atrial fibrillation: Secondary | ICD-10-CM

## 2024-08-21 DIAGNOSIS — Z8673 Personal history of transient ischemic attack (TIA), and cerebral infarction without residual deficits: Secondary | ICD-10-CM

## 2024-08-21 DIAGNOSIS — Z5181 Encounter for therapeutic drug level monitoring: Secondary | ICD-10-CM

## 2024-08-21 LAB — POCT INR: INR: 2.7 (ref 2.0–3.0)

## 2024-08-21 NOTE — Patient Instructions (Signed)
Continue warfarin 1 tablet daily except 1/2 tablet on Tuesdays and Fridays Recheck in 4 wks 

## 2024-08-21 NOTE — Telephone Encounter (Signed)
 Pt would like to switch from Dr Mallipeddi to Dr Jeffrie please advise.

## 2024-08-21 NOTE — Progress Notes (Signed)
 INR 2.7; Please see anticoagulation encounter

## 2024-08-22 ENCOUNTER — Emergency Department (HOSPITAL_COMMUNITY)

## 2024-08-22 ENCOUNTER — Other Ambulatory Visit: Payer: Self-pay

## 2024-08-22 ENCOUNTER — Emergency Department (HOSPITAL_COMMUNITY)
Admission: EM | Admit: 2024-08-22 | Discharge: 2024-08-22 | Disposition: A | Discharge status: Home / Self Care | Attending: Emergency Medicine | Admitting: Emergency Medicine

## 2024-08-22 DIAGNOSIS — R002 Palpitations: Secondary | ICD-10-CM | POA: Diagnosis present

## 2024-08-22 DIAGNOSIS — R0789 Other chest pain: Secondary | ICD-10-CM | POA: Insufficient documentation

## 2024-08-22 DIAGNOSIS — Z7901 Long term (current) use of anticoagulants: Secondary | ICD-10-CM | POA: Diagnosis not present

## 2024-08-22 DIAGNOSIS — R7989 Other specified abnormal findings of blood chemistry: Secondary | ICD-10-CM | POA: Diagnosis not present

## 2024-08-22 DIAGNOSIS — K769 Liver disease, unspecified: Secondary | ICD-10-CM

## 2024-08-22 DIAGNOSIS — R0609 Other forms of dyspnea: Secondary | ICD-10-CM | POA: Diagnosis not present

## 2024-08-22 DIAGNOSIS — I509 Heart failure, unspecified: Secondary | ICD-10-CM | POA: Insufficient documentation

## 2024-08-22 DIAGNOSIS — R079 Chest pain, unspecified: Secondary | ICD-10-CM | POA: Diagnosis not present

## 2024-08-22 DIAGNOSIS — I482 Chronic atrial fibrillation, unspecified: Secondary | ICD-10-CM | POA: Insufficient documentation

## 2024-08-22 DIAGNOSIS — E876 Hypokalemia: Secondary | ICD-10-CM | POA: Diagnosis not present

## 2024-08-22 DIAGNOSIS — I491 Atrial premature depolarization: Secondary | ICD-10-CM | POA: Diagnosis not present

## 2024-08-22 DIAGNOSIS — Z8679 Personal history of other diseases of the circulatory system: Secondary | ICD-10-CM

## 2024-08-22 DIAGNOSIS — I2510 Atherosclerotic heart disease of native coronary artery without angina pectoris: Secondary | ICD-10-CM

## 2024-08-22 LAB — BASIC METABOLIC PANEL WITH GFR
Anion gap: 10 (ref 5–15)
BUN: 10 mg/dL (ref 8–23)
CO2: 19 mmol/L — ABNORMAL LOW (ref 22–32)
Calcium: 8.3 mg/dL — ABNORMAL LOW (ref 8.9–10.3)
Chloride: 105 mmol/L (ref 98–111)
Creatinine, Ser: 1.32 mg/dL — ABNORMAL HIGH (ref 0.61–1.24)
GFR, Estimated: 55 mL/min — ABNORMAL LOW (ref >60)
Glucose, Bld: 164 mg/dL — ABNORMAL HIGH (ref 70–99)
Potassium: 3.3 mmol/L — ABNORMAL LOW (ref 3.5–5.1)
Sodium: 134 mmol/L — ABNORMAL LOW (ref 135–145)

## 2024-08-22 LAB — CBC
HCT: 44.7 % (ref 39.0–52.0)
Hemoglobin: 15.5 g/dL (ref 13.0–17.0)
MCH: 32.6 pg (ref 26.0–34.0)
MCHC: 34.7 g/dL (ref 30.0–36.0)
MCV: 93.9 fL (ref 80.0–100.0)
Platelets: 161 K/uL (ref 150–400)
RBC: 4.76 MIL/uL (ref 4.22–5.81)
RDW: 16.1 % — ABNORMAL HIGH (ref 11.5–15.5)
WBC: 9 K/uL (ref 4.0–10.5)
nRBC: 0 % (ref 0.0–0.2)

## 2024-08-22 LAB — BRAIN NATRIURETIC PEPTIDE: B Natriuretic Peptide: 546.5 pg/mL — ABNORMAL HIGH (ref 0.0–100.0)

## 2024-08-22 LAB — PROTIME-INR
INR: 2.9 — ABNORMAL HIGH (ref 0.8–1.2)
Prothrombin Time: 31.6 s — ABNORMAL HIGH (ref 11.4–15.2)

## 2024-08-22 LAB — TROPONIN I (HIGH SENSITIVITY)
Troponin I (High Sensitivity): 15 ng/L (ref <18)
Troponin I (High Sensitivity): 13 ng/L (ref <18)

## 2024-08-22 MED ORDER — POTASSIUM CHLORIDE 20 MEQ PO PACK
40.0000 meq | PACK | Freq: Once | ORAL | Status: AC
  Administered 2024-08-22: 40 meq via ORAL
  Filled 2024-08-22: qty 2

## 2024-08-22 NOTE — ED Notes (Signed)
 Paper work reviewed with pt and daugher. Pt is in no new onset distress. Pt is being taken to lobby.

## 2024-08-22 NOTE — Discharge Instructions (Addendum)
 It was our pleasure to provide your ER care today - we hope that you feel better.  From today's labs, your potassium level is mildly low - eat plenty of fruits and vegetables and follow up with your primary doctor in 1-2 weeks and cardiologist as scheduled for next Monday  For recent palpitations, afib, chest symptoms, follow up closely with your cardiologist as discussed with them.  Return to ER right away if worse, new symptoms, fevers, chest pain, increased, trouble breathing, or other concern.

## 2024-08-22 NOTE — ED Provider Notes (Signed)
  Physical Exam  BP (!) 141/80 (BP Location: Right Arm)   Pulse (!) 48   Temp 98 F (36.7 C) (Oral)   Resp 16   Ht 6' 2 (1.88 m)   Wt 96 kg   SpO2 90%   BMI 27.17 kg/m   Physical Exam Vitals and nursing note reviewed.  HENT:     Head: Normocephalic and atraumatic.  Eyes:     Pupils: Pupils are equal, round, and reactive to light.  Cardiovascular:     Rate and Rhythm: Normal rate and regular rhythm.  Pulmonary:     Effort: Pulmonary effort is normal.     Breath sounds: Normal breath sounds.  Abdominal:     Palpations: Abdomen is soft.     Tenderness: There is no abdominal tenderness.  Skin:    General: Skin is warm and dry.  Neurological:     Mental Status: He is alert.  Psychiatric:        Mood and Affect: Mood normal.     Procedures  Procedures  ED Course / MDM   Clinical Course as of 08/22/24 1732  Tue Aug 22, 2024  1730 Cardiology has evaluated patient in the ED.  They recommend outpatient follow-up which the patient has scheduled early next week and 6 days.  Return precautions were discussed in detail.  He remained stable at this time and knows to come back to the ED for.  Appropriate for discharge [MP]    Clinical Course User Index [MP] Pamella Ozell LABOR, DO   Medical Decision Making I, Ozell Pamella DO, have assumed care of this patient from the previous provider pending cardiology evaluation, cardiology recommendations and disposition  Amount and/or Complexity of Data Reviewed Labs: ordered. Radiology: ordered.  Risk Prescription drug management.          Pamella Ozell LABOR, DO 08/22/24 1732

## 2024-08-22 NOTE — Consult Note (Addendum)
 Cardiology Consultation   Patient ID: Mitchell Oliver MRN: [id number]; DOB: Dec 09, 1945  Admit date: 08/22/2024 Date of Consult: 08/22/2024  PCP:  Cook, Jayce G, DO   [facility] Providers Cardiologist:  Mitchell P Mallipeddi, MD      Patient Profile: Mitchell Oliver is a 78 y.o. male with a hx of CAD s/p NSTEMI in 2016 with DES to PDA and staged PCI to Diagonal, chronic diastolic heart failure, hypertension, hyperlipidemia, GERD, cirrhosis, and emphysema who is being seen 08/22/2024 for the evaluation of chest pain and shortness of breath on exertion at the request of Mitchell Gaul MD.  History of Present Illness: Mr. Mitchell Oliver previously followed with Dr. Claudene, Dr. Calhoun, and Dr. Mallipeddi. He has a history of coronary artery disease s/p staged PCI to the proximal PDA and diagonal in 2016.  Of note the procedure was complicated and prolonged due to vessel tortuosity and poor guide support from the radial approach.  He was noted to have diffuse but noncritical disease to the circumflex and RCA. Widely patent LAD.  Patient was admitted 12/2023 for palpitations, he was found to be in A-fib with RVR via EMS and route.  Arrival to the ED patient was back in normal sinus rhythm.  It appears during this hospitalization his telemetry was showing normal sinus rhythm with frequent PACs, there was one episode of AF ~ 5 minutes on telemetry. During this admission echocardiogram showed LVEF 65 to 70% with no RWMA.  Normal RV systolic function.  Moderate calcification of the AV valve without stenosis or AI. Patient was discharged on Coumadin  [DOAC contraindicated with patient's degree of cirrhosis] and he was to wear heart monitor.   Heart monitor showed 600 runs of nonsustained supraventricular tachycardia, no evidence of A-fib/a flutter.  PAC burden was 14.9%.  He was last seen by Dr. Mallipeddi, 06/2024 and at that time patient was endorsing DOE for several months.  His outpatient diuresis was  increased.  He also was reporting fatigue and his Coreg  was discontinued.  He was advised to obtain a sleep study for OSA evaluation.  Presented to the ED on 10/28 for DOE and chest pain with radiation to the left arm. BP 130/71      HR 50 ECG sinus rhythm with PAC VR 57 CXR without evidence of volume overload  Pertinent lab work K3.3  [repleted]   Cr 1.32 BNP 546   negative troponins x 2 INR 2.9  On interview, did not noticed improvement in his symptoms with lasix  increase, did not change UOP. Did report significant improvement with stopping coreg .  Patient shared he has been having DOE for awhile, has worsened recently. Will get out of breath after about 8 stairs. Sits down to do dishes.  Does notice palpitations, randomly and after exertion. Has occasional lightheadedness. No syncope. Per daughter has noticed his HR getting up to 160 before.  Denied chest pain. He did have one episode of bilateral shoulder pain-> patient had moved his bed the day of, and woke up to his arm being numb from sleeping on it wrong, then noticed pain to his bilateral shoulders. Does have known arthritis. [ Patient had back pain with previous NSTEMI, patient shared he has chronic back pain- no change] Has not noticed peripheral edema.    Past Medical History:  Diagnosis Date   Allergy    Ascites    Cirrhosis (HCC)    COPD (chronic obstructive pulmonary disease) (HCC)    CVA (cerebral vascular accident) (HCC)  10/26/2010   denies residual on 07/22/2015   ED (erectile dysfunction)    Family history of adverse reaction to anesthesia    think my mother had real bad headaches after anesthesia   GERD (gastroesophageal reflux disease)    Hypercholesterolemia    Hypertension    IFG (impaired fasting glucose)    NSTEMI (non-ST elevated myocardial infarction) (HCC)    Pneumonia ~ 2005    Past Surgical History:  Procedure Laterality Date   APPENDECTOMY  2009 [facility]   BACK SURGERY     BIOPSY  05/04/2014    Procedure: BIOPSY;  Surgeon: Mitchell Oliver Haddock, MD;  [facility];  Service: Endoscopy;;   CARDIAC CATHETERIZATION N/A 07/18/2015   Procedure: Left Heart Cath and Coronary Angiography;  Surgeon: Mitchell LELON Sharps, MD;  Location: [facility] INVASIVE CV LAB;  Service: Cardiovascular;  Laterality: N/A;   CARDIAC CATHETERIZATION N/A 07/18/2015   Procedure: Coronary Stent Intervention;  Surgeon: Mitchell LELON Sharps, MD;  Location: [facility] INVASIVE CV LAB;  Service: Cardiovascular;  Laterality: N/A;   CARDIAC CATHETERIZATION N/A 07/22/2015   Procedure: Coronary Stent Intervention;  Surgeon: Mitchell LELON Sharps, MD;  Location: [facility] INVASIVE CV LAB;  Service: Cardiovascular;  Laterality: N/A;   COLONOSCOPY  2009   COLONOSCOPY N/A 08/04/2019   Procedure: COLONOSCOPY;  Surgeon: Oliver Mitchell LITTIE, MD;  [facility]: [facility];  Service: Endoscopy;  Laterality: N/A;  12:30   ESOPHAGOGASTRODUODENOSCOPY N/A 05/04/2014   Procedure: ESOPHAGOGASTRODUODENOSCOPY (EGD);  Surgeon: Mitchell Oliver Haddock, MD;  [facility];  Service: Endoscopy;  Laterality: N/A;  9:15   ESOPHAGOGASTRODUODENOSCOPY N/A 01/21/2024   Procedure: EGD (ESOPHAGOGASTRODUODENOSCOPY);  Surgeon: Mitchell Carlin POUR, DO;  [facility]: [facility];  Service: Endoscopy;  Laterality: N/A;  11:45AM, ASA 3   IR PARACENTESIS  12/02/2023   IR TRANSCATHETER BX  12/02/2023   POLYPECTOMY  08/04/2019   Procedure: POLYPECTOMY;  Surgeon: Oliver Mitchell LITTIE, MD;  [facility];  Service: Endoscopy;;  hepatic flexure, ascending colon, transverse colon, splenic flexure, descending colon    POSTERIOR LAMINECTOMY / DECOMPRESSION LUMBAR SPINE  06/03/2015   SAVORY DILATION N/A 05/04/2014   Procedure: SAVORY DILATION;  Surgeon: Mitchell Oliver Haddock, MD;  [facility];  Service: Endoscopy;  Laterality: N/A;       Scheduled Meds:  Continuous Infusions:  PRN Meds:   Allergies:    Allergies  Allergen Reactions   Bee Venom Anaphylaxis   Shellfish Allergy Anaphylaxis    Scallops     Lidocaine  Hives and Rash    Lidocaine  Patch (rash) - only around the area where the patch was applied.    Social History:   Social History   Socioeconomic History   Marital status: Divorced    Spouse name: Not on file   Number of children: 3   Years of education: Not on file   Highest education level: Some college, no degree  Occupational History   Occupation: Airline Pilot  Tobacco Use   Smoking status: Former    Current packs/day: 0.00    Average packs/day: 2.0 packs/day for 24.2 years (48.3 ttl pk-yrs)    Types: Cigarettes    Start date: 1963    Quit date: 12/24/1985    Years since quitting: 38.6   Smokeless tobacco: Never  Vaping Use   Vaping status: Never Used  Substance and Sexual Activity   Alcohol use: Yes    Alcohol/week: 6.0 standard drinks of alcohol    Types: 2 Glasses of wine, 4 Cans  of beer per week   Drug use: No   Sexual activity: Yes  Other Topics Concern   Not on file  Social History Narrative   3 daughters, 2 live locally, one lives in [place].   3 grandsons   Active in grandson's travel ball. Works part time for [employer].    Social Drivers of Corporate Investment Banker Strain: Low Risk  (08/11/2024)   Overall Financial Resource Strain (CARDIA)    Difficulty of Paying Living Expenses: Not hard at all  Food Insecurity: No Food Insecurity (08/11/2024)   Hunger Vital Sign    Worried About Running Out of Food in the Last Year: Never true    Ran Out of Food in the Last Year: Never true  Transportation Needs: No Transportation Needs (08/11/2024)   PRAPARE - Administrator, Civil Service (Medical): No    Lack of Transportation (Non-Medical): No  Physical Activity: Inactive (08/11/2024)   Exercise Vital Sign    Days of Exercise per Week: 0 days    Minutes of Exercise per Session: 0 min  Stress: No Stress Concern Present (08/11/2024)   [employer] of Occupational Health - Occupational Stress Questionnaire    Feeling of Stress:  Not at all  Social Connections: Moderately Integrated (08/11/2024)   Social Connection and Isolation Panel    Frequency of Communication with Friends and Family: More than three times a week    Frequency of Social Gatherings with Friends and Family: Twice a week    Attends Religious Services: More than 4 times per year    Active Member of [employer] or Organizations: Yes    Attends Engineer, Structural: More than 4 times per year    Marital Status: Divorced  Intimate Partner Violence: Not At Risk (08/11/2024)   Humiliation, Afraid, Rape, and Kick questionnaire    Fear of Current or Ex-Partner: No    Emotionally Abused: No    Physically Abused: No    Sexually Abused: No    Family History:   Family History  Problem Relation Age of Onset   Hypertension Mother    Hypertension Father    Heart attack Father    Stroke Father    Colon cancer Neg Hx    Colon polyps Neg Hx      ROS:  Please see the history of present illness.  All other ROS reviewed and negative.     Physical Exam/Data: Vitals:   08/22/24 1103 08/22/24 1345 08/22/24 1415 08/22/24 1534  BP:  117/73 120/72 (!) 141/80  Pulse:  73 79 (!) 48  Resp:  20 (!) 21 16  Temp:    98 F (36.7 C)  TempSrc:    Oral  SpO2:  99% 99% 90%  Weight: 96 kg     Height: 6' 2 (1.88 m)      No intake or output data in the 24 hours ending 08/22/24 1708    08/22/2024   11:03 AM 08/11/2024    9:16 AM 06/29/2024   10:58 AM  Last 3 Weights  Weight (lbs) 211 lb 10.3 oz 212 lb 212 lb 6.4 oz  Weight (kg) 96 kg 96.163 kg 96.344 kg     Body mass index is 27.17 kg/m.  General:  Very pleasant older gentlemen sitting upright in no acute distress HEENT: normal Neck: no JVD Vascular: No carotid bruits; Distal pulses 2+ bilaterally Cardiac:  Irregularly irregular, 2/6 systolic murmur without radiation Lungs:  clear to auscultation bilaterally,  no wheezing, rhonchi or rales  Abd: soft, nontender, no hepatomegaly  Ext: no  edema Musculoskeletal:  No deformities, BUE and BLE strength normal and equal Skin: warm and dry  Neuro:  CNs 2-12 intact, no focal abnormalities noted Psych:  Normal affect   EKG:  The EKG was personally reviewed and demonstrates: See HPI Telemetry:  Telemetry was personally reviewed and demonstrates:  sinus rhythm with multiple episodes of PACS vs atrial tachycardia, HR avg ~ 55  Relevant CV Studies: Heart monitor 12/2023  Patch wear time was for 13 days and 23 hours.   Normal sinus rhythm predominantly ranging from 45 to 174 bpm with an average HR 64 bpm.   600 runs of nonsustained SVT occurred, fastest interval lasting 7 beats and the longest interval lasting 34.7 seconds. 1 run of NSVT occurred, lasting 4 beats.   No evidence of atrial fibrillation/atrial flutter, AV block or pauses.   14.9% PAC burden and <1% PVC burden.   No patient triggered events noted.  Echocardiogram 12/2023 IMPRESSIONS     1. Limited study.   2. Left ventricular ejection fraction, by estimation, is 65 to 70%. The  left ventricle has normal function. The left ventricle has no regional  wall motion abnormalities.   3. Right ventricular systolic function is normal. The right ventricular  size is normal.   4. The mitral valve is degenerative. Trivial mitral valve regurgitation.   5. The aortic valve is tricuspid. There is moderate calcification of the  aortic valve. Aortic valve sclerosis/calcification is present, without any  evidence of aortic stenosis. Aortic valve mean gradient measures 9.0 mmHg.   6. Aortic dilatation noted. There is mild dilatation of the aortic root,  measuring 42 mm.    Laboratory Data: High Sensitivity Troponin:   Recent Labs  Lab 08/22/24 1105 08/22/24 1241  TROPONINIHS 15 13     Chemistry Recent Labs  Lab 08/22/24 1105  NA 134*  K 3.3*  CL 105  CO2 19*  GLUCOSE 164*  BUN 10  CREATININE 1.32*  CALCIUM  8.3*  GFRNONAA 55*  ANIONGAP 10    Hematology Recent  Labs  Lab 08/22/24 1105  WBC 9.0  RBC 4.76  HGB 15.5  HCT 44.7  MCV 93.9  MCH 32.6  MCHC 34.7  RDW 16.1*  PLT 161   BNP Recent Labs  Lab 08/22/24 1130  BNP 546.5*    Radiology/Studies:  DG Chest 2 View Result Date: 08/22/2024 EXAM: 2 VIEW(S) XRAY OF THE CHEST 08/22/2024 11:31:00 AM COMPARISON: 06/16/2024 CLINICAL HISTORY: Pt complains of chest pain and SOB worse with exertion, left shoulder and arm pain that has been on and off x 3 days. FINDINGS: LUNGS AND PLEURA: No focal pulmonary opacity. No pulmonary edema. No pleural effusion. No pneumothorax. HEART AND MEDIASTINUM: Aortic arch calcifications. BONES AND SOFT TISSUES: Multilevel thoracic osteophytosis. No acute osseous abnormality. IMPRESSION: 1. No acute cardiopulmonary pathology. Electronically signed by: Norleen Kil MD 08/22/2024 11:49 AM EDT RP Workstation: [id number]     Assessment and Plan: Chest pain Patient denied chest pain. He only experienced bilateral shoulder pain that sounded more MSK in etiology.   DOE Patient is reporting DOE that has been ongoing for several months. He does have a history of emphysema which could be contributing as well as chronic diastolic heart failure and general deconditioning. Patient has close cardiology follow-up which is appropriate for further evaluation of his DOE. Could potentially pursue ischemic evaluation with PET scan as it has been since 2016 with  his LHC. If unrevealing would most likely benefit from a referral to pulmonology.   CAD s/p NSTEMI in 2016 with DES to PDA and staged PCI to Diagonal  EKG without acute ischemic findings Negative troponins Not on aspirin  secondary to chronic anticoagulation See ischemic evaluation above.  Paroxysmal atrial fibrillation (?) SVT Frequent PACs In chart noted to have one episode of atrial fibrillation on telemetry ~ 5 minutes. Heart monitor worn for 14 days showed no AF/AFL. Patient could benefit from another heart monitor to  assess burden for AF/AFL. With his cirrhosis would want to be judicious about anticoagulation use. On coumadin  as DOACs contraindicated in cirrhosis.  Chad Vasc 6 INR 2.9  At recent cardiology appointment patient's Coreg  was discontinued secondary to ongoing fatigue which has now improved. In ED, HR 50-55. Will defer AV blocking agent.  continue Coumadin  7.5 mg for now, readdress outpatient  Chronic diastolic heart failure CXR without evidence of volume overload Mildly elevated BNP 546 Weight stable since cardiology outpatient 06/2024 ~212lb On exam appears euvolemic PTA medication Jardiance  10 mg, benazepril  20 mg, eplerenone 25 mg and  Lasix  40 mg continue at discharge  Hypertension BP: 141/80 Medications as above  Hyperlipidemia Not on statin due to cirrhosis  Mild aortic aneurysm Patient has a pending outpatient CTA.  No indication to obtain inpatient.  Per primary  GERD Cirrhosis Emphysema   Risk Assessment/Risk Scores:       [place]  Heart Association (NYHA) Functional Class NYHA Class II  CHA2DS2-VASc Score = 6   This indicates a 9.7% annual risk of stroke. The patient's score is based upon: CHF History: 1 HTN History: 1 Diabetes History: 1 Stroke History: 0 Vascular Disease History: 1 Age Score: 2 Gender Score: 0       For questions or updates, please contact [facility] Please consult [email or web address] for contact info under      Signed, Leontine LOISE Salen, PA-C  08/22/2024 5:08 PM  78 yo male with history of CAD, cirrhosis, COPD, HTN, HLD, possible atrial fib on coumadin  and chronic diastolic CHF with baseline chronic dyspnea who presented to the ED with c/o dyspnea. No chest pain. He had coronary stenting in 2016 as outlined above in detail. Noted to have atrial fib by EMS in March 2025 but no strips to document this. Cardiac monitor June 2023 with sinus, frequent SVT/atrial tach. No atrial fib. Echo March 2025 with normal LV systolic  function. Aortic sclerosis with mild AS (mean gradient 9 mmHg).   Data:  EKG(s) and pertinent labs, studies, etc were personally reviewed and interpreted by me:  EKG with sinus, PACs  Tele: sinus, PACs  Labs reviewed by me. Troponin negative x 2.  Chest x-ray clear.  Otherwise, I agree with data as outlined by the advanced practice provider.   Exam performed by me:  Gen: NAD  Neck: No JVD  Cardiac: RRR with ectopy, systolic murmur  Lungs: clear bilaterally  Extremities: No LE edema   My Assessment and Plan:   Dyspnea with exertion in 78 yo male with known COPD and CAD. No evidence of ischemia/ACS. Negative troponin. EKG without ischemic changes. No chest pain. No volume overload on exam. He appears comfortable. INR is 2.9.  -He has follow up in our office next week. Can arrange cardiac PET stress test. If no ischemia, consider pulmonary workup.  Atrial fib, paroxysmal: He has not had documented atrial fib in our system. He is on coumadin . Higher bleeding risk with chronic liver disease.  May consider one more cardiac monitor and if no atrial fib, stop coumadin .  OK to d/c home from ED.   Signed,  Lonni Cash, MD, [facility] 08/22/2024  5:10 PM

## 2024-08-22 NOTE — ED Triage Notes (Signed)
 Pt complains of chest pain and SOB worse with exertion, left shoulder and arm pain that has been on and off x 3 days.

## 2024-08-22 NOTE — ED Provider Notes (Signed)
  EMERGENCY DEPARTMENT AT [facility] Provider Note   CSN: [id number] Arrival date & time: 08/22/24  1047     Patient presents with: Chest Pain   Mitchell Oliver is a 78 y.o. male.   Pt with hx afib, hx heart disease/chf, c/o dyspnea w exertion and episodes rapid heart beating, mild chest tightness in the past couple weeks. States heart rate was 160s. Denies recent change in meds, and is compliant w meds. No exertional chest pain or discomfort. No pleuritic pain. Is on coumadin  and compliant w therapy. Denies any new leg pain or swelling. No cough or uri symptoms. No fever or chills.   The history is provided by the patient, medical records and a relative.  Chest Pain Associated symptoms: palpitations and shortness of breath   Associated symptoms: no abdominal pain, no back pain, no cough, no fever, no headache, no nausea and no vomiting        Prior to Admission medications   Medication Sig Start Date End Date Taking? Authorizing Provider  albuterol  (VENTOLIN  HFA) 108 (90 Base) MCG/ACT inhaler INHALE 2 PUFFS INTO THE LUNGS EVERY 6 HOURS AS NEEDED FOR WHEEZING OR SHORTNESS OF BREATH 07/13/23   Cook, Jayce G, DO  benazepril  (LOTENSIN ) 20 MG tablet TAKE 1 TABLET(20 MG) BY MOUTH DAILY 07/17/24   Cook, Jayce G, DO  dorzolamide -timolol  (COSOPT ) 22.3-6.8 MG/ML ophthalmic solution Place 1 drop into both eyes 2 (two) times daily. 03/28/21   [provider]  empagliflozin  (JARDIANCE ) 10 MG TABS tablet Take 1 tablet (10 mg total) by mouth daily. 05/10/24   Cook, Jayce G, DO  eplerenone (INSPRA) 25 MG tablet Take 50 mg by mouth daily.    [provider]  furosemide  (LASIX ) 40 MG tablet Take 1 tablet (40 mg total) by mouth daily. 06/29/24   Mallipeddi, Vishnu P, MD  ipratropium (ATROVENT ) 0.06 % nasal spray Place 2 sprays into both nostrils 4 (four) times daily. 12/06/23   Cook, Jayce G, DO  Iron , Ferrous Sulfate , 325 (65 Fe) MG TABS Take 325 mg by mouth every  Tuesday, Thursday, Saturday, and Sunday. 01/20/24   Pearlean Manus, MD  lactulose  (CHRONULAC ) 10 GM/15ML solution Take 30 mLs (20 g total) by mouth 2 (two) times daily. 01/19/24 01/18/25  Pearlean Manus, MD  latanoprost  (XALATAN ) 0.005 % ophthalmic solution Place 1 drop into both eyes at bedtime. 08/21/23   [provider]  ondansetron  (ZOFRAN ) 4 MG tablet Take 1 tablet (4 mg total) by mouth every 6 (six) hours as needed for nausea. 10/07/23   Maree, Pratik D, DO  pantoprazole  (PROTONIX ) 40 MG tablet Take 1 tablet (40 mg total) by mouth daily. 01/19/24   Pearlean Manus, MD  warfarin (COUMADIN ) 5 MG tablet TAKE 1 TABLET TO 1 AND 1/2 TABLETS BY MOUTH DAILY AS DIRECTED BY COUMADIN  CLINIC 05/04/24   Nahser, Aleene PARAS, MD  zinc  gluconate 50 MG tablet Take 50 mg by mouth daily.    [provider]    Allergies: Bee venom, Shellfish allergy, and Lidocaine     Review of Systems  Constitutional:  Negative for chills and fever.  HENT:  Negative for sore throat.   Respiratory:  Positive for shortness of breath. Negative for cough.   Cardiovascular:  Positive for chest pain and palpitations. Negative for leg swelling.  Gastrointestinal:  Negative for abdominal pain, nausea and vomiting.  Genitourinary:  Negative for flank pain.  Musculoskeletal:  Negative for back pain and neck pain.  Neurological:  Negative  for syncope and headaches.    Updated Vital Signs BP 120/72   Pulse 79   Temp 98.2 F (36.8 C)   Resp (!) 21   Ht 1.88 m (6' 2)   Wt 96 kg   SpO2 99%   BMI 27.17 kg/m   Physical Exam Vitals and nursing note reviewed.  Constitutional:      Appearance: Normal appearance. He is well-developed.  HENT:     Head: Atraumatic.     Nose: Nose normal.     Mouth/Throat:     Mouth: Mucous membranes are moist.     Pharynx: Oropharynx is clear.  Eyes:     General: No scleral icterus.    Conjunctiva/sclera: Conjunctivae normal.  Neck:     Trachea: No tracheal deviation.      Comments: Trachea midline. Thyroid  not grossly enlarged or tender. Cardiovascular:     Rate and Rhythm: Normal rate and regular rhythm.     Pulses: Normal pulses.     Heart sounds: Normal heart sounds. No murmur heard.    No friction rub. No gallop.  Pulmonary:     Effort: Pulmonary effort is normal. No accessory muscle usage or respiratory distress.     Breath sounds: Normal breath sounds.  Abdominal:     General: Bowel sounds are normal. There is no distension.     Palpations: Abdomen is soft.     Tenderness: There is no abdominal tenderness.  Genitourinary:    Comments: No cva tenderness. Musculoskeletal:        General: No swelling or tenderness.     Cervical back: Normal range of motion and neck supple. No rigidity.     Right lower leg: No edema.     Left lower leg: No edema.  Skin:    General: Skin is warm and dry.     Findings: No rash.  Neurological:     Mental Status: He is alert.     Comments: Alert, speech clear.   Psychiatric:        Mood and Affect: Mood normal.     (all labs ordered are listed, but only abnormal results are displayed) Results for orders placed or performed during the hospital encounter of 08/22/24  Basic metabolic panel   Collection Time: 08/22/24 11:05 AM  Result Value Ref Range   Sodium 134 (L) 135 - 145 mmol/L   Potassium 3.3 (L) 3.5 - 5.1 mmol/L   Chloride 105 98 - 111 mmol/L   CO2 19 (L) 22 - 32 mmol/L   Glucose, Bld 164 (H) 70 - 99 mg/dL   BUN 10 8 - 23 mg/dL   Creatinine, Ser 8.67 (H) 0.61 - 1.24 mg/dL   Calcium  8.3 (L) 8.9 - 10.3 mg/dL   GFR, Estimated 55 (L) >60 mL/min   Anion gap 10 5 - 15  CBC   Collection Time: 08/22/24 11:05 AM  Result Value Ref Range   WBC 9.0 4.0 - 10.5 K/uL   RBC 4.76 4.22 - 5.81 MIL/uL   Hemoglobin 15.5 13.0 - 17.0 g/dL   HCT 55.2 60.9 - 47.9 %   MCV 93.9 80.0 - 100.0 fL   MCH 32.6 26.0 - 34.0 pg   MCHC 34.7 30.0 - 36.0 g/dL   RDW 83.8 (H) 88.4 - 84.4 %   Platelets 161 150 - 400 K/uL   nRBC 0.0  0.0 - 0.2 %  Protime-INR (order if Patient is taking Coumadin  / Warfarin)   Collection Time: 08/22/24 11:05 AM  Result Value Ref Range   Prothrombin Time 31.6 (H) 11.4 - 15.2 seconds   INR 2.9 (H) 0.8 - 1.2  Troponin I (High Sensitivity)   Collection Time: 08/22/24 11:05 AM  Result Value Ref Range   Troponin I (High Sensitivity) 15 <18 ng/L  Brain natriuretic peptide   Collection Time: 08/22/24 11:30 AM  Result Value Ref Range   B Natriuretic Peptide 546.5 (H) 0.0 - 100.0 pg/mL  Troponin I (High Sensitivity)   Collection Time: 08/22/24 12:41 PM  Result Value Ref Range   Troponin I (High Sensitivity) 13 <18 ng/L   DG Chest 2 View Result Date: 08/22/2024 EXAM: 2 VIEW(S) XRAY OF THE CHEST 08/22/2024 11:31:00 AM COMPARISON: 06/16/2024 CLINICAL HISTORY: Pt complains of chest pain and SOB worse with exertion, left shoulder and arm pain that has been on and off x 3 days. FINDINGS: LUNGS AND PLEURA: No focal pulmonary opacity. No pulmonary edema. No pleural effusion. No pneumothorax. HEART AND MEDIASTINUM: Aortic arch calcifications. BONES AND SOFT TISSUES: Multilevel thoracic osteophytosis. No acute osseous abnormality. IMPRESSION: 1. No acute cardiopulmonary pathology. Electronically signed by: Norleen Kil MD 08/22/2024 11:49 AM EDT RP Workstation: [id number]    EKG: EKG Interpretation Date/Time:  Tuesday August 22 2024 10:57:48 EDT Ventricular Rate:  57 PR Interval:    QRS Duration:  86 QT Interval:  442 QTC Calculation: 430 R Axis:   38  Text Interpretation: Sinus rhythm Premature atrial complexes Confirmed by Bernard Drivers ([id number]) on 08/22/2024 11:39:41 AM  Radiology: DG Chest 2 View Result Date: 08/22/2024 EXAM: 2 VIEW(S) XRAY OF THE CHEST 08/22/2024 11:31:00 AM COMPARISON: 06/16/2024 CLINICAL HISTORY: Pt complains of chest pain and SOB worse with exertion, left shoulder and arm pain that has been on and off x 3 days. FINDINGS: LUNGS AND PLEURA: No focal pulmonary opacity. No  pulmonary edema. No pleural effusion. No pneumothorax. HEART AND MEDIASTINUM: Aortic arch calcifications. BONES AND SOFT TISSUES: Multilevel thoracic osteophytosis. No acute osseous abnormality. IMPRESSION: 1. No acute cardiopulmonary pathology. 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 Labs: ordered. Decision-making details documented in ED Course. Radiology: ordered and independent interpretation performed. Decision-making details documented in ED Course. ECG/medicine tests: ordered and independent interpretation performed. Decision-making details documented in ED Course. Discussion of management or test interpretation with external provider(s): cardiology  Risk Prescription drug management. Decision regarding hospitalization.   Iv ns. Continuous pulse ox and cardiac monitoring. Labs ordered/sent. Imaging ordered.   Differential diagnosis includes afib/rvr, chf, acs, etc. Dispo decision including potential need for admission  considered - will get labs and imaging and reassess.    Reviewed nursing notes and prior charts for additional history. External reports reviewed. Additional history from: family.   Cardiology consulted - they indicate will see in ED.   Cardiac monitor: sinus rhythm, rate 56.  Labs reviewed/interpreted by me - wbc 9, hgb 15. Chem w k mildly low. Kcl po. Bnp mild-mod elevated. Trop x 2 normal and not increasing.   Xrays reviewed/interpreted by me - no pna or edema.   Recheck pt, no chest pain.   Cardiology eval pending.   1518, cardiology eval pending - signed out to Dr Pamella to f/u with cardiology eval/recs, and dispo appropriately.        Final diagnoses:  Palpitations  History of atrial fibrillation  Chest tightness  Dyspnea on exertion  Elevated brain natriuretic peptide (BNP) level  Hypokalemia    ED Discharge Orders     None          Bernard Drivers, MD 08/22/24 1524

## 2024-08-23 ENCOUNTER — Other Ambulatory Visit: Payer: Self-pay | Admitting: Internal Medicine

## 2024-08-23 DIAGNOSIS — K7460 Unspecified cirrhosis of liver: Secondary | ICD-10-CM

## 2024-08-27 NOTE — Assessment & Plan Note (Signed)
 Recent trip to the ED for dyspnea on exertion. ACS was ruled out. Volume status was stable. It was recommended he undergo outpt Stress PET MPI. This has not yet been arranged. If he has no ischemia, he will need a pulmonology referral. He is bradycardic. He is off of his beta-blocker. I did walk him in the office today. His O2 sats did not drop significantly and his HR increased appropriately ( O2 95%, HR 42 at rest >> O2 91%, HR 99 with ambulation).  - Arrange PET MPI - If low risk, will need referral to Pulmonology.

## 2024-08-27 NOTE — Progress Notes (Unsigned)
 OFFICE NOTE:    Date:  08/28/2024  ID:  [name], DOB March 27, 1946, MRN [id number] PCP: Bluford Jacqulyn MATSU, DO  [facility] Providers Cardiologist:  Lonni Cash, MD        Coronary artery disease  NSTEMI 9/16: s/p 2.75 x 12 mm DES pRPDA Staged PCI 07/22/15: s/p 2.75 x 12 mm DES to D1 (50 residual post PCI) LHC 07/22/15: RCA 40; LCx 60, OM1 65 Paroxysmal atrial fibrillation  Dx in 12/2023 - AFib noted on EMS strips (not available) Monitor 01/2024: 600 runs of nonsustained SVT (longest 34.7 sec); no AFib/Flutter Pt on Coumadin  Rx  (HFpEF) heart failure with preserved ejection fraction  TTE 09/22/23: EF 55-60, no RWMA, NL RVSF, mod LAE, mild RAE, mild MR, AV sclerosis, aortic root 39 mm Limited TTE 01/19/24: EF 65-70, no RWMA, NL RVSF, trivial MR, AV sclerosis (mean 9), aortic root 42 mm Aortic root dilation TTE 12/2023: 42 mm  Hypertension  Diabetes mellitus Type 2 Hyperlipidemia  Hx of CVA Cirrhosis  OSA Chronic Obstructive Pulmonary Disease         Discussed the use of AI scribe software for clinical note transcription with the patient, who gave verbal consent to proceed. History of Present Illness Mitchell Oliver is a 78 y.o. male for post ED follow up. He is a prior pt of Dr. Claudene, then Dr. Alveta. He was established with Dr. Mallipeddi after Dr. Allena retirement. He saw Dr. Mallipeddi in 06/2024 for dyspnea on exertion. Lasix  dose was increased. Coreg  was stopped due to fatigue. He went to the ED on 08/22/24 with shortness of breath. He did not feel that increasing Lasix  helped. Stopping Coreg  improved some symptoms. He was seen by Cardiology. Troponins were neg x 2. EKG did not show ischemic changes. Patient had no chest pain. He was not felt to be volume overloaded and there was no evidence of ACS. OP PET MPI recommended. If no ischemia, refer to pulmonology. It was also recommended to consider another monitor and if no documented AFib, consider stopping  Coumadin  (higher bleeding risk with liver disease).   He experiences shortness of breath, particularly with exertion such as walking long distances or performing activities like getting dressed or bathing. His breathing issues have not improved with an increased dose of diuretics. He was recently seen in the emergency room. His shortness of breath has remained the same since the emergency room visit. He has not experienced chest discomfort suggestive of angina recently. He has been off carvedilol  for a couple of weeks and reports feeling like a 'new person' after discontinuation. He is back on his usual dose of Lasix . He reports a chronic cough, which he attributes to sinus problems, and denies any recent changes in this symptom. No orthopnea, but he prefers not to lie flat. No recent weight gain, swelling, chest pain, or syncope. He occasionally feels lightheaded, especially after exertion.    ROS-See HPI    Studies Reviewed:  EKG Interpretation Date/Time:  Monday August 28 2024 11:58:11 EST Ventricular Rate:  43 PR Interval:    QRS Duration:  88 QT Interval:  456 QTC Calculation: 385 R Axis:   21  Text Interpretation: Sinus bradycardia Inferior Q waves noted Confirmed by Lelon Hamilton [phone]) on 08/28/2024 12:07:41 PM    Labs  01/19/24: TSH 2.268 06/14/24: ALT 34 08/22/24: K 3.3, Na 134, SCr 1.32, eGFR 55, hsTrop 15 - 13, BNP 546.5, Hgb 15.5, PLT 161K, INR 2.9,    Risk  Assessment/Calculations: CHA2DS2-VASc Score = 6   This indicates a 9.7% annual risk of stroke. The patient's score is based upon: CHF History: 1 HTN History: 1 Diabetes History: 1 Stroke History: 0 Vascular Disease History: 1 Age Score: 2 Gender Score: 0        STOP-Bang Score:  5     Physical Exam:  VS:  BP 124/64   Pulse (!) 48   Ht 6' 2 (1.88 m)   Wt 216 lb (98 kg)   SpO2 96%   BMI 27.73 kg/m        Wt Readings from Last 3 Encounters:  08/28/24 216 lb (98 kg)  08/22/24 211 lb 10.3 oz (96 kg)   08/11/24 212 lb (96.2 kg)    Constitutional:      Appearance: Healthy appearance. Not in distress.  Neck:     Vascular: No JVR.  Pulmonary:     Breath sounds: Normal breath sounds. No wheezing. No rales.  Cardiovascular:     Bradycardia present. Regular rhythm.     Murmurs: There is a grade 2/6 systolic murmur at the URSB.  Edema:    Peripheral edema absent.  Abdominal:     Palpations: Abdomen is soft.       Assessment and Plan:    Assessment & Plan DOE (dyspnea on exertion) Recent trip to the ED for dyspnea on exertion. ACS was ruled out. Volume status was stable. It was recommended he undergo outpt Stress PET MPI. This has not yet been arranged. If he has no ischemia, he will need a pulmonology referral. He is bradycardic. He is off of his beta-blocker. I did walk him in the office today. His O2 sats did not drop significantly and his HR increased appropriately ( O2 95%, HR 42 at rest >> O2 91%, HR 99 with ambulation).  - Arrange PET MPI - If low risk, will need referral to Pulmonology. CAD S/P percutaneous coronary angioplasty Hx of NSTEMI in 2016 tx with DES to RPDA and staged PCI with DES to D1.  He has recently been evaluated for shortness of breath with exertion.  He has not had chest pain.  He did have chest symptoms with his myocardial infarction in 2016.  Recent workup has been unremarkable.  He has not had improvement with diuresis. -Arrange PET MPI as noted -Follow-up 50-month Paroxysmal A-fib (HCC) Dx in 12/2023. No documentation available. AFib noted by EMS. Pt started on anticoagulation. Follow up monitor with no AFib. Pt seen by Dr. Verlin in the ED who recommended considering a repeat monitor and if neg for AFib, stop anticoagulation (pt higher risk for bleeding with liver disease).  -Continue Coumadin  -Arrange 30-day event monitor Chronic heart failure with preserved ejection fraction (HFpEF) (HCC) Echocardiogram March 2025 with normal EF.  Volume status appears  stable on exam.  He is NYHA IIb-III.  He has not had improvement in his shortness of breath with diuresis.  As noted, stress testing will be arranged. -Continue Jardiance  10 mg daily, eplerenone 50 mg daily, Lasix  40 mg daily Bradycardia He was taken off of carvedilol  recently secondary to profound fatigue.  Fatigue improved.  However, he still has significant bradycardia at rest.  Rhythm strip in the office today did demonstrate junctional rhythm at times.  As noted, I ambulated him in the office today.  He had appropriate increase in his heart rate.  I am still concerned that his heart rate may be contributing to some of his symptoms.  Repeat event monitor  will be arranged as noted.  If he has evidence of pauses, high degree heart block, he will need referral to EP.   Informed Consent   Shared Decision Making/Informed Consent The risks [chest pain, shortness of breath, cardiac arrhythmias, dizziness, blood pressure fluctuations, myocardial infarction, stroke/transient ischemic attack, nausea, vomiting, allergic reaction, radiation exposure, metallic taste sensation and life-threatening complications (estimated to be 1 in 10,000)], benefits (risk stratification, diagnosing coronary artery disease, treatment guidance) and alternatives of a cardiac PET stress test were discussed in detail with Mitchell Oliver and he agrees to proceed.  ADDENDUM 09/01/2024  PET/CT MPI 08/30/2024: No infarction, apical to mid anterolateral ischemia (distribution of diagonal), EF 55, global MBFR 1.7 (abnormal), intermediate risk Reviewed abnormal stress test with Dr. Verlin.  Given patient's symptoms, we agree that the patient should undergo a right and left heart catheterization for further evaluation.  I contacted the patient by phone and discussed this with him.  He agrees to proceed.  Informed Consent   Shared Decision Making/Informed Consent The risks [stroke (1 in 1000), death (1 in 1000), kidney failure [usually  temporary] (1 in 500), bleeding (1 in 200), allergic reaction [possibly serious] (1 in 200)], benefits (diagnostic support and management of coronary artery disease) and alternatives of a cardiac catheterization were discussed in detail with Mitchell Oliver and he is willing to proceed.         Dispo:  Return in about 3 months (around 11/28/2024) for Routine Follow Up, w/ Dr. Verlin, or Glendia Ferrier, PA-C.  Signed, Glendia Ferrier, PA-C

## 2024-08-27 NOTE — Assessment & Plan Note (Signed)
 Dx in 12/2023. No documentation available. AFib noted by EMS. Pt started on anticoagulation. Follow up monitor with no AFib. Pt seen by Dr. Verlin in the ED who recommended considering a repeat monitor and if neg for AFib, stop anticoagulation (pt higher risk for bleeding with liver disease). ***

## 2024-08-27 NOTE — H&P (View-Only) (Signed)
 OFFICE NOTE:    Date:  08/28/2024  ID:  Mitchell Oliver, DOB March 27, 1946, MRN [id number] PCP: Bluford Jacqulyn MATSU, DO  [facility] Providers Cardiologist:  Lonni Cash, MD        Coronary artery disease  NSTEMI 9/16: s/p 2.75 x 12 mm DES pRPDA Staged PCI 07/22/15: s/p 2.75 x 12 mm DES to D1 (50 residual post PCI) LHC 07/22/15: RCA 40; LCx 60, OM1 65 Paroxysmal atrial fibrillation  Dx in 12/2023 - AFib noted on EMS strips (not available) Monitor 01/2024: 600 runs of nonsustained SVT (longest 34.7 sec); no AFib/Flutter Pt on Coumadin  Rx  (HFpEF) heart failure with preserved ejection fraction  TTE 09/22/23: EF 55-60, no RWMA, NL RVSF, mod LAE, mild RAE, mild MR, AV sclerosis, aortic root 39 mm Limited TTE 01/19/24: EF 65-70, no RWMA, NL RVSF, trivial MR, AV sclerosis (mean 9), aortic root 42 mm Aortic root dilation TTE 12/2023: 42 mm  Hypertension  Diabetes mellitus Type 2 Hyperlipidemia  Hx of CVA Cirrhosis  OSA Chronic Obstructive Pulmonary Disease         Discussed the use of AI scribe software for clinical note transcription with the patient, who gave verbal consent to proceed. History of Present Illness Mitchell Oliver is a 78 y.o. male for post ED follow up. He is a prior pt of Dr. Claudene, then Dr. Alveta. He was established with Dr. Mallipeddi after Dr. Allena retirement. He saw Dr. Mallipeddi in 06/2024 for dyspnea on exertion. Lasix  dose was increased. Coreg  was stopped due to fatigue. He went to the ED on 08/22/24 with shortness of breath. He did not feel that increasing Lasix  helped. Stopping Coreg  improved some symptoms. He was seen by Cardiology. Troponins were neg x 2. EKG did not show ischemic changes. Patient had no chest pain. He was not felt to be volume overloaded and there was no evidence of ACS. OP PET MPI recommended. If no ischemia, refer to pulmonology. It was also recommended to consider another monitor and if no documented AFib, consider stopping  Coumadin  (higher bleeding risk with liver disease).   He experiences shortness of breath, particularly with exertion such as walking long distances or performing activities like getting dressed or bathing. His breathing issues have not improved with an increased dose of diuretics. He was recently seen in the emergency room. His shortness of breath has remained the same since the emergency room visit. He has not experienced chest discomfort suggestive of angina recently. He has been off carvedilol  for a couple of weeks and reports feeling like a 'new person' after discontinuation. He is back on his usual dose of Lasix . He reports a chronic cough, which he attributes to sinus problems, and denies any recent changes in this symptom. No orthopnea, but he prefers not to lie flat. No recent weight gain, swelling, chest pain, or syncope. He occasionally feels lightheaded, especially after exertion.    ROS-See HPI    Studies Reviewed:  EKG Interpretation Date/Time:  Monday August 28 2024 11:58:11 EST Ventricular Rate:  43 PR Interval:    QRS Duration:  88 QT Interval:  456 QTC Calculation: 385 R Axis:   21  Text Interpretation: Sinus bradycardia Inferior Q waves noted Confirmed by Lelon Hamilton [phone]) on 08/28/2024 12:07:41 PM    Labs  01/19/24: TSH 2.268 06/14/24: ALT 34 08/22/24: K 3.3, Na 134, SCr 1.32, eGFR 55, hsTrop 15 - 13, BNP 546.5, Hgb 15.5, PLT 161K, INR 2.9,    Risk  Assessment/Calculations: CHA2DS2-VASc Score = 6   This indicates a 9.7% annual risk of stroke. The patient's score is based upon: CHF History: 1 HTN History: 1 Diabetes History: 1 Stroke History: 0 Vascular Disease History: 1 Age Score: 2 Gender Score: 0        STOP-Bang Score:  5     Physical Exam:  VS:  BP 124/64   Pulse (!) 48   Ht 6' 2 (1.88 m)   Wt 216 lb (98 kg)   SpO2 96%   BMI 27.73 kg/m        Wt Readings from Last 3 Encounters:  08/28/24 216 lb (98 kg)  08/22/24 211 lb 10.3 oz (96 kg)   08/11/24 212 lb (96.2 kg)    Constitutional:      Appearance: Healthy appearance. Not in distress.  Neck:     Vascular: No JVR.  Pulmonary:     Breath sounds: Normal breath sounds. No wheezing. No rales.  Cardiovascular:     Bradycardia present. Regular rhythm.     Murmurs: There is a grade 2/6 systolic murmur at the URSB.  Edema:    Peripheral edema absent.  Abdominal:     Palpations: Abdomen is soft.       Assessment and Plan:    Assessment & Plan DOE (dyspnea on exertion) Recent trip to the ED for dyspnea on exertion. ACS was ruled out. Volume status was stable. It was recommended he undergo outpt Stress PET MPI. This has not yet been arranged. If he has no ischemia, he will need a pulmonology referral. He is bradycardic. He is off of his beta-blocker. I did walk him in the office today. His O2 sats did not drop significantly and his HR increased appropriately ( O2 95%, HR 42 at rest >> O2 91%, HR 99 with ambulation).  - Arrange PET MPI - If low risk, will need referral to Pulmonology. CAD S/P percutaneous coronary angioplasty Hx of NSTEMI in 2016 tx with DES to RPDA and staged PCI with DES to D1.  He has recently been evaluated for shortness of breath with exertion.  He has not had chest pain.  He did have chest symptoms with his myocardial infarction in 2016.  Recent workup has been unremarkable.  He has not had improvement with diuresis. -Arrange PET MPI as noted -Follow-up 50-month Paroxysmal A-fib (HCC) Dx in 12/2023. No documentation available. AFib noted by EMS. Pt started on anticoagulation. Follow up monitor with no AFib. Pt seen by Dr. Verlin in the ED who recommended considering a repeat monitor and if neg for AFib, stop anticoagulation (pt higher risk for bleeding with liver disease).  -Continue Coumadin  -Arrange 30-day event monitor Chronic heart failure with preserved ejection fraction (HFpEF) (HCC) Echocardiogram March 2025 with normal EF.  Volume status appears  stable on exam.  He is NYHA IIb-III.  He has not had improvement in his shortness of breath with diuresis.  As noted, stress testing will be arranged. -Continue Jardiance  10 mg daily, eplerenone 50 mg daily, Lasix  40 mg daily Bradycardia He was taken off of carvedilol  recently secondary to profound fatigue.  Fatigue improved.  However, he still has significant bradycardia at rest.  Rhythm strip in the office today did demonstrate junctional rhythm at times.  As noted, I ambulated him in the office today.  He had appropriate increase in his heart rate.  I am still concerned that his heart rate may be contributing to some of his symptoms.  Repeat event monitor  will be arranged as noted.  If he has evidence of pauses, high degree heart block, he will need referral to EP.   Informed Consent   Shared Decision Making/Informed Consent The risks [chest pain, shortness of breath, cardiac arrhythmias, dizziness, blood pressure fluctuations, myocardial infarction, stroke/transient ischemic attack, nausea, vomiting, allergic reaction, radiation exposure, metallic taste sensation and life-threatening complications (estimated to be 1 in 10,000)], benefits (risk stratification, diagnosing coronary artery disease, treatment guidance) and alternatives of a cardiac PET stress test were discussed in detail with Mr. Quezada and he agrees to proceed.  ADDENDUM 09/01/2024  PET/CT MPI 08/30/2024: No infarction, apical to mid anterolateral ischemia (distribution of diagonal), EF 55, global MBFR 1.7 (abnormal), intermediate risk Reviewed abnormal stress test with Dr. Verlin.  Given patient's symptoms, we agree that the patient should undergo a right and left heart catheterization for further evaluation.  I contacted the patient by phone and discussed this with him.  He agrees to proceed.  Informed Consent   Shared Decision Making/Informed Consent The risks [stroke (1 in 1000), death (1 in 1000), kidney failure [usually  temporary] (1 in 500), bleeding (1 in 200), allergic reaction [possibly serious] (1 in 200)], benefits (diagnostic support and management of coronary artery disease) and alternatives of a cardiac catheterization were discussed in detail with Mr. Millirons and he is willing to proceed.         Dispo:  Return in about 3 months (around 11/28/2024) for Routine Follow Up, w/ Dr. Verlin, or Glendia Ferrier, PA-C.  Signed, Glendia Ferrier, PA-C

## 2024-08-27 NOTE — Assessment & Plan Note (Signed)
 Hx of NSTEMI in 2016 tx with DES to RPDA and staged PCI with DES to D1. ***

## 2024-08-28 ENCOUNTER — Telehealth: Payer: Self-pay | Admitting: Physician Assistant

## 2024-08-28 ENCOUNTER — Ambulatory Visit: Attending: Physician Assistant | Admitting: Physician Assistant

## 2024-08-28 ENCOUNTER — Other Ambulatory Visit: Payer: Self-pay

## 2024-08-28 ENCOUNTER — Encounter: Payer: Self-pay | Admitting: Physician Assistant

## 2024-08-28 ENCOUNTER — Encounter: Payer: Self-pay | Admitting: *Deleted

## 2024-08-28 VITALS — BP 124/64 | HR 48 | Ht 74.0 in | Wt 216.0 lb

## 2024-08-28 DIAGNOSIS — Z9861 Coronary angioplasty status: Secondary | ICD-10-CM

## 2024-08-28 DIAGNOSIS — I480 Paroxysmal atrial fibrillation: Secondary | ICD-10-CM | POA: Diagnosis not present

## 2024-08-28 DIAGNOSIS — R9439 Abnormal result of other cardiovascular function study: Secondary | ICD-10-CM

## 2024-08-28 DIAGNOSIS — R0609 Other forms of dyspnea: Secondary | ICD-10-CM

## 2024-08-28 DIAGNOSIS — I5032 Chronic diastolic (congestive) heart failure: Secondary | ICD-10-CM | POA: Diagnosis not present

## 2024-08-28 DIAGNOSIS — R001 Bradycardia, unspecified: Secondary | ICD-10-CM

## 2024-08-28 DIAGNOSIS — I2510 Atherosclerotic heart disease of native coronary artery without angina pectoris: Secondary | ICD-10-CM

## 2024-08-28 NOTE — Telephone Encounter (Signed)
 Lvm on pt's daughter's, Nat Saba, phone in attempts to sched stress test ordered by Glendia Ferrier.   JB, 08-28-24

## 2024-08-28 NOTE — Progress Notes (Signed)
 Patient enrolled for [employer]/ [employer] to ship a 30 day cardiac event monitor to his address on file. Dr. Diannah Mallipeddi to read.

## 2024-08-28 NOTE — Assessment & Plan Note (Signed)
 Echocardiogram March 2025 with normal EF.  Volume status appears stable on exam.  He is NYHA IIb-III.  He has not had improvement in his shortness of breath with diuresis.  As noted, stress testing will be arranged. -Continue Jardiance  10 mg daily, eplerenone 50 mg daily, Lasix  40 mg daily

## 2024-08-28 NOTE — Patient Instructions (Addendum)
 Medication Instructions:  Your physician recommends that you continue on your current medications as directed. Please refer to the Current Medication list given to you today.  *If you need a refill on your cardiac medications before your next appointment, please call your pharmacy*  Lab Work: None ordered If you have labs (blood work) drawn today and your tests are completely normal, you will receive your results only by: MyChart Message (if you have MyChart) OR A paper copy in the mail If you have any lab test that is abnormal or we need to change your treatment, we will call you to review the results.  Testing/Procedures:  Your provider has ordered the following tests: Exercise Stress Test Cardiac PET Scan  Your provider has requested you wear a 30 day Event Monitor  Exercise Stress Test - DO NOT EAT, DRINK, OR USE TOBACCO PRODUCTS WITHIN 4 HOURS OF THE TEST - DRESS PREPARED TO EXERCISE IN A COMFORTABLE, 2 PIECE CLOTHING OUTFIT AND WALKING SHOES - BRING ANY PRESCRIPTION MEDICATIONS WITH YOU  - NOTIFY THE OFFICE 24 HOURS IN ADVANCE IF YOU NEED TO CANCEL - CALL THE OFFICE [phone] IF YOU HAVE ANY QUESTIONS  [employer] Cardiac Event Monitor Instructions  Your physician has requested you wear your cardiac event monitor for __30__ days. [employer] may call or text to confirm a shipping address. The monitor will be sent to a land address via UPS. [employer] will not ship a monitor to a PO BOX. It typically takes 3-5 days to receive your monitor after it has been enrolled. [employer] will assist with USPS tracking if your package is delayed. The telephone number for [employer] is [phone]. Once you have received your monitor, please review the enclosed instructions. Instruction tutorials can also be viewed under help and settings on the enclosed cell phone. Your monitor has already been registered assigning a specific monitor serial # to you.  Billing and Self Pay Discount  Information  [employer] has been provided the insurance information we had on file for you.  If your insurance has been updated, please call [employer] at [phone] to provide them with your updated insurance information.   [employer] offers a discounted Self Pay option for patients who have insurance that does not cover their cardiac event monitor or patients without insurance.  The discounted cost of a Self Pay Cardiac Event Monitor would be $225.00 , if the patient contacts [employer] at [phone] within 7 days of applying the monitor to make payment arrangements.  If the patient does not contact [employer] within 7 days of applying the monitor, the cost of the cardiac event monitor will be $350.00.  Applying the monitor  Remove cell phone from case and turn it on. The cell phone works as it consultant and needs to be within [employer] of you at all times. The cell phone will need to be charged on a daily basis. We recommend you plug the cell phone into the enclosed charger at your bedside table every night.  Monitor batteries: You will receive two monitor batteries labelled #1 and #2. These are your recorders. Plug battery #2 onto the second connection on the enclosed charger. Keep one battery on the charger at all times. This will keep the monitor battery deactivated. It will also keep it fully charged for when you need to switch your monitor batteries. A small light will be blinking on the battery emblem when it is charging. The light on the battery emblem will remain on when the battery is fully charged.  Open package of a Monitor strip. Insert battery #1 into black hood on strip and gently squeeze monitor battery onto connection as indicated in instruction booklet. Set aside while preparing skin.  Choose location for your strip, vertical or horizontal, as indicated in the instruction booklet. Shave to remove all hair from location. There cannot be any lotions, oils, powders, or  colognes on skin where monitor is to be applied. Wipe skin clean with enclosed Saline wipe. Dry skin completely.  Peel paper labeled #1 off the back of the Monitor strip exposing the adhesive. Place the monitor on the chest in the vertical or horizontal position shown in the instruction booklet. One arrow on the monitor strip must be pointing upward. Carefully remove paper labeled #2, attaching remainder of strip to your skin. Try not to create any folds or wrinkles in the strip as you apply it.  Firmly press and release the circle in the center of the monitor battery. You will hear a small beep. This is turning the monitor battery on. The heart emblem on the monitor battery will light up every 5 seconds if the monitor battery in turned on and connected to the patient securely. Do not push and hold the circle down as this turns the monitor battery off. The cell phone will locate the monitor battery. A screen will appear on the cell phone checking the connection of your monitor strip. This may read poor connection initially but change to good connection within the next minute. Once your monitor accepts the connection you will hear a series of 3 beeps followed by a climbing crescendo of beeps. A screen will appear on the cell phone showing the two monitor strip placement options. Touch the picture that demonstrates where you applied the monitor strip.  Your monitor strip and battery are waterproof. You are able to shower, bathe, or swim with the monitor on. They just ask you do not submerge deeper than 3 feet underwater. We recommend removing the monitor if you are swimming in a lake, river, or ocean.  Your monitor battery will need to be switched to a fully charged monitor battery approximately once a week. The cell phone will alert you of an action which needs to be made.  On the cell phone, tap for details to reveal connection status, monitor battery status, and cell phone battery status.  The green dots indicates your monitor is in good status. A red dot indicates there is something that needs your attention.  To record a symptom, click the circle on the monitor battery. In 30-60 seconds a list of symptoms will appear on the cell phone. Select your symptom and tap save. Your monitor will record a sustained or significant arrhythmia regardless of you clicking the button. Some patients do not feel the heart rhythm irregularities. [employer] will notify us  of any serious or critical events.  Refer to instruction booklet for instructions on switching batteries, changing strips, the Do not disturb or Pause features, or any additional questions.  Call [employer] at [phone], to confirm your monitor is transmitting and record your baseline. They will answer any questions you may have regarding the monitor instructions at that time.  Returning the monitor to [employer]  Place all equipment back into blue box. Peel off strip of paper to expose adhesive and close box securely. There is a prepaid UPS shipping label on this box. Drop in a UPS drop box, or at a UPS facility like [employer]. You may also contact [employer] to arrange UPS  to pick up monitor package at your home.  Follow-Up: At [facility], you and your health needs are our priority.  As part of our continuing mission to provide you with exceptional heart care, our providers are all part of one team.  This team includes your primary Cardiologist (physician) and Advanced Practice Providers or APPs (Physician Assistants and Nurse Practitioners) who all work together to provide you with the care you need, when you need it.  Your next appointment:   3 month(s)  Provider:   Lonni Cash, MD    We recommend signing up for the patient portal called MyChart.  Sign up information is provided on this After Visit Summary.  MyChart is used to connect with patients for Virtual Visits (Telemedicine).  Patients  are able to view lab/test results, encounter notes, upcoming appointments, etc.  Non-urgent messages can be sent to your provider as well.   To learn more about what you can do with MyChart, go to [email or web address].   Other Instructions    Please report to Radiology at the [facility] Main Entrance 30 minutes early for your test.  [address] [place], [place] [address]     How to Prepare for Your Cardiac PET/CT Stress Test:  Nothing to eat or drink, except water , 3 hours prior to arrival time.  NO caffeine/decaffeinated products, or chocolate 12 hours prior to arrival. (Please note decaffeinated beverages (teas/coffees) still contain caffeine).  If you have caffeine within 12 hours prior, the test will need to be rescheduled.  Medication instructions: None  Diabetic Preparation: If able to eat breakfast prior to 3 hour fasting, you may take all medications, including your insulin. Do not worry if you miss your breakfast dose of insulin - start at your next meal. If you do not eat prior to 3 hour fast-Hold all diabetes (oral and insulin) medications. Patients who wear a continuous glucose monitor MUST remove the device prior to scanning.  You may take your remaining medications with water .  NO perfume, cologne or lotion on chest or abdomen area.  Total time is 1 to 2 hours; you may want to bring reading material for the waiting time.  In preparation for your appointment, medication and supplies will be purchased.  Appointment availability is limited, so if you need to cancel or reschedule, please call the Radiology Department Scheduler at [phone] 24 hours in advance to avoid a cancellation fee of $100.00  What to Expect When you Arrive:  Once you arrive and check in for your appointment, you will be taken to a preparation room within the Radiology Department.  A technologist or Nurse will obtain your medical history, verify that you are correctly prepped for  the exam, and explain the procedure.  Afterwards, an IV will be started in your arm and electrodes will be placed on your skin for EKG monitoring during the stress portion of the exam. Then you will be escorted to the PET/CT scanner.  There, staff will get you positioned on the scanner and obtain a blood pressure and EKG.  During the exam, you will continue to be connected to the EKG and blood pressure machines.  A small, safe amount of a radioactive tracer will be injected in your IV to obtain a series of pictures of your heart along with an injection of a stress agent.    After your Exam:  It is recommended that you eat a meal and drink a caffeinated beverage to counter act any  effects of the stress agent.  Drink plenty of fluids for the remainder of the day and urinate frequently for the first couple of hours after the exam.  Your doctor will inform you of your test results within 7-10 business days.  For more information and frequently asked questions, please visit our website: [email or web address]  For questions about your test or how to prepare for your test, please call: Cardiac Imaging Nurse Navigators Office: [phone]

## 2024-08-28 NOTE — Telephone Encounter (Signed)
 Pt was to be set up for Stress PET, not a Myoview.  I have canceled the Myoview for tomorrow and sent a message to the nurse navigator to schedule the stress PET. Thanks Glendia Ferrier, PA-C    08/28/2024 5:05 PM

## 2024-08-29 ENCOUNTER — Encounter (HOSPITAL_COMMUNITY)

## 2024-08-29 ENCOUNTER — Encounter (HOSPITAL_COMMUNITY): Payer: Self-pay

## 2024-08-29 ENCOUNTER — Other Ambulatory Visit (HOSPITAL_BASED_OUTPATIENT_CLINIC_OR_DEPARTMENT_OTHER): Payer: Self-pay

## 2024-08-29 LAB — COMPREHENSIVE METABOLIC PANEL WITH GFR
ALT: 31 IU/L (ref 0–44)
AST: 43 IU/L — ABNORMAL HIGH (ref 0–40)
Albumin: 3.2 g/dL — ABNORMAL LOW (ref 3.8–4.8)
Alkaline Phosphatase: 113 IU/L (ref 47–123)
BUN/Creatinine Ratio: 7 — ABNORMAL LOW (ref 10–24)
BUN: 9 mg/dL (ref 8–27)
Bilirubin Total: 4.4 mg/dL — ABNORMAL HIGH (ref 0.0–1.2)
CO2: 22 mmol/L (ref 20–29)
Calcium: 9 mg/dL (ref 8.6–10.2)
Chloride: 100 mmol/L (ref 96–106)
Creatinine, Ser: 1.27 mg/dL (ref 0.76–1.27)
Globulin, Total: 3.2 g/dL (ref 1.5–4.5)
Glucose: 129 mg/dL — ABNORMAL HIGH (ref 70–99)
Potassium: 3.9 mmol/L (ref 3.5–5.2)
Sodium: 136 mmol/L (ref 134–144)
Total Protein: 6.4 g/dL (ref 6.0–8.5)
eGFR: 57 mL/min/1.73 — ABNORMAL LOW (ref >59)

## 2024-08-29 MED ORDER — SPIRONOLACTONE 100 MG PO TABS: 100.0000 mg | ORAL_TABLET | Freq: Every day | ORAL | 11 refills | Status: DC

## 2024-08-29 MED ORDER — RIFAXIMIN 550 MG PO TABS: 550.0000 mg | ORAL_TABLET | Freq: Two times a day (BID) | ORAL | 11 refills | Status: DC

## 2024-08-29 MED ORDER — FUROSEMIDE 40 MG PO TABS
40.0000 mg | ORAL_TABLET | Freq: Every day | ORAL | 11 refills | Status: DC
  Filled 2024-08-30: qty 30, 30d supply, fill #0

## 2024-08-29 MED ORDER — LACTULOSE 10 GM/15ML PO SOLN: 20.0000 g | Freq: Two times a day (BID) | ORAL | 3 refills | Status: DC

## 2024-08-29 MED ORDER — EPLERENONE 50 MG PO TABS
100.0000 mg | ORAL_TABLET | Freq: Every day | ORAL | 11 refills | Status: DC
  Filled 2024-08-29: qty 60, 30d supply, fill #0

## 2024-08-29 MED ORDER — LACTULOSE 10 GM/15ML PO SOLN: 20.0000 g | Freq: Three times a day (TID) | ORAL | 3 refills | Status: AC

## 2024-08-29 MED ORDER — PANTOPRAZOLE SODIUM 40 MG PO TBEC
40.0000 mg | DELAYED_RELEASE_TABLET | Freq: Every day | ORAL | 3 refills | Status: DC
  Filled 2024-08-30: qty 90, 90d supply, fill #0

## 2024-08-30 ENCOUNTER — Other Ambulatory Visit: Payer: Self-pay

## 2024-08-30 ENCOUNTER — Encounter: Payer: Self-pay | Admitting: Internal Medicine

## 2024-08-30 ENCOUNTER — Ambulatory Visit (HOSPITAL_COMMUNITY)
Admission: RE | Admit: 2024-08-30 | Discharge: 2024-08-30 | Disposition: A | Discharge status: Home / Self Care | Source: Ambulatory Visit | Attending: Physician Assistant | Admitting: Physician Assistant

## 2024-08-30 ENCOUNTER — Other Ambulatory Visit (HOSPITAL_BASED_OUTPATIENT_CLINIC_OR_DEPARTMENT_OTHER): Payer: Self-pay

## 2024-08-30 ENCOUNTER — Ambulatory Visit: Admitting: Internal Medicine

## 2024-08-30 ENCOUNTER — Other Ambulatory Visit: Payer: Self-pay | Admitting: *Deleted

## 2024-08-30 VITALS — BP 134/83 | HR 78 | Temp 97.5°F | Ht 74.0 in | Wt 218.7 lb

## 2024-08-30 DIAGNOSIS — I4891 Unspecified atrial fibrillation: Secondary | ICD-10-CM

## 2024-08-30 DIAGNOSIS — K7682 Hepatic encephalopathy: Secondary | ICD-10-CM | POA: Diagnosis not present

## 2024-08-30 DIAGNOSIS — I2510 Atherosclerotic heart disease of native coronary artery without angina pectoris: Secondary | ICD-10-CM | POA: Diagnosis not present

## 2024-08-30 DIAGNOSIS — R7989 Other specified abnormal findings of blood chemistry: Secondary | ICD-10-CM

## 2024-08-30 DIAGNOSIS — Z9861 Coronary angioplasty status: Secondary | ICD-10-CM | POA: Diagnosis not present

## 2024-08-30 DIAGNOSIS — R0609 Other forms of dyspnea: Secondary | ICD-10-CM | POA: Insufficient documentation

## 2024-08-30 DIAGNOSIS — K7460 Unspecified cirrhosis of liver: Secondary | ICD-10-CM

## 2024-08-30 DIAGNOSIS — R188 Other ascites: Secondary | ICD-10-CM

## 2024-08-30 LAB — NM PET CT CARDIAC PERFUSION MULTI W/ABSOLUTE BLOODFLOW
LV dias vol: 95 mL (ref 62–150)
LV sys vol: 43 mL (ref 4.2–5.8)
MBFR: 1.7
Nuc Rest EF: 55 %
Nuc Stress EF: 60 %
Peak HR: 65 {beats}/min
Rest HR: 55 {beats}/min
Rest MBF: 0.89 ml/g/min
Rest Nuclear Isotope Dose: 25.3 mCi
SDS: 6
SRS: 0
SSS: 8
ST Depression (mm): 0 mm
Stress MBF: 1.51 ml/g/min
Stress Nuclear Isotope Dose: 25.3 mCi

## 2024-08-30 MED ORDER — EPLERENONE 50 MG PO TABS
50.0000 mg | ORAL_TABLET | Freq: Two times a day (BID) | ORAL | 11 refills | Status: AC
  Filled 2024-08-30: qty 60, 30d supply, fill #0
  Filled 2024-09-25: qty 60, 30d supply, fill #1
  Filled 2024-10-25: qty 60, 30d supply, fill #2

## 2024-08-30 MED ORDER — REGADENOSON 0.4 MG/5ML IV SOLN
INTRAVENOUS | Status: AC
  Filled 2024-08-30: qty 5

## 2024-08-30 MED ORDER — RUBIDIUM RB82 GENERATOR (RUBYFILL)
25.3300 | PACK | Freq: Once | INTRAVENOUS | Status: AC
  Administered 2024-08-30: 25.33 via INTRAVENOUS

## 2024-08-30 MED ORDER — FUROSEMIDE 40 MG PO TABS
40.0000 mg | ORAL_TABLET | Freq: Every day | ORAL | 11 refills | Status: DC
  Filled 2024-08-30: qty 30, 30d supply, fill #0
  Filled 2024-09-25: qty 30, 30d supply, fill #1

## 2024-08-30 MED ORDER — REGADENOSON 0.4 MG/5ML IV SOLN
0.4000 mg | Freq: Once | INTRAVENOUS | Status: AC
  Administered 2024-08-30: 0.4 mg via INTRAVENOUS

## 2024-08-30 MED ORDER — WARFARIN SODIUM 5 MG PO TABS
ORAL_TABLET | ORAL | 1 refills | Status: DC
  Filled 2024-08-30: qty 90, 90d supply, fill #0

## 2024-08-30 MED ORDER — RUBIDIUM RB82 GENERATOR (RUBYFILL)
25.3100 | PACK | Freq: Once | INTRAVENOUS | Status: AC
  Administered 2024-08-30: 25.31 via INTRAVENOUS

## 2024-08-30 NOTE — Progress Notes (Addendum)
 Pt tolerated lexiscan . No symptoms endorsed during scan. Given PO caffeine.

## 2024-08-30 NOTE — H&P (View-Only) (Signed)
 Referring Provider: Cook, Jayce G, DO Primary Care Physician:  Cook, Jayce G, DO Primary GI:  Dr. Cindie  Chief Complaint  Patient presents with   Follow-up    Patient here today for a follow up on Cirrhosis. Patient denies any current gi related issues, and would like to have an updated TCS as Dr. Harvey is the last Doctor who did the testing.     HPI:   Mitchell Oliver is a 78 y.o. male who presents to clinic today for follow-up visit.     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 Serological workup including acute hepatitis panel negative, iron  studies normal, ceruloplasmin negative, ANA positive though IgG normal, ASMA normal, AMA normal.  Liver biopsy 12/02/2023 with well-formed cirrhosis, portal histiocytes, etiology not apparent histologically.  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  Portal hypertension with ascites: Currently taking eplerenone 50 mg twice daily (spironolactone  caused breast tenderness), furosemide  40 mg daily.  Has required paracentesis x 2 though none since February 2025.  Denies any abdominal distention for me today.  Has also cut back on all of the salt with his diet.  Was previously using a lot of table salt which he has since stopped.  Variceal screening: EGD 01/21/2024 without evidence of esophageal varices.  Hepatic encephalopathy: Mild, reports fatigue, mental fog.  Started on lactulose  which he states is helping though not consistently taking this medication.  Having a bowel movement 1-2 times a day.  HCC screening: Ultrasound 04/20/2024 without hepatoma.  AFP 05/22/2024 mildly elevated at 11.9.  Does note progressively worsening chronic dyspnea.  Underwent PET stress testing today which was intermediate risk for ischemia.  IPMN:  Small, 4 mm, recommended surveillance imaging in 2 years.   Past Medical History:  Diagnosis Date   Allergy    Ascites    Cirrhosis (HCC)    COPD (chronic  obstructive pulmonary disease) (HCC)    CVA (cerebral vascular accident) (HCC) 10/26/2010   denies residual on 07/22/2015   ED (erectile dysfunction)    Family history of adverse reaction to anesthesia    think my mother had real bad headaches after anesthesia   GERD (gastroesophageal reflux disease)    Hypercholesterolemia    Hypertension    IFG (impaired fasting glucose)    NSTEMI (non-ST elevated myocardial infarction) (HCC)    Pneumonia ~ 2005    Past Surgical History:  Procedure Laterality Date   APPENDECTOMY  2009 [facility]   BACK SURGERY     BIOPSY  05/04/2014   Procedure: BIOPSY;  Surgeon: Margo LITTIE Harvey, MD;  [facility]: [facility];  Service: Endoscopy;;   CARDIAC CATHETERIZATION N/A 07/18/2015   Procedure: Left Heart Cath and Coronary Angiography;  Surgeon: Victory LELON Sharps, MD;  Location: [facility] INVASIVE CV LAB;  Service: Cardiovascular;  Laterality: N/A;   CARDIAC CATHETERIZATION N/A 07/18/2015   Procedure: Coronary Stent Intervention;  Surgeon: Victory LELON Sharps, MD;  Location: [facility] INVASIVE CV LAB;  Service: Cardiovascular;  Laterality: N/A;   CARDIAC CATHETERIZATION N/A 07/22/2015   Procedure: Coronary Stent Intervention;  Surgeon: Victory LELON Sharps, MD;  Location: [facility] INVASIVE CV LAB;  Service: Cardiovascular;  Laterality: N/A;   COLONOSCOPY  2009   COLONOSCOPY N/A 08/04/2019   Procedure: COLONOSCOPY;  Surgeon: Harvey Margo LITTIE, MD;  Location: [facility];  Service: Endoscopy;  Laterality: N/A;  12:30   ESOPHAGOGASTRODUODENOSCOPY N/A 05/04/2014   Procedure: ESOPHAGOGASTRODUODENOSCOPY (EGD);  Surgeon: Margo LITTIE Harvey, MD;  Location: [facility];  Service: Endoscopy;  Laterality: N/A;  9:15   ESOPHAGOGASTRODUODENOSCOPY N/A 01/21/2024   Procedure: EGD (ESOPHAGOGASTRODUODENOSCOPY);  Surgeon: Cindie Carlin POUR, DO;  Location: [facility];  Service: Endoscopy;  Laterality: N/A;  11:45AM, ASA 3   IR PARACENTESIS  12/02/2023   IR TRANSCATHETER BX  12/02/2023   POLYPECTOMY  08/04/2019   Procedure:  POLYPECTOMY;  Surgeon: Harvey Margo CROME, MD;  Location: [facility];  Service: Endoscopy;;  hepatic flexure, ascending colon, transverse colon, splenic flexure, descending colon    POSTERIOR LAMINECTOMY / DECOMPRESSION LUMBAR SPINE  06/03/2015   SAVORY DILATION N/A 05/04/2014   Procedure: SAVORY DILATION;  Surgeon: Margo CROME Harvey, MD;  Location: [facility];  Service: Endoscopy;  Laterality: N/A;    Current Outpatient Medications  Medication Sig Dispense Refill   albuterol  (VENTOLIN  HFA) 108 (90 Base) MCG/ACT inhaler INHALE 2 PUFFS INTO THE LUNGS EVERY 6 HOURS AS NEEDED FOR WHEEZING OR SHORTNESS OF BREATH 8.5 g 1   benazepril  (LOTENSIN ) 20 MG tablet TAKE 1 TABLET(20 MG) BY MOUTH DAILY 30 tablet 2   dorzolamide -timolol  (COSOPT ) 22.3-6.8 MG/ML ophthalmic solution Place 1 drop into both eyes 2 (two) times daily.     empagliflozin  (JARDIANCE ) 10 MG TABS tablet Take 1 tablet (10 mg total) by mouth daily. 90 tablet 1   eplerenone (INSPRA) 50 MG tablet Take 50 mg by mouth 2 (two) times daily.     furosemide  (LASIX ) 40 MG tablet Take 1 tablet (40 mg total) by mouth daily. 30 tablet 5   ipratropium (ATROVENT ) 0.06 % nasal spray Place 2 sprays into both nostrils 4 (four) times daily. 15 mL 0   Iron , Ferrous Sulfate , 325 (65 Fe) MG TABS Take 325 mg by mouth every Tuesday, Thursday, Saturday, and Sunday. 45 tablet 1   latanoprost  (XALATAN ) 0.005 % ophthalmic solution Place 1 drop into both eyes at bedtime.     ondansetron  (ZOFRAN ) 4 MG tablet Take 1 tablet (4 mg total) by mouth every 6 (six) hours as needed for nausea. 20 tablet 0   pantoprazole  (PROTONIX ) 40 MG tablet Take 1 tablet (40 mg total) by mouth daily. 90 tablet 3   lactulose  (CHRONULAC ) 10 GM/15ML solution Take 30 mLs (20 g total) by mouth 2 (two) times daily. (Patient not taking: Reported on 08/30/2024) 473 mL 3   lactulose  (CHRONULAC ) 10 GM/15ML solution Take 30 mLs (20 g total) by mouth 3 (three) times daily. (Patient not taking: Reported on  08/30/2024) 8100 mL 3   rifaximin  (XIFAXAN ) 550 MG TABS tablet Take 1 tablet (550 mg total) by mouth 2 (two) times daily. (Patient not taking: Reported on 08/30/2024) 60 tablet 11   spironolactone  (ALDACTONE ) 100 MG tablet Take 100 mg by mouth daily. (Patient not taking: Reported on 08/30/2024)     warfarin (COUMADIN ) 5 MG tablet TAKE 1 TABLET DAILY EXCEPT 1/2 TABLET ON TUESDAYS AND FRIDAYS OR AS DIRECTED BY COUMADIN  CLINIC 90 tablet 1   zinc  gluconate 50 MG tablet Take 50 mg by mouth daily.     No current facility-administered medications for this visit.    Allergies as of 08/30/2024 - Review Complete 08/30/2024  Allergen Reaction Noted   Bee venom Anaphylaxis 12/10/2023   Shellfish allergy Anaphylaxis 12/10/2023   Lidocaine  Hives and Rash 10/04/2023    Family History  Problem Relation Age of Onset   Hypertension Mother    Hypertension Father    Heart attack Father    Stroke Father    Colon  cancer Neg Hx    Colon polyps Neg Hx     Social History   Socioeconomic History   Marital status: Divorced    Spouse name: Not on file   Number of children: 3   Years of education: Not on file   Highest education level: Some college, no degree  Occupational History   Occupation: Airline Pilot  Tobacco Use   Smoking status: Former    Current packs/day: 0.00    Average packs/day: 2.0 packs/day for 24.2 years (48.3 ttl pk-yrs)    Types: Cigarettes    Start date: 70    Quit date: 12/24/1985    Years since quitting: 38.7   Smokeless tobacco: Never  Vaping Use   Vaping status: Never Used  Substance and Sexual Activity   Alcohol use: Yes    Alcohol/week: 6.0 standard drinks of alcohol    Types: 2 Glasses of wine, 4 Cans of beer per week   Drug use: No   Sexual activity: Yes  Other Topics Concern   Not on file  Social History Narrative   3 daughters, 2 live locally, one lives in [place].   3 grandsons   Active in grandson's travel ball. Works part time for [employer].     Social Drivers of Corporate Investment Banker Strain: Low Risk  (08/11/2024)   Overall Financial Resource Strain (CARDIA)    Difficulty of Paying Living Expenses: Not hard at all  Food Insecurity: No Food Insecurity (08/11/2024)   Hunger Vital Sign    Worried About Running Out of Food in the Last Year: Never true    Ran Out of Food in the Last Year: Never true  Transportation Needs: No Transportation Needs (08/11/2024)   PRAPARE - Administrator, Civil Service (Medical): No    Lack of Transportation (Non-Medical): No  Physical Activity: Inactive (08/11/2024)   Exercise Vital Sign    Days of Exercise per Week: 0 days    Minutes of Exercise per Session: 0 min  Stress: No Stress Concern Present (08/11/2024)   [employer] of Occupational Health - Occupational Stress Questionnaire    Feeling of Stress: Not at all  Social Connections: Moderately Integrated (08/11/2024)   Social Connection and Isolation Panel    Frequency of Communication with Friends and Family: More than three times a week    Frequency of Social Gatherings with Friends and Family: Twice a week    Attends Religious Services: More than 4 times per year    Active Member of [employer] or Organizations: Yes    Attends Engineer, Structural: More than 4 times per year    Marital Status: Divorced    Subjective: Review of Systems  Constitutional:  Negative for chills and fever.  HENT:  Negative for congestion and hearing loss.   Eyes:  Negative for blurred vision and double vision.  Respiratory:  Negative for cough and shortness of breath.   Cardiovascular:  Negative for chest pain and palpitations.  Gastrointestinal:  Negative for abdominal pain, blood in stool, constipation, diarrhea, heartburn, melena and vomiting.  Genitourinary:  Negative for dysuria and urgency.  Musculoskeletal:  Negative for joint pain and myalgias.  Skin:  Negative for itching and rash.  Neurological:  Negative for  dizziness and headaches.  Psychiatric/Behavioral:  Negative for depression. The patient is not nervous/anxious.      Objective: BP (!) 150/98 (BP Location: Left Arm, Patient Position: Sitting, Cuff Size: Normal)   Pulse 72  Temp (!) 97.5 F (36.4 C) (Temporal)   Ht 6' 2 (1.88 m)   Wt 218 lb 11.2 oz (99.2 kg)   BMI 28.08 kg/m  Physical Exam Constitutional:      Appearance: Normal appearance.  HENT:     Head: Normocephalic and atraumatic.  Eyes:     General: Scleral icterus present.     Extraocular Movements: Extraocular movements intact.     Conjunctiva/sclera: Conjunctivae normal.  Cardiovascular:     Rate and Rhythm: Normal rate and regular rhythm.  Pulmonary:     Effort: Pulmonary effort is normal.     Breath sounds: Normal breath sounds.  Abdominal:     General: Bowel sounds are normal. There is distension.     Palpations: Abdomen is soft.  Musculoskeletal:        General: Normal range of motion.     Cervical back: Normal range of motion and neck supple.  Skin:    General: Skin is warm.  Neurological:     General: No focal deficit present.     Mental Status: He is alert and oriented to person, place, and time.  Psychiatric:        Mood and Affect: Mood normal.        Behavior: Behavior normal.      Assessment/Plan:   1.  Cirrhosis-likely secondary to [facility], serologic workup as above.  Avoid all alcohol going forward.  CPT C, MELD unreliable given patient on Coumadin .  Portal hypertension with ascites: Continue eplerenone 50 mg twice daily, furosemide  40 mg daily.  Monitor daily weights and BP.  Creatinine stable.  Continue to monitor.  Nutrition recommendations:  High-protein diet from a primarily plant-based diet. Avoid red meat.  No raw or undercooked meat, seafood, or shellfish. Low-fat/cholesterol/carbohydrate diet. Limit sodium to no more than 2000 mg/day including everything that you eat and drink. Recommend at least 30 minutes of aerobic and  resistance exercise 3 days/week.  Hepatic encephalopathy: Improved with lactulose .  Counseled that the goal is to have at least 3 loose bowel movements a day.   Continue to follow-up with [facility] with [facility].   HCC screening: Due for repeat ultrasound 09/2024.  Blood work 08/28/2024 with worsening T. bili.  Recheck in 3 to 4 weeks.  In regards to his chronic worsening dyspnea, does not appear fluid overloaded to me on exam today.  Noted ischemia on PET stress test today.  I discussed with Dr. Bluford role of possible CT chest to further evaluate for pulmonary causes as well given possible fibrosis on PET stress test.  Recommended we hold off for now and await cardiology recommendations.   2.  IPMN-small, 4 mm, recommended surveillance imaging in 2 years.  3.  Chronic GERD-pantoprazole  stopped previously due to increased risk of SBP.  Patient states he was absolutely miserable during this time period, unable to sleep due to his reflux symptoms.  Restarted on his own.  Discussed increased risk in relation to PPI and he understands.  Follow-up in 2 to 3 months   08/30/2024 4:08 PM   Disclaimer: This note was dictated with voice recognition software. Similar sounding words can inadvertently be transcribed and may not be corrected upon review.

## 2024-08-30 NOTE — Telephone Encounter (Signed)
 Refill request for warfarin:  Last INR was 2.7 on 08/21/24 Next INR due 09/18/24 LOV was 08/28/24  Refill approved.

## 2024-08-30 NOTE — Patient Instructions (Signed)
 I am going to check blood work at [facility] in approximately 4 weeks.  Will call with results.  Continue follow-up with cardiology.  I notified Dr. Bluford in regards to your abnormal PET stress test today.  We will hold off on CT of your chest for now.  Follow-up in 2 to 3 months.

## 2024-08-30 NOTE — Progress Notes (Unsigned)
 Referring Provider: Cook, Jayce G, DO Primary Care Physician:  Cook, Jayce G, DO Primary GI:  Dr. Cindie  Chief Complaint  Patient presents with   Follow-up    Patient here today for a follow up on Cirrhosis. Patient denies any current gi related issues, and would like to have an updated TCS as Dr. Harvey is the last Doctor who did the testing.     HPI:   Mitchell Oliver is a 78 y.o. male who presents to clinic today for follow-up visit.     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 Serological workup including acute hepatitis panel negative, iron  studies normal, ceruloplasmin negative, ANA positive though IgG normal, ASMA normal, AMA normal.  Liver biopsy 12/02/2023 with well-formed cirrhosis, portal histiocytes, etiology not apparent histologically.  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  Portal hypertension with ascites: Currently taking eplerenone 50 mg twice daily (spironolactone  caused breast tenderness), furosemide  40 mg daily.  Has required paracentesis x 2 though none since February 2025.  Denies any abdominal distention for me today.  Has also cut back on all of the salt with his diet.  Was previously using a lot of table salt which he has since stopped.  Variceal screening: EGD 01/21/2024 without evidence of esophageal varices.  Hepatic encephalopathy: Mild, reports fatigue, mental fog.  Started on lactulose  which he states is helping though not consistently taking this medication.  Having a bowel movement 1-2 times a day.  HCC screening: Ultrasound 04/20/2024 without hepatoma.  AFP 05/22/2024 mildly elevated at 11.9.  Does note progressively worsening chronic dyspnea.  Underwent PET stress testing today which was intermediate risk for ischemia.  IPMN:  Small, 4 mm, recommended surveillance imaging in 2 years.   Past Medical History:  Diagnosis Date   Allergy    Ascites    Cirrhosis (HCC)    COPD (chronic  obstructive pulmonary disease) (HCC)    CVA (cerebral vascular accident) (HCC) 10/26/2010   denies residual on 07/22/2015   ED (erectile dysfunction)    Family history of adverse reaction to anesthesia    think my mother had real bad headaches after anesthesia   GERD (gastroesophageal reflux disease)    Hypercholesterolemia    Hypertension    IFG (impaired fasting glucose)    NSTEMI (non-ST elevated myocardial infarction) (HCC)    Pneumonia ~ 2005    Past Surgical History:  Procedure Laterality Date   APPENDECTOMY  2009 [facility]   BACK SURGERY     BIOPSY  05/04/2014   Procedure: BIOPSY;  Surgeon: Margo LITTIE Harvey, MD;  [facility];  Service: Endoscopy;;   CARDIAC CATHETERIZATION N/A 07/18/2015   Procedure: Left Heart Cath and Coronary Angiography;  Surgeon: Victory LELON Sharps, MD;  Location: [facility] INVASIVE CV LAB;  Service: Cardiovascular;  Laterality: N/A;   CARDIAC CATHETERIZATION N/A 07/18/2015   Procedure: Coronary Stent Intervention;  Surgeon: Victory LELON Sharps, MD;  Location: [facility] INVASIVE CV LAB;  Service: Cardiovascular;  Laterality: N/A;   CARDIAC CATHETERIZATION N/A 07/22/2015   Procedure: Coronary Stent Intervention;  Surgeon: Victory LELON Sharps, MD;  Location: [facility] INVASIVE CV LAB;  Service: Cardiovascular;  Laterality: N/A;   COLONOSCOPY  2009   COLONOSCOPY N/A 08/04/2019   Procedure: COLONOSCOPY;  Surgeon: Harvey Margo LITTIE, MD;  [facility];  Service: Endoscopy;  Laterality: N/A;  12:30   ESOPHAGOGASTRODUODENOSCOPY N/A 05/04/2014   Procedure: ESOPHAGOGASTRODUODENOSCOPY (EGD);  Surgeon: Margo LITTIE Harvey, MD;  [facility];  Service: Endoscopy;  Laterality: N/A;  9:15   ESOPHAGOGASTRODUODENOSCOPY N/A 01/21/2024   Procedure: EGD (ESOPHAGOGASTRODUODENOSCOPY);  Surgeon: Cindie Carlin POUR, DO;  [facility]: [facility];  Service: Endoscopy;  Laterality: N/A;  11:45AM, ASA 3   IR PARACENTESIS  12/02/2023   IR TRANSCATHETER BX  12/02/2023   POLYPECTOMY  08/04/2019   Procedure:  POLYPECTOMY;  Surgeon: Harvey Margo CROME, MD;  [facility]: [facility];  Service: Endoscopy;;  hepatic flexure, ascending colon, transverse colon, splenic flexure, descending colon    POSTERIOR LAMINECTOMY / DECOMPRESSION LUMBAR SPINE  06/03/2015   SAVORY DILATION N/A 05/04/2014   Procedure: SAVORY DILATION;  Surgeon: Margo CROME Harvey, MD;  [facility]: [facility];  Service: Endoscopy;  Laterality: N/A;    Current Outpatient Medications  Medication Sig Dispense Refill   albuterol  (VENTOLIN  HFA) 108 (90 Base) MCG/ACT inhaler INHALE 2 PUFFS INTO THE LUNGS EVERY 6 HOURS AS NEEDED FOR WHEEZING OR SHORTNESS OF BREATH 8.5 g 1   benazepril  (LOTENSIN ) 20 MG tablet TAKE 1 TABLET(20 MG) BY MOUTH DAILY 30 tablet 2   dorzolamide -timolol  (COSOPT ) 22.3-6.8 MG/ML ophthalmic solution Place 1 drop into both eyes 2 (two) times daily.     empagliflozin  (JARDIANCE ) 10 MG TABS tablet Take 1 tablet (10 mg total) by mouth daily. 90 tablet 1   eplerenone (INSPRA) 50 MG tablet Take 50 mg by mouth 2 (two) times daily.     furosemide  (LASIX ) 40 MG tablet Take 1 tablet (40 mg total) by mouth daily. 30 tablet 5   ipratropium (ATROVENT ) 0.06 % nasal spray Place 2 sprays into both nostrils 4 (four) times daily. 15 mL 0   Iron , Ferrous Sulfate , 325 (65 Fe) MG TABS Take 325 mg by mouth every Tuesday, Thursday, Saturday, and Sunday. 45 tablet 1   latanoprost  (XALATAN ) 0.005 % ophthalmic solution Place 1 drop into both eyes at bedtime.     ondansetron  (ZOFRAN ) 4 MG tablet Take 1 tablet (4 mg total) by mouth every 6 (six) hours as needed for nausea. 20 tablet 0   pantoprazole  (PROTONIX ) 40 MG tablet Take 1 tablet (40 mg total) by mouth daily. 90 tablet 3   lactulose  (CHRONULAC ) 10 GM/15ML solution Take 30 mLs (20 g total) by mouth 2 (two) times daily. (Patient not taking: Reported on 08/30/2024) 473 mL 3   lactulose  (CHRONULAC ) 10 GM/15ML solution Take 30 mLs (20 g total) by mouth 3 (three) times daily. (Patient not taking: Reported on  08/30/2024) 8100 mL 3   rifaximin  (XIFAXAN ) 550 MG TABS tablet Take 1 tablet (550 mg total) by mouth 2 (two) times daily. (Patient not taking: Reported on 08/30/2024) 60 tablet 11   spironolactone  (ALDACTONE ) 100 MG tablet Take 100 mg by mouth daily. (Patient not taking: Reported on 08/30/2024)     warfarin (COUMADIN ) 5 MG tablet TAKE 1 TABLET DAILY EXCEPT 1/2 TABLET ON TUESDAYS AND FRIDAYS OR AS DIRECTED BY COUMADIN  CLINIC 90 tablet 1   zinc  gluconate 50 MG tablet Take 50 mg by mouth daily.     No current facility-administered medications for this visit.    Allergies as of 08/30/2024 - Review Complete 08/30/2024  Allergen Reaction Noted   Bee venom Anaphylaxis 12/10/2023   Shellfish allergy Anaphylaxis 12/10/2023   Lidocaine  Hives and Rash 10/04/2023    Family History  Problem Relation Age of Onset   Hypertension Mother    Hypertension Father    Heart attack Father    Stroke Father    Colon  cancer Neg Hx    Colon polyps Neg Hx     Social History   Socioeconomic History   Marital status: Divorced    Spouse name: Not on file   Number of children: 3   Years of education: Not on file   Highest education level: Some college, no degree  Occupational History   Occupation: Airline Pilot  Tobacco Use   Smoking status: Former    Current packs/day: 0.00    Average packs/day: 2.0 packs/day for 24.2 years (48.3 ttl pk-yrs)    Types: Cigarettes    Start date: 70    Quit date: 12/24/1985    Years since quitting: 38.7   Smokeless tobacco: Never  Vaping Use   Vaping status: Never Used  Substance and Sexual Activity   Alcohol use: Yes    Alcohol/week: 6.0 standard drinks of alcohol    Types: 2 Glasses of wine, 4 Cans of beer per week   Drug use: No   Sexual activity: Yes  Other Topics Concern   Not on file  Social History Narrative   3 daughters, 2 live locally, one lives in [place].   3 grandsons   Active in grandson's travel ball. Works part time for [employer].     Social Drivers of Corporate Investment Banker Strain: Low Risk  (08/11/2024)   Overall Financial Resource Strain (CARDIA)    Difficulty of Paying Living Expenses: Not hard at all  Food Insecurity: No Food Insecurity (08/11/2024)   Hunger Vital Sign    Worried About Running Out of Food in the Last Year: Never true    Ran Out of Food in the Last Year: Never true  Transportation Needs: No Transportation Needs (08/11/2024)   PRAPARE - Administrator, Civil Service (Medical): No    Lack of Transportation (Non-Medical): No  Physical Activity: Inactive (08/11/2024)   Exercise Vital Sign    Days of Exercise per Week: 0 days    Minutes of Exercise per Session: 0 min  Stress: No Stress Concern Present (08/11/2024)   [employer] of Occupational Health - Occupational Stress Questionnaire    Feeling of Stress: Not at all  Social Connections: Moderately Integrated (08/11/2024)   Social Connection and Isolation Panel    Frequency of Communication with Friends and Family: More than three times a week    Frequency of Social Gatherings with Friends and Family: Twice a week    Attends Religious Services: More than 4 times per year    Active Member of [employer] or Organizations: Yes    Attends Engineer, Structural: More than 4 times per year    Marital Status: Divorced    Subjective: Review of Systems  Constitutional:  Negative for chills and fever.  HENT:  Negative for congestion and hearing loss.   Eyes:  Negative for blurred vision and double vision.  Respiratory:  Negative for cough and shortness of breath.   Cardiovascular:  Negative for chest pain and palpitations.  Gastrointestinal:  Negative for abdominal pain, blood in stool, constipation, diarrhea, heartburn, melena and vomiting.  Genitourinary:  Negative for dysuria and urgency.  Musculoskeletal:  Negative for joint pain and myalgias.  Skin:  Negative for itching and rash.  Neurological:  Negative for  dizziness and headaches.  Psychiatric/Behavioral:  Negative for depression. The patient is not nervous/anxious.      Objective: BP (!) 150/98 (BP Location: Left Arm, Patient Position: Sitting, Cuff Size: Normal)   Pulse 72  Temp (!) 97.5 F (36.4 C) (Temporal)   Ht 6' 2 (1.88 m)   Wt 218 lb 11.2 oz (99.2 kg)   BMI 28.08 kg/m  Physical Exam Constitutional:      Appearance: Normal appearance.  HENT:     Head: Normocephalic and atraumatic.  Eyes:     General: Scleral icterus present.     Extraocular Movements: Extraocular movements intact.     Conjunctiva/sclera: Conjunctivae normal.  Cardiovascular:     Rate and Rhythm: Normal rate and regular rhythm.  Pulmonary:     Effort: Pulmonary effort is normal.     Breath sounds: Normal breath sounds.  Abdominal:     General: Bowel sounds are normal. There is distension.     Palpations: Abdomen is soft.  Musculoskeletal:        General: Normal range of motion.     Cervical back: Normal range of motion and neck supple.  Skin:    General: Skin is warm.  Neurological:     General: No focal deficit present.     Mental Status: He is alert and oriented to person, place, and time.  Psychiatric:        Mood and Affect: Mood normal.        Behavior: Behavior normal.      Assessment/Plan:   1.  Cirrhosis-likely secondary to [facility], serologic workup as above.  Avoid all alcohol going forward.  CPT C, MELD unreliable given patient on Coumadin .  Portal hypertension with ascites: Continue eplerenone 50 mg twice daily, furosemide  40 mg daily.  Monitor daily weights and BP.  Creatinine stable.  Continue to monitor.  Nutrition recommendations:  High-protein diet from a primarily plant-based diet. Avoid red meat.  No raw or undercooked meat, seafood, or shellfish. Low-fat/cholesterol/carbohydrate diet. Limit sodium to no more than 2000 mg/day including everything that you eat and drink. Recommend at least 30 minutes of aerobic and  resistance exercise 3 days/week.  Hepatic encephalopathy: Improved with lactulose .  Counseled that the goal is to have at least 3 loose bowel movements a day.   Continue to follow-up with [facility] with [facility] hepatology.   HCC screening: Due for repeat ultrasound 09/2024.  Blood work 08/28/2024 with worsening T. bili.  Recheck in 3 to 4 weeks.  In regards to his chronic worsening dyspnea, does not appear fluid overloaded to me on exam today.  Noted ischemia on PET stress test today.  I discussed with Dr. Bluford role of possible CT chest to further evaluate for pulmonary causes as well given possible fibrosis on PET stress test.  Recommended we hold off for now and await cardiology recommendations.   2.  IPMN-small, 4 mm, recommended surveillance imaging in 2 years.  3.  Chronic GERD-pantoprazole  stopped previously due to increased risk of SBP.  Patient states he was absolutely miserable during this time period, unable to sleep due to his reflux symptoms.  Restarted on his own.  Discussed increased risk in relation to PPI and he understands.  Follow-up in 2 to 3 months   08/30/2024 4:08 PM   Disclaimer: This note was dictated with voice recognition software. Similar sounding words can inadvertently be transcribed and may not be corrected upon review.

## 2024-08-31 ENCOUNTER — Encounter: Admitting: Internal Medicine

## 2024-08-31 ENCOUNTER — Other Ambulatory Visit (HOSPITAL_BASED_OUTPATIENT_CLINIC_OR_DEPARTMENT_OTHER): Payer: Self-pay

## 2024-09-01 ENCOUNTER — Ambulatory Visit: Payer: Self-pay | Admitting: Physician Assistant

## 2024-09-01 ENCOUNTER — Other Ambulatory Visit (HOSPITAL_BASED_OUTPATIENT_CLINIC_OR_DEPARTMENT_OTHER): Payer: Self-pay

## 2024-09-01 ENCOUNTER — Encounter: Payer: Self-pay | Admitting: Cardiovascular Disease

## 2024-09-01 DIAGNOSIS — I2510 Atherosclerotic heart disease of native coronary artery without angina pectoris: Secondary | ICD-10-CM

## 2024-09-01 DIAGNOSIS — I471 Supraventricular tachycardia, unspecified: Secondary | ICD-10-CM

## 2024-09-01 DIAGNOSIS — R001 Bradycardia, unspecified: Secondary | ICD-10-CM

## 2024-09-01 DIAGNOSIS — I4891 Unspecified atrial fibrillation: Secondary | ICD-10-CM

## 2024-09-01 DIAGNOSIS — Z01812 Encounter for preprocedural laboratory examination: Secondary | ICD-10-CM

## 2024-09-01 NOTE — Telephone Encounter (Signed)
 Followed up with dtr about scheduling cath. Cath scheduled for 11/13 w/ Patwardhan.  Aware will send instructions via mychart.  Advised to hold Jardiance  for 3 days and Coumadin  for 4 days.  She is going to have pt hold both medications for 4 days to make it less confusing for pt. Forwarding to provider to address if correct instruction to hold Coumadin  x4 days. Please let dtr know if other instructions. Advised to have pt stop by [facility] on Monday for INR.  She is asking if can add A1C -- aware will forward to provider to advise/add to order. Reviewed instructions verbally and aware sending via mychart.  Advised to review and call office with any questions/concerns.

## 2024-09-01 NOTE — Telephone Encounter (Signed)
 Pt's daughter returned call please advise

## 2024-09-01 NOTE — Telephone Encounter (Signed)
 Pt's daughter called to FU please advise

## 2024-09-01 NOTE — Telephone Encounter (Signed)
 Pt is returning call and asking you call his daughter

## 2024-09-02 NOTE — Telephone Encounter (Signed)
 Spoke to daughter, Nat Saba (DPR on file) today. All questions answered. Glendia Ferrier, PA-C    09/02/2024 9:52 AM

## 2024-09-04 ENCOUNTER — Other Ambulatory Visit (HOSPITAL_BASED_OUTPATIENT_CLINIC_OR_DEPARTMENT_OTHER): Payer: Self-pay

## 2024-09-04 ENCOUNTER — Other Ambulatory Visit: Payer: Self-pay | Admitting: Family Medicine

## 2024-09-04 MED ORDER — EMPAGLIFLOZIN 10 MG PO TABS
10.0000 mg | ORAL_TABLET | Freq: Every day | ORAL | 3 refills | Status: DC
  Filled 2024-09-04: qty 90, 90d supply, fill #0
  Filled 2024-09-25: qty 90, 90d supply, fill #1

## 2024-09-04 MED ORDER — BENAZEPRIL HCL 20 MG PO TABS
20.0000 mg | ORAL_TABLET | Freq: Every day | ORAL | 3 refills | Status: AC
  Filled 2024-09-04: qty 90, 90d supply, fill #0
  Filled 2024-09-25: qty 90, 90d supply, fill #1

## 2024-09-04 NOTE — Addendum Note (Signed)
 Addended byBETHA FERRIER, GLENDIA T on: 09/04/2024 09:37 AM   Modules accepted: Orders

## 2024-09-05 ENCOUNTER — Ambulatory Visit: Payer: Self-pay | Admitting: Physician Assistant

## 2024-09-05 ENCOUNTER — Other Ambulatory Visit (HOSPITAL_BASED_OUTPATIENT_CLINIC_OR_DEPARTMENT_OTHER): Payer: Self-pay

## 2024-09-05 LAB — PROTIME-INR
INR: 1.7 — ABNORMAL HIGH (ref 0.9–1.2)
Prothrombin Time: 17.4 s — ABNORMAL HIGH (ref 9.1–12.0)

## 2024-09-05 NOTE — Telephone Encounter (Signed)
 I spoke with patient's daughter, Nat, answered questions about Cardiac Cath instructions.

## 2024-09-05 NOTE — Progress Notes (Signed)
 Has INR appt 09/18/24.  Will follow results of heart cath and move up INR appt 1 wk if needed.

## 2024-09-07 ENCOUNTER — Ambulatory Visit (HOSPITAL_COMMUNITY)
Admission: RE | Admit: 2024-09-07 | Discharge: 2024-09-07 | Disposition: A | Discharge status: Home / Self Care | Attending: Cardiology | Admitting: Cardiology

## 2024-09-07 ENCOUNTER — Other Ambulatory Visit: Payer: Self-pay

## 2024-09-07 ENCOUNTER — Encounter (HOSPITAL_COMMUNITY)
Admission: RE | Disposition: A | Discharge status: Home / Self Care | Payer: Self-pay | Source: Home / Self Care | Attending: Cardiology

## 2024-09-07 DIAGNOSIS — Z7984 Long term (current) use of oral hypoglycemic drugs: Secondary | ICD-10-CM | POA: Diagnosis not present

## 2024-09-07 DIAGNOSIS — Z79899 Other long term (current) drug therapy: Secondary | ICD-10-CM | POA: Diagnosis not present

## 2024-09-07 DIAGNOSIS — J449 Chronic obstructive pulmonary disease, unspecified: Secondary | ICD-10-CM | POA: Diagnosis not present

## 2024-09-07 DIAGNOSIS — G4733 Obstructive sleep apnea (adult) (pediatric): Secondary | ICD-10-CM | POA: Insufficient documentation

## 2024-09-07 DIAGNOSIS — Z8673 Personal history of transient ischemic attack (TIA), and cerebral infarction without residual deficits: Secondary | ICD-10-CM | POA: Insufficient documentation

## 2024-09-07 DIAGNOSIS — R9439 Abnormal result of other cardiovascular function study: Secondary | ICD-10-CM

## 2024-09-07 DIAGNOSIS — Z7901 Long term (current) use of anticoagulants: Secondary | ICD-10-CM | POA: Diagnosis not present

## 2024-09-07 DIAGNOSIS — E119 Type 2 diabetes mellitus without complications: Secondary | ICD-10-CM | POA: Insufficient documentation

## 2024-09-07 DIAGNOSIS — T82855A Stenosis of coronary artery stent, initial encounter: Secondary | ICD-10-CM | POA: Insufficient documentation

## 2024-09-07 DIAGNOSIS — K7460 Unspecified cirrhosis of liver: Secondary | ICD-10-CM | POA: Insufficient documentation

## 2024-09-07 DIAGNOSIS — I110 Hypertensive heart disease with heart failure: Secondary | ICD-10-CM | POA: Diagnosis not present

## 2024-09-07 DIAGNOSIS — I272 Pulmonary hypertension, unspecified: Secondary | ICD-10-CM | POA: Insufficient documentation

## 2024-09-07 DIAGNOSIS — I25118 Atherosclerotic heart disease of native coronary artery with other forms of angina pectoris: Secondary | ICD-10-CM | POA: Insufficient documentation

## 2024-09-07 DIAGNOSIS — I4891 Unspecified atrial fibrillation: Secondary | ICD-10-CM

## 2024-09-07 DIAGNOSIS — R0609 Other forms of dyspnea: Secondary | ICD-10-CM

## 2024-09-07 DIAGNOSIS — E7800 Pure hypercholesterolemia, unspecified: Secondary | ICD-10-CM | POA: Diagnosis not present

## 2024-09-07 DIAGNOSIS — I252 Old myocardial infarction: Secondary | ICD-10-CM | POA: Insufficient documentation

## 2024-09-07 DIAGNOSIS — Y831 Surgical operation with implant of artificial internal device as the cause of abnormal reaction of the patient, or of later complication, without mention of misadventure at the time of the procedure: Secondary | ICD-10-CM | POA: Diagnosis not present

## 2024-09-07 DIAGNOSIS — I2584 Coronary atherosclerosis due to calcified coronary lesion: Secondary | ICD-10-CM | POA: Diagnosis not present

## 2024-09-07 DIAGNOSIS — I5032 Chronic diastolic (congestive) heart failure: Secondary | ICD-10-CM | POA: Diagnosis not present

## 2024-09-07 DIAGNOSIS — I2510 Atherosclerotic heart disease of native coronary artery without angina pectoris: Secondary | ICD-10-CM

## 2024-09-07 DIAGNOSIS — I480 Paroxysmal atrial fibrillation: Secondary | ICD-10-CM | POA: Insufficient documentation

## 2024-09-07 HISTORY — PX: CORONARY PRESSURE/FFR WITH 3D MAPPING: CATH118309

## 2024-09-07 HISTORY — PX: RIGHT/LEFT HEART CATH AND CORONARY ANGIOGRAPHY: CATH118266

## 2024-09-07 LAB — POCT I-STAT 7, (LYTES, BLD GAS, ICA,H+H)
Acid-base deficit: 5 mmol/L — ABNORMAL HIGH (ref 0.0–2.0)
Bicarbonate: 17.8 mmol/L — ABNORMAL LOW (ref 20.0–28.0)
Calcium, Ion: 1.19 mmol/L (ref 1.15–1.40)
HCT: 44 % (ref 39.0–52.0)
Hemoglobin: 15 g/dL (ref 13.0–17.0)
O2 Saturation: 94 %
Patient temperature: 12
Potassium: 3.5 mmol/L (ref 3.5–5.1)
Sodium: 140 mmol/L (ref 135–145)
TCO2: 19 mmol/L — ABNORMAL LOW (ref 22–32)
pCO2 arterial: <15 mmHg — CL (ref 32–48)
pH, Arterial: >7.8 (ref 7.35–7.45)
pO2, Arterial: <15 mmHg — CL (ref 83–108)

## 2024-09-07 LAB — POCT I-STAT EG7
Acid-base deficit: 4 mmol/L — ABNORMAL HIGH (ref 0.0–2.0)
Acid-base deficit: 4 mmol/L — ABNORMAL HIGH (ref 0.0–2.0)
Acid-base deficit: 5 mmol/L — ABNORMAL HIGH (ref 0.0–2.0)
Bicarbonate: 19.4 mmol/L — ABNORMAL LOW (ref 20.0–28.0)
Bicarbonate: 20.2 mmol/L (ref 20.0–28.0)
Bicarbonate: 20.3 mmol/L (ref 20.0–28.0)
Calcium, Ion: 1.11 mmol/L — ABNORMAL LOW (ref 1.15–1.40)
Calcium, Ion: 1.24 mmol/L (ref 1.15–1.40)
Calcium, Ion: 1.27 mmol/L (ref 1.15–1.40)
HCT: 43 % (ref 39.0–52.0)
HCT: 44 % (ref 39.0–52.0)
HCT: 45 % (ref 39.0–52.0)
Hemoglobin: 14.6 g/dL (ref 13.0–17.0)
Hemoglobin: 15 g/dL (ref 13.0–17.0)
Hemoglobin: 15.3 g/dL (ref 13.0–17.0)
O2 Saturation: 70 %
O2 Saturation: 72 %
O2 Saturation: 75 %
Potassium: 3.4 mmol/L — ABNORMAL LOW (ref 3.5–5.1)
Potassium: 3.6 mmol/L (ref 3.5–5.1)
Potassium: 3.6 mmol/L (ref 3.5–5.1)
Sodium: 137 mmol/L (ref 135–145)
Sodium: 140 mmol/L (ref 135–145)
Sodium: 140 mmol/L (ref 135–145)
TCO2: 20 mmol/L — ABNORMAL LOW (ref 22–32)
TCO2: 21 mmol/L — ABNORMAL LOW (ref 22–32)
TCO2: 21 mmol/L — ABNORMAL LOW (ref 22–32)
pCO2, Ven: 33.4 mmHg — ABNORMAL LOW (ref 44–60)
pCO2, Ven: 33.5 mmHg — ABNORMAL LOW (ref 44–60)
pCO2, Ven: 33.8 mmHg — ABNORMAL LOW (ref 44–60)
pH, Ven: 7.372 (ref 7.25–7.43)
pH, Ven: 7.386 (ref 7.25–7.43)
pH, Ven: 7.389 (ref 7.25–7.43)
pO2, Ven: 37 mmHg (ref 32–45)
pO2, Ven: 38 mmHg (ref 32–45)
pO2, Ven: 40 mmHg (ref 32–45)

## 2024-09-07 LAB — PROTIME-INR
INR: 1.6 — ABNORMAL HIGH (ref 0.8–1.2)
Prothrombin Time: 19.4 s — ABNORMAL HIGH (ref 11.4–15.2)

## 2024-09-07 SURGERY — RIGHT/LEFT HEART CATH AND CORONARY ANGIOGRAPHY
Anesthesia: LOCAL

## 2024-09-07 MED ORDER — WARFARIN SODIUM 5 MG PO TABS: ORAL_TABLET | ORAL | Status: DC

## 2024-09-07 MED ORDER — HEPARIN SODIUM (PORCINE) 1000 UNIT/ML IJ SOLN
INTRAMUSCULAR | Status: AC
  Filled 2024-09-07: qty 10

## 2024-09-07 MED ORDER — LIDOCAINE HCL (PF) 1 % IJ SOLN
INTRAMUSCULAR | Status: DC | PRN
  Administered 2024-09-07: 5 mL

## 2024-09-07 MED ORDER — FREE WATER: 500.0000 mL | Freq: Once | Status: DC

## 2024-09-07 MED ORDER — HEPARIN (PORCINE) IN NACL 2000-0.9 UNIT/L-% IV SOLN
INTRAVENOUS | Status: DC | PRN
  Administered 2024-09-07: 1000 mL

## 2024-09-07 MED ORDER — VERAPAMIL HCL 2.5 MG/ML IV SOLN
INTRAVENOUS | Status: DC | PRN
  Administered 2024-09-07: 10 mL via INTRA_ARTERIAL

## 2024-09-07 MED ORDER — SODIUM CHLORIDE 0.9% FLUSH: 3.0000 mL | Freq: Two times a day (BID) | INTRAVENOUS | Status: DC

## 2024-09-07 MED ORDER — SODIUM CHLORIDE 0.9 % IV SOLN: 250.0000 mL | INTRAVENOUS | Status: DC | PRN

## 2024-09-07 MED ORDER — ASPIRIN 81 MG PO CHEW: 81.0000 mg | CHEWABLE_TABLET | ORAL | Status: DC

## 2024-09-07 MED ORDER — LIDOCAINE HCL (PF) 1 % IJ SOLN
INTRAMUSCULAR | Status: AC
  Filled 2024-09-07: qty 30

## 2024-09-07 MED ORDER — FENTANYL CITRATE (PF) 100 MCG/2ML IJ SOLN
INTRAMUSCULAR | Status: DC | PRN
  Administered 2024-09-07: 25 ug via INTRAVENOUS

## 2024-09-07 MED ORDER — MIDAZOLAM HCL (PF) 2 MG/2ML IJ SOLN
INTRAMUSCULAR | Status: DC | PRN
  Administered 2024-09-07: 1 mg via INTRAVENOUS

## 2024-09-07 MED ORDER — MIDAZOLAM HCL 2 MG/2ML IJ SOLN
INTRAMUSCULAR | Status: AC
  Filled 2024-09-07: qty 2

## 2024-09-07 MED ORDER — VERAPAMIL HCL 2.5 MG/ML IV SOLN
INTRAVENOUS | Status: AC
  Filled 2024-09-07: qty 2

## 2024-09-07 MED ORDER — FENTANYL CITRATE (PF) 100 MCG/2ML IJ SOLN
INTRAMUSCULAR | Status: AC
  Filled 2024-09-07: qty 2

## 2024-09-07 MED ORDER — SODIUM CHLORIDE 0.9% FLUSH: 3.0000 mL | INTRAVENOUS | Status: DC | PRN

## 2024-09-07 MED ORDER — HEPARIN SODIUM (PORCINE) 1000 UNIT/ML IJ SOLN
INTRAMUSCULAR | Status: DC | PRN
  Administered 2024-09-07: 5000 [IU] via INTRAVENOUS

## 2024-09-07 SURGICAL SUPPLY — 10 items
CARD KEY FFR CATHWORX (MISCELLANEOUS) IMPLANT
CATH 5FR JL3.5 JR4 ANG PIG MP (CATHETERS) IMPLANT
CATH BALLN WEDGE 5F 110CM (CATHETERS) IMPLANT
CATH INFINITI 5FR JL4 (CATHETERS) IMPLANT
DEVICE RAD COMP TR BAND LRG (VASCULAR PRODUCTS) IMPLANT
GLIDESHEATH SLEND A-KIT 6F 22G (SHEATH) IMPLANT
GUIDEWIRE INQWIRE 1.5J.035X260 (WIRE) IMPLANT
PACK CARDIAC CATHETERIZATION (CUSTOM PROCEDURE TRAY) ×1 IMPLANT
SET ATX-X65L (MISCELLANEOUS) IMPLANT
SHEATH GLIDE SLENDER 4/5FR (SHEATH) IMPLANT

## 2024-09-07 NOTE — Interval H&P Note (Signed)
 History and Physical Interval Note:  09/07/2024 10:18 AM  Mitchell Oliver  has presented today for surgery, with the diagnosis of shortness of breath - angina.  The various methods of treatment have been discussed with the patient and family. After consideration of risks, benefits and other options for treatment, the patient has consented to  Procedure(s): RIGHT/LEFT HEART CATH AND CORONARY ANGIOGRAPHY (N/A) as a surgical intervention.  The patient's history has been reviewed, patient examined, no change in status, stable for surgery.  I have reviewed the patient's chart and labs.  Questions were answered to the patient's satisfaction.     [name]

## 2024-09-07 NOTE — Progress Notes (Signed)
 Discharge instructions reviewed with patient, Daughter Nat and significant other Niels. Denies questions or Conners. T R Band removed no s/s of complications at the incision site. PT ambulated to the bathroom was able to void without difficulty. Tolerated PO intake. PT escorted from the unit via wheel chair to personal vehicle.

## 2024-09-07 NOTE — Discharge Instructions (Signed)

## 2024-09-07 NOTE — Interval H&P Note (Signed)
 History and Physical Interval Note:  09/07/2024 10:17 AM  Mitchell Oliver  has presented today for surgery, with the diagnosis of shortness of breath - angina.  The various methods of treatment have been discussed with the patient and family. After consideration of risks, benefits and other options for treatment, the patient has consented to  Procedure(s): RIGHT/LEFT HEART CATH AND CORONARY ANGIOGRAPHY (N/A) as a surgical intervention.  The patient's history has been reviewed, patient examined, no change in status, stable for surgery.  I have reviewed the patient's chart and labs.  Questions were answered to the patient's satisfaction.     Mitchell Oliver J Deiontae Rabel

## 2024-09-08 ENCOUNTER — Encounter (HOSPITAL_COMMUNITY): Payer: Self-pay | Admitting: Cardiology

## 2024-09-11 ENCOUNTER — Other Ambulatory Visit: Payer: Self-pay | Admitting: Physician Assistant

## 2024-09-11 DIAGNOSIS — R0609 Other forms of dyspnea: Secondary | ICD-10-CM

## 2024-09-11 DIAGNOSIS — I2729 Other secondary pulmonary hypertension: Secondary | ICD-10-CM

## 2024-09-15 ENCOUNTER — Telehealth: Payer: Self-pay | Admitting: *Deleted

## 2024-09-15 NOTE — Telephone Encounter (Signed)
 Received monitor report from [employer] demonstrating HR of 160 bmp SVT with PVCs.  Spoke with daughter who reports she doesn't know how pt was feeling as she was not with him and he won't tell her when he feels anything different.  She does note pt was previously taking Carvedilol  however it was discontinued d/t bradycardia (HR in 40s)  will review with DOD.

## 2024-09-15 NOTE — Telephone Encounter (Signed)
 Reviewed monitor strips with Dr Lavona who advised pt needs to be seen in office prior to any medication changes d/t current monitor results and HX of bradycardia on Carvedilol .  Appt scheduled with Dr Delford - DOD on Monday at 3 pm.  Daughter is aware.

## 2024-09-18 ENCOUNTER — Ambulatory Visit: Admitting: Cardiovascular Disease

## 2024-09-18 ENCOUNTER — Other Ambulatory Visit (HOSPITAL_BASED_OUTPATIENT_CLINIC_OR_DEPARTMENT_OTHER): Payer: Self-pay

## 2024-09-18 ENCOUNTER — Ambulatory Visit: Attending: Internal Medicine | Admitting: *Deleted

## 2024-09-18 DIAGNOSIS — Z5181 Encounter for therapeutic drug level monitoring: Secondary | ICD-10-CM | POA: Diagnosis not present

## 2024-09-18 DIAGNOSIS — Z8673 Personal history of transient ischemic attack (TIA), and cerebral infarction without residual deficits: Secondary | ICD-10-CM | POA: Diagnosis not present

## 2024-09-18 DIAGNOSIS — I4891 Unspecified atrial fibrillation: Secondary | ICD-10-CM | POA: Diagnosis not present

## 2024-09-18 LAB — POCT INR: INR: 2.1 (ref 2.0–3.0)

## 2024-09-18 MED ORDER — AMIODARONE HCL 200 MG PO TABS
200.0000 mg | ORAL_TABLET | Freq: Every day | ORAL | 3 refills | Status: DC
  Filled 2024-09-18: qty 90, 90d supply, fill #0

## 2024-09-18 NOTE — Telephone Encounter (Signed)
 I'm not in the office today. I do not see monitor strips in the chart. Pt wants to be seen in [place] not [place]. He has requested Dr. Verlin. He is pending eval with pulmonology for dyspnea, COPD. Amio is probably not the best choice. Can you show monitor strips to Dr. Verlin (he is in office today) to help with recommendations? Glendia Ferrier, PA-C    09/18/2024 12:42 PM

## 2024-09-18 NOTE — Telephone Encounter (Signed)
 Spoke with daughter and advised of Dr Claiborne order to be started on Amiodarone  200 mg a day and f/u up in the [place] office with either Dr Alvan or Dr Debera.  Per daughter, pt will not be seen again in the [place] office d/t the poor experience he had there.  She expressed frustration of appt being cancelled since she and her husband had to rearrange their work scheduled etc in order to get pt here.  Daughter requests Glendia Ferrier, [place] review this new medication order to verify this is the best treatment plan for the pt.  Advised I will forward to him.  RX sent into pharmacy of choice.

## 2024-09-18 NOTE — Progress Notes (Signed)
 INR 2.1; Please see anticoagulation encounter

## 2024-09-18 NOTE — Patient Instructions (Signed)
Continue warfarin 1 tablet daily except 1/2 tablet on Tuesdays and Fridays Recheck in 4 wks 

## 2024-09-19 NOTE — Addendum Note (Signed)
 Addended byBETHA FERRIER, GLENDIA T on: 09/19/2024 04:51 PM   Modules accepted: Orders

## 2024-09-19 NOTE — Telephone Encounter (Signed)
 I reviewed his history and monitor with Dr. Almetta today. I am referring him to EP to see Dr. Almetta after the monitor is complete (in the next 3-4 weeks). He sees Dr. Verlin in Feb. So he should be good as far as follow up goes. Glendia Ferrier, PA-C    09/19/2024 5:00 PM

## 2024-09-20 ENCOUNTER — Other Ambulatory Visit (HOSPITAL_BASED_OUTPATIENT_CLINIC_OR_DEPARTMENT_OTHER): Payer: Self-pay

## 2024-09-20 ENCOUNTER — Other Ambulatory Visit: Payer: Self-pay | Admitting: Physician Assistant

## 2024-09-25 ENCOUNTER — Other Ambulatory Visit: Payer: Self-pay | Admitting: Pulmonary Disease

## 2024-09-25 ENCOUNTER — Ambulatory Visit: Admitting: Pulmonary Disease

## 2024-09-25 ENCOUNTER — Encounter: Payer: Self-pay | Admitting: Pulmonary Disease

## 2024-09-25 ENCOUNTER — Other Ambulatory Visit (HOSPITAL_COMMUNITY): Payer: Self-pay

## 2024-09-25 ENCOUNTER — Other Ambulatory Visit (HOSPITAL_BASED_OUTPATIENT_CLINIC_OR_DEPARTMENT_OTHER): Payer: Self-pay

## 2024-09-25 VITALS — BP 120/70 | HR 54 | Temp 97.2°F | Ht 74.0 in | Wt 222.6 lb

## 2024-09-25 DIAGNOSIS — R0609 Other forms of dyspnea: Secondary | ICD-10-CM

## 2024-09-25 DIAGNOSIS — I272 Pulmonary hypertension, unspecified: Secondary | ICD-10-CM | POA: Diagnosis not present

## 2024-09-25 MED ORDER — STIOLTO RESPIMAT 2.5-2.5 MCG/ACT IN AERS
2.0000 | INHALATION_SPRAY | Freq: Every day | RESPIRATORY_TRACT | 2 refills | Status: AC
  Filled 2024-09-25: qty 4, 30d supply, fill #0
  Filled 2024-10-25: qty 4, 30d supply, fill #1

## 2024-09-25 MED ORDER — ALBUTEROL SULFATE HFA 108 (90 BASE) MCG/ACT IN AERS
2.0000 | INHALATION_SPRAY | Freq: Four times a day (QID) | RESPIRATORY_TRACT | 1 refills | Status: AC | PRN
  Filled 2024-09-25: qty 6.7, 17d supply, fill #0

## 2024-09-25 NOTE — Patient Instructions (Signed)
 It is nice to meet you  To further evaluate your symptoms I recommend pulmonary function test, we will try to get these scheduled as soon as possible Mitchell Oliver.  If it, be more of a delay there compared to [facility], we will get them scheduled here in [place]  I also ordered a high-resolution CT scan of the chest to further evaluate if there are other causes in the lungs that could be causing your shortness of breath  Use Stiolto 2 puffs once a day every day.  You can go to YouTube and [facility] how to use the Respimat device  which is a good refresher on correct use.  If this is too expensive let me know I will look for a cost-effective alternative.  Return to clinic in 2 months or sooner as needed with Dr. Annella

## 2024-09-26 ENCOUNTER — Other Ambulatory Visit (HOSPITAL_BASED_OUTPATIENT_CLINIC_OR_DEPARTMENT_OTHER): Payer: Self-pay

## 2024-09-26 ENCOUNTER — Other Ambulatory Visit: Payer: Self-pay

## 2024-09-28 ENCOUNTER — Ambulatory Visit: Admitting: Internal Medicine

## 2024-09-29 LAB — COMPREHENSIVE METABOLIC PANEL WITH GFR
ALT: 33 IU/L (ref 0–44)
AST: 46 IU/L — ABNORMAL HIGH (ref 0–40)
Albumin: 3.1 g/dL — ABNORMAL LOW (ref 3.8–4.8)
Alkaline Phosphatase: 116 IU/L (ref 47–123)
BUN/Creatinine Ratio: 8 — ABNORMAL LOW (ref 10–24)
BUN: 10 mg/dL (ref 8–27)
Bilirubin Total: 4.5 mg/dL — ABNORMAL HIGH (ref 0.0–1.2)
CO2: 20 mmol/L (ref 20–29)
Calcium: 9.3 mg/dL (ref 8.6–10.2)
Chloride: 102 mmol/L (ref 96–106)
Creatinine, Ser: 1.29 mg/dL — ABNORMAL HIGH (ref 0.76–1.27)
Globulin, Total: 3.7 g/dL (ref 1.5–4.5)
Glucose: 107 mg/dL — ABNORMAL HIGH (ref 70–99)
Potassium: 4.6 mmol/L (ref 3.5–5.2)
Sodium: 139 mmol/L (ref 134–144)
Total Protein: 6.8 g/dL (ref 6.0–8.5)
eGFR: 56 mL/min/1.73 — ABNORMAL LOW (ref >59)

## 2024-10-04 ENCOUNTER — Ambulatory Visit: Admitting: Pulmonary Disease

## 2024-10-09 ENCOUNTER — Other Ambulatory Visit: Payer: Self-pay | Admitting: Internal Medicine

## 2024-10-11 ENCOUNTER — Other Ambulatory Visit (HOSPITAL_BASED_OUTPATIENT_CLINIC_OR_DEPARTMENT_OTHER): Payer: Self-pay

## 2024-10-11 ENCOUNTER — Other Ambulatory Visit: Payer: Self-pay

## 2024-10-11 ENCOUNTER — Other Ambulatory Visit: Payer: Self-pay | Admitting: Family Medicine

## 2024-10-11 DIAGNOSIS — K219 Gastro-esophageal reflux disease without esophagitis: Secondary | ICD-10-CM

## 2024-10-11 MED ORDER — PANTOPRAZOLE SODIUM 40 MG PO TBEC
40.0000 mg | DELAYED_RELEASE_TABLET | Freq: Every day | ORAL | 3 refills | Status: AC
  Filled 2024-10-11: qty 90, 90d supply, fill #0

## 2024-10-11 NOTE — Progress Notes (Signed)
 Refill sent to [facility] for Pantoprazole 

## 2024-10-12 ENCOUNTER — Other Ambulatory Visit (HOSPITAL_BASED_OUTPATIENT_CLINIC_OR_DEPARTMENT_OTHER): Payer: Self-pay

## 2024-10-12 MED ORDER — FUROSEMIDE 40 MG PO TABS
40.0000 mg | ORAL_TABLET | Freq: Every day | ORAL | 5 refills | Status: AC
  Filled 2024-10-25: qty 30, 30d supply, fill #0

## 2024-10-12 MED ORDER — TIZANIDINE HCL 2 MG PO TABS: 2.0000 mg | ORAL_TABLET | Freq: Two times a day (BID) | ORAL | 2 refills | Status: DC | PRN

## 2024-10-12 MED ORDER — WARFARIN SODIUM 5 MG PO TABS: ORAL_TABLET | ORAL | 0 refills | Status: DC

## 2024-10-12 MED ORDER — FUROSEMIDE 40 MG PO TABS
40.0000 mg | ORAL_TABLET | Freq: Every day | ORAL | 11 refills | Status: DC
  Filled 2024-10-25: qty 30, 30d supply, fill #0

## 2024-10-12 MED ORDER — BENAZEPRIL HCL 20 MG PO TABS: 20.0000 mg | ORAL_TABLET | Freq: Every day | ORAL | 2 refills | Status: DC

## 2024-10-13 ENCOUNTER — Other Ambulatory Visit (HOSPITAL_BASED_OUTPATIENT_CLINIC_OR_DEPARTMENT_OTHER): Payer: Self-pay

## 2024-10-16 ENCOUNTER — Ambulatory Visit

## 2024-10-16 ENCOUNTER — Ambulatory Visit: Attending: Physician Assistant

## 2024-10-16 DIAGNOSIS — I480 Paroxysmal atrial fibrillation: Secondary | ICD-10-CM | POA: Diagnosis not present

## 2024-10-16 DIAGNOSIS — R001 Bradycardia, unspecified: Secondary | ICD-10-CM | POA: Diagnosis not present

## 2024-10-17 DIAGNOSIS — I471 Supraventricular tachycardia, unspecified: Secondary | ICD-10-CM | POA: Insufficient documentation

## 2024-10-23 ENCOUNTER — Ambulatory Visit: Attending: Internal Medicine | Admitting: *Deleted

## 2024-10-23 DIAGNOSIS — I4891 Unspecified atrial fibrillation: Secondary | ICD-10-CM

## 2024-10-23 DIAGNOSIS — Z5181 Encounter for therapeutic drug level monitoring: Secondary | ICD-10-CM | POA: Diagnosis not present

## 2024-10-23 DIAGNOSIS — Z8673 Personal history of transient ischemic attack (TIA), and cerebral infarction without residual deficits: Secondary | ICD-10-CM | POA: Diagnosis not present

## 2024-10-23 LAB — POCT INR: INR: 3.5 — AB (ref 2.0–3.0)

## 2024-10-23 NOTE — Progress Notes (Signed)
 INR 3.5; Please see anticoagulation encounter

## 2024-10-23 NOTE — Patient Instructions (Signed)
Hold warfarin tonight then resume 1 tablet daily except 1/2 tablet on Tuesdays and Fridays Recheck in 4 wks

## 2024-10-25 ENCOUNTER — Other Ambulatory Visit (HOSPITAL_BASED_OUTPATIENT_CLINIC_OR_DEPARTMENT_OTHER): Payer: Self-pay

## 2024-10-25 MED ORDER — LATANOPROST 0.005 % OP SOLN
1.0000 [drp] | Freq: Every day | OPHTHALMIC | 3 refills | Status: AC
  Filled 2024-10-25 – 2024-11-13 (×2): qty 5, 100d supply, fill #0

## 2024-10-25 MED ORDER — DORZOLAMIDE HCL-TIMOLOL MAL 2-0.5 % OP SOLN
OPHTHALMIC | 3 refills | Status: AC
  Filled 2024-10-25: qty 30, 100d supply, fill #0

## 2024-10-25 NOTE — Progress Notes (Signed)
 "  @Patient  ID: Mitchell Oliver, male    DOB: 11-Aug-1946, 78 y.o.   MRN: [id number]  Chief Complaint  Patient presents with   Consult    Doe consult     Referring provider: Lelon Glendia ONEIDA Oliver  HPI:   78 y.o. man whom are seen for evaluation of dyspnea on exertion.  Multiple cardiology notes reviewed.  Patient short of breath for some time.  Weeks to months.  Worse on inclines or stairs.  No time of day when the better or worse.  No position by things better or worse.  No seasonal environmental factors he can identify that make his but are worse.  As part of the evaluation he underwent nuclear medicine cardiac PET scan lab 08/2024 that showed concern for ischemia.  This prompted left and right heart catheterization that revealed no reasonable lesion but anywhere between 40 and 80% stenosis.  Right heart catheterization revealed right atrial pressure of 6, mean pressure of 41, LVEDP was 16 on left heart cath, preserved cardiac output and index with a PVR less than 3.  Notably, most recent chest imaging on the nuclear medicine cardiac PET scan revealed peripheral interlobular septal thickening concerning for edema versus early ILD on my review and interpretation.  We discussed all these findings.  How they contribute.  Additional workup including high-resolution CT scan to confirm or deny presence of ILD as well as PFTs to better understand his lung function.   Questionaires / Pulmonary Flowsheets:   ACT:      No data to display          MMRC:     No data to display          Epworth:      No data to display          Tests:   FENO:  No results found for: NITRICOXIDE  PFT:     No data to display          WALK:      No data to display          Imaging: Personally reviewed  Lab Results: Personally reviewed CBC    Component Value Date/Time   WBC 9.0 08/22/2024 1105   RBC 4.76 08/22/2024 1105   HGB 15.0 09/07/2024 1041   HGB 16.5 06/14/2024 1445    HCT 44.0 09/07/2024 1041   HCT 47.1 06/14/2024 1445   PLT 161 08/22/2024 1105   PLT 149 (L) 06/14/2024 1445   MCV 93.9 08/22/2024 1105   MCV 92 06/14/2024 1445   MCH 32.6 08/22/2024 1105   MCHC 34.7 08/22/2024 1105   RDW 16.1 (H) 08/22/2024 1105   RDW 13.5 06/14/2024 1445   LYMPHSABS 2.4 03/10/2024 0955   MONOABS 1.0 01/18/2024 1754   EOSABS 0.2 03/10/2024 0955   BASOSABS 0.1 03/10/2024 0955    BMET    Component Value Date/Time   NA 139 09/28/2024 1411   K 4.6 09/28/2024 1411   CL 102 09/28/2024 1411   CO2 20 09/28/2024 1411   GLUCOSE 107 (H) 09/28/2024 1411   GLUCOSE 164 (H) 08/22/2024 1105   BUN 10 09/28/2024 1411   CREATININE 1.29 (H) 09/28/2024 1411   CREATININE 1.11 07/03/2014 0814   CALCIUM  9.3 09/28/2024 1411   GFRNONAA 55 (L) 08/22/2024 1105   GFRAA 78 07/09/2020 0933    BNP    Component Value Date/Time   BNP 546.5 (H) 08/22/2024 1130    ProBNP No results found for: PROBNP  Specialty Problems       Pulmonary Problems   COPD (chronic obstructive pulmonary disease) (HCC)   Seen on CXR Sept 2016      DOE (dyspnea on exertion)   OSA (obstructive sleep apnea)    Allergies[1]  Immunization History  Administered Date(s) Administered   Fluad Quad(high Dose 65+) 07/09/2020, 08/19/2022   INFLUENZA, HIGH DOSE SEASONAL PF 08/08/2014, 07/25/2019, 07/18/2024   Influenza,inj,Quad PF,6+ Mos 07/03/2015, 07/06/2016, 07/15/2017, 07/19/2018   Influenza-Unspecified 08/23/2013, 07/22/2021   PNEUMOCOCCAL CONJUGATE-20 08/19/2022   Pneumococcal Conjugate-13 08/28/2015   Pneumococcal Polysaccharide-23 11/26/2012   Zoster Recombinant(Shingrix) 06/26/2017, 08/26/2017, 09/17/2017   Zoster, Live 12/14/2012    Past Medical History:  Diagnosis Date   Allergy    Ascites    Cirrhosis (HCC)    COPD (chronic obstructive pulmonary disease) (HCC)    CVA (cerebral vascular accident) (HCC) 10/26/2010   denies residual on 07/22/2015   ED (erectile dysfunction)     Family history of adverse reaction to anesthesia    think my mother had real bad headaches after anesthesia   GERD (gastroesophageal reflux disease)    Hypercholesterolemia    Hypertension    IFG (impaired fasting glucose)    NSTEMI (non-ST elevated myocardial infarction) (HCC)    Pneumonia ~ 2005    Tobacco History: Tobacco Use History[2] Counseling given: Not Answered   Continue to not smoke  Outpatient Encounter Medications as of 09/25/2024  Medication Sig   ACETAMINOPHEN  PO Take 650 mg by mouth every 6 (six) hours as needed for moderate pain (pain score 4-6) or mild pain (pain score 1-3).   benazepril  (LOTENSIN ) 20 MG tablet Take 1 tablet (20 mg total) by mouth daily.   dorzolamide -timolol  (COSOPT ) 22.3-6.8 MG/ML ophthalmic solution Place 1 drop into both eyes 2 (two) times daily.   empagliflozin  (JARDIANCE ) 10 MG TABS tablet Take 1 tablet (10 mg total) by mouth daily.   eplerenone  (INSPRA ) 50 MG tablet Take 1 tablet (50 mg total) by mouth 2 (two) times daily.   furosemide  (LASIX ) 40 MG tablet Take 1 tablet (40 mg total) by mouth daily.   ipratropium (ATROVENT ) 0.06 % nasal spray Place 2 sprays into both nostrils 4 (four) times daily.   Iron , Ferrous Sulfate , 325 (65 Fe) MG TABS Take 325 mg by mouth every Tuesday, Thursday, Saturday, and Sunday.   lactulose  (CHRONULAC ) 10 GM/15ML solution Take 30 mLs (20 g total) by mouth 3 (three) times daily.   latanoprost  (XALATAN ) 0.005 % ophthalmic solution Place 1 drop into both eyes at bedtime.   ondansetron  (ZOFRAN ) 4 MG tablet Take 1 tablet (4 mg total) by mouth every 6 (six) hours as needed for nausea.   OVER THE COUNTER MEDICATION Vit C 4000 daily.   Tiotropium Bromide-Olodaterol (STIOLTO RESPIMAT ) 2.5-2.5 MCG/ACT AERS Inhale 2 puffs into the lungs daily.   warfarin (COUMADIN ) 5 MG tablet Resume 09/07/2024 evening  TAKE 1 TABLET DAILY EXCEPT 1/2 TABLET ON TUESDAYS AND FRIDAYS OR AS DIRECTED BY COUMADIN  CLINIC   zinc  gluconate 50 MG  tablet Take 50 mg by mouth daily.   [DISCONTINUED] albuterol  (VENTOLIN  HFA) 108 (90 Base) MCG/ACT inhaler INHALE 2 PUFFS INTO THE LUNGS EVERY 6 HOURS AS NEEDED FOR WHEEZING OR SHORTNESS OF BREATH   [DISCONTINUED] pantoprazole  (PROTONIX ) 40 MG tablet Take 1 tablet (40 mg total) by mouth daily.   [DISCONTINUED] lactulose  (CHRONULAC ) 10 GM/15ML solution Take 30 mLs (20 g total) by mouth 2 (two) times daily. (Patient not taking: Reported on 08/30/2024)   No facility-administered  encounter medications on file as of 09/25/2024.     Review of Systems  Review of Systems  Comprehensive review systems negative unless mentioned in HPI above Physical Exam  BP 120/70   Pulse (!) 54   Temp (!) 97.2 F (36.2 C) (Oral)   Ht 6' 2 (1.88 m)   Wt 222 lb 9.6 oz (101 kg)   SpO2 95%   BMI 28.58 kg/m   Wt Readings from Last 5 Encounters:  09/25/24 222 lb 9.6 oz (101 kg)  09/07/24 213 lb (96.6 kg)  08/30/24 218 lb 11.2 oz (99.2 kg)  08/28/24 216 lb (98 kg)  08/22/24 211 lb 10.3 oz (96 kg)    BMI Readings from Last 5 Encounters:  09/25/24 28.58 kg/m  09/07/24 27.35 kg/m  08/30/24 28.08 kg/m  08/28/24 27.73 kg/m  08/22/24 27.17 kg/m     Physical Exam General: In a chair, no distress Eyes: EOMI Neck: No JVP Pulmonary: Clear, distant, no work of breathing Abdomen: Nondistended Cardiovascular: Regular rate and rhythm   Assessment & Plan:   Dyspnea on exertion: Suspect multifactorial large given by coronary disease, pulmonary hypertension largely driven by group 2 disease given prior hemodynamics, deconditioning, fluid imbalance with cirrhosis.  CT high-resolution for further evaluation given prior CT scan with interlobular septal thickening.  I suspect this may be related to fluid overload but better picture on CT high-resolution will be helpful.  In addition, PFTs ordered for further evaluation.  Trial Stiolto for symptomatic relief.  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  TTE in 2025, March, with mitral valve regurgitation.  Unlikely to benefit from further treatment.  No further treatment recommended at this time.  Continue close cardiology follow-up.   Return in about 2 months (around 11/26/2024) for f/u Dr. Annella.   Donnice JONELLE Annella, MD 10/25/2024     [1]  Allergies Allergen Reactions   Bee Venom Anaphylaxis   Shellfish Allergy Anaphylaxis    Scallops    Lidocaine  Hives and Rash    Lidocaine  Patch (rash) - only around the area where the patch was applied.  [2]  Social History Tobacco Use  Smoking Status Former   Current packs/day: 0.00   Average packs/day: 2.0 packs/day for 24.2 years (48.3 ttl pk-yrs)   Types: Cigarettes   Start date: 96   Quit date: 12/24/1985   Years since quitting: 38.8  Smokeless Tobacco Never   "

## 2024-10-27 ENCOUNTER — Encounter: Payer: Self-pay | Admitting: Student in an Organized Health Care Education/Training Program

## 2024-10-27 ENCOUNTER — Other Ambulatory Visit: Payer: Self-pay

## 2024-10-27 ENCOUNTER — Other Ambulatory Visit (HOSPITAL_BASED_OUTPATIENT_CLINIC_OR_DEPARTMENT_OTHER): Payer: Self-pay

## 2024-10-27 ENCOUNTER — Ambulatory Visit
Attending: Student in an Organized Health Care Education/Training Program | Admitting: Student in an Organized Health Care Education/Training Program

## 2024-10-27 VITALS — BP 128/72 | HR 62 | Ht 74.0 in | Wt 226.2 lb

## 2024-10-27 DIAGNOSIS — I471 Supraventricular tachycardia, unspecified: Secondary | ICD-10-CM

## 2024-10-27 NOTE — Progress Notes (Signed)
 " Cardiology Office Note   Date: 10/27/24 ID:  Mitchell Oliver, Mitchell Oliver 04-23-1946, MRN [id number] PCP: Bluford Jacqulyn MATSU, DO  [facility] Providers Cardiologist:  Lonni Cash, MD Cardiology APP:  Lelon Glendia DASEN, PA-C  Electrophysiologist:  Donnice DELENA Primus, MD    History of Present Illness Mitchell Oliver is a 79 y.o. male with symptomatic SVT, PACs, CAD, NSTEMI with pRPDA DES (06/2015), staged PCI with subsequent DES D1 (07/22/15), HFpEF, HTN, DM2, HLD, prior CVA, cirrhosis, OSA and COPD who presents for arrhythmia management and associated stroke risk evaluation.  Discussed the use of AI scribe software for clinical note transcription with the patient, who gave verbal consent to proceed.  History of Present Illness Mitchell Oliver is an 79 year old male who presents for evaluation of blood thinner use and potential atrial fibrillation. He is accompanied by his daughter, Mitchell Oliver. He was referred by Glendia Lelon for evaluation of blood thinner use and potential atrial fibrillation.  He has been experiencing issues related to blood thinner use, specifically Coumadin  (warfarin), which he has been taking due to concerns about atrial fibrillation (AFib). There is uncertainty about whether he actually has AFib, as previous telemetry and EMS reports suggested it, but subsequent evaluations have not confirmed it. He has experienced episodes of rapid heart rate and irregular rhythms, but these have not been definitively diagnosed as AFib.  He has a history of premature beats originating from the top chamber of the heart. These premature beats have been present for a long time, but he does not feel them.  He has a history of a stroke in 2012 while in [place], [place] , which was managed at a local hospital and followed up with a neurologist at [facility]. He has been on blood thinners since then, including Plavix  and currently Coumadin , but he reports issues with bleeding, such  as epistaxis, since starting these medications.  He has experienced episodes of being out of breath and rapid heart rate, which have raised questions about whether these symptoms are related to his heart or lungs. He has been evaluated by a pulmonologist to address these concerns.  He has a history of liver issues, with two sisters who died from fatty liver disease and a niece with the same condition. He is concerned about the impact of his liver condition on his overall health and medication use.  ROS: none  Studies Reviewed  ECG review 08/28/24: SB 43, QRS 88, QT/c 456/385 08/22/24: SB 57, QRS 86, QT/c 442/430 01/18/24: (18:44:55) NSR 72, PR 194, QRS 90, QT/c 404/443 01/18/24: (16:52:54) NSR 97, PR 191, QRS 93, QT/c 344/437 10/04/23: NSR 82, PR 180, QRS 93, QT/c 399/466 08/25/23: SB 53, PR 204, QRS 84, QT/c 438/410 07/23/15: NSR 61, PR 190, QRS 74, QT/c 414/416 07/22/15: NSR 62, PR 186, QRS 84, QT/c 422/428 07/19/15: NSR 65, PR 186, QRS 82, QT/c 410/426 07/18/15: NSR 82, PR 164, QRS 76, QT/c 376/439  07/16/15: NSR 78, PR 170, QRS 88, QT/c 384/437  BSCI MCT Result date: 09/13/24-10/12/24 HR 39-177, avg 62 PVCs < 1% NSVT, longest 4 s SVT 5 occurrences, longest 1 m 19 s 17% PACs No AF  RHC/LHC Result date: 09/07/24   Prox Cx to Dist Cx lesion is 60% stenosed.   Mid RCA lesion is 70% stenosed.   1st Diag lesion is 80% stenosed.   1st Mrg lesion is 65% stenosed.   Mid LAD lesion is 40% stenosed.   Non-stenotic Ost RPDA to RPDA  lesion was previously treated.  Zio monitor  Result date: 01/27/24-02/10/24 600 runs of nonsustained supraventricular tachycardia.   The fastest episode was 7 beats and the longest episode lasted 35 seconds  No evidence of atrial fib /  atrial flutter  14.9 % PAC burden   TTE Result date: 01/19/24  1. Limited study.   2. Left ventricular ejection fraction, by estimation, is 65 to 70%. The  left ventricle has normal function. The left ventricle  has no regional  wall motion abnormalities.   3. Right ventricular systolic function is normal. The right ventricular  size is normal.   4. The mitral valve is degenerative. Trivial mitral valve regurgitation.   5. The aortic valve is tricuspid. There is moderate calcification of the  aortic valve. Aortic valve sclerosis/calcification is present, without any  evidence of aortic stenosis. Aortic valve mean gradient measures 9.0 mmHg.   6. Aortic dilatation noted. There is mild dilatation of the aortic root,  measuring 42 mm.   Physical Exam VS:  BP 128/72 (BP Location: Left Arm, Patient Position: Sitting)   Pulse 62   Ht 6' 2 (1.88 m)   Wt 226 lb 3.2 oz (102.6 kg)   SpO2 94%   BMI 29.04 kg/m   Wt Readings from Last 3 Encounters:  10/27/24 226 lb 3.2 oz (102.6 kg)  09/25/24 222 lb 9.6 oz (101 kg)  09/07/24 213 lb (96.6 kg)    GEN: Well nourished, well developed in no acute distress NECK: No JVD; No carotid bruits CARDIAC: RRR, no murmurs, rubs, gallops RESPIRATORY:  Clear to auscultation without rales, wheezing or rhonchi  EXTREMITIES:  No edema; No deformity   ASSESSMENT AND PLAN Mitchell Oliver is a 79 y.o. male with symptomatic SVT, PACs, CAD, NSTEMI with pRPDA DES (06/2015), staged PCI with subsequent DES D1 (07/22/15), HFpEF, HTN, DM2, HLD, prior CVA, cirrhosis, OSA and COPD who presents for arrhythmia management and associated stroke risk evaluation. Assessment & Plan Evaluation of anticoagulation therapy in the context of arrhythmia risk No AFib evidence on recent monitoring or prior monitoring.  Previous rapid heart rate and premature beats likely SVT with PACs. Warfarin risks outweigh benefits due to bleeding risk from cirrhosis and stroke history.  I have reviewed all ECGs within our system as well as his prior monitors without any evidence of AF.  Although EMS had reported AF in the past and there was reportedly AF on telemetry monitor while he was admitted I have not  seen any evidence since then to confirm this.  With his cirrhosis/NASH I would consider Watchman to avoid long-term warfarin use if he had an indication for OAC.  Currently I do not see any clear indication for him to continue on warfarin.  I did offer for ILR implant if he or his daughter wanted continuous monitoring.  Right now they would like to avoid additional procedures which is reasonable since we have not seen AF yet on any monitors. - Discontinued warfarin therapy. - Monitor for AFib symptoms: rapid heart rate, palpitations. - Consider implantable loop recorder if AFib symptoms develop.  History of ischemic stroke Stroke in 2012. Current stroke risk without AFib similar to baseline due to cirrhosis and other health issues. Warfarin discontinuation not expected to increase stroke risk. - Monitor for new neurological symptoms or stroke signs.  Cirrhosis of liver Cirrhosis increases bleeding risk, complicating anticoagulation. No anticoagulation due to lack of AFib evidence and increased bleeding risk. - Monitor liver function and bleeding risk.  Atrial and supraventricular premature beats Premature beats noted, primarily atrial, not indicative of AFib or increased stroke risk. Common and may precede future AFib. - Monitor for rhythm changes or AFib symptoms.   Dispo: RTC 1 year in [place]   A total of 50 minutes was spent preparing for the patient, reviewing history, performing exam, document encounter, coordinating care and counseling the patient. 27 minutes was spent with direct patient care.   Donnice DELENA Primus, MD [facility]  Cardiac Electrophysiology  "

## 2024-10-27 NOTE — Patient Instructions (Signed)
 Medication Instructions:  Your physician has recommended you make the following change in your medication:   ** Stop Warfarin  *If you need a refill on your cardiac medications before your next appointment, please call your pharmacy*  Lab Work: None ordered.  If you have labs (blood work) drawn today and your tests are completely normal, you will receive your results only by: MyChart Message (if you have MyChart) OR A paper copy in the mail If you have any lab test that is abnormal or we need to change your treatment, we will call you to review the results.  Testing/Procedures: None ordered.   Follow-Up: At [facility], you and your health needs are our priority.  As part of our continuing mission to provide you with exceptional heart care, our providers are all part of one team.  This team includes your primary Cardiologist (physician) and Advanced Practice Providers or APPs (Physician Assistants and Nurse Practitioners) who all work together to provide you with the care you need, when you need it.  Your next appointment:   12 months in [place] office

## 2024-10-27 NOTE — Progress Notes (Deleted)
" °  Cardiology Office Note   Date:  10/27/2024  ID:  Aizik, Reh 1945/12/08, MRN [id number] PCP: Bluford Jacqulyn MATSU, DO  [facility] Providers Cardiologist:  Lonni Cash, MD Cardiology APP:  Lelon Glendia DASEN, PA-C { Click to update primary MD,subspecialty MD or APP then REFRESH:1}    History of Present Illness Mitchell Oliver is a 79 y.o. male ***  ROS: ***  Studies Reviewed      *** Risk Assessment/Calculations {Does this patient have ATRIAL FIBRILLATION?[phone]}     STOP-Bang Score:  5  { Consider Dx Sleep Disordered Breathing or Sleep Apnea  ICD G47.33          :1}    Physical Exam VS:  BP 128/72 (BP Location: Left Arm, Patient Position: Sitting)   Pulse 62   Ht 6' 2 (1.88 m)   Wt 226 lb 3.2 oz (102.6 kg)   SpO2 94%   BMI 29.04 kg/m        Wt Readings from Last 3 Encounters:  10/27/24 226 lb 3.2 oz (102.6 kg)  09/25/24 222 lb 9.6 oz (101 kg)  09/07/24 213 lb (96.6 kg)    GEN: Well nourished, well developed in no acute distress NECK: No JVD; No carotid bruits CARDIAC: ***RRR, no murmurs, rubs, gallops RESPIRATORY:  Clear to auscultation without rales, wheezing or rhonchi  ABDOMEN: Soft, non-tender, non-distended EXTREMITIES:  No edema; No deformity   ASSESSMENT AND PLAN ***    {Are you ordering a CV Procedure (e.g. stress test, cath, DCCV, TEE, etc)?   Press F2        :[id number]}  Dispo: ***  Signed, Mitchell DELENA Primus, MD  "

## 2024-10-28 NOTE — Addendum Note (Signed)
 Addended by: Gustabo Gordillo A on: 10/28/2024 08:34 PM   Modules accepted: Orders

## 2024-11-02 ENCOUNTER — Other Ambulatory Visit: Payer: Self-pay | Admitting: Family Medicine

## 2024-11-02 ENCOUNTER — Other Ambulatory Visit (HOSPITAL_BASED_OUTPATIENT_CLINIC_OR_DEPARTMENT_OTHER): Payer: Self-pay

## 2024-11-02 MED ORDER — EMPAGLIFLOZIN 10 MG PO TABS
10.0000 mg | ORAL_TABLET | Freq: Every day | ORAL | 3 refills | Status: AC
  Filled 2024-11-02: qty 90, 90d supply, fill #0

## 2024-11-02 MED ORDER — IRON (FERROUS SULFATE) 325 (65 FE) MG PO TABS
325.0000 mg | ORAL_TABLET | ORAL | 1 refills | Status: AC
  Filled 2024-11-02: qty 45, 78d supply, fill #0

## 2024-11-02 NOTE — Telephone Encounter (Signed)
 Copied from CRM [id number]. Topic: Clinical - Medication Refill >> Nov 02, 2024 11:35 AM Nathanel BROCKS wrote: Medication: Iron , Ferrous Sulfate , 325 (65 Fe) MG TABS empagliflozin  (JARDIANCE ) 10 MG TABS tablet  Has the patient contacted their pharmacy? No   This is the patient's preferred pharmacy:  [facility] [address] [place] [address] Phone: [phone] Fax: [phone]  Is this the correct pharmacy for this prescription? Yes If no, delete pharmacy and type the correct one.   Has the prescription been filled recently? Yes  Is the patient out of the medication? Yes (close on jardiance )  Has the patient been seen for an appointment in the last year OR does the patient have an upcoming appointment? Yes  Can we respond through MyChart? No  Agent: Please be advised that Rx refills may take up to 3 business days. We ask that you follow-up with your pharmacy.

## 2024-11-03 ENCOUNTER — Ambulatory Visit (HOSPITAL_COMMUNITY)
Admission: RE | Admit: 2024-11-03 | Discharge: 2024-11-03 | Disposition: A | Source: Ambulatory Visit | Attending: Pulmonary Disease | Admitting: Pulmonary Disease

## 2024-11-03 ENCOUNTER — Ambulatory Visit (HOSPITAL_COMMUNITY)

## 2024-11-03 ENCOUNTER — Encounter (HOSPITAL_COMMUNITY): Payer: Self-pay

## 2024-11-03 DIAGNOSIS — R0609 Other forms of dyspnea: Secondary | ICD-10-CM | POA: Diagnosis present

## 2024-11-03 DIAGNOSIS — I272 Pulmonary hypertension, unspecified: Secondary | ICD-10-CM | POA: Diagnosis present

## 2024-11-09 ENCOUNTER — Encounter: Payer: Self-pay | Admitting: Gastroenterology

## 2024-11-09 ENCOUNTER — Ambulatory Visit: Admitting: Gastroenterology

## 2024-11-09 VITALS — BP 136/73 | HR 60 | Temp 97.7°F | Ht 74.0 in | Wt 223.0 lb

## 2024-11-09 DIAGNOSIS — R188 Other ascites: Secondary | ICD-10-CM

## 2024-11-09 DIAGNOSIS — K219 Gastro-esophageal reflux disease without esophagitis: Secondary | ICD-10-CM

## 2024-11-09 DIAGNOSIS — K7460 Unspecified cirrhosis of liver: Secondary | ICD-10-CM | POA: Diagnosis not present

## 2024-11-09 NOTE — Progress Notes (Signed)
 "  GI Office Note    Referring Provider: Cook, Jayce G, DO Primary Care Physician:  Cook, Jayce G, DO Primary Gastroenterologist: Carlin POUR. Cindie, DO  Date:  11/09/2024  ID:  Mitchell Oliver, DOB 09/29/1946, MRN [id number]   Chief Complaint   Chief Complaint  Patient presents with   Follow-up    Checking on the swelling the he was having    History of Present Illness  Mitchell Oliver is a 79 y.o. male with a history of MASH cirrhosis, pneumonia, NSTEMI, HTN, HLD, GERD, CVA, and COPD with possibility now of pulmonary fibrosis and pulmonary artery hypertension.  Presenting today to discuss ascites and possible paracentesis.    Last office visit 09/19/24 with Dr. 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  He reports some fatigue and mental fog and was started on lactulose  previously which was helping though he is not consistently taking the medication, having a bowel movement 1-2 times daily.  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  He had some mild scleral icterus and abdominal distention at last visit.  Noted to be on eplerenone  50 mg twice daily and Lasix  40 mg daily.  Reinforced low-sodium diet.  Given he did not appear to be fluid overloaded on exam and he had an ischemia on PET stress test he discussed role of possible CT chest to further evaluate for pulmonary causes as well as given his possible fibrosis on PET stress test.  Underwent ultrasound paracentesis February 2025 with removal of 3 L of ascites.  He also underwent transcatheter biopsy of the liver at that time.  And had an elevated portosystemic gradient of 13 which was suggestive of portal hypertension.  Ultrasound abdomen June 2025: - Cirrhosis - Cholelithiasis without acute cholecystitis - Ascites noted - No focal liver lesion  Having swelling and shortness of breath. Had evaluation in March  with limited echocardiogram with an EF of 65 to 70% with moderate aortic valve calcification and trivial mitral valve regurgitation.  Mild aortic dilatation noted at the root.  Had pulmonary function test 09/25/2025 which is scheduled for 11/14/2024.  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 - Noted cirrhosis and moderate to large amount of ascites - Enlarged pulmonary trunk indicative of pulmonary arterial hypertension   Today:  Daughter accompanied him.   Discussed the use of AI scribe software for clinical note transcription with the patient, who gave verbal consent to proceed.  Cirrhosis is complicated by recurrent ascites, previously requiring two paracenteses with significant fluid removal. Diuretic therapy has included furosemide  (up to 40 mg), spironolactone  (up to 100 mg) which was discontinued due to mastodynia, and currently eplerenone  (50 mg twice daily), with dose adjustments due to renal function. A longstanding small abdominal ventral hernia is present and not permitted for elective repair.  Chronic shortness of breath persists without recent worsening. No increase in leg or foot swelling and abdominal distension is stable, not resembling prior episodes requiring paracentesis. Weight has remained stable at 223 lbs since early December, after previously being 213-218 in November. Rapid weight gain and fluid reaccumulation occurred after prior paracentesis. No recent increase in diuretic use.  Gastroesophageal reflux symptoms are well controlled on Protonix ; attempts to discontinue resulted in intolerable symptoms affecting sleep and appetite. No current  frequent coffee consumption, though previously drank a pot daily. Occasional audible gurgling occurs after drinking water . No confusion. Has had some jaundice  which has been present in the past even at last visit.   COPD is present. History of pulmonary fibrosis and pulmonary artery hypertension on most recent chest CT. Most recent labs from December 2025 showed creatinine 1.29-1.3, consistent with baseline.      Wt Readings from Last 6 Encounters:  11/09/24 223 lb (101.2 kg)  10/27/24 226 lb 3.2 oz (102.6 kg)  09/25/24 222 lb 9.6 oz (101 kg)  09/07/24 213 lb (96.6 kg)  08/30/24 218 lb 11.2 oz (99.2 kg)  08/28/24 216 lb (98 kg)    Body mass index is 28.63 kg/m.   Current Outpatient Medications  Medication Sig Dispense Refill   lactulose  (CHRONULAC ) 10 GM/15ML solution Take 30 mLs (20 g total) by mouth 3 (three) times daily. (Patient taking differently: Take 20 g by mouth 3 (three) times daily. PRN) 8100 mL 3   ACETAMINOPHEN  PO Take 650 mg by mouth every 6 (six) hours as needed for moderate pain (pain score 4-6) or mild pain (pain score 1-3).     albuterol  (VENTOLIN  HFA) 108 (90 Base) MCG/ACT inhaler Inhale 2 puffs into the lungs every 6 (six) hours as needed for wheezing or shortness of breath. 6.7 g 1   benazepril  (LOTENSIN ) 20 MG tablet Take 1 tablet (20 mg total) by mouth daily. 90 tablet 3   benazepril  (LOTENSIN ) 20 MG tablet Take 1 tablet (20 mg total) by mouth daily. 30 tablet 2   dorzolamide -timolol  (COSOPT ) 2-0.5 % ophthalmic solution INSTILL 1 DROP IN BOTH EYES THREE TIMES DAILY (Patient not taking: Reported on 10/27/2024) 40 mL 3   dorzolamide -timolol  (COSOPT ) 22.3-6.8 MG/ML ophthalmic solution Place 1 drop into both eyes 2 (two) times daily.     empagliflozin  (JARDIANCE ) 10 MG TABS tablet Take 1 tablet (10 mg total) by mouth daily. 90 tablet 3   eplerenone  (INSPRA ) 50 MG tablet Take 1 tablet (50 mg total) by mouth 2 (two) times daily. 60 tablet 11   furosemide  (LASIX ) 40 MG tablet Take 1 tablet (40 mg total) by mouth daily. 30 tablet 11   furosemide  (LASIX ) 40 MG tablet Take 1 tablet (40 mg total) by mouth daily. 30 tablet 5    furosemide  (LASIX ) 40 MG tablet Take 1 tablet (40 mg total) by mouth daily. 30 tablet 11   ipratropium (ATROVENT ) 0.06 % nasal spray Place 2 sprays into both nostrils 4 (four) times daily. (Patient not taking: Reported on 10/27/2024) 15 mL 0   Iron , Ferrous Sulfate , 325 (65 Fe) MG TABS Take 325 mg by mouth every Tuesday, Thursday, Saturday, and Sunday. 45 tablet 1   latanoprost  (XALATAN ) 0.005 % ophthalmic solution Place 1 drop into both eyes at bedtime.     latanoprost  (XALATAN ) 0.005 % ophthalmic solution Place 1 drop into both eyes at bedtime. 7.5 mL 3   ondansetron  (ZOFRAN ) 4 MG tablet Take 1 tablet (4 mg total) by mouth every 6 (six) hours as needed for nausea. 20 tablet 0   OVER THE COUNTER MEDICATION Vit C 4000 daily.     pantoprazole  (PROTONIX ) 40 MG tablet Take 1 tablet (40 mg total) by mouth daily. 90 tablet 3   Tiotropium Bromide-Olodaterol (STIOLTO RESPIMAT ) 2.5-2.5 MCG/ACT AERS Inhale 2 puffs into the lungs daily. 4 g 2   tiZANidine  (ZANAFLEX ) 2 MG tablet Take 1 tablet (2 mg total) by mouth 2 (two) times daily as needed  for spasm (Patient not taking: Reported on 10/27/2024) 60 tablet 2   zinc  gluconate 50 MG tablet Take 50 mg by mouth daily.     No current facility-administered medications for this visit.    Past Medical History:  Diagnosis Date   Allergy    Ascites    Cirrhosis (HCC)    COPD (chronic obstructive pulmonary disease) (HCC)    CVA (cerebral vascular accident) (HCC) 10/26/2010   denies residual on 07/22/2015   ED (erectile dysfunction)    Family history of adverse reaction to anesthesia    think my mother had real bad headaches after anesthesia   GERD (gastroesophageal reflux disease)    Hypercholesterolemia    Hypertension    IFG (impaired fasting glucose)    NSTEMI (non-ST elevated myocardial infarction) (HCC)    Pneumonia ~ 2005    Past Surgical History:  Procedure Laterality Date   APPENDECTOMY  2009 [facility]   BACK SURGERY     BIOPSY  05/04/2014    Procedure: BIOPSY;  Surgeon: Margo LITTIE Haddock, MD;  [facility];  Service: Endoscopy;;   CARDIAC CATHETERIZATION N/A 07/18/2015   Procedure: Left Heart Cath and Coronary Angiography;  Surgeon: Victory LELON Sharps, MD;  Location: [facility] INVASIVE CV LAB;  Service: Cardiovascular;  Laterality: N/A;   CARDIAC CATHETERIZATION N/A 07/18/2015   Procedure: Coronary Stent Intervention;  Surgeon: Victory LELON Sharps, MD;  Location: [facility] INVASIVE CV LAB;  Service: Cardiovascular;  Laterality: N/A;   CARDIAC CATHETERIZATION N/A 07/22/2015   Procedure: Coronary Stent Intervention;  Surgeon: Victory LELON Sharps, MD;  Location: [facility] INVAS[facility] CV LAB;  Service: Cardiovascular;  Laterality: N/A;   COLONOSCOPY  2009   COLONOSCOPY N/A 08/04/2019   Procedure: COLONOSCOPY;  Surgeon: Haddock Margo LITTIE, MD;  Location: [facility];  Service: Endoscopy;  Laterality: N/A;  12:30   CORONARY PRESSURE/FFR WITH 3D MAPPING N/A 09/07/2024   Procedure: Coronary Pressure/FFR w/3D Mapping;  Surgeon: Elmira Newman PARAS, MD;  Location: [facility] INVASIVE CV LAB;  Service: Cardiovascular;  Laterality: N/A;   ESOPHAGOGASTRODUODENOSCOPY N/A 05/04/2014   Procedure: ESOPHAGOGASTRODUODENOSCOPY (EGD);  Surgeon: Margo LITTIE Haddock, MD;  Location: [facility];  Service: Endoscopy;  Laterality: N/A;  9:15   ESOPHAGOGASTRODUODENOSCOPY N/A 01/21/2024   Procedure: EGD (ESOPHAGOGASTRODUODENOSCOPY);  Surgeon: Cindie Carlin POUR, DO;  Location: [facility];  Service: Endoscopy;  Laterality: N/A;  11:45AM, ASA 3   IR PARACENTESIS  12/02/2023   IR TRANSCATHETER BX  12/02/2023   POLYPECTOMY  08/04/2019   Procedure: POLYPECTOMY;  Surgeon: Haddock Margo LITTIE, MD;  Location: [facility];  Service: Endoscopy;;  hepatic flexure, ascending colon, transverse colon, splenic flexure, descending colon    POSTERIOR LAMINECTOMY / DECOMPRESSION LUMBAR SPINE  06/03/2015   RIGHT/LEFT HEART CATH AND CORONARY ANGIOGRAPHY N/A 09/07/2024   Procedure: RIGHT/LEFT HEART CATH AND CORONARY ANGIOGRAPHY;   Surgeon: Elmira Newman PARAS, MD;  Location: [facility] INVASIVE CV LAB;  Service: Cardiovascular;  Laterality: N/A;   SAVORY DILATION N/A 05/04/2014   Procedure: SAVORY DILATION;  Surgeon: Margo LITTIE Haddock, MD;  Location: [facility];  Service: Endoscopy;  Laterality: N/A;    Family History  Problem Relation Age of Onset   Hypertension Mother    Hypertension Father    Heart attack Father    Stroke Father    Colon cancer Neg Hx    Colon polyps Neg Hx     Allergies as of 11/09/2024 - Review Complete 11/09/2024  Allergen Reaction Noted   Bee venom Anaphylaxis 12/10/2023  Shellfish allergy Anaphylaxis 12/10/2023   Lidocaine  Hives and Rash 10/04/2023    Social History   Socioeconomic History   Marital status: Single    Spouse name: Not on file   Number of children: 3   Years of education: Not on file   Highest education level: Some college, no degree  Occupational History   Occupation: Airline Pilot  Tobacco Use   Smoking status: Former    Current packs/day: 0.00    Average packs/day: 2.0 packs/day for 24.2 years (48.3 ttl pk-yrs)    Types: Cigarettes    Start date: 1963    Quit date: 12/24/1985    Years since quitting: 38.9   Smokeless tobacco: Never  Vaping Use   Vaping status: Never Used  Substance and Sexual Activity   Alcohol use: Yes    Alcohol/week: 6.0 standard drinks of alcohol    Types: 2 Glasses of wine, 4 Cans of beer per week   Drug use: No   Sexual activity: Yes  Other Topics Concern   Not on file  Social History Narrative   3 daughters, 2 live locally, one lives in [place].   3 grandsons   Active in grandson's travel ball. Works part time for [employer].    Social Drivers of Health   Tobacco Use: Medium Risk (11/09/2024)   Patient History    Smoking Tobacco Use: Former    Smokeless Tobacco Use: Never    Passive Exposure: Not on file  Financial Resource Strain: Low Risk (08/11/2024)   Overall Financial Resource Strain (CARDIA)    Difficulty of  Paying Living Expenses: Not hard at all  Food Insecurity: No Food Insecurity (08/11/2024)   Epic    Worried About Programme Researcher, Broadcasting/film/video in the Last Year: Never true    Ran Out of Food in the Last Year: Never true  Transportation Needs: No Transportation Needs (08/11/2024)   Epic    Lack of Transportation (Medical): No    Lack of Transportation (Non-Medical): No  Physical Activity: Inactive (08/11/2024)   Exercise Vital Sign    Days of Exercise per Week: 0 days    Minutes of Exercise per Session: 0 min  Stress: No Stress Concern Present (08/11/2024)   [employer] of Occupational Health - Occupational Stress Questionnaire    Feeling of Stress: Not at all  Social Connections: Moderately Integrated (08/11/2024)   Social Connection and Isolation Panel    Frequency of Communication with Friends and Family: More than three times a week    Frequency of Social Gatherings with Friends and Family: Twice a week    Attends Religious Services: More than 4 times per year    Active Member of Clubs or Organizations: Yes    Attends Banker Meetings: More than 4 times per year    Marital Status: Divorced  Depression (PHQ2-9): Medium Risk (08/11/2024)   Depression (PHQ2-9)    PHQ-2 Score: 6  Alcohol Screen: Low Risk (08/11/2024)   Alcohol Screen    Last Alcohol Screening Score (AUDIT): 0  Housing: Unknown (08/11/2024)   Epic    Unable to Pay for Housing in the Last Year: No    Number of Times Moved in the Last Year: Not on file    Homeless in the Last Year: No  Utilities: Not At Risk (08/11/2024)   Epic    Threatened with loss of utilities: No  Health Literacy: Adequate Health Literacy (08/11/2024)   B1300 Health Literacy    Frequency of need  for help with medical instructions: Never    Review of Systems   Gen: Denies fever, chills, anorexia. Denies fatigue, weakness, weight loss.  CV: Denies chest pain, palpitations, syncope, peripheral edema, and claudication. Resp:  Denies dyspnea at rest, cough, wheezing, coughing up blood, and pleurisy. GI: See HPI Derm: Denies rash, itching, dry skin Psych: Denies depression, anxiety, memory loss, confusion. No homicidal or suicidal ideation.  Heme: Denies bruising, bleeding, and enlarged lymph nodes.  Physical Exam   BP 136/73 (BP Location: Right Arm, Patient Position: Sitting, Cuff Size: Large)   Pulse 60   Temp 97.7 F (36.5 C) (Temporal)   Ht 6' 2 (1.88 m)   Wt 223 lb (101.2 kg)   BMI 28.63 kg/m   General:   Alert and oriented. No distress noted. Pleasant and cooperative.  Head:  Normocephalic and atraumatic. Eyes:  Mild icterus. Conjuctiva clear without scleral icterus. Mouth:  Oral mucosa pink and moist. Good dentition. No lesions. Lungs:  Clear to auscultation bilaterally. No wheezes, rales, or rhonchi. No distress.  Heart:  S1, S2 present without murmurs appreciated.  Abdomen:  +BS, soft, non-tender, non-distended. Evidence of ventral hernia. Mild fluid wave. Not taut. No rebound or guarding. No HSM or masses noted. Mild jaundice.  Rectal: deferred Msk:  Symmetrical without gross deformities. Normal posture. Extremities:  mild pitting edema from sock line Neurologic:  Alert and oriented x4 Psych:  Alert and cooperative. Normal mood and affect.  Assessment & Plan  Mitchell Oliver is a 79 y.o. male presenting today with concerns about ascites.      Cirrhosis with ascites Chronic condition with moderate to large ascites noted on recent imaging, mild peripheral edema, and stable weight since early November. No evidence of acute fluid overload on exam today as abdomen not overtly distended, tender, or taut and very soft. There was a small fluid wave noted but given weight has been stable for the last 6 weeks and no overt shortness of breath, distention, or pain risks outweigh benefits at this time. Recommended to continue diuretic regimen and adhere to low sodium diet. - Continued eplerenone  50 mg BID  and lasix  40 mg daily.  - Advised monitoring for increased peripheral edema and instructed to report weight gain of 5-7 pounds within short time window for reassessment and consideration of US  ascites - Will consider updated laboratory studies or diuretic adjustment after discussion with Dr. Cindie if clinically indicated. - Advised follow-up with pulmonology and completion of scheduled pulmonary function tests. - Will communicate findings to Dr. Cindie and update plan as needed after collaboration. - Full cirrhosis follow up in 4 weeks.   Gastroesophageal reflux disease Chronic condition well controlled on Protonix . Attempts to discontinue resulted in significant symptoms affecting sleep and appetite. Long-term therapy justified due to improvement in quality of life. - Continued Protonix  as previously prescribed. - Advised monitoring for worsening reflux symptoms. - Provided reassurance regarding long-term Protonix  use given benefit in symptom control and quality of life.     Follow up   Follow up as scheduled 12/07/24 with Dr. Cindie.  Will order US  ascites if large increase in weight prior to follow up.   Charmaine Melia, MSN, FNP-BC, AGACNP-BC [facility] "

## 2024-11-09 NOTE — Patient Instructions (Addendum)
 Given the risk of infection with paracentesis I do not think we should proceed at this time given the abdomen is not super distended or firm.  Please keep follow-up with pulmonology and PFTs in the near future.  If you have a significant amount of weight gain between now and your follow-up with Dr. Cindie then please call the office and let us  know and at the very least we can order an updated ultrasound to look for any extra fluid to see if there is enough that warrants paracentesis.  Continue taking your diuretics without any changes currently.  After I speak with Dr. Cindie I will let you know if we need to update anything however given your creatinine last month was around about your baseline and slightly elevated I do not recommend increasing diuretics at this time.  It was an absolute pleasure to meet you today!  It was a pleasure to see you today. I want to create trusting relationships with patients. If you receive a survey regarding your visit,  I greatly appreciate you taking time to fill this out on paper or through your MyChart. I value your feedback.  Charmaine Melia, MSN, FNP-BC, AGACNP-BC [facility]

## 2024-11-13 ENCOUNTER — Other Ambulatory Visit (HOSPITAL_BASED_OUTPATIENT_CLINIC_OR_DEPARTMENT_OTHER): Payer: Self-pay

## 2024-11-14 ENCOUNTER — Ambulatory Visit (HOSPITAL_COMMUNITY)
Admission: RE | Admit: 2024-11-14 | Discharge: 2024-11-14 | Disposition: A | Discharge status: Home / Self Care | Source: Ambulatory Visit | Attending: Pulmonary Disease | Admitting: Pulmonary Disease

## 2024-11-14 DIAGNOSIS — R0609 Other forms of dyspnea: Secondary | ICD-10-CM | POA: Diagnosis present

## 2024-11-14 LAB — PULMONARY FUNCTION TEST
DL/VA % pred: 56 %
DL/VA: 2.15 ml/min/mmHg/L
DLCO unc % pred: 34 %
DLCO unc: 9.52 ml/min/mmHg
FEF 25-75 Post: 0.71 L/s
FEF 25-75 Pre: 1.92 L/s
FEF2575-%Change-Post: -63 %
FEF2575-%Pred-Post: 30 %
FEF2575-%Pred-Pre: 81 %
FEV1-%Change-Post: -15 %
FEV1-%Pred-Post: 67 %
FEV1-%Pred-Pre: 79 %
FEV1-Post: 2.28 L
FEV1-Pre: 2.7 L
FEV1FVC-%Change-Post: -9 %
FEV1FVC-%Pred-Pre: 101 %
FEV6-%Change-Post: -8 %
FEV6-%Pred-Post: 75 %
FEV6-%Pred-Pre: 82 %
FEV6-Post: 3.33 L
FEV6-Pre: 3.66 L
FEV6FVC-%Change-Post: -2 %
FEV6FVC-%Pred-Post: 102 %
FEV6FVC-%Pred-Pre: 105 %
FVC-%Change-Post: -6 %
FVC-%Pred-Post: 73 %
FVC-%Pred-Pre: 78 %
FVC-Post: 3.45 L
FVC-Pre: 3.71 L
Post FEV1/FVC ratio: 66 %
Post FEV6/FVC ratio: 96 %
Pre FEV1/FVC ratio: 73 %
Pre FEV6/FVC Ratio: 99 %
RV % pred: 75 %
RV: 2.16 L
TLC % pred: 72 %
TLC: 5.72 L

## 2024-11-14 MED ORDER — ALBUTEROL SULFATE (2.5 MG/3ML) 0.083% IN NEBU
2.5000 mg | INHALATION_SOLUTION | Freq: Once | RESPIRATORY_TRACT | Status: AC
  Administered 2024-11-14: 2.5 mg via RESPIRATORY_TRACT

## 2024-11-17 ENCOUNTER — Encounter: Payer: Self-pay | Admitting: Cardiovascular Disease

## 2024-11-22 ENCOUNTER — Ambulatory Visit

## 2024-11-23 ENCOUNTER — Telehealth: Payer: Self-pay

## 2024-11-23 DIAGNOSIS — I77819 Aortic ectasia, unspecified site: Secondary | ICD-10-CM

## 2024-11-23 NOTE — Telephone Encounter (Signed)
 CT angio ordered 1/29

## 2024-11-23 NOTE — Telephone Encounter (Signed)
-----   Message from Laymon LITTIE Quivers sent at 11/22/2024 12:08 PM EST ----- Regarding: ct scan Hey,  This patient granddaughter is calling to reschedule his ct scan due to th weather. The order expire this week. Can you place a new order so I can get him rescheduled for next week.  Thanks, Brittany

## 2024-11-24 ENCOUNTER — Ambulatory Visit (HOSPITAL_COMMUNITY)

## 2024-11-27 ENCOUNTER — Encounter

## 2024-11-27 ENCOUNTER — Ambulatory Visit: Admitting: Pulmonary Disease

## 2024-11-30 ENCOUNTER — Ambulatory Visit: Admitting: Internal Medicine

## 2024-12-04 ENCOUNTER — Ambulatory Visit (HOSPITAL_COMMUNITY)

## 2024-12-06 ENCOUNTER — Ambulatory Visit: Admitting: Cardiovascular Disease

## 2024-12-07 ENCOUNTER — Ambulatory Visit: Admitting: Internal Medicine

## 2024-12-29 ENCOUNTER — Ambulatory Visit: Admitting: Pulmonary Disease

## 2025-08-17 ENCOUNTER — Ambulatory Visit
